# Patient Record
Sex: Female | Born: 1945 | Race: White | Hispanic: No | Marital: Married | Smoking: Never smoker
Health system: Southern US, Community
[De-identification: ages and names within clinical notes are randomized; demographics above are authoritative.]

## PROBLEM LIST (undated history)

## (undated) DIAGNOSIS — Z889 Allergy status to unspecified drugs, medicaments and biological substances status: Secondary | ICD-10-CM

## (undated) DIAGNOSIS — Z8489 Family history of other specified conditions: Secondary | ICD-10-CM

## (undated) DIAGNOSIS — K5792 Diverticulitis of intestine, part unspecified, without perforation or abscess without bleeding: Secondary | ICD-10-CM

## (undated) DIAGNOSIS — K7581 Nonalcoholic steatohepatitis (NASH): Secondary | ICD-10-CM

## (undated) DIAGNOSIS — K766 Portal hypertension: Secondary | ICD-10-CM

## (undated) DIAGNOSIS — I639 Cerebral infarction, unspecified: Secondary | ICD-10-CM

## (undated) DIAGNOSIS — M1990 Unspecified osteoarthritis, unspecified site: Secondary | ICD-10-CM

## (undated) DIAGNOSIS — J189 Pneumonia, unspecified organism: Secondary | ICD-10-CM

## (undated) DIAGNOSIS — Z9889 Other specified postprocedural states: Secondary | ICD-10-CM

## (undated) DIAGNOSIS — R112 Nausea with vomiting, unspecified: Secondary | ICD-10-CM

## (undated) DIAGNOSIS — I864 Gastric varices: Secondary | ICD-10-CM

## (undated) DIAGNOSIS — IMO0001 Reserved for inherently not codable concepts without codable children: Secondary | ICD-10-CM

## (undated) DIAGNOSIS — G459 Transient cerebral ischemic attack, unspecified: Secondary | ICD-10-CM

## (undated) DIAGNOSIS — K900 Celiac disease: Secondary | ICD-10-CM

## (undated) DIAGNOSIS — D759 Disease of blood and blood-forming organs, unspecified: Secondary | ICD-10-CM

## (undated) DIAGNOSIS — I209 Angina pectoris, unspecified: Secondary | ICD-10-CM

## (undated) DIAGNOSIS — I82409 Acute embolism and thrombosis of unspecified deep veins of unspecified lower extremity: Secondary | ICD-10-CM

## (undated) DIAGNOSIS — I8390 Asymptomatic varicose veins of unspecified lower extremity: Secondary | ICD-10-CM

## (undated) DIAGNOSIS — R011 Cardiac murmur, unspecified: Secondary | ICD-10-CM

## (undated) DIAGNOSIS — I8500 Esophageal varices without bleeding: Secondary | ICD-10-CM

## (undated) DIAGNOSIS — F419 Anxiety disorder, unspecified: Secondary | ICD-10-CM

## (undated) DIAGNOSIS — Z5189 Encounter for other specified aftercare: Secondary | ICD-10-CM

## (undated) DIAGNOSIS — I10 Essential (primary) hypertension: Secondary | ICD-10-CM

## (undated) DIAGNOSIS — K219 Gastro-esophageal reflux disease without esophagitis: Secondary | ICD-10-CM

## (undated) DIAGNOSIS — M797 Fibromyalgia: Secondary | ICD-10-CM

## (undated) DIAGNOSIS — D6861 Antiphospholipid syndrome: Secondary | ICD-10-CM

## (undated) DIAGNOSIS — F40240 Claustrophobia: Secondary | ICD-10-CM

## (undated) HISTORY — DX: Esophageal varices without bleeding: I85.00

## (undated) HISTORY — DX: Asymptomatic varicose veins of unspecified lower extremity: I83.90

## (undated) HISTORY — DX: Fibromyalgia: M79.7

## (undated) HISTORY — PX: COLON SURGERY: SHX602

## (undated) HISTORY — DX: Acute embolism and thrombosis of unspecified deep veins of unspecified lower extremity: I82.409

## (undated) HISTORY — DX: Portal hypertension: K76.6

## (undated) HISTORY — DX: Nonalcoholic steatohepatitis (NASH): K75.81

## (undated) HISTORY — PX: TONSILLECTOMY: SUR1361

## (undated) HISTORY — PX: ANKLE FRACTURE SURGERY: SHX122

## (undated) HISTORY — DX: Celiac disease: K90.0

## (undated) HISTORY — PX: CHOLECYSTECTOMY: SHX55

## (undated) HISTORY — DX: Antiphospholipid syndrome: D68.61

## (undated) HISTORY — PX: HERNIA REPAIR: SHX51

## (undated) HISTORY — PX: ABDOMINAL HYSTERECTOMY: SHX81

## (undated) HISTORY — PX: SPLENECTOMY, TOTAL: SHX788

## (undated) HISTORY — DX: Gastric varices: I86.4

---

## 1999-06-21 ENCOUNTER — Ambulatory Visit (HOSPITAL_COMMUNITY): Admission: RE | Admit: 1999-06-21 | Discharge: 1999-06-21 | Payer: Self-pay | Admitting: Gastroenterology

## 1999-09-20 ENCOUNTER — Encounter: Payer: Self-pay | Admitting: Hematology & Oncology

## 1999-09-20 ENCOUNTER — Encounter: Admission: RE | Admit: 1999-09-20 | Discharge: 1999-09-20 | Payer: Self-pay | Admitting: Hematology & Oncology

## 1999-09-23 ENCOUNTER — Ambulatory Visit (HOSPITAL_COMMUNITY): Admission: RE | Admit: 1999-09-23 | Discharge: 1999-09-23 | Payer: Self-pay | Admitting: Hematology & Oncology

## 1999-09-23 ENCOUNTER — Encounter (INDEPENDENT_AMBULATORY_CARE_PROVIDER_SITE_OTHER): Payer: Self-pay | Admitting: Specialist

## 1999-10-10 ENCOUNTER — Encounter: Payer: Self-pay | Admitting: Gastroenterology

## 1999-10-10 ENCOUNTER — Ambulatory Visit (HOSPITAL_COMMUNITY): Admission: RE | Admit: 1999-10-10 | Discharge: 1999-10-10 | Payer: Self-pay | Admitting: Gastroenterology

## 1999-10-25 ENCOUNTER — Ambulatory Visit (HOSPITAL_COMMUNITY): Admission: RE | Admit: 1999-10-25 | Discharge: 1999-10-25 | Payer: Self-pay | Admitting: Gastroenterology

## 1999-10-25 ENCOUNTER — Encounter: Payer: Self-pay | Admitting: Gastroenterology

## 1999-10-27 ENCOUNTER — Ambulatory Visit (HOSPITAL_COMMUNITY): Admission: RE | Admit: 1999-10-27 | Discharge: 1999-10-27 | Payer: Self-pay | Admitting: Gastroenterology

## 1999-10-27 ENCOUNTER — Encounter: Payer: Self-pay | Admitting: Gastroenterology

## 1999-10-27 ENCOUNTER — Encounter (INDEPENDENT_AMBULATORY_CARE_PROVIDER_SITE_OTHER): Payer: Self-pay | Admitting: *Deleted

## 1999-12-11 ENCOUNTER — Ambulatory Visit (HOSPITAL_COMMUNITY): Admission: RE | Admit: 1999-12-11 | Discharge: 1999-12-11 | Payer: Self-pay | Admitting: Hematology & Oncology

## 1999-12-13 ENCOUNTER — Encounter: Payer: Self-pay | Admitting: Hematology & Oncology

## 1999-12-13 ENCOUNTER — Encounter: Admission: RE | Admit: 1999-12-13 | Discharge: 1999-12-13 | Payer: Self-pay | Admitting: Hematology & Oncology

## 1999-12-18 ENCOUNTER — Ambulatory Visit (HOSPITAL_COMMUNITY): Admission: EM | Admit: 1999-12-18 | Discharge: 1999-12-18 | Payer: Self-pay

## 1999-12-19 ENCOUNTER — Ambulatory Visit (HOSPITAL_COMMUNITY): Admission: RE | Admit: 1999-12-19 | Discharge: 1999-12-19 | Payer: Self-pay | Admitting: Hematology & Oncology

## 1999-12-20 ENCOUNTER — Encounter: Admission: RE | Admit: 1999-12-20 | Discharge: 1999-12-20 | Payer: Self-pay | Admitting: Oncology

## 1999-12-20 ENCOUNTER — Encounter: Payer: Self-pay | Admitting: Hematology & Oncology

## 1999-12-22 ENCOUNTER — Ambulatory Visit: Admission: RE | Admit: 1999-12-22 | Discharge: 1999-12-22 | Payer: Self-pay | Admitting: Hematology & Oncology

## 1999-12-23 ENCOUNTER — Ambulatory Visit (HOSPITAL_COMMUNITY): Admission: RE | Admit: 1999-12-23 | Discharge: 1999-12-23 | Payer: Self-pay | Admitting: Hematology & Oncology

## 1999-12-23 ENCOUNTER — Encounter: Payer: Self-pay | Admitting: Hematology & Oncology

## 2000-01-27 ENCOUNTER — Encounter: Payer: Self-pay | Admitting: Gastroenterology

## 2000-01-27 ENCOUNTER — Ambulatory Visit (HOSPITAL_COMMUNITY): Admission: RE | Admit: 2000-01-27 | Discharge: 2000-01-27 | Payer: Self-pay | Admitting: Gastroenterology

## 2000-02-02 ENCOUNTER — Encounter: Payer: Self-pay | Admitting: Hematology & Oncology

## 2000-02-02 ENCOUNTER — Encounter: Admission: RE | Admit: 2000-02-02 | Discharge: 2000-02-02 | Payer: Self-pay | Admitting: Hematology & Oncology

## 2000-07-26 ENCOUNTER — Encounter: Payer: Self-pay | Admitting: Hematology & Oncology

## 2000-07-26 ENCOUNTER — Encounter: Admission: RE | Admit: 2000-07-26 | Discharge: 2000-07-26 | Payer: Self-pay | Admitting: Hematology & Oncology

## 2000-10-29 ENCOUNTER — Encounter: Payer: Self-pay | Admitting: Hematology & Oncology

## 2000-10-29 ENCOUNTER — Encounter: Admission: RE | Admit: 2000-10-29 | Discharge: 2000-10-29 | Payer: Self-pay | Admitting: Hematology & Oncology

## 2000-11-08 ENCOUNTER — Encounter: Admission: RE | Admit: 2000-11-08 | Discharge: 2000-11-08 | Payer: Self-pay | Admitting: Hematology & Oncology

## 2000-11-08 ENCOUNTER — Encounter: Payer: Self-pay | Admitting: Hematology & Oncology

## 2000-12-04 ENCOUNTER — Encounter: Payer: Self-pay | Admitting: Emergency Medicine

## 2000-12-04 ENCOUNTER — Emergency Department (HOSPITAL_COMMUNITY): Admission: EM | Admit: 2000-12-04 | Discharge: 2000-12-04 | Payer: Self-pay | Admitting: Emergency Medicine

## 2002-03-26 ENCOUNTER — Other Ambulatory Visit: Admission: RE | Admit: 2002-03-26 | Discharge: 2002-03-26 | Payer: Self-pay | Admitting: Obstetrics and Gynecology

## 2002-06-11 ENCOUNTER — Encounter: Payer: Self-pay | Admitting: Gastroenterology

## 2002-06-11 ENCOUNTER — Encounter: Admission: RE | Admit: 2002-06-11 | Discharge: 2002-06-11 | Payer: Self-pay | Admitting: Gastroenterology

## 2003-02-13 ENCOUNTER — Encounter: Admission: RE | Admit: 2003-02-13 | Discharge: 2003-02-13 | Payer: Self-pay | Admitting: Gastroenterology

## 2003-03-04 ENCOUNTER — Ambulatory Visit (HOSPITAL_COMMUNITY): Admission: RE | Admit: 2003-03-04 | Discharge: 2003-03-04 | Payer: Self-pay | Admitting: Hematology & Oncology

## 2003-11-20 ENCOUNTER — Ambulatory Visit (HOSPITAL_COMMUNITY): Admission: RE | Admit: 2003-11-20 | Discharge: 2003-11-20 | Payer: Self-pay | Admitting: Hematology & Oncology

## 2003-12-17 ENCOUNTER — Ambulatory Visit (HOSPITAL_COMMUNITY): Admission: RE | Admit: 2003-12-17 | Discharge: 2003-12-17 | Payer: Self-pay | Admitting: General Surgery

## 2004-01-22 ENCOUNTER — Inpatient Hospital Stay (HOSPITAL_COMMUNITY): Admission: RE | Admit: 2004-01-22 | Discharge: 2004-01-28 | Payer: Self-pay | Admitting: General Surgery

## 2004-01-22 ENCOUNTER — Encounter (INDEPENDENT_AMBULATORY_CARE_PROVIDER_SITE_OTHER): Payer: Self-pay | Admitting: Specialist

## 2004-02-18 ENCOUNTER — Ambulatory Visit: Payer: Self-pay | Admitting: Hematology & Oncology

## 2004-03-04 ENCOUNTER — Ambulatory Visit (HOSPITAL_BASED_OUTPATIENT_CLINIC_OR_DEPARTMENT_OTHER): Admission: RE | Admit: 2004-03-04 | Discharge: 2004-03-04 | Payer: Self-pay | Admitting: Orthopedic Surgery

## 2004-03-04 ENCOUNTER — Ambulatory Visit (HOSPITAL_COMMUNITY): Admission: RE | Admit: 2004-03-04 | Discharge: 2004-03-04 | Payer: Self-pay | Admitting: Orthopedic Surgery

## 2004-03-05 ENCOUNTER — Inpatient Hospital Stay (HOSPITAL_COMMUNITY): Admission: AD | Admit: 2004-03-05 | Discharge: 2004-03-06 | Payer: Self-pay | Admitting: Orthopedic Surgery

## 2004-04-07 ENCOUNTER — Ambulatory Visit: Payer: Self-pay | Admitting: Hematology & Oncology

## 2004-06-01 ENCOUNTER — Ambulatory Visit: Payer: Self-pay | Admitting: Hematology & Oncology

## 2004-06-08 ENCOUNTER — Ambulatory Visit (HOSPITAL_COMMUNITY): Admission: RE | Admit: 2004-06-08 | Discharge: 2004-06-08 | Payer: Self-pay | Admitting: Hematology & Oncology

## 2004-08-03 ENCOUNTER — Ambulatory Visit: Payer: Self-pay | Admitting: Hematology & Oncology

## 2004-08-17 ENCOUNTER — Ambulatory Visit (HOSPITAL_COMMUNITY): Admission: RE | Admit: 2004-08-17 | Discharge: 2004-08-17 | Payer: Self-pay | Admitting: Gastroenterology

## 2004-09-27 ENCOUNTER — Ambulatory Visit: Payer: Self-pay | Admitting: Hematology & Oncology

## 2004-11-28 ENCOUNTER — Ambulatory Visit (HOSPITAL_COMMUNITY): Admission: RE | Admit: 2004-11-28 | Discharge: 2004-11-28 | Payer: Self-pay | Admitting: Gastroenterology

## 2004-11-29 ENCOUNTER — Ambulatory Visit: Payer: Self-pay | Admitting: Hematology & Oncology

## 2005-01-17 ENCOUNTER — Ambulatory Visit: Payer: Self-pay | Admitting: Hematology & Oncology

## 2005-03-22 ENCOUNTER — Ambulatory Visit: Payer: Self-pay | Admitting: Hematology & Oncology

## 2005-04-20 ENCOUNTER — Encounter: Admission: RE | Admit: 2005-04-20 | Discharge: 2005-04-20 | Payer: Self-pay | Admitting: General Surgery

## 2005-05-16 ENCOUNTER — Ambulatory Visit: Payer: Self-pay | Admitting: Hematology & Oncology

## 2005-07-11 ENCOUNTER — Ambulatory Visit: Payer: Self-pay | Admitting: Hematology & Oncology

## 2005-08-09 LAB — PROTIME-INR
INR: 1.9 — ABNORMAL LOW (ref 2.00–3.50)
Protime: 17 Seconds — ABNORMAL HIGH (ref 10.6–13.4)

## 2005-08-28 ENCOUNTER — Emergency Department (HOSPITAL_COMMUNITY): Admission: EM | Admit: 2005-08-28 | Discharge: 2005-08-28 | Payer: Self-pay | Admitting: Emergency Medicine

## 2005-09-04 ENCOUNTER — Ambulatory Visit: Payer: Self-pay | Admitting: Hematology & Oncology

## 2005-09-06 LAB — CBC WITH DIFFERENTIAL/PLATELET
BASO%: 1 % (ref 0.0–2.0)
Basophils Absolute: 0.1 10*3/uL (ref 0.0–0.1)
EOS%: 2.2 % (ref 0.0–7.0)
Eosinophils Absolute: 0.1 10*3/uL (ref 0.0–0.5)
HCT: 43.9 % (ref 34.8–46.6)
HGB: 15 g/dL (ref 11.6–15.9)
LYMPH%: 37.5 % (ref 14.0–48.0)
MCH: 32.2 pg (ref 26.0–34.0)
MCHC: 34.1 g/dL (ref 32.0–36.0)
MCV: 94.5 fL (ref 81.0–101.0)
MONO#: 0.6 10*3/uL (ref 0.1–0.9)
MONO%: 9.7 % (ref 0.0–13.0)
NEUT#: 3.2 10*3/uL (ref 1.5–6.5)
NEUT%: 49.6 % (ref 39.6–76.8)
Platelets: 404 10*3/uL — ABNORMAL HIGH (ref 145–400)
RBC: 4.65 10*6/uL (ref 3.70–5.32)
RDW: 14.2 % (ref 11.3–14.5)
WBC: 6.5 10*3/uL (ref 3.9–10.0)
lymph#: 2.4 10*3/uL (ref 0.9–3.3)

## 2005-09-06 LAB — COMPREHENSIVE METABOLIC PANEL
ALT: 44 U/L — ABNORMAL HIGH (ref 0–40)
AST: 35 U/L (ref 0–37)
Albumin: 4.3 g/dL (ref 3.5–5.2)
Alkaline Phosphatase: 93 U/L (ref 39–117)
BUN: 12 mg/dL (ref 6–23)
CO2: 24 mEq/L (ref 19–32)
Calcium: 9.1 mg/dL (ref 8.4–10.5)
Chloride: 102 mEq/L (ref 96–112)
Creatinine, Ser: 0.7 mg/dL (ref 0.4–1.2)
Glucose, Bld: 104 mg/dL — ABNORMAL HIGH (ref 70–99)
Potassium: 3.8 mEq/L (ref 3.5–5.3)
Sodium: 141 mEq/L (ref 135–145)
Total Bilirubin: 1 mg/dL (ref 0.3–1.2)
Total Protein: 7.3 g/dL (ref 6.0–8.3)

## 2005-09-06 LAB — PROTIME-INR
INR: 2.6 (ref 2.00–3.50)
Protime: 19.7 Seconds — ABNORMAL HIGH (ref 10.6–13.4)

## 2005-10-04 LAB — PROTIME-INR
INR: 1.5 — ABNORMAL LOW (ref 2.00–3.50)
Protime: 15.3 Seconds — ABNORMAL HIGH (ref 10.6–13.4)

## 2005-10-26 ENCOUNTER — Ambulatory Visit: Payer: Self-pay | Admitting: Hematology & Oncology

## 2005-11-01 LAB — PROTIME-INR
INR: 3.6 — ABNORMAL HIGH (ref 2.00–3.50)
Protime: 22.8 Seconds — ABNORMAL HIGH (ref 10.6–13.4)

## 2005-11-29 LAB — CBC WITH DIFFERENTIAL/PLATELET
BASO%: 1.8 % (ref 0.0–2.0)
Basophils Absolute: 0.1 10*3/uL (ref 0.0–0.1)
EOS%: 3.1 % (ref 0.0–7.0)
Eosinophils Absolute: 0.2 10*3/uL (ref 0.0–0.5)
HCT: 43.4 % (ref 34.8–46.6)
HGB: 15.2 g/dL (ref 11.6–15.9)
LYMPH%: 36 % (ref 14.0–48.0)
MCH: 31.9 pg (ref 26.0–34.0)
MCHC: 35 g/dL (ref 32.0–36.0)
MCV: 91 fL (ref 81.0–101.0)
MONO#: 0.8 10*3/uL (ref 0.1–0.9)
MONO%: 12.9 % (ref 0.0–13.0)
NEUT#: 2.9 10*3/uL (ref 1.5–6.5)
NEUT%: 46.1 % (ref 39.6–76.8)
Platelets: 408 10*3/uL — ABNORMAL HIGH (ref 145–400)
RBC: 4.77 10*6/uL (ref 3.70–5.32)
RDW: 12 % (ref 11.3–14.5)
WBC: 6.3 10*3/uL (ref 3.9–10.0)
lymph#: 2.3 10*3/uL (ref 0.9–3.3)

## 2005-11-29 LAB — PROTIME-INR
INR: 3.1 (ref 2.00–3.50)
Protime: 37.2 Seconds — ABNORMAL HIGH (ref 10.6–13.4)

## 2005-11-29 LAB — CHCC SMEAR

## 2005-12-28 ENCOUNTER — Encounter: Admission: RE | Admit: 2005-12-28 | Discharge: 2005-12-28 | Payer: Self-pay | Admitting: Family Medicine

## 2006-01-02 ENCOUNTER — Ambulatory Visit: Payer: Self-pay | Admitting: Hematology & Oncology

## 2006-01-03 LAB — PROTIME-INR
INR: 2.1 (ref 2.00–3.50)
Protime: 25.2 Seconds — ABNORMAL HIGH (ref 10.6–13.4)

## 2006-01-30 ENCOUNTER — Emergency Department (HOSPITAL_COMMUNITY): Admission: EM | Admit: 2006-01-30 | Discharge: 2006-01-30 | Payer: Self-pay | Admitting: Emergency Medicine

## 2006-01-31 LAB — PROTIME-INR
INR: 1.6 — ABNORMAL LOW (ref 2.00–3.50)
Protime: 19.2 Seconds — ABNORMAL HIGH (ref 10.6–13.4)

## 2006-02-26 ENCOUNTER — Ambulatory Visit: Payer: Self-pay | Admitting: Hematology & Oncology

## 2006-02-28 LAB — CBC WITH DIFFERENTIAL/PLATELET
BASO%: 1.1 % (ref 0.0–2.0)
Basophils Absolute: 0.1 10*3/uL (ref 0.0–0.1)
EOS%: 3.9 % (ref 0.0–7.0)
Eosinophils Absolute: 0.3 10*3/uL (ref 0.0–0.5)
HCT: 43 % (ref 34.8–46.6)
HGB: 14.8 g/dL (ref 11.6–15.9)
LYMPH%: 34.4 % (ref 14.0–48.0)
MCH: 31.6 pg (ref 26.0–34.0)
MCHC: 34.4 g/dL (ref 32.0–36.0)
MCV: 91.9 fL (ref 81.0–101.0)
MONO#: 0.7 10*3/uL (ref 0.1–0.9)
MONO%: 9.6 % (ref 0.0–13.0)
NEUT#: 3.7 10*3/uL (ref 1.5–6.5)
NEUT%: 51.1 % (ref 39.6–76.8)
Platelets: 370 10*3/uL (ref 145–400)
RBC: 4.68 10*6/uL (ref 3.70–5.32)
RDW: 11.7 % (ref 11.3–14.5)
WBC: 7.3 10*3/uL (ref 3.9–10.0)
lymph#: 2.5 10*3/uL (ref 0.9–3.3)

## 2006-02-28 LAB — COMPREHENSIVE METABOLIC PANEL
ALT: 20 U/L (ref 0–35)
AST: 20 U/L (ref 0–37)
Albumin: 4.2 g/dL (ref 3.5–5.2)
Alkaline Phosphatase: 83 U/L (ref 39–117)
BUN: 21 mg/dL (ref 6–23)
CO2: 26 mEq/L (ref 19–32)
Calcium: 9.4 mg/dL (ref 8.4–10.5)
Chloride: 106 mEq/L (ref 96–112)
Creatinine, Ser: 0.86 mg/dL (ref 0.40–1.20)
Glucose, Bld: 119 mg/dL — ABNORMAL HIGH (ref 70–99)
Potassium: 3.9 mEq/L (ref 3.5–5.3)
Sodium: 141 mEq/L (ref 135–145)
Total Bilirubin: 0.8 mg/dL (ref 0.3–1.2)
Total Protein: 6.8 g/dL (ref 6.0–8.3)

## 2006-02-28 LAB — LACTATE DEHYDROGENASE: LDH: 180 U/L (ref 94–250)

## 2006-02-28 LAB — PROTIME-INR
INR: 1.7 — ABNORMAL LOW (ref 2.00–3.50)
Protime: 20.4 Seconds — ABNORMAL HIGH (ref 10.6–13.4)

## 2006-03-28 LAB — PROTIME-INR
INR: 2 (ref 2.00–3.50)
Protime: 24 Seconds — ABNORMAL HIGH (ref 10.6–13.4)

## 2006-04-23 ENCOUNTER — Ambulatory Visit: Payer: Self-pay | Admitting: Hematology & Oncology

## 2006-04-25 LAB — PROTIME-INR
INR: 1.8 — ABNORMAL LOW (ref 2.00–3.50)
Protime: 21.6 Seconds — ABNORMAL HIGH (ref 10.6–13.4)

## 2006-05-30 LAB — CBC WITH DIFFERENTIAL/PLATELET
BASO%: 1.5 % (ref 0.0–2.0)
Basophils Absolute: 0.1 10*3/uL (ref 0.0–0.1)
EOS%: 2.7 % (ref 0.0–7.0)
Eosinophils Absolute: 0.2 10*3/uL (ref 0.0–0.5)
HCT: 42.1 % (ref 34.8–46.6)
HGB: 15.3 g/dL (ref 11.6–15.9)
LYMPH%: 32.7 % (ref 14.0–48.0)
MCH: 32.9 pg (ref 26.0–34.0)
MCHC: 36.3 g/dL — ABNORMAL HIGH (ref 32.0–36.0)
MCV: 90.5 fL (ref 81.0–101.0)
MONO#: 0.7 10*3/uL (ref 0.1–0.9)
MONO%: 9.3 % (ref 0.0–13.0)
NEUT#: 4 10*3/uL (ref 1.5–6.5)
NEUT%: 53.9 % (ref 39.6–76.8)
Platelets: 426 10*3/uL — ABNORMAL HIGH (ref 145–400)
RBC: 4.65 10*6/uL (ref 3.70–5.32)
RDW: 11 % — ABNORMAL LOW (ref 11.3–14.5)
WBC: 7.4 10*3/uL (ref 3.9–10.0)
lymph#: 2.4 10*3/uL (ref 0.9–3.3)

## 2006-05-30 LAB — COMPREHENSIVE METABOLIC PANEL
ALT: 23 U/L (ref 0–35)
AST: 21 U/L (ref 0–37)
Albumin: 4.4 g/dL (ref 3.5–5.2)
Alkaline Phosphatase: 82 U/L (ref 39–117)
BUN: 15 mg/dL (ref 6–23)
CO2: 28 mEq/L (ref 19–32)
Calcium: 9.5 mg/dL (ref 8.4–10.5)
Chloride: 104 mEq/L (ref 96–112)
Creatinine, Ser: 0.71 mg/dL (ref 0.40–1.20)
Glucose, Bld: 108 mg/dL — ABNORMAL HIGH (ref 70–99)
Potassium: 3.9 mEq/L (ref 3.5–5.3)
Sodium: 143 mEq/L (ref 135–145)
Total Bilirubin: 0.8 mg/dL (ref 0.3–1.2)
Total Protein: 7.1 g/dL (ref 6.0–8.3)

## 2006-05-30 LAB — PROTIME-INR
INR: 2.2 (ref 2.00–3.50)
Protime: 26.4 Seconds — ABNORMAL HIGH (ref 10.6–13.4)

## 2006-06-25 ENCOUNTER — Ambulatory Visit: Payer: Self-pay | Admitting: Hematology & Oncology

## 2006-06-27 LAB — PROTIME-INR
INR: 1.9 — ABNORMAL LOW (ref 2.00–3.50)
Protime: 22.8 Seconds — ABNORMAL HIGH (ref 10.6–13.4)

## 2006-07-25 LAB — PROTIME-INR
INR: 2.1 (ref 2.00–3.50)
Protime: 25.2 Seconds — ABNORMAL HIGH (ref 10.6–13.4)

## 2006-08-27 ENCOUNTER — Ambulatory Visit: Payer: Self-pay | Admitting: Hematology & Oncology

## 2006-08-29 LAB — CBC WITH DIFFERENTIAL/PLATELET
BASO%: 1.2 % (ref 0.0–2.0)
Basophils Absolute: 0.1 10*3/uL (ref 0.0–0.1)
EOS%: 2.6 % (ref 0.0–7.0)
Eosinophils Absolute: 0.2 10*3/uL (ref 0.0–0.5)
HCT: 43.8 % (ref 34.8–46.6)
HGB: 15.3 g/dL (ref 11.6–15.9)
LYMPH%: 30.9 % (ref 14.0–48.0)
MCH: 32.5 pg (ref 26.0–34.0)
MCHC: 34.9 g/dL (ref 32.0–36.0)
MCV: 93 fL (ref 81.0–101.0)
MONO#: 0.7 10*3/uL (ref 0.1–0.9)
MONO%: 10.7 % (ref 0.0–13.0)
NEUT#: 3.8 10*3/uL (ref 1.5–6.5)
NEUT%: 54.6 % (ref 39.6–76.8)
Platelets: 353 10*3/uL (ref 145–400)
RBC: 4.71 10*6/uL (ref 3.70–5.32)
RDW: 13.3 % (ref 11.3–14.5)
WBC: 6.9 10*3/uL (ref 3.9–10.0)
lymph#: 2.1 10*3/uL (ref 0.9–3.3)

## 2006-08-29 LAB — COMPREHENSIVE METABOLIC PANEL
ALT: 24 U/L (ref 0–35)
AST: 28 U/L (ref 0–37)
Albumin: 4.4 g/dL (ref 3.5–5.2)
Alkaline Phosphatase: 82 U/L (ref 39–117)
BUN: 20 mg/dL (ref 6–23)
CO2: 27 mEq/L (ref 19–32)
Calcium: 9.5 mg/dL (ref 8.4–10.5)
Chloride: 104 mEq/L (ref 96–112)
Creatinine, Ser: 0.75 mg/dL (ref 0.40–1.20)
Glucose, Bld: 112 mg/dL — ABNORMAL HIGH (ref 70–99)
Potassium: 3.8 mEq/L (ref 3.5–5.3)
Sodium: 142 mEq/L (ref 135–145)
Total Bilirubin: 0.9 mg/dL (ref 0.3–1.2)
Total Protein: 7.2 g/dL (ref 6.0–8.3)

## 2006-08-29 LAB — PROTIME-INR
INR: 2.1 (ref 2.00–3.50)
Protime: 25.2 Seconds — ABNORMAL HIGH (ref 10.6–13.4)

## 2006-10-29 ENCOUNTER — Ambulatory Visit: Payer: Self-pay | Admitting: Hematology & Oncology

## 2006-10-31 LAB — CBC WITH DIFFERENTIAL/PLATELET
BASO%: 0.8 % (ref 0.0–2.0)
Basophils Absolute: 0.1 10*3/uL (ref 0.0–0.1)
EOS%: 2.9 % (ref 0.0–7.0)
Eosinophils Absolute: 0.2 10*3/uL (ref 0.0–0.5)
HCT: 42.9 % (ref 34.8–46.6)
HGB: 15.6 g/dL (ref 11.6–15.9)
LYMPH%: 37.9 % (ref 14.0–48.0)
MCH: 32.9 pg (ref 26.0–34.0)
MCHC: 36.3 g/dL — ABNORMAL HIGH (ref 32.0–36.0)
MCV: 90.6 fL (ref 81.0–101.0)
MONO#: 0.5 10*3/uL (ref 0.1–0.9)
MONO%: 8.1 % (ref 0.0–13.0)
NEUT#: 3.1 10*3/uL (ref 1.5–6.5)
NEUT%: 50.4 % (ref 39.6–76.8)
Platelets: 307 10*3/uL (ref 145–400)
RBC: 4.73 10*6/uL (ref 3.70–5.32)
RDW: 11.1 % — ABNORMAL LOW (ref 11.3–14.5)
WBC: 6.1 10*3/uL (ref 3.9–10.0)
lymph#: 2.3 10*3/uL (ref 0.9–3.3)

## 2006-10-31 LAB — PROTIME-INR
INR: 2.4 (ref 2.00–3.50)
Protime: 28.8 Seconds — ABNORMAL HIGH (ref 10.6–13.4)

## 2006-12-31 ENCOUNTER — Ambulatory Visit: Payer: Self-pay | Admitting: Hematology & Oncology

## 2007-01-02 LAB — CBC WITH DIFFERENTIAL/PLATELET
BASO%: 1 % (ref 0.0–2.0)
Basophils Absolute: 0.1 10*3/uL (ref 0.0–0.1)
EOS%: 3.2 % (ref 0.0–7.0)
Eosinophils Absolute: 0.3 10*3/uL (ref 0.0–0.5)
HCT: 45.1 % (ref 34.8–46.6)
HGB: 16.5 g/dL — ABNORMAL HIGH (ref 11.6–15.9)
LYMPH%: 35.3 % (ref 14.0–48.0)
MCH: 33 pg (ref 26.0–34.0)
MCHC: 36.6 g/dL — ABNORMAL HIGH (ref 32.0–36.0)
MCV: 90.1 fL (ref 81.0–101.0)
MONO#: 1 10*3/uL — ABNORMAL HIGH (ref 0.1–0.9)
MONO%: 11.8 % (ref 0.0–13.0)
NEUT#: 4 10*3/uL (ref 1.5–6.5)
NEUT%: 48.6 % (ref 39.6–76.8)
Platelets: 320 10*3/uL (ref 145–400)
RBC: 5 10*6/uL (ref 3.70–5.32)
RDW: 10.6 % — ABNORMAL LOW (ref 11.3–14.5)
WBC: 8.3 10*3/uL (ref 3.9–10.0)
lymph#: 2.9 10*3/uL (ref 0.9–3.3)

## 2007-01-02 LAB — PROTIME-INR
INR: 1.8 — ABNORMAL LOW (ref 2.00–3.50)
Protime: 21.6 Seconds — ABNORMAL HIGH (ref 10.6–13.4)

## 2007-01-04 ENCOUNTER — Ambulatory Visit (HOSPITAL_COMMUNITY): Admission: RE | Admit: 2007-01-04 | Discharge: 2007-01-04 | Payer: Self-pay | Admitting: Hematology & Oncology

## 2007-01-15 LAB — CMP AND LIVER
ALT: 25 U/L (ref 0–35)
AST: 23 U/L (ref 0–37)
Albumin: 4.3 g/dL (ref 3.5–5.2)
Alkaline Phosphatase: 81 U/L (ref 39–117)
BUN: 16 mg/dL (ref 6–23)
Bilirubin, Direct: 0.2 mg/dL (ref 0.0–0.3)
CO2: 27 mEq/L (ref 19–32)
Calcium: 9.6 mg/dL (ref 8.4–10.5)
Chloride: 103 mEq/L (ref 96–112)
Creatinine, Ser: 0.86 mg/dL (ref 0.40–1.20)
Glucose, Bld: 102 mg/dL — ABNORMAL HIGH (ref 70–99)
Indirect Bilirubin: 0.9 mg/dL (ref 0.0–0.9)
Potassium: 4.1 mEq/L (ref 3.5–5.3)
Sodium: 144 mEq/L (ref 135–145)
Total Bilirubin: 1.1 mg/dL (ref 0.3–1.2)
Total Protein: 7.1 g/dL (ref 6.0–8.3)

## 2007-01-15 LAB — CBC WITH DIFFERENTIAL/PLATELET
BASO%: 0.8 % (ref 0.0–2.0)
Basophils Absolute: 0.1 10*3/uL (ref 0.0–0.1)
EOS%: 2.6 % (ref 0.0–7.0)
Eosinophils Absolute: 0.2 10*3/uL (ref 0.0–0.5)
HCT: 44.4 % (ref 34.8–46.6)
HGB: 15.7 g/dL (ref 11.6–15.9)
LYMPH%: 28.9 % (ref 14.0–48.0)
MCH: 33 pg (ref 26.0–34.0)
MCHC: 35.3 g/dL (ref 32.0–36.0)
MCV: 93.5 fL (ref 81.0–101.0)
MONO#: 0.6 10*3/uL (ref 0.1–0.9)
MONO%: 8 % (ref 0.0–13.0)
NEUT#: 4.2 10*3/uL (ref 1.5–6.5)
NEUT%: 59.7 % (ref 39.6–76.8)
Platelets: 367 10*3/uL (ref 145–400)
RBC: 4.75 10*6/uL (ref 3.70–5.32)
RDW: 13.1 % (ref 11.3–14.5)
WBC: 7.1 10*3/uL (ref 3.9–10.0)
lymph#: 2.1 10*3/uL (ref 0.9–3.3)

## 2007-01-15 LAB — PROTHROMBIN TIME
INR: 1.6 — ABNORMAL HIGH (ref 0.0–1.5)
Prothrombin Time: 19.1 seconds — ABNORMAL HIGH (ref 11.6–15.2)

## 2007-01-16 ENCOUNTER — Encounter: Admission: RE | Admit: 2007-01-16 | Discharge: 2007-01-16 | Payer: Self-pay | Admitting: Hematology & Oncology

## 2007-02-04 ENCOUNTER — Encounter: Admission: RE | Admit: 2007-02-04 | Discharge: 2007-02-04 | Payer: Self-pay | Admitting: Otolaryngology

## 2007-03-01 ENCOUNTER — Ambulatory Visit: Payer: Self-pay | Admitting: Hematology & Oncology

## 2007-03-06 LAB — PROTIME-INR
INR: 2 (ref 2.00–3.50)
Protime: 24 Seconds — ABNORMAL HIGH (ref 10.6–13.4)

## 2007-03-06 LAB — CBC WITH DIFFERENTIAL/PLATELET
BASO%: 1.2 % (ref 0.0–2.0)
Basophils Absolute: 0.1 10*3/uL (ref 0.0–0.1)
EOS%: 1.3 % (ref 0.0–7.0)
Eosinophils Absolute: 0.1 10*3/uL (ref 0.0–0.5)
HCT: 43.7 % (ref 34.8–46.6)
HGB: 15.6 g/dL (ref 11.6–15.9)
LYMPH%: 31.9 % (ref 14.0–48.0)
MCH: 33.4 pg (ref 26.0–34.0)
MCHC: 35.7 g/dL (ref 32.0–36.0)
MCV: 93.5 fL (ref 81.0–101.0)
MONO#: 0.6 10*3/uL (ref 0.1–0.9)
MONO%: 7.8 % (ref 0.0–13.0)
NEUT#: 4.6 10*3/uL (ref 1.5–6.5)
NEUT%: 57.8 % (ref 39.6–76.8)
Platelets: 460 10*3/uL — ABNORMAL HIGH (ref 145–400)
RBC: 4.68 10*6/uL (ref 3.70–5.32)
RDW: 13.1 % (ref 11.3–14.5)
WBC: 8 10*3/uL (ref 3.9–10.0)
lymph#: 2.5 10*3/uL (ref 0.9–3.3)

## 2007-03-06 LAB — COMPREHENSIVE METABOLIC PANEL
ALT: 38 U/L — ABNORMAL HIGH (ref 0–35)
AST: 35 U/L (ref 0–37)
Albumin: 4.4 g/dL (ref 3.5–5.2)
Alkaline Phosphatase: 115 U/L (ref 39–117)
BUN: 22 mg/dL (ref 6–23)
CO2: 25 mEq/L (ref 19–32)
Calcium: 9.2 mg/dL (ref 8.4–10.5)
Chloride: 102 mEq/L (ref 96–112)
Creatinine, Ser: 0.77 mg/dL (ref 0.40–1.20)
Glucose, Bld: 110 mg/dL — ABNORMAL HIGH (ref 70–99)
Potassium: 4.1 mEq/L (ref 3.5–5.3)
Sodium: 140 mEq/L (ref 135–145)
Total Bilirubin: 1.1 mg/dL (ref 0.3–1.2)
Total Protein: 7.4 g/dL (ref 6.0–8.3)

## 2007-03-07 ENCOUNTER — Ambulatory Visit: Payer: Self-pay | Admitting: Surgery

## 2007-04-03 ENCOUNTER — Encounter: Admission: RE | Admit: 2007-04-03 | Discharge: 2007-04-03 | Payer: Self-pay | Admitting: Gastroenterology

## 2007-04-29 ENCOUNTER — Ambulatory Visit: Payer: Self-pay | Admitting: Hematology & Oncology

## 2007-05-01 LAB — CBC WITH DIFFERENTIAL/PLATELET
BASO%: 0.4 % (ref 0.0–2.0)
Basophils Absolute: 0 10*3/uL (ref 0.0–0.1)
EOS%: 2.3 % (ref 0.0–7.0)
Eosinophils Absolute: 0.2 10*3/uL (ref 0.0–0.5)
HCT: 44.4 % (ref 34.8–46.6)
HGB: 15.4 g/dL (ref 11.6–15.9)
LYMPH%: 29.8 % (ref 14.0–48.0)
MCH: 32.7 pg (ref 26.0–34.0)
MCHC: 34.6 g/dL (ref 32.0–36.0)
MCV: 94.5 fL (ref 81.0–101.0)
MONO#: 0.8 10*3/uL (ref 0.1–0.9)
MONO%: 10.6 % (ref 0.0–13.0)
NEUT#: 4.1 10*3/uL (ref 1.5–6.5)
NEUT%: 56.9 % (ref 39.6–76.8)
Platelets: 383 10*3/uL (ref 145–400)
RBC: 4.7 10*6/uL (ref 3.70–5.32)
RDW: 12.8 % (ref 11.3–14.5)
WBC: 7.2 10*3/uL (ref 3.9–10.0)
lymph#: 2.1 10*3/uL (ref 0.9–3.3)

## 2007-05-01 LAB — COMPREHENSIVE METABOLIC PANEL
ALT: 20 U/L (ref 0–35)
AST: 24 U/L (ref 0–37)
Albumin: 4.6 g/dL (ref 3.5–5.2)
Alkaline Phosphatase: 107 U/L (ref 39–117)
BUN: 15 mg/dL (ref 6–23)
CO2: 28 mEq/L (ref 19–32)
Calcium: 9.9 mg/dL (ref 8.4–10.5)
Chloride: 102 mEq/L (ref 96–112)
Creatinine, Ser: 0.79 mg/dL (ref 0.40–1.20)
Glucose, Bld: 103 mg/dL — ABNORMAL HIGH (ref 70–99)
Potassium: 4.1 mEq/L (ref 3.5–5.3)
Sodium: 142 mEq/L (ref 135–145)
Total Bilirubin: 0.9 mg/dL (ref 0.3–1.2)
Total Protein: 7.6 g/dL (ref 6.0–8.3)

## 2007-05-01 LAB — PROTIME-INR
INR: 1.7 — ABNORMAL LOW (ref 2.00–3.50)
Protime: 20.4 Seconds — ABNORMAL HIGH (ref 10.6–13.4)

## 2007-06-05 LAB — PROTIME-INR
INR: 1.8 — ABNORMAL LOW (ref 2.00–3.50)
Protime: 21.6 Seconds — ABNORMAL HIGH (ref 10.6–13.4)

## 2007-06-17 ENCOUNTER — Ambulatory Visit: Payer: Self-pay | Admitting: Hematology & Oncology

## 2007-06-19 LAB — PROTIME-INR
INR: 2.1 (ref 2.00–3.50)
Protime: 25.2 Seconds — ABNORMAL HIGH (ref 10.6–13.4)

## 2007-08-05 ENCOUNTER — Ambulatory Visit: Payer: Self-pay | Admitting: Hematology & Oncology

## 2007-08-07 LAB — CBC WITH DIFFERENTIAL/PLATELET
BASO%: 3.7 % — ABNORMAL HIGH (ref 0.0–2.0)
Basophils Absolute: 0.2 10*3/uL — ABNORMAL HIGH (ref 0.0–0.1)
EOS%: 3.3 % (ref 0.0–7.0)
Eosinophils Absolute: 0.2 10*3/uL (ref 0.0–0.5)
HCT: 45.3 % (ref 34.8–46.6)
HGB: 15.8 g/dL (ref 11.6–15.9)
LYMPH%: 30.8 % (ref 14.0–48.0)
MCH: 32.6 pg (ref 26.0–34.0)
MCHC: 34.9 g/dL (ref 32.0–36.0)
MCV: 93.4 fL (ref 81.0–101.0)
MONO#: 0.6 10*3/uL (ref 0.1–0.9)
MONO%: 8.7 % (ref 0.0–13.0)
NEUT#: 3.5 10*3/uL (ref 1.5–6.5)
NEUT%: 53.5 % (ref 39.6–76.8)
Platelets: 337 10*3/uL (ref 145–400)
RBC: 4.84 10*6/uL (ref 3.70–5.32)
RDW: 13.3 % (ref 11.3–14.5)
WBC: 6.6 10*3/uL (ref 3.9–10.0)
lymph#: 2 10*3/uL (ref 0.9–3.3)

## 2007-08-07 LAB — COMPREHENSIVE METABOLIC PANEL
ALT: 28 U/L (ref 0–35)
AST: 25 U/L (ref 0–37)
Albumin: 4.6 g/dL (ref 3.5–5.2)
Alkaline Phosphatase: 86 U/L (ref 39–117)
BUN: 23 mg/dL (ref 6–23)
CO2: 28 mEq/L (ref 19–32)
Calcium: 9.9 mg/dL (ref 8.4–10.5)
Chloride: 102 mEq/L (ref 96–112)
Creatinine, Ser: 0.82 mg/dL (ref 0.40–1.20)
Glucose, Bld: 114 mg/dL — ABNORMAL HIGH (ref 70–99)
Potassium: 4.2 mEq/L (ref 3.5–5.3)
Sodium: 141 mEq/L (ref 135–145)
Total Bilirubin: 1.2 mg/dL (ref 0.3–1.2)
Total Protein: 7.7 g/dL (ref 6.0–8.3)

## 2007-08-07 LAB — PROTIME-INR
INR: 1.8 — ABNORMAL LOW (ref 2.00–3.50)
Protime: 21.6 Seconds — ABNORMAL HIGH (ref 10.6–13.4)

## 2007-08-13 ENCOUNTER — Ambulatory Visit (HOSPITAL_COMMUNITY): Admission: RE | Admit: 2007-08-13 | Discharge: 2007-08-13 | Payer: Self-pay | Admitting: Hematology & Oncology

## 2007-08-13 ENCOUNTER — Ambulatory Visit: Payer: Self-pay | Admitting: Vascular Surgery

## 2007-08-13 ENCOUNTER — Encounter: Payer: Self-pay | Admitting: Hematology & Oncology

## 2007-09-02 ENCOUNTER — Encounter
Admission: RE | Admit: 2007-09-02 | Discharge: 2007-09-02 | Payer: Self-pay | Admitting: Physical Medicine and Rehabilitation

## 2007-09-30 ENCOUNTER — Ambulatory Visit: Payer: Self-pay | Admitting: Hematology & Oncology

## 2007-10-02 LAB — PROTIME-INR
INR: 3.7 — ABNORMAL HIGH (ref 2.00–3.50)
Protime: 44.4 Seconds — ABNORMAL HIGH (ref 10.6–13.4)

## 2007-10-25 ENCOUNTER — Encounter: Payer: Self-pay | Admitting: Hematology & Oncology

## 2007-10-25 ENCOUNTER — Ambulatory Visit: Payer: Self-pay | Admitting: Surgery

## 2007-10-25 ENCOUNTER — Ambulatory Visit: Admission: RE | Admit: 2007-10-25 | Discharge: 2007-10-25 | Payer: Self-pay | Admitting: Hematology & Oncology

## 2007-10-25 LAB — COMPREHENSIVE METABOLIC PANEL
ALT: 38 U/L — ABNORMAL HIGH (ref 0–35)
AST: 39 U/L — ABNORMAL HIGH (ref 0–37)
Albumin: 4.7 g/dL (ref 3.5–5.2)
Alkaline Phosphatase: 94 U/L (ref 39–117)
BUN: 15 mg/dL (ref 6–23)
CO2: 27 mEq/L (ref 19–32)
Calcium: 9.6 mg/dL (ref 8.4–10.5)
Chloride: 102 mEq/L (ref 96–112)
Creatinine, Ser: 0.79 mg/dL (ref 0.40–1.20)
Glucose, Bld: 98 mg/dL (ref 70–99)
Potassium: 3.8 mEq/L (ref 3.5–5.3)
Sodium: 141 mEq/L (ref 135–145)
Total Bilirubin: 1 mg/dL (ref 0.3–1.2)
Total Protein: 7.5 g/dL (ref 6.0–8.3)

## 2007-10-25 LAB — CBC WITH DIFFERENTIAL/PLATELET
BASO%: 1.2 % (ref 0.0–2.0)
Basophils Absolute: 0.1 10*3/uL (ref 0.0–0.1)
EOS%: 1.8 % (ref 0.0–7.0)
Eosinophils Absolute: 0.1 10*3/uL (ref 0.0–0.5)
HCT: 45 % (ref 34.8–46.6)
HGB: 15.6 g/dL (ref 11.6–15.9)
LYMPH%: 32 % (ref 14.0–48.0)
MCH: 32.7 pg (ref 26.0–34.0)
MCHC: 34.8 g/dL (ref 32.0–36.0)
MCV: 94.2 fL (ref 81.0–101.0)
MONO#: 0.7 10*3/uL (ref 0.1–0.9)
MONO%: 10 % (ref 0.0–13.0)
NEUT#: 4 10*3/uL (ref 1.5–6.5)
NEUT%: 55 % (ref 39.6–76.8)
Platelets: 331 10*3/uL (ref 145–400)
RBC: 4.78 10*6/uL (ref 3.70–5.32)
RDW: 13 % (ref 11.3–14.5)
WBC: 7.4 10*3/uL (ref 3.9–10.0)
lymph#: 2.4 10*3/uL (ref 0.9–3.3)

## 2007-10-25 LAB — PROTIME-INR
INR: 2.5 (ref 2.00–3.50)
Protime: 30 Seconds — ABNORMAL HIGH (ref 10.6–13.4)

## 2007-10-29 ENCOUNTER — Ambulatory Visit: Payer: Self-pay | Admitting: Hematology & Oncology

## 2007-12-24 ENCOUNTER — Ambulatory Visit: Payer: Self-pay | Admitting: Hematology & Oncology

## 2007-12-25 LAB — MANUAL DIFFERENTIAL (CHCC SATELLITE)
ALC: 1.6 10*3/uL (ref 0.6–2.2)
ANC (CHCC HP manual diff): 4.5 10*3/uL (ref 1.5–6.7)
BASO: 1 % (ref 0–2)
Band Neutrophils: 3 % (ref 0–10)
Eos: 2 % (ref 0–7)
LYMPH: 21 % (ref 14–48)
MONO: 17 % — ABNORMAL HIGH (ref 0–13)
PLT EST ~~LOC~~: ADEQUATE
SEG: 56 % (ref 40–75)

## 2007-12-25 LAB — CBC WITH DIFFERENTIAL (CANCER CENTER ONLY)
HCT: 44.4 % (ref 34.8–46.6)
HGB: 15.5 g/dL (ref 11.6–15.9)
MCH: 32.6 pg (ref 26.0–34.0)
MCHC: 34.8 g/dL (ref 32.0–36.0)
MCV: 94 fL (ref 81–101)
Platelets: 362 10*3/uL (ref 145–400)
RBC: 4.75 10*6/uL (ref 3.70–5.32)
RDW: 11.7 % (ref 10.5–14.6)
WBC: 7.7 10*3/uL (ref 3.9–10.0)

## 2007-12-25 LAB — PROTIME-INR (CHCC SATELLITE)
INR: 3.1 (ref 2.0–3.5)
Protime: 37.2 Seconds — ABNORMAL HIGH (ref 10.6–13.4)

## 2008-01-06 ENCOUNTER — Emergency Department (HOSPITAL_BASED_OUTPATIENT_CLINIC_OR_DEPARTMENT_OTHER): Admission: EM | Admit: 2008-01-06 | Discharge: 2008-01-06 | Payer: Self-pay | Admitting: Emergency Medicine

## 2008-01-22 LAB — PROTIME-INR (CHCC SATELLITE)
INR: 3.5 (ref 2.0–3.5)
Protime: 42 Seconds — ABNORMAL HIGH (ref 10.6–13.4)

## 2008-02-02 ENCOUNTER — Encounter
Admission: RE | Admit: 2008-02-02 | Discharge: 2008-02-02 | Payer: Self-pay | Admitting: Physical Medicine and Rehabilitation

## 2008-02-25 ENCOUNTER — Ambulatory Visit: Payer: Self-pay | Admitting: Hematology & Oncology

## 2008-03-04 ENCOUNTER — Ambulatory Visit (HOSPITAL_BASED_OUTPATIENT_CLINIC_OR_DEPARTMENT_OTHER): Admission: RE | Admit: 2008-03-04 | Discharge: 2008-03-04 | Payer: Self-pay | Admitting: Hematology & Oncology

## 2008-03-04 LAB — PROTIME-INR (CHCC SATELLITE)
INR: 1.9 — ABNORMAL LOW (ref 2.0–3.5)
Protime: 22.8 Seconds — ABNORMAL HIGH (ref 10.6–13.4)

## 2008-03-05 ENCOUNTER — Ambulatory Visit (HOSPITAL_COMMUNITY): Admission: RE | Admit: 2008-03-05 | Discharge: 2008-03-05 | Payer: Self-pay | Admitting: Hematology & Oncology

## 2008-03-26 LAB — PROTIME-INR (CHCC SATELLITE)
INR: 1.8 — ABNORMAL LOW (ref 2.0–3.5)
Protime: 21.6 Seconds — ABNORMAL HIGH (ref 10.6–13.4)

## 2008-04-13 ENCOUNTER — Ambulatory Visit: Payer: Self-pay | Admitting: Hematology & Oncology

## 2008-04-13 LAB — PROTIME-INR (CHCC SATELLITE)
INR: 2.1 (ref 2.0–3.5)
Protime: 25.2 Seconds — ABNORMAL HIGH (ref 10.6–13.4)

## 2008-04-30 ENCOUNTER — Encounter: Admission: RE | Admit: 2008-04-30 | Discharge: 2008-04-30 | Payer: Self-pay | Admitting: Gastroenterology

## 2008-05-25 ENCOUNTER — Encounter: Admission: RE | Admit: 2008-05-25 | Discharge: 2008-05-25 | Payer: Self-pay | Admitting: Family Medicine

## 2008-05-29 ENCOUNTER — Ambulatory Visit: Payer: Self-pay | Admitting: Hematology & Oncology

## 2008-06-01 LAB — COMPREHENSIVE METABOLIC PANEL
ALT: 19 U/L (ref 0–35)
AST: 15 U/L (ref 0–37)
Albumin: 4.3 g/dL (ref 3.5–5.2)
Alkaline Phosphatase: 77 U/L (ref 39–117)
BUN: 22 mg/dL (ref 6–23)
CO2: 26 mEq/L (ref 19–32)
Calcium: 9.6 mg/dL (ref 8.4–10.5)
Chloride: 100 mEq/L (ref 96–112)
Creatinine, Ser: 0.67 mg/dL (ref 0.40–1.20)
Glucose, Bld: 81 mg/dL (ref 70–99)
Potassium: 3.6 mEq/L (ref 3.5–5.3)
Sodium: 139 mEq/L (ref 135–145)
Total Bilirubin: 1.4 mg/dL — ABNORMAL HIGH (ref 0.3–1.2)
Total Protein: 7 g/dL (ref 6.0–8.3)

## 2008-06-01 LAB — CBC WITH DIFFERENTIAL (CANCER CENTER ONLY)
BASO#: 0.2 10*3/uL (ref 0.0–0.2)
BASO%: 1.9 % (ref 0.0–2.0)
EOS%: 1.1 % (ref 0.0–7.0)
Eosinophils Absolute: 0.1 10*3/uL (ref 0.0–0.5)
HCT: 47.6 % — ABNORMAL HIGH (ref 34.8–46.6)
HGB: 15.7 g/dL (ref 11.6–15.9)
LYMPH#: 3.1 10*3/uL (ref 0.9–3.3)
LYMPH%: 31.5 % (ref 14.0–48.0)
MCH: 32.5 pg (ref 26.0–34.0)
MCHC: 33 g/dL (ref 32.0–36.0)
MCV: 99 fL (ref 81–101)
MONO#: 1.1 10*3/uL — ABNORMAL HIGH (ref 0.1–0.9)
MONO%: 10.8 % (ref 0.0–13.0)
NEUT#: 5.3 10*3/uL (ref 1.5–6.5)
NEUT%: 54.7 % (ref 39.6–80.0)
Platelets: 324 10*3/uL (ref 145–400)
RBC: 4.83 10*6/uL (ref 3.70–5.32)
RDW: 9.9 % — ABNORMAL LOW (ref 10.5–14.6)
WBC: 9.8 10*3/uL (ref 3.9–10.0)

## 2008-06-01 LAB — PROTIME-INR (CHCC SATELLITE)
INR: 1.1 — ABNORMAL LOW (ref 2.0–3.5)
Protime: 13.2 Seconds (ref 10.6–13.4)

## 2008-06-01 LAB — TECHNOLOGIST REVIEW CHCC SATELLITE: Tech Review: 1

## 2008-06-08 LAB — PROTIME-INR (CHCC SATELLITE)
INR: 1.9 — ABNORMAL LOW (ref 2.0–3.5)
Protime: 22.8 Seconds — ABNORMAL HIGH (ref 10.6–13.4)

## 2008-06-15 LAB — PROTIME-INR (CHCC SATELLITE)
INR: 2.9 (ref 2.0–3.5)
Protime: 34.8 Seconds — ABNORMAL HIGH (ref 10.6–13.4)

## 2008-06-18 ENCOUNTER — Ambulatory Visit: Payer: Self-pay | Admitting: Diagnostic Radiology

## 2008-06-18 ENCOUNTER — Emergency Department (HOSPITAL_BASED_OUTPATIENT_CLINIC_OR_DEPARTMENT_OTHER): Admission: EM | Admit: 2008-06-18 | Discharge: 2008-06-18 | Payer: Self-pay | Admitting: Emergency Medicine

## 2008-06-22 LAB — PROTIME-INR (CHCC SATELLITE)
INR: 2.9 (ref 2.0–3.5)
Protime: 34.8 Seconds — ABNORMAL HIGH (ref 10.6–13.4)

## 2008-07-13 LAB — PROTIME-INR (CHCC SATELLITE)
INR: 2.3 (ref 2.0–3.5)
Protime: 27.6 Seconds — ABNORMAL HIGH (ref 10.6–13.4)

## 2008-07-31 ENCOUNTER — Ambulatory Visit: Payer: Self-pay | Admitting: Hematology & Oncology

## 2008-08-03 LAB — CBC WITH DIFFERENTIAL (CANCER CENTER ONLY)
BASO#: 0.1 10*3/uL (ref 0.0–0.2)
BASO%: 1 % (ref 0.0–2.0)
EOS%: 3.7 % (ref 0.0–7.0)
Eosinophils Absolute: 0.2 10*3/uL (ref 0.0–0.5)
HCT: 43.3 % (ref 34.8–46.6)
HGB: 14.7 g/dL (ref 11.6–15.9)
LYMPH#: 2 10*3/uL (ref 0.9–3.3)
LYMPH%: 32.1 % (ref 14.0–48.0)
MCH: 32.3 pg (ref 26.0–34.0)
MCHC: 33.9 g/dL (ref 32.0–36.0)
MCV: 95 fL (ref 81–101)
MONO#: 0.5 10*3/uL (ref 0.1–0.9)
MONO%: 8.5 % (ref 0.0–13.0)
NEUT#: 3.4 10*3/uL (ref 1.5–6.5)
NEUT%: 54.7 % (ref 39.6–80.0)
Platelets: 290 10*3/uL (ref 145–400)
RBC: 4.54 10*6/uL (ref 3.70–5.32)
RDW: 11 % (ref 10.5–14.6)
WBC: 6.2 10*3/uL (ref 3.9–10.0)

## 2008-08-03 LAB — COMPREHENSIVE METABOLIC PANEL
ALT: 20 U/L (ref 0–35)
AST: 24 U/L (ref 0–37)
Albumin: 4.1 g/dL (ref 3.5–5.2)
Alkaline Phosphatase: 87 U/L (ref 39–117)
BUN: 13 mg/dL (ref 6–23)
CO2: 28 mEq/L (ref 19–32)
Calcium: 9.4 mg/dL (ref 8.4–10.5)
Chloride: 105 mEq/L (ref 96–112)
Creatinine, Ser: 0.78 mg/dL (ref 0.40–1.20)
Glucose, Bld: 138 mg/dL — ABNORMAL HIGH (ref 70–99)
Potassium: 3.9 mEq/L (ref 3.5–5.3)
Sodium: 143 mEq/L (ref 135–145)
Total Bilirubin: 1 mg/dL (ref 0.3–1.2)
Total Protein: 6.6 g/dL (ref 6.0–8.3)

## 2008-08-03 LAB — PROTIME-INR (CHCC SATELLITE)
INR: 3.4 (ref 2.0–3.5)
Protime: 40.8 Seconds — ABNORMAL HIGH (ref 10.6–13.4)

## 2008-08-03 LAB — TECHNOLOGIST REVIEW CHCC SATELLITE

## 2008-09-02 LAB — PROTIME-INR (CHCC SATELLITE)
INR: 2.1 (ref 2.0–3.5)
Protime: 25.2 Seconds — ABNORMAL HIGH (ref 10.6–13.4)

## 2008-09-25 ENCOUNTER — Ambulatory Visit: Payer: Self-pay | Admitting: Hematology & Oncology

## 2008-09-28 LAB — CBC WITH DIFFERENTIAL (CANCER CENTER ONLY)
BASO#: 0.1 10*3/uL (ref 0.0–0.2)
BASO%: 0.9 % (ref 0.0–2.0)
EOS%: 5.7 % (ref 0.0–7.0)
Eosinophils Absolute: 0.3 10*3/uL (ref 0.0–0.5)
HCT: 46 % (ref 34.8–46.6)
HGB: 15.6 g/dL (ref 11.6–15.9)
LYMPH#: 2.2 10*3/uL (ref 0.9–3.3)
LYMPH%: 42.8 % (ref 14.0–48.0)
MCH: 33.1 pg (ref 26.0–34.0)
MCHC: 33.9 g/dL (ref 32.0–36.0)
MCV: 98 fL (ref 81–101)
MONO#: 0.5 10*3/uL (ref 0.1–0.9)
MONO%: 10.5 % (ref 0.0–13.0)
NEUT#: 2 10*3/uL (ref 1.5–6.5)
NEUT%: 40.1 % (ref 39.6–80.0)
Platelets: 328 10*3/uL (ref 145–400)
RBC: 4.71 10*6/uL (ref 3.70–5.32)
RDW: 11.2 % (ref 10.5–14.6)
WBC: 5.1 10*3/uL (ref 3.9–10.0)

## 2008-09-28 LAB — CHCC SATELLITE - SMEAR

## 2008-09-28 LAB — PROTIME-INR (CHCC SATELLITE)
INR: 2.3 (ref 2.0–3.5)
Protime: 27.6 Seconds — ABNORMAL HIGH (ref 10.6–13.4)

## 2008-09-28 LAB — TECHNOLOGIST REVIEW CHCC SATELLITE

## 2008-10-26 ENCOUNTER — Ambulatory Visit: Payer: Self-pay | Admitting: Hematology & Oncology

## 2008-11-23 ENCOUNTER — Ambulatory Visit: Payer: Self-pay | Admitting: Diagnostic Radiology

## 2008-11-23 ENCOUNTER — Ambulatory Visit (HOSPITAL_BASED_OUTPATIENT_CLINIC_OR_DEPARTMENT_OTHER): Admission: RE | Admit: 2008-11-23 | Discharge: 2008-11-23 | Payer: Self-pay | Admitting: Hematology & Oncology

## 2008-11-23 LAB — CBC WITH DIFFERENTIAL (CANCER CENTER ONLY)
BASO#: 0.1 10*3/uL (ref 0.0–0.2)
BASO%: 1.8 % (ref 0.0–2.0)
EOS%: 4.7 % (ref 0.0–7.0)
Eosinophils Absolute: 0.3 10*3/uL (ref 0.0–0.5)
HCT: 44.5 % (ref 34.8–46.6)
HGB: 15.1 g/dL (ref 11.6–15.9)
LYMPH#: 2.3 10*3/uL (ref 0.9–3.3)
LYMPH%: 35.1 % (ref 14.0–48.0)
MCH: 32.9 pg (ref 26.0–34.0)
MCHC: 33.8 g/dL (ref 32.0–36.0)
MCV: 97 fL (ref 81–101)
MONO#: 0.6 10*3/uL (ref 0.1–0.9)
MONO%: 9.2 % (ref 0.0–13.0)
NEUT#: 3.2 10*3/uL (ref 1.5–6.5)
NEUT%: 49.2 % (ref 39.6–80.0)
Platelets: 360 10*3/uL (ref 145–400)
RBC: 4.58 10*6/uL (ref 3.70–5.32)
RDW: 11.9 % (ref 10.5–14.6)
WBC: 6.5 10*3/uL (ref 3.9–10.0)

## 2008-11-23 LAB — COMPREHENSIVE METABOLIC PANEL
ALT: 58 U/L — ABNORMAL HIGH (ref 0–35)
AST: 43 U/L — ABNORMAL HIGH (ref 0–37)
Albumin: 4.1 g/dL (ref 3.5–5.2)
Alkaline Phosphatase: 98 U/L (ref 39–117)
BUN: 14 mg/dL (ref 6–23)
CO2: 27 mEq/L (ref 19–32)
Calcium: 9.5 mg/dL (ref 8.4–10.5)
Chloride: 105 mEq/L (ref 96–112)
Creatinine, Ser: 0.73 mg/dL (ref 0.40–1.20)
Glucose, Bld: 117 mg/dL — ABNORMAL HIGH (ref 70–99)
Potassium: 4 mEq/L (ref 3.5–5.3)
Sodium: 141 mEq/L (ref 135–145)
Total Bilirubin: 1.2 mg/dL (ref 0.3–1.2)
Total Protein: 6.7 g/dL (ref 6.0–8.3)

## 2008-11-23 LAB — URINALYSIS, MICROSCOPIC (CHCC SATELLITE)
Bilirubin (Urine): NEGATIVE
Blood: NEGATIVE
Glucose: NEGATIVE g/dL
Ketones: NEGATIVE mg/dL
Nitrite: NEGATIVE
Protein: NEGATIVE mg/dL
RBC: NEGATIVE (ref 0–2)
Specific Gravity, Urine: 1.02 (ref 1.003–1.035)
pH: 6 (ref 4.60–8.00)

## 2008-11-23 LAB — TECHNOLOGIST REVIEW CHCC SATELLITE

## 2008-11-23 LAB — PROTIME-INR (CHCC SATELLITE)
INR: 3.2 (ref 2.0–3.5)
Protime: 38.4 Seconds — ABNORMAL HIGH (ref 10.6–13.4)

## 2008-12-24 ENCOUNTER — Ambulatory Visit (HOSPITAL_COMMUNITY): Admission: RE | Admit: 2008-12-24 | Discharge: 2008-12-24 | Payer: Self-pay | Admitting: Gastroenterology

## 2009-01-01 ENCOUNTER — Ambulatory Visit: Payer: Self-pay | Admitting: Hematology & Oncology

## 2009-01-04 LAB — PROTIME-INR (CHCC SATELLITE)
INR: 1.9 — ABNORMAL LOW (ref 2.0–3.5)
Protime: 22.8 Seconds — ABNORMAL HIGH (ref 10.6–13.4)

## 2009-01-29 ENCOUNTER — Ambulatory Visit: Payer: Self-pay | Admitting: Hematology & Oncology

## 2009-02-10 ENCOUNTER — Encounter: Admission: RE | Admit: 2009-02-10 | Discharge: 2009-02-10 | Payer: Self-pay | Admitting: Gastroenterology

## 2009-02-22 LAB — CBC WITH DIFFERENTIAL (CANCER CENTER ONLY)
BASO#: 0.1 10*3/uL (ref 0.0–0.2)
BASO%: 1 % (ref 0.0–2.0)
EOS%: 2.9 % (ref 0.0–7.0)
Eosinophils Absolute: 0.2 10*3/uL (ref 0.0–0.5)
HCT: 43.3 % (ref 34.8–46.6)
HGB: 15.3 g/dL (ref 11.6–15.9)
LYMPH#: 2.6 10*3/uL (ref 0.9–3.3)
LYMPH%: 41.2 % (ref 14.0–48.0)
MCH: 33.5 pg (ref 26.0–34.0)
MCHC: 35.2 g/dL (ref 32.0–36.0)
MCV: 95 fL (ref 81–101)
MONO#: 0.6 10*3/uL (ref 0.1–0.9)
MONO%: 10 % (ref 0.0–13.0)
NEUT#: 2.8 10*3/uL (ref 1.5–6.5)
NEUT%: 44.9 % (ref 39.6–80.0)
Platelets: 434 10*3/uL — ABNORMAL HIGH (ref 145–400)
RBC: 4.55 10*6/uL (ref 3.70–5.32)
RDW: 11.5 % (ref 10.5–14.6)
WBC: 6.3 10*3/uL (ref 3.9–10.0)

## 2009-02-22 LAB — PROTIME-INR (CHCC SATELLITE)
INR: 1.4 — ABNORMAL LOW (ref 2.0–3.5)
Protime: 16.8 Seconds — ABNORMAL HIGH (ref 10.6–13.4)

## 2009-02-22 LAB — TECHNOLOGIST REVIEW CHCC SATELLITE

## 2009-02-23 LAB — COMPREHENSIVE METABOLIC PANEL
ALT: 43 U/L — ABNORMAL HIGH (ref 0–35)
AST: 38 U/L — ABNORMAL HIGH (ref 0–37)
Albumin: 4.3 g/dL (ref 3.5–5.2)
Alkaline Phosphatase: 120 U/L — ABNORMAL HIGH (ref 39–117)
BUN: 17 mg/dL (ref 6–23)
CO2: 23 mEq/L (ref 19–32)
Calcium: 9.7 mg/dL (ref 8.4–10.5)
Chloride: 106 mEq/L (ref 96–112)
Creatinine, Ser: 0.74 mg/dL (ref 0.40–1.20)
Glucose, Bld: 107 mg/dL — ABNORMAL HIGH (ref 70–99)
Potassium: 4.2 mEq/L (ref 3.5–5.3)
Sodium: 142 mEq/L (ref 135–145)
Total Bilirubin: 1.7 mg/dL — ABNORMAL HIGH (ref 0.3–1.2)
Total Protein: 6.8 g/dL (ref 6.0–8.3)

## 2009-02-23 LAB — VITAMIN D 25 HYDROXY (VIT D DEFICIENCY, FRACTURES): Vit D, 25-Hydroxy: 36 ng/mL (ref 30–89)

## 2009-03-12 ENCOUNTER — Ambulatory Visit: Payer: Self-pay | Admitting: Hematology & Oncology

## 2009-03-15 LAB — PROTIME-INR (CHCC SATELLITE)
INR: 2.1 (ref 2.0–3.5)
Protime: 25.2 Seconds — ABNORMAL HIGH (ref 10.6–13.4)

## 2009-04-22 ENCOUNTER — Encounter: Admission: RE | Admit: 2009-04-22 | Discharge: 2009-04-22 | Payer: Self-pay | Admitting: Family Medicine

## 2009-04-30 ENCOUNTER — Ambulatory Visit: Payer: Self-pay | Admitting: Hematology & Oncology

## 2009-05-03 LAB — VITAMIN D 25 HYDROXY (VIT D DEFICIENCY, FRACTURES): Vit D, 25-Hydroxy: 29 ng/mL — ABNORMAL LOW (ref 30–89)

## 2009-05-03 LAB — CBC WITH DIFFERENTIAL (CANCER CENTER ONLY)
BASO#: 0.1 10*3/uL (ref 0.0–0.2)
BASO%: 1 % (ref 0.0–2.0)
EOS%: 3 % (ref 0.0–7.0)
Eosinophils Absolute: 0.2 10*3/uL (ref 0.0–0.5)
HCT: 47.7 % — ABNORMAL HIGH (ref 34.8–46.6)
HGB: 16.4 g/dL — ABNORMAL HIGH (ref 11.6–15.9)
LYMPH#: 2.2 10*3/uL (ref 0.9–3.3)
LYMPH%: 33.3 % (ref 14.0–48.0)
MCH: 33.2 pg (ref 26.0–34.0)
MCHC: 34.3 g/dL (ref 32.0–36.0)
MCV: 97 fL (ref 81–101)
MONO#: 0.5 10*3/uL (ref 0.1–0.9)
MONO%: 8 % (ref 0.0–13.0)
NEUT#: 3.6 10*3/uL (ref 1.5–6.5)
NEUT%: 54.7 % (ref 39.6–80.0)
Platelets: 444 10*3/uL — ABNORMAL HIGH (ref 145–400)
RBC: 4.92 10*6/uL (ref 3.70–5.32)
RDW: 10.7 % (ref 10.5–14.6)
WBC: 4.2 10*3/uL (ref 3.9–10.0)

## 2009-05-03 LAB — COMPREHENSIVE METABOLIC PANEL
ALT: 40 U/L — ABNORMAL HIGH (ref 0–35)
AST: 44 U/L — ABNORMAL HIGH (ref 0–37)
Albumin: 4.3 g/dL (ref 3.5–5.2)
Alkaline Phosphatase: 115 U/L (ref 39–117)
BUN: 13 mg/dL (ref 6–23)
CO2: 27 mEq/L (ref 19–32)
Calcium: 9.4 mg/dL (ref 8.4–10.5)
Chloride: 104 mEq/L (ref 96–112)
Creatinine, Ser: 0.73 mg/dL (ref 0.40–1.20)
Glucose, Bld: 153 mg/dL — ABNORMAL HIGH (ref 70–99)
Potassium: 3.9 mEq/L (ref 3.5–5.3)
Sodium: 141 mEq/L (ref 135–145)
Total Bilirubin: 1.2 mg/dL (ref 0.3–1.2)
Total Protein: 6.6 g/dL (ref 6.0–8.3)

## 2009-05-03 LAB — TECHNOLOGIST REVIEW CHCC SATELLITE

## 2009-05-03 LAB — PROTIME-INR (CHCC SATELLITE)
INR: 2.7 (ref 2.0–3.5)
Protime: 32.4 Seconds — ABNORMAL HIGH (ref 10.6–13.4)

## 2009-05-24 LAB — PROTIME-INR (CHCC SATELLITE)
INR: 1.3 — ABNORMAL LOW (ref 2.0–3.5)
Protime: 15.6 Seconds — ABNORMAL HIGH (ref 10.6–13.4)

## 2009-06-15 ENCOUNTER — Ambulatory Visit: Payer: Self-pay | Admitting: Hematology & Oncology

## 2009-06-16 LAB — PROTIME-INR (CHCC SATELLITE)
INR: 2.2 (ref 2.0–3.5)
Protime: 26.4 Seconds — ABNORMAL HIGH (ref 10.6–13.4)

## 2009-06-16 LAB — COMPREHENSIVE METABOLIC PANEL
ALT: 31 U/L (ref 0–35)
AST: 31 U/L (ref 0–37)
Albumin: 3.9 g/dL (ref 3.5–5.2)
Alkaline Phosphatase: 82 U/L (ref 39–117)
BUN: 18 mg/dL (ref 6–23)
CO2: 28 mEq/L (ref 19–32)
Calcium: 9.1 mg/dL (ref 8.4–10.5)
Chloride: 104 mEq/L (ref 96–112)
Creatinine, Ser: 0.75 mg/dL (ref 0.40–1.20)
Glucose, Bld: 181 mg/dL — ABNORMAL HIGH (ref 70–99)
Potassium: 4.1 mEq/L (ref 3.5–5.3)
Sodium: 142 mEq/L (ref 135–145)
Total Bilirubin: 0.9 mg/dL (ref 0.3–1.2)
Total Protein: 6.6 g/dL (ref 6.0–8.3)

## 2009-06-16 LAB — CBC WITH DIFFERENTIAL (CANCER CENTER ONLY)
BASO#: 0.1 10*3/uL (ref 0.0–0.2)
BASO%: 0.8 % (ref 0.0–2.0)
EOS%: 3.5 % (ref 0.0–7.0)
Eosinophils Absolute: 0.3 10*3/uL (ref 0.0–0.5)
HCT: 45.1 % (ref 34.8–46.6)
HGB: 15.2 g/dL (ref 11.6–15.9)
LYMPH#: 1.9 10*3/uL (ref 0.9–3.3)
LYMPH%: 25.6 % (ref 14.0–48.0)
MCH: 32.6 pg (ref 26.0–34.0)
MCHC: 33.7 g/dL (ref 32.0–36.0)
MCV: 96 fL (ref 81–101)
MONO#: 0.6 10*3/uL (ref 0.1–0.9)
MONO%: 8.5 % (ref 0.0–13.0)
NEUT#: 4.4 10*3/uL (ref 1.5–6.5)
NEUT%: 61.6 % (ref 39.6–80.0)
Platelets: 315 10*3/uL (ref 145–400)
RBC: 4.67 10*6/uL (ref 3.70–5.32)
RDW: 11.1 % (ref 10.5–14.6)
WBC: 7.2 10*3/uL (ref 3.9–10.0)

## 2009-06-16 LAB — TECHNOLOGIST REVIEW CHCC SATELLITE

## 2009-07-02 LAB — PROTIME-INR (CHCC SATELLITE)
INR: 2 (ref 2.0–3.5)
Protime: 24 Seconds — ABNORMAL HIGH (ref 10.6–13.4)

## 2009-07-08 ENCOUNTER — Ambulatory Visit: Admission: RE | Admit: 2009-07-08 | Discharge: 2009-07-08 | Payer: Self-pay | Admitting: Hematology & Oncology

## 2009-07-08 ENCOUNTER — Encounter: Payer: Self-pay | Admitting: Hematology & Oncology

## 2009-07-08 ENCOUNTER — Ambulatory Visit: Payer: Self-pay | Admitting: Vascular Surgery

## 2009-07-14 LAB — PROTIME-INR (CHCC SATELLITE)
INR: 2.2 (ref 2.0–3.5)
Protime: 26.4 Seconds — ABNORMAL HIGH (ref 10.6–13.4)

## 2009-07-29 ENCOUNTER — Emergency Department (HOSPITAL_COMMUNITY): Admission: EM | Admit: 2009-07-29 | Discharge: 2009-07-29 | Payer: Self-pay | Admitting: Emergency Medicine

## 2009-08-09 ENCOUNTER — Ambulatory Visit: Payer: Self-pay | Admitting: Hematology & Oncology

## 2009-08-11 LAB — COMPREHENSIVE METABOLIC PANEL
ALT: 28 U/L (ref 0–35)
AST: 29 U/L (ref 0–37)
Albumin: 4 g/dL (ref 3.5–5.2)
Alkaline Phosphatase: 98 U/L (ref 39–117)
BUN: 15 mg/dL (ref 6–23)
CO2: 27 mEq/L (ref 19–32)
Calcium: 9.5 mg/dL (ref 8.4–10.5)
Chloride: 103 mEq/L (ref 96–112)
Creatinine, Ser: 0.81 mg/dL (ref 0.40–1.20)
Glucose, Bld: 158 mg/dL — ABNORMAL HIGH (ref 70–99)
Potassium: 4.1 mEq/L (ref 3.5–5.3)
Sodium: 141 mEq/L (ref 135–145)
Total Bilirubin: 1.1 mg/dL (ref 0.3–1.2)
Total Protein: 6.6 g/dL (ref 6.0–8.3)

## 2009-08-11 LAB — CBC WITH DIFFERENTIAL (CANCER CENTER ONLY)
BASO#: 0.1 10*3/uL (ref 0.0–0.2)
BASO%: 1.2 % (ref 0.0–2.0)
EOS%: 2.4 % (ref 0.0–7.0)
Eosinophils Absolute: 0.2 10*3/uL (ref 0.0–0.5)
HCT: 44.9 % (ref 34.8–46.6)
HGB: 15.5 g/dL (ref 11.6–15.9)
LYMPH#: 1.9 10*3/uL (ref 0.9–3.3)
LYMPH%: 29.1 % (ref 14.0–48.0)
MCH: 33.6 pg (ref 26.0–34.0)
MCHC: 34.5 g/dL (ref 32.0–36.0)
MCV: 97 fL (ref 81–101)
MONO#: 0.6 10*3/uL (ref 0.1–0.9)
MONO%: 9.3 % (ref 0.0–13.0)
NEUT#: 3.8 10*3/uL (ref 1.5–6.5)
NEUT%: 58 % (ref 39.6–80.0)
Platelets: 353 10*3/uL (ref 145–400)
RBC: 4.61 10*6/uL (ref 3.70–5.32)
RDW: 11 % (ref 10.5–14.6)
WBC: 6.6 10*3/uL (ref 3.9–10.0)

## 2009-08-11 LAB — PROTIME-INR (CHCC SATELLITE)
INR: 1.9 — ABNORMAL LOW (ref 2.0–3.5)
Protime: 22.8 Seconds — ABNORMAL HIGH (ref 10.6–13.4)

## 2009-09-01 LAB — PROTIME-INR (CHCC SATELLITE)
INR: 2.1 (ref 2.0–3.5)
Protime: 25.2 Seconds — ABNORMAL HIGH (ref 10.6–13.4)

## 2009-09-22 ENCOUNTER — Emergency Department (HOSPITAL_COMMUNITY): Admission: EM | Admit: 2009-09-22 | Discharge: 2009-09-23 | Payer: Self-pay | Admitting: Emergency Medicine

## 2009-09-27 ENCOUNTER — Ambulatory Visit: Payer: Self-pay | Admitting: Hematology & Oncology

## 2009-09-29 LAB — COMPREHENSIVE METABOLIC PANEL
ALT: 60 U/L — ABNORMAL HIGH (ref 0–35)
AST: 48 U/L — ABNORMAL HIGH (ref 0–37)
Albumin: 4 g/dL (ref 3.5–5.2)
Alkaline Phosphatase: 128 U/L — ABNORMAL HIGH (ref 39–117)
BUN: 17 mg/dL (ref 6–23)
CO2: 30 mEq/L (ref 19–32)
Calcium: 9.3 mg/dL (ref 8.4–10.5)
Chloride: 102 mEq/L (ref 96–112)
Creatinine, Ser: 0.82 mg/dL (ref 0.40–1.20)
Glucose, Bld: 164 mg/dL — ABNORMAL HIGH (ref 70–99)
Potassium: 4.2 mEq/L (ref 3.5–5.3)
Sodium: 141 mEq/L (ref 135–145)
Total Bilirubin: 1.8 mg/dL — ABNORMAL HIGH (ref 0.3–1.2)
Total Protein: 6.5 g/dL (ref 6.0–8.3)

## 2009-09-29 LAB — CBC WITH DIFFERENTIAL (CANCER CENTER ONLY)
BASO#: 0.1 10*3/uL (ref 0.0–0.2)
BASO%: 1.7 % (ref 0.0–2.0)
EOS%: 3.5 % (ref 0.0–7.0)
Eosinophils Absolute: 0.2 10*3/uL (ref 0.0–0.5)
HCT: 44.3 % (ref 34.8–46.6)
HGB: 15.2 g/dL (ref 11.6–15.9)
LYMPH#: 1.8 10*3/uL (ref 0.9–3.3)
LYMPH%: 30.9 % (ref 14.0–48.0)
MCH: 33.6 pg (ref 26.0–34.0)
MCHC: 34.3 g/dL (ref 32.0–36.0)
MCV: 98 fL (ref 81–101)
MONO#: 0.5 10*3/uL (ref 0.1–0.9)
MONO%: 9 % (ref 0.0–13.0)
NEUT#: 3.1 10*3/uL (ref 1.5–6.5)
NEUT%: 54.9 % (ref 39.6–80.0)
Platelets: 398 10*3/uL (ref 145–400)
RBC: 4.52 10*6/uL (ref 3.70–5.32)
RDW: 11.3 % (ref 10.5–14.6)
WBC: 5.7 10*3/uL (ref 3.9–10.0)

## 2009-09-29 LAB — AFP TUMOR MARKER: AFP-Tumor Marker: 2.9 ng/mL (ref 0.0–8.0)

## 2009-09-29 LAB — PROTIME-INR (CHCC SATELLITE)
INR: 1.8 — ABNORMAL LOW (ref 2.0–3.5)
Protime: 21.6 Seconds — ABNORMAL HIGH (ref 10.6–13.4)

## 2009-09-29 LAB — TECHNOLOGIST REVIEW CHCC SATELLITE

## 2009-10-10 ENCOUNTER — Other Ambulatory Visit: Payer: Self-pay | Admitting: Emergency Medicine

## 2009-10-11 ENCOUNTER — Inpatient Hospital Stay (HOSPITAL_COMMUNITY): Admission: EM | Admit: 2009-10-11 | Discharge: 2009-10-14 | Payer: Self-pay | Admitting: Internal Medicine

## 2009-11-04 ENCOUNTER — Ambulatory Visit: Payer: Self-pay | Admitting: Hematology & Oncology

## 2009-11-08 ENCOUNTER — Ambulatory Visit: Payer: Self-pay | Admitting: Gastroenterology

## 2009-11-17 LAB — CBC WITH DIFFERENTIAL (CANCER CENTER ONLY)
BASO#: 0.1 10*3/uL (ref 0.0–0.2)
BASO%: 1.1 % (ref 0.0–2.0)
EOS%: 5.3 % (ref 0.0–7.0)
Eosinophils Absolute: 0.3 10*3/uL (ref 0.0–0.5)
HCT: 42.1 % (ref 34.8–46.6)
HGB: 14.5 g/dL (ref 11.6–15.9)
LYMPH#: 2.1 10*3/uL (ref 0.9–3.3)
LYMPH%: 34.5 % (ref 14.0–48.0)
MCH: 33.8 pg (ref 26.0–34.0)
MCHC: 34.5 g/dL (ref 32.0–36.0)
MCV: 98 fL (ref 81–101)
MONO#: 0.5 10*3/uL (ref 0.1–0.9)
MONO%: 8.4 % (ref 0.0–13.0)
NEUT#: 3.1 10*3/uL (ref 1.5–6.5)
NEUT%: 50.7 % (ref 39.6–80.0)
Platelets: 309 10*3/uL (ref 145–400)
RBC: 4.3 10*6/uL (ref 3.70–5.32)
RDW: 11.1 % (ref 10.5–14.6)
WBC: 6.2 10*3/uL (ref 3.9–10.0)

## 2009-11-17 LAB — COMPREHENSIVE METABOLIC PANEL
ALT: 33 U/L (ref 0–35)
AST: 33 U/L (ref 0–37)
Albumin: 4.3 g/dL (ref 3.5–5.2)
Alkaline Phosphatase: 77 U/L (ref 39–117)
BUN: 19 mg/dL (ref 6–23)
CO2: 26 mEq/L (ref 19–32)
Calcium: 9.5 mg/dL (ref 8.4–10.5)
Chloride: 104 mEq/L (ref 96–112)
Creatinine, Ser: 1.07 mg/dL (ref 0.40–1.20)
Glucose, Bld: 114 mg/dL — ABNORMAL HIGH (ref 70–99)
Potassium: 4 mEq/L (ref 3.5–5.3)
Sodium: 141 mEq/L (ref 135–145)
Total Bilirubin: 1.8 mg/dL — ABNORMAL HIGH (ref 0.3–1.2)
Total Protein: 6.4 g/dL (ref 6.0–8.3)

## 2009-11-17 LAB — PROTIME-INR (CHCC SATELLITE)
INR: 1.5 — ABNORMAL LOW (ref 2.0–3.5)
Protime: 18 Seconds — ABNORMAL HIGH (ref 10.6–13.4)

## 2009-12-21 ENCOUNTER — Ambulatory Visit: Payer: Self-pay | Admitting: Hematology & Oncology

## 2009-12-22 LAB — PROTIME-INR (CHCC SATELLITE)
INR: 1.7 — ABNORMAL LOW (ref 2.0–3.5)
Protime: 20.4 Seconds — ABNORMAL HIGH (ref 10.6–13.4)

## 2010-01-19 LAB — PROTIME-INR (CHCC SATELLITE)
INR: 1.6 — ABNORMAL LOW (ref 2.0–3.5)
Protime: 19.2 Seconds — ABNORMAL HIGH (ref 10.6–13.4)

## 2010-02-11 ENCOUNTER — Ambulatory Visit: Payer: Self-pay | Admitting: Hematology & Oncology

## 2010-02-16 LAB — PROTIME-INR (CHCC SATELLITE)
INR: 2 (ref 2.0–3.5)
Protime: 24 Seconds — ABNORMAL HIGH (ref 10.6–13.4)

## 2010-03-15 ENCOUNTER — Ambulatory Visit: Payer: Self-pay | Admitting: Hematology & Oncology

## 2010-03-16 LAB — CBC WITH DIFFERENTIAL (CANCER CENTER ONLY)
BASO#: 0.1 10*3/uL (ref 0.0–0.2)
BASO%: 1.3 % (ref 0.0–2.0)
EOS%: 2.5 % (ref 0.0–7.0)
Eosinophils Absolute: 0.2 10*3/uL (ref 0.0–0.5)
HCT: 45.8 % (ref 34.8–46.6)
HGB: 15.3 g/dL (ref 11.6–15.9)
LYMPH#: 2.4 10*3/uL (ref 0.9–3.3)
LYMPH%: 34.1 % (ref 14.0–48.0)
MCH: 33 pg (ref 26.0–34.0)
MCHC: 33.5 g/dL (ref 32.0–36.0)
MCV: 98 fL (ref 81–101)
MONO#: 0.8 10*3/uL (ref 0.1–0.9)
MONO%: 11 % (ref 0.0–13.0)
NEUT#: 3.6 10*3/uL (ref 1.5–6.5)
NEUT%: 51.1 % (ref 39.6–80.0)
Platelets: 360 10*3/uL (ref 145–400)
RBC: 4.65 10*6/uL (ref 3.70–5.32)
RDW: 10.8 % (ref 10.5–14.6)
WBC: 7 10*3/uL (ref 3.9–10.0)

## 2010-03-16 LAB — TECHNOLOGIST REVIEW CHCC SATELLITE

## 2010-03-16 LAB — PROTIME-INR (CHCC SATELLITE)
INR: 1.6 — ABNORMAL LOW (ref 2.0–3.5)
Protime: 19.2 Seconds — ABNORMAL HIGH (ref 10.6–13.4)

## 2010-03-17 LAB — COMPREHENSIVE METABOLIC PANEL
ALT: 28 U/L (ref 0–35)
AST: 29 U/L (ref 0–37)
Albumin: 4.3 g/dL (ref 3.5–5.2)
Alkaline Phosphatase: 81 U/L (ref 39–117)
BUN: 17 mg/dL (ref 6–23)
CO2: 25 mEq/L (ref 19–32)
Calcium: 9.7 mg/dL (ref 8.4–10.5)
Chloride: 102 mEq/L (ref 96–112)
Creatinine, Ser: 0.75 mg/dL (ref 0.40–1.20)
Glucose, Bld: 84 mg/dL (ref 70–99)
Potassium: 4 mEq/L (ref 3.5–5.3)
Sodium: 142 mEq/L (ref 135–145)
Total Bilirubin: 1.4 mg/dL — ABNORMAL HIGH (ref 0.3–1.2)
Total Protein: 6.9 g/dL (ref 6.0–8.3)

## 2010-03-17 LAB — VITAMIN D 25 HYDROXY (VIT D DEFICIENCY, FRACTURES): Vit D, 25-Hydroxy: 34 ng/mL (ref 30–89)

## 2010-03-25 ENCOUNTER — Encounter (HOSPITAL_COMMUNITY)
Admission: RE | Admit: 2010-03-25 | Discharge: 2010-05-17 | Payer: Self-pay | Source: Home / Self Care | Attending: Specialist | Admitting: Specialist

## 2010-03-30 LAB — PROTIME-INR (CHCC SATELLITE)
INR: 3.7 — ABNORMAL HIGH (ref 2.0–3.5)
Protime: 44.4 Seconds — ABNORMAL HIGH (ref 10.6–13.4)

## 2010-04-13 LAB — PORPHYRINS, FRACTIONATED URINE (TIMED COLLECTION)

## 2010-04-13 LAB — CBC WITH DIFFERENTIAL (CANCER CENTER ONLY)
BASO#: 0.1 10*3/uL (ref 0.0–0.2)
BASO%: 1 % (ref 0.0–2.0)
EOS%: 3.1 % (ref 0.0–7.0)
Eosinophils Absolute: 0.2 10*3/uL (ref 0.0–0.5)
HCT: 42 % (ref 34.8–46.6)
HGB: 14.5 g/dL (ref 11.6–15.9)
LYMPH#: 2.4 10*3/uL (ref 0.9–3.3)
LYMPH%: 32.6 % (ref 14.0–48.0)
MCH: 33.6 pg (ref 26.0–34.0)
MCHC: 34.5 g/dL (ref 32.0–36.0)
MCV: 97 fL (ref 81–101)
MONO#: 0.8 10*3/uL (ref 0.1–0.9)
MONO%: 10.5 % (ref 0.0–13.0)
NEUT#: 3.8 10*3/uL (ref 1.5–6.5)
NEUT%: 52.8 % (ref 39.6–80.0)
Platelets: 380 10*3/uL (ref 145–400)
RBC: 4.32 10*6/uL (ref 3.70–5.32)
RDW: 10.5 % (ref 10.5–14.6)
WBC: 7.3 10*3/uL (ref 3.9–10.0)

## 2010-04-13 LAB — LACTATE DEHYDROGENASE: LDH: 197 U/L (ref 94–250)

## 2010-04-13 LAB — COMPREHENSIVE METABOLIC PANEL
ALT: 28 U/L (ref 0–35)
AST: 29 U/L (ref 0–37)
Albumin: 4.1 g/dL (ref 3.5–5.2)
Alkaline Phosphatase: 85 U/L (ref 39–117)
BUN: 16 mg/dL (ref 6–23)
CO2: 27 mEq/L (ref 19–32)
Calcium: 9.6 mg/dL (ref 8.4–10.5)
Chloride: 106 mEq/L (ref 96–112)
Creatinine, Ser: 0.79 mg/dL (ref 0.40–1.20)
Glucose, Bld: 101 mg/dL — ABNORMAL HIGH (ref 70–99)
Potassium: 4.1 mEq/L (ref 3.5–5.3)
Sodium: 143 mEq/L (ref 135–145)
Total Bilirubin: 0.9 mg/dL (ref 0.3–1.2)
Total Protein: 6.4 g/dL (ref 6.0–8.3)

## 2010-04-13 LAB — PROTIME-INR (CHCC SATELLITE)
INR: 1.5 — ABNORMAL LOW (ref 2.0–3.5)
Protime: 18 Seconds — ABNORMAL HIGH (ref 10.6–13.4)

## 2010-04-19 ENCOUNTER — Ambulatory Visit (HOSPITAL_BASED_OUTPATIENT_CLINIC_OR_DEPARTMENT_OTHER): Payer: PRIVATE HEALTH INSURANCE | Admitting: Hematology & Oncology

## 2010-04-20 LAB — PROTIME-INR (CHCC SATELLITE)
INR: 1.6 — ABNORMAL LOW (ref 2.0–3.5)
Protime: 19.2 Seconds — ABNORMAL HIGH (ref 10.6–13.4)

## 2010-05-04 LAB — PROTIME-INR (CHCC SATELLITE)
INR: 3.2 (ref 2.0–3.5)
Protime: 38.4 Seconds — ABNORMAL HIGH (ref 10.6–13.4)

## 2010-05-08 ENCOUNTER — Emergency Department (HOSPITAL_BASED_OUTPATIENT_CLINIC_OR_DEPARTMENT_OTHER)
Admission: EM | Admit: 2010-05-08 | Discharge: 2010-05-08 | Payer: Self-pay | Source: Home / Self Care | Admitting: Emergency Medicine

## 2010-05-09 ENCOUNTER — Ambulatory Visit: Admit: 2010-05-09 | Payer: Self-pay | Admitting: Gastroenterology

## 2010-05-09 DIAGNOSIS — E049 Nontoxic goiter, unspecified: Secondary | ICD-10-CM

## 2010-05-17 ENCOUNTER — Encounter
Admission: RE | Admit: 2010-05-17 | Discharge: 2010-05-17 | Payer: Self-pay | Source: Home / Self Care | Attending: Otolaryngology | Admitting: Otolaryngology

## 2010-05-17 ENCOUNTER — Other Ambulatory Visit: Payer: Self-pay | Admitting: Interventional Radiology

## 2010-05-17 ENCOUNTER — Other Ambulatory Visit (HOSPITAL_COMMUNITY)
Admission: RE | Admit: 2010-05-17 | Payer: PRIVATE HEALTH INSURANCE | Source: Ambulatory Visit | Admitting: Interventional Radiology

## 2010-05-17 ENCOUNTER — Other Ambulatory Visit (HOSPITAL_COMMUNITY)
Admission: RE | Admit: 2010-05-17 | Discharge: 2010-05-17 | Disposition: A | Discharge status: Home / Self Care | Payer: PRIVATE HEALTH INSURANCE | Source: Ambulatory Visit | Attending: Interventional Radiology | Admitting: Interventional Radiology

## 2010-05-17 ENCOUNTER — Other Ambulatory Visit (HOSPITAL_COMMUNITY): Payer: Self-pay | Admitting: Otolaryngology

## 2010-05-17 DIAGNOSIS — E041 Nontoxic single thyroid nodule: Secondary | ICD-10-CM

## 2010-05-17 DIAGNOSIS — E049 Nontoxic goiter, unspecified: Secondary | ICD-10-CM | POA: Insufficient documentation

## 2010-05-18 ENCOUNTER — Other Ambulatory Visit (HOSPITAL_COMMUNITY): Payer: Self-pay

## 2010-06-01 ENCOUNTER — Encounter (HOSPITAL_BASED_OUTPATIENT_CLINIC_OR_DEPARTMENT_OTHER): Payer: PRIVATE HEALTH INSURANCE | Admitting: Hematology & Oncology

## 2010-06-01 DIAGNOSIS — Z7901 Long term (current) use of anticoagulants: Secondary | ICD-10-CM

## 2010-06-01 DIAGNOSIS — Z86718 Personal history of other venous thrombosis and embolism: Secondary | ICD-10-CM

## 2010-06-01 DIAGNOSIS — K7689 Other specified diseases of liver: Secondary | ICD-10-CM

## 2010-06-01 DIAGNOSIS — D6859 Other primary thrombophilia: Secondary | ICD-10-CM

## 2010-06-01 LAB — PROTIME-INR (CHCC SATELLITE)
INR: 2.2 (ref 2.0–3.5)
Protime: 26.4 Seconds — ABNORMAL HIGH (ref 10.6–13.4)

## 2010-06-29 ENCOUNTER — Encounter (HOSPITAL_BASED_OUTPATIENT_CLINIC_OR_DEPARTMENT_OTHER): Payer: PRIVATE HEALTH INSURANCE | Admitting: Hematology & Oncology

## 2010-06-29 ENCOUNTER — Other Ambulatory Visit: Payer: Self-pay | Admitting: Hematology & Oncology

## 2010-06-29 DIAGNOSIS — D6859 Other primary thrombophilia: Secondary | ICD-10-CM

## 2010-06-29 DIAGNOSIS — Z7901 Long term (current) use of anticoagulants: Secondary | ICD-10-CM

## 2010-06-29 DIAGNOSIS — K7689 Other specified diseases of liver: Secondary | ICD-10-CM

## 2010-06-29 DIAGNOSIS — Z86718 Personal history of other venous thrombosis and embolism: Secondary | ICD-10-CM

## 2010-06-29 LAB — PROTIME-INR (CHCC SATELLITE)
INR: 2.5 (ref 2.0–3.5)
Protime: 30 Seconds — ABNORMAL HIGH (ref 10.6–13.4)

## 2010-06-29 LAB — CBC WITH DIFFERENTIAL (CANCER CENTER ONLY)
BASO#: 0.1 10*3/uL (ref 0.0–0.2)
BASO%: 1 % (ref 0.0–2.0)
EOS%: 3.2 % (ref 0.0–7.0)
Eosinophils Absolute: 0.2 10*3/uL (ref 0.0–0.5)
HCT: 42.4 % (ref 34.8–46.6)
HGB: 14.8 g/dL (ref 11.6–15.9)
LYMPH#: 2.1 10*3/uL (ref 0.9–3.3)
LYMPH%: 29.9 % (ref 14.0–48.0)
MCH: 32.3 pg (ref 26.0–34.0)
MCHC: 34.9 g/dL (ref 32.0–36.0)
MCV: 93 fL (ref 81–101)
MONO#: 0.9 10*3/uL (ref 0.1–0.9)
MONO%: 12.4 % (ref 0.0–13.0)
NEUT#: 3.8 10*3/uL (ref 1.5–6.5)
NEUT%: 53.5 % (ref 39.6–80.0)
Platelets: 349 10*3/uL (ref 145–400)
RBC: 4.58 10*6/uL (ref 3.70–5.32)
RDW: 13.1 % (ref 11.1–15.7)
WBC: 7.2 10*3/uL (ref 3.9–10.0)

## 2010-06-30 LAB — COMPREHENSIVE METABOLIC PANEL
ALT: 31 U/L (ref 0–35)
AST: 34 U/L (ref 0–37)
Albumin: 4.3 g/dL (ref 3.5–5.2)
Alkaline Phosphatase: 92 U/L (ref 39–117)
BUN: 17 mg/dL (ref 6–23)
CO2: 26 mEq/L (ref 19–32)
Calcium: 10 mg/dL (ref 8.4–10.5)
Chloride: 103 mEq/L (ref 96–112)
Creatinine, Ser: 0.79 mg/dL (ref 0.40–1.20)
Glucose, Bld: 141 mg/dL — ABNORMAL HIGH (ref 70–99)
Potassium: 4.2 mEq/L (ref 3.5–5.3)
Sodium: 141 mEq/L (ref 135–145)
Total Bilirubin: 1.3 mg/dL — ABNORMAL HIGH (ref 0.3–1.2)
Total Protein: 6.7 g/dL (ref 6.0–8.3)

## 2010-06-30 LAB — VITAMIN D 25 HYDROXY (VIT D DEFICIENCY, FRACTURES): Vit D, 25-Hydroxy: 23 ng/mL — ABNORMAL LOW (ref 30–89)

## 2010-07-03 LAB — DIFFERENTIAL
Basophils Absolute: 0 10*3/uL (ref 0.0–0.1)
Basophils Absolute: 0 10*3/uL (ref 0.0–0.1)
Basophils Relative: 0 % (ref 0–1)
Basophils Relative: 0 % (ref 0–1)
Eosinophils Absolute: 0 10*3/uL (ref 0.0–0.7)
Eosinophils Absolute: 0.1 10*3/uL (ref 0.0–0.7)
Eosinophils Relative: 0 % (ref 0–5)
Eosinophils Relative: 0 % (ref 0–5)
Lymphocytes Relative: 10 % — ABNORMAL LOW (ref 12–46)
Lymphocytes Relative: 6 % — ABNORMAL LOW (ref 12–46)
Lymphs Abs: 1.7 10*3/uL (ref 0.7–4.0)
Lymphs Abs: 0.9 10*3/uL (ref 0.7–4.0)
Monocytes Absolute: 0.2 10*3/uL (ref 0.1–1.0)
Monocytes Absolute: 0.9 10*3/uL (ref 0.1–1.0)
Monocytes Relative: 2 % — ABNORMAL LOW (ref 3–12)
Monocytes Relative: 6 % (ref 3–12)
Neutro Abs: 14.4 10*3/uL — ABNORMAL HIGH (ref 1.7–7.7)
Neutro Abs: 13.7 10*3/uL — ABNORMAL HIGH (ref 1.7–7.7)
Neutrophils Relative %: 88 % — ABNORMAL HIGH (ref 43–77)
Neutrophils Relative %: 88 % — ABNORMAL HIGH (ref 43–77)

## 2010-07-03 LAB — CULTURE, BLOOD (ROUTINE X 2)
Culture: NO GROWTH
Culture: NO GROWTH

## 2010-07-03 LAB — CBC
HCT: 36.9 % (ref 36.0–46.0)
HCT: 37.8 % (ref 36.0–46.0)
HCT: 39.4 % (ref 36.0–46.0)
HCT: 39.5 % (ref 36.0–46.0)
HCT: 43.6 % (ref 36.0–46.0)
Hemoglobin: 12.9 g/dL (ref 12.0–15.0)
Hemoglobin: 13.1 g/dL (ref 12.0–15.0)
Hemoglobin: 13.6 g/dL (ref 12.0–15.0)
Hemoglobin: 13.7 g/dL (ref 12.0–15.0)
Hemoglobin: 15.1 g/dL — ABNORMAL HIGH (ref 12.0–15.0)
MCH: 34.4 pg — ABNORMAL HIGH (ref 26.0–34.0)
MCH: 34.8 pg — ABNORMAL HIGH (ref 26.0–34.0)
MCH: 34.8 pg — ABNORMAL HIGH (ref 26.0–34.0)
MCH: 34.9 pg — ABNORMAL HIGH (ref 26.0–34.0)
MCH: 34.3 pg — ABNORMAL HIGH (ref 26.0–34.0)
MCHC: 34.5 g/dL (ref 30.0–36.0)
MCHC: 34.7 g/dL (ref 30.0–36.0)
MCHC: 34.7 g/dL (ref 30.0–36.0)
MCHC: 34.9 g/dL (ref 30.0–36.0)
MCHC: 34.8 g/dL (ref 30.0–36.0)
MCV: 100.1 fL — ABNORMAL HIGH (ref 78.0–100.0)
MCV: 100.6 fL — ABNORMAL HIGH (ref 78.0–100.0)
MCV: 100.9 fL — ABNORMAL HIGH (ref 78.0–100.0)
MCV: 98.6 fL (ref 78.0–100.0)
MCV: 98.6 fL (ref 78.0–100.0)
Platelets: 300 10*3/uL (ref 150–400)
Platelets: 327 10*3/uL (ref 150–400)
Platelets: 339 10*3/uL (ref 150–400)
Platelets: 371 10*3/uL (ref 150–400)
Platelets: 354 10*3/uL (ref 150–400)
RBC: 3.74 MIL/uL — ABNORMAL LOW (ref 3.87–5.11)
RBC: 3.77 MIL/uL — ABNORMAL LOW (ref 3.87–5.11)
RBC: 3.9 MIL/uL (ref 3.87–5.11)
RBC: 3.93 MIL/uL (ref 3.87–5.11)
RBC: 4.42 MIL/uL (ref 3.87–5.11)
RDW: 13.6 % (ref 11.5–15.5)
RDW: 13.7 % (ref 11.5–15.5)
RDW: 13.9 % (ref 11.5–15.5)
RDW: 14.1 % (ref 11.5–15.5)
RDW: 12.7 % (ref 11.5–15.5)
WBC: 11.2 10*3/uL — ABNORMAL HIGH (ref 4.0–10.5)
WBC: 16.4 10*3/uL — ABNORMAL HIGH (ref 4.0–10.5)
WBC: 7.8 10*3/uL (ref 4.0–10.5)
WBC: 8.1 10*3/uL (ref 4.0–10.5)
WBC: 15.6 10*3/uL — ABNORMAL HIGH (ref 4.0–10.5)

## 2010-07-03 LAB — COMPREHENSIVE METABOLIC PANEL
ALT: 125 U/L — ABNORMAL HIGH (ref 0–35)
ALT: 80 U/L — ABNORMAL HIGH (ref 0–35)
ALT: 99 U/L — ABNORMAL HIGH (ref 0–35)
ALT: 173 U/L — ABNORMAL HIGH (ref 0–35)
AST: 57 U/L — ABNORMAL HIGH (ref 0–37)
AST: 63 U/L — ABNORMAL HIGH (ref 0–37)
AST: 85 U/L — ABNORMAL HIGH (ref 0–37)
AST: 116 U/L — ABNORMAL HIGH (ref 0–37)
Albumin: 2.7 g/dL — ABNORMAL LOW (ref 3.5–5.2)
Albumin: 2.8 g/dL — ABNORMAL LOW (ref 3.5–5.2)
Albumin: 3 g/dL — ABNORMAL LOW (ref 3.5–5.2)
Albumin: 3.8 g/dL (ref 3.5–5.2)
Alkaline Phosphatase: 158 U/L — ABNORMAL HIGH (ref 39–117)
Alkaline Phosphatase: 166 U/L — ABNORMAL HIGH (ref 39–117)
Alkaline Phosphatase: 171 U/L — ABNORMAL HIGH (ref 39–117)
Alkaline Phosphatase: 237 U/L — ABNORMAL HIGH (ref 39–117)
BUN: 11 mg/dL (ref 6–23)
BUN: 4 mg/dL — ABNORMAL LOW (ref 6–23)
BUN: 7 mg/dL (ref 6–23)
BUN: 12 mg/dL (ref 6–23)
CO2: 27 mEq/L (ref 19–32)
CO2: 28 mEq/L (ref 19–32)
CO2: 28 mEq/L (ref 19–32)
CO2: 27 mEq/L (ref 19–32)
Calcium: 8.7 mg/dL (ref 8.4–10.5)
Calcium: 8.8 mg/dL (ref 8.4–10.5)
Calcium: 9 mg/dL (ref 8.4–10.5)
Calcium: 9.1 mg/dL (ref 8.4–10.5)
Chloride: 104 mEq/L (ref 96–112)
Chloride: 105 mEq/L (ref 96–112)
Chloride: 107 mEq/L (ref 96–112)
Chloride: 104 mEq/L (ref 96–112)
Creatinine, Ser: 0.88 mg/dL (ref 0.4–1.2)
Creatinine, Ser: 0.93 mg/dL (ref 0.4–1.2)
Creatinine, Ser: 1.01 mg/dL (ref 0.4–1.2)
Creatinine, Ser: 0.9 mg/dL (ref 0.4–1.2)
GFR calc Af Amer: >60 mL/min (ref >60)
GFR calc Af Amer: >60 mL/min (ref >60)
GFR calc Af Amer: >60 mL/min (ref >60)
GFR calc Af Amer: >60 mL/min (ref >60)
GFR calc non Af Amer: 56 mL/min — ABNORMAL LOW (ref >60)
GFR calc non Af Amer: >60 mL/min (ref >60)
GFR calc non Af Amer: >60 mL/min (ref >60)
GFR calc non Af Amer: >60 mL/min (ref >60)
Glucose, Bld: 117 mg/dL — ABNORMAL HIGH (ref 70–99)
Glucose, Bld: 97 mg/dL (ref 70–99)
Glucose, Bld: 97 mg/dL (ref 70–99)
Glucose, Bld: 166 mg/dL — ABNORMAL HIGH (ref 70–99)
Potassium: 3.3 mEq/L — ABNORMAL LOW (ref 3.5–5.1)
Potassium: 3.8 mEq/L (ref 3.5–5.1)
Potassium: 3.9 mEq/L (ref 3.5–5.1)
Potassium: 4.7 mEq/L (ref 3.5–5.1)
Sodium: 141 mEq/L (ref 135–145)
Sodium: 141 mEq/L (ref 135–145)
Sodium: 142 mEq/L (ref 135–145)
Sodium: 145 mEq/L (ref 135–145)
Total Bilirubin: 10.3 mg/dL — ABNORMAL HIGH (ref 0.3–1.2)
Total Bilirubin: 9.2 mg/dL — ABNORMAL HIGH (ref 0.3–1.2)
Total Bilirubin: 9.8 mg/dL — ABNORMAL HIGH (ref 0.3–1.2)
Total Bilirubin: 8.4 mg/dL — ABNORMAL HIGH (ref 0.3–1.2)
Total Protein: 6 g/dL (ref 6.0–8.3)
Total Protein: 6.2 g/dL (ref 6.0–8.3)
Total Protein: 6.4 g/dL (ref 6.0–8.3)
Total Protein: 7.2 g/dL (ref 6.0–8.3)

## 2010-07-03 LAB — GLUCOSE, CAPILLARY
Glucose-Capillary: 109 mg/dL — ABNORMAL HIGH (ref 70–99)
Glucose-Capillary: 109 mg/dL — ABNORMAL HIGH (ref 70–99)
Glucose-Capillary: 118 mg/dL — ABNORMAL HIGH (ref 70–99)
Glucose-Capillary: 119 mg/dL — ABNORMAL HIGH (ref 70–99)
Glucose-Capillary: 121 mg/dL — ABNORMAL HIGH (ref 70–99)
Glucose-Capillary: 128 mg/dL — ABNORMAL HIGH (ref 70–99)
Glucose-Capillary: 143 mg/dL — ABNORMAL HIGH (ref 70–99)
Glucose-Capillary: 156 mg/dL — ABNORMAL HIGH (ref 70–99)
Glucose-Capillary: 165 mg/dL — ABNORMAL HIGH (ref 70–99)
Glucose-Capillary: 168 mg/dL — ABNORMAL HIGH (ref 70–99)
Glucose-Capillary: 181 mg/dL — ABNORMAL HIGH (ref 70–99)
Glucose-Capillary: 190 mg/dL — ABNORMAL HIGH (ref 70–99)
Glucose-Capillary: 191 mg/dL — ABNORMAL HIGH (ref 70–99)
Glucose-Capillary: 230 mg/dL — ABNORMAL HIGH (ref 70–99)
Glucose-Capillary: 81 mg/dL (ref 70–99)
Glucose-Capillary: 84 mg/dL (ref 70–99)
Glucose-Capillary: 86 mg/dL (ref 70–99)
Glucose-Capillary: 90 mg/dL (ref 70–99)
Glucose-Capillary: 93 mg/dL (ref 70–99)
Glucose-Capillary: 93 mg/dL (ref 70–99)
Glucose-Capillary: 96 mg/dL (ref 70–99)

## 2010-07-03 LAB — URINALYSIS, ROUTINE W REFLEX MICROSCOPIC
Glucose, UA: NEGATIVE mg/dL
Hgb urine dipstick: NEGATIVE
Ketones, ur: NEGATIVE mg/dL
Nitrite: NEGATIVE
Protein, ur: NEGATIVE mg/dL
Specific Gravity, Urine: 1.014 (ref 1.005–1.030)
Urobilinogen, UA: 1 mg/dL (ref 0.0–1.0)
pH: 5 (ref 5.0–8.0)

## 2010-07-03 LAB — BASIC METABOLIC PANEL
BUN: 4 mg/dL — ABNORMAL LOW (ref 6–23)
CO2: 29 mEq/L (ref 19–32)
Calcium: 9.1 mg/dL (ref 8.4–10.5)
Chloride: 103 mEq/L (ref 96–112)
Creatinine, Ser: 0.88 mg/dL (ref 0.4–1.2)
GFR calc Af Amer: >60 mL/min (ref >60)
GFR calc non Af Amer: >60 mL/min (ref >60)
Glucose, Bld: 131 mg/dL — ABNORMAL HIGH (ref 70–99)
Potassium: 3.6 mEq/L (ref 3.5–5.1)
Sodium: 141 mEq/L (ref 135–145)

## 2010-07-03 LAB — URINE MICROSCOPIC-ADD ON

## 2010-07-03 LAB — PROTIME-INR
INR: 1.1 (ref 0.00–1.49)
Prothrombin Time: 14.1 seconds (ref 11.6–15.2)

## 2010-07-03 LAB — LIPASE, BLOOD: Lipase: 175 U/L (ref 23–300)

## 2010-07-03 LAB — APTT: aPTT: 31 seconds (ref 24–37)

## 2010-07-03 LAB — HEMOGLOBIN A1C
Hgb A1c MFr Bld: 6.4 % — ABNORMAL HIGH (ref <5.7)
Mean Plasma Glucose: 137 mg/dL — ABNORMAL HIGH (ref <117)

## 2010-07-04 LAB — COMPREHENSIVE METABOLIC PANEL
ALT: 191 U/L — ABNORMAL HIGH (ref 0–35)
AST: 90 U/L — ABNORMAL HIGH (ref 0–37)
Albumin: 3.6 g/dL (ref 3.5–5.2)
Alkaline Phosphatase: 142 U/L — ABNORMAL HIGH (ref 39–117)
BUN: 10 mg/dL (ref 6–23)
CO2: 28 mEq/L (ref 19–32)
Calcium: 9.2 mg/dL (ref 8.4–10.5)
Chloride: 105 mEq/L (ref 96–112)
Creatinine, Ser: 0.85 mg/dL (ref 0.4–1.2)
GFR calc Af Amer: >60 mL/min (ref >60)
GFR calc non Af Amer: >60 mL/min (ref >60)
Glucose, Bld: 81 mg/dL (ref 70–99)
Potassium: 3.3 mEq/L — ABNORMAL LOW (ref 3.5–5.1)
Sodium: 142 mEq/L (ref 135–145)
Total Bilirubin: 4.1 mg/dL — ABNORMAL HIGH (ref 0.3–1.2)
Total Protein: 6.9 g/dL (ref 6.0–8.3)

## 2010-07-04 LAB — URINE MICROSCOPIC-ADD ON

## 2010-07-04 LAB — DIFFERENTIAL
Basophils Absolute: 0.1 10*3/uL (ref 0.0–0.1)
Basophils Relative: 1 % (ref 0–1)
Eosinophils Absolute: 0.2 10*3/uL (ref 0.0–0.7)
Eosinophils Relative: 3 % (ref 0–5)
Lymphocytes Relative: 29 % (ref 12–46)
Lymphs Abs: 2.4 10*3/uL (ref 0.7–4.0)
Monocytes Absolute: 1 10*3/uL (ref 0.1–1.0)
Monocytes Relative: 12 % (ref 3–12)
Neutro Abs: 4.4 10*3/uL (ref 1.7–7.7)
Neutrophils Relative %: 55 % (ref 43–77)

## 2010-07-04 LAB — URINALYSIS, ROUTINE W REFLEX MICROSCOPIC
Glucose, UA: NEGATIVE mg/dL
Hgb urine dipstick: NEGATIVE
Ketones, ur: 15 mg/dL — AB
Nitrite: NEGATIVE
Protein, ur: NEGATIVE mg/dL
Specific Gravity, Urine: 1.012 (ref 1.005–1.030)
Urobilinogen, UA: 2 mg/dL — ABNORMAL HIGH (ref 0.0–1.0)
pH: 5 (ref 5.0–8.0)

## 2010-07-04 LAB — CBC
HCT: 42.9 % (ref 36.0–46.0)
Hemoglobin: 14.8 g/dL (ref 12.0–15.0)
MCHC: 34.5 g/dL (ref 30.0–36.0)
MCV: 99.3 fL (ref 78.0–100.0)
Platelets: 258 10*3/uL (ref 150–400)
RBC: 4.32 MIL/uL (ref 3.87–5.11)
RDW: 13 % (ref 11.5–15.5)
WBC: 8.1 10*3/uL (ref 4.0–10.5)

## 2010-07-04 LAB — PROTIME-INR
INR: 1.86 — ABNORMAL HIGH (ref 0.00–1.49)
Prothrombin Time: 21.3 seconds — ABNORMAL HIGH (ref 11.6–15.2)

## 2010-07-04 LAB — LIPASE, BLOOD: Lipase: 26 U/L (ref 11–59)

## 2010-07-06 LAB — POCT I-STAT, CHEM 8
BUN: 16 mg/dL (ref 6–23)
Calcium, Ion: 1.17 mmol/L (ref 1.12–1.32)
Chloride: 106 mEq/L (ref 96–112)
Creatinine, Ser: 0.7 mg/dL (ref 0.4–1.2)
Glucose, Bld: 142 mg/dL — ABNORMAL HIGH (ref 70–99)
HCT: 47 % — ABNORMAL HIGH (ref 36.0–46.0)
Hemoglobin: 16 g/dL — ABNORMAL HIGH (ref 12.0–15.0)
Potassium: 3.9 mEq/L (ref 3.5–5.1)
Sodium: 142 mEq/L (ref 135–145)
TCO2: 31 mmol/L (ref 0–100)

## 2010-07-06 LAB — PROTIME-INR
INR: 1.99 — ABNORMAL HIGH (ref 0.00–1.49)
Prothrombin Time: 22.4 seconds — ABNORMAL HIGH (ref 11.6–15.2)

## 2010-07-06 LAB — TYPE AND SCREEN
ABO/RH(D): AB NEG
Antibody Screen: NEGATIVE

## 2010-07-06 LAB — ABO/RH: ABO/RH(D): AB NEG

## 2010-07-28 LAB — POCT B-TYPE NATRIURETIC PEPTIDE (BNP): B Natriuretic Peptide, POC: 8.7 pg/mL (ref 0–100)

## 2010-07-28 LAB — BASIC METABOLIC PANEL
BUN: 15 mg/dL (ref 6–23)
CO2: 28 mEq/L (ref 19–32)
Calcium: 9.3 mg/dL (ref 8.4–10.5)
Chloride: 105 mEq/L (ref 96–112)
Creatinine, Ser: 0.8 mg/dL (ref 0.4–1.2)
GFR calc Af Amer: >60 mL/min (ref >60)
GFR calc non Af Amer: >60 mL/min (ref >60)
Glucose, Bld: 118 mg/dL — ABNORMAL HIGH (ref 70–99)
Potassium: 3.8 mEq/L (ref 3.5–5.1)
Sodium: 142 mEq/L (ref 135–145)

## 2010-07-28 LAB — POCT CARDIAC MARKERS
CKMB, poc: 1.5 ng/mL (ref 1.0–8.0)
Myoglobin, poc: 73.2 ng/mL (ref 12–200)
Troponin i, poc: <0.05 ng/mL (ref 0.00–0.09)

## 2010-07-28 LAB — CBC
HCT: 46.8 % — ABNORMAL HIGH (ref 36.0–46.0)
Hemoglobin: 15.6 g/dL — ABNORMAL HIGH (ref 12.0–15.0)
MCHC: 33.3 g/dL (ref 30.0–36.0)
MCV: 98.4 fL (ref 78.0–100.0)
Platelets: 368 10*3/uL (ref 150–400)
RBC: 4.76 MIL/uL (ref 3.87–5.11)
RDW: 11.7 % (ref 11.5–15.5)
WBC: 8.3 10*3/uL (ref 4.0–10.5)

## 2010-07-28 LAB — PROTIME-INR
INR: 2.2 — ABNORMAL HIGH (ref 0.00–1.49)
Prothrombin Time: 25.6 seconds — ABNORMAL HIGH (ref 11.6–15.2)

## 2010-07-28 LAB — DIFFERENTIAL
Basophils Absolute: 0.3 10*3/uL — ABNORMAL HIGH (ref 0.0–0.1)
Basophils Relative: 3 % — ABNORMAL HIGH (ref 0–1)
Eosinophils Absolute: 0.2 10*3/uL (ref 0.0–0.7)
Eosinophils Relative: 2 % (ref 0–5)
Lymphocytes Relative: 28 % (ref 12–46)
Lymphs Abs: 2.3 10*3/uL (ref 0.7–4.0)
Monocytes Absolute: 0.9 10*3/uL (ref 0.1–1.0)
Monocytes Relative: 11 % (ref 3–12)
Neutro Abs: 4.6 10*3/uL (ref 1.7–7.7)
Neutrophils Relative %: 56 % (ref 43–77)

## 2010-08-18 ENCOUNTER — Encounter (HOSPITAL_BASED_OUTPATIENT_CLINIC_OR_DEPARTMENT_OTHER): Payer: PRIVATE HEALTH INSURANCE | Admitting: Hematology & Oncology

## 2010-08-18 ENCOUNTER — Other Ambulatory Visit: Payer: Self-pay | Admitting: Hematology & Oncology

## 2010-08-18 DIAGNOSIS — R109 Unspecified abdominal pain: Secondary | ICD-10-CM

## 2010-08-18 DIAGNOSIS — Z86718 Personal history of other venous thrombosis and embolism: Secondary | ICD-10-CM

## 2010-08-18 DIAGNOSIS — M549 Dorsalgia, unspecified: Secondary | ICD-10-CM

## 2010-08-18 DIAGNOSIS — D6859 Other primary thrombophilia: Secondary | ICD-10-CM

## 2010-08-18 DIAGNOSIS — R110 Nausea: Secondary | ICD-10-CM

## 2010-08-18 DIAGNOSIS — Z7901 Long term (current) use of anticoagulants: Secondary | ICD-10-CM

## 2010-08-18 LAB — CBC WITH DIFFERENTIAL (CANCER CENTER ONLY)
BASO#: 0.1 10*3/uL (ref 0.0–0.2)
BASO%: 0.9 % (ref 0.0–2.0)
EOS%: 4.4 % (ref 0.0–7.0)
Eosinophils Absolute: 0.3 10*3/uL (ref 0.0–0.5)
HCT: 41.3 % (ref 34.8–46.6)
HGB: 14.7 g/dL (ref 11.6–15.9)
LYMPH#: 2.2 10*3/uL (ref 0.9–3.3)
LYMPH%: 33.9 % (ref 14.0–48.0)
MCH: 32.9 pg (ref 26.0–34.0)
MCHC: 35.6 g/dL (ref 32.0–36.0)
MCV: 92 fL (ref 81–101)
MONO#: 0.9 10*3/uL (ref 0.1–0.9)
MONO%: 14.2 % — ABNORMAL HIGH (ref 0.0–13.0)
NEUT#: 3 10*3/uL (ref 1.5–6.5)
NEUT%: 46.6 % (ref 39.6–80.0)
Platelets: 352 10*3/uL (ref 145–400)
RBC: 4.47 10*6/uL (ref 3.70–5.32)
RDW: 13.2 % (ref 11.1–15.7)
WBC: 6.4 10*3/uL (ref 3.9–10.0)

## 2010-08-18 LAB — PROTIME-INR (CHCC SATELLITE)
INR: 2.3 (ref 2.0–3.5)
Protime: 27.6 Seconds — ABNORMAL HIGH (ref 10.6–13.4)

## 2010-08-19 LAB — COMPREHENSIVE METABOLIC PANEL
ALT: 25 U/L (ref 0–35)
AST: 28 U/L (ref 0–37)
Albumin: 4.3 g/dL (ref 3.5–5.2)
Alkaline Phosphatase: 87 U/L (ref 39–117)
BUN: 18 mg/dL (ref 6–23)
CO2: 24 mEq/L (ref 19–32)
Calcium: 9.7 mg/dL (ref 8.4–10.5)
Chloride: 105 mEq/L (ref 96–112)
Creatinine, Ser: 0.69 mg/dL (ref 0.40–1.20)
Glucose, Bld: 127 mg/dL — ABNORMAL HIGH (ref 70–99)
Potassium: 4.1 mEq/L (ref 3.5–5.3)
Sodium: 141 mEq/L (ref 135–145)
Total Bilirubin: 0.7 mg/dL (ref 0.3–1.2)
Total Protein: 7 g/dL (ref 6.0–8.3)

## 2010-08-19 LAB — VITAMIN D 25 HYDROXY (VIT D DEFICIENCY, FRACTURES): Vit D, 25-Hydroxy: 39 ng/mL (ref 30–89)

## 2010-08-30 NOTE — Procedures (Signed)
DUPLEX DEEP VENOUS EXAM - LOWER EXTREMITY   INDICATION:  Right lower extremity pain in calf.   HISTORY:  Edema:  No.  Trauma/Surgery:  No.  Pain:  Yes right lower extremity.  PE:  No.  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  Anticoagulants:  Coumadin.  Other:  History of anticardiolipin antibodies.   DUPLEX EXAM:                CFV   SFV   PopV  PTV    GSV                R  L  R  L  R  L  R   L  R  L  Thrombosis    o  o  o     o     o      o  Spontaneous   +  +  +     +     +      +  Phasic        +  +  +     +     +      +  Augmentation  +  +  +     +     +      +  Compressible  +  +  +     +     +      +  Competent     +  +  +     +     +      +   Legend:  + - yes  o - no  p - partial  D - decreased   IMPRESSION:  The right lower extremity venous system was imaged,  Dopplered and shows no evidence of deep venous thrombosis.   The left common femoral vein shows no evidence of deep venous  thrombosis.    _____________________________  Nelda Severe Kellie Simmering, M.D.   AS/MEDQ  D:  03/07/2007  T:  03/08/2007  Job:  [id number]

## 2010-09-02 NOTE — Discharge Summary (Signed)
NAMEBRIGITT, MCCLISH               ACCOUNT NO.:  [id number]   MEDICAL RECORD NO.:  [id number]          PATIENT TYPE:  INP   LOCATION:  4920                         FACILITY:  [facility]   PHYSICIAN:  Orson Ape. Weatherly, M.D.DATE OF BIRTH:  [birth year]   DATE OF ADMISSION:  01/22/2004  DATE OF DISCHARGE:  01/28/2004                                 DISCHARGE SUMMARY   DISCHARGE DIAGNOSES:  1.  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  2.  History of portal hypertension.  3.  History of fibromyalgia.  4.  Incisional hernia, left lateral subcostal incision.   PROCEDURE:  1.  Open cholecystectomy with cholangiogram and repair of incisional repair.  2.  ERCP and sphincterotomy with stone removal.   HISTORY OF PRESENT ILLNESS AND HOSPITAL COURSE:  Laurie Green is a 65-year-  old female referred to me by Dr. Burney Gauze for symptomatic gallstones.  The patient has a complex past history in that she had a sigmoid colectomy  for chronic diverticulitis years ago by Dr. Leafy Kindle and then probably about  four years ago was diagnosed with portal vein thrombosis and was found to  have some type of __________ glutens that were thought to be the etiology of  this problem.  The patient was started on chronic Coumadin and then followed  by Dr. 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  She had an  open splenectomy at [facility] approximately two years ago.  The patient has had  episodes of right upper quadrant pain, sometimes pain radiating to the back,  and this has been thought to be secondary to a fibromyalgia, but recently an  ultrasound of the gallbladder was obtained which showed multiple small  stones within a distended gallbladder.  I was asked to see the patient.  On  evaluation, she obviously had a very large caudate lobe of the liver.  A  repeat CT showed that there has been a marked progression of the portal  venous hypervascularity.  It was all up around the  distal third of the  gallbladder.  I discussed with Dr. Marin Olp that he said that really the  anticoagulation could be stopped in preparation for surgery, and the patient  desired that we repair the incisional hernia, which is the lateral third  aspect of her splenectomy incision, simultaneously.  The patient's Coumadin  was discontinued about five days prior to her surgery, and she was admitted  on October 7th.  She was taken to surgery, and through an open  cholecystectomy and a right subcostal incision.  The surgery was very  difficult because of the hypervascularity with large veins all around the  proximal third of the gallbladder.  We were able to ultimately control and  do a cholangiogram and noted that she has a common duct stone that was not  appreciated preoperatively.  The patient had a Blake drain placed and then  afterwards during the same operation repaired the lateral aspect of the  incisional hernia.  I did not place mesh.  She has a lot of increased  vascularity secondary to increased portal  venous pressure in all areas, and  there were a lot of dense adhesions up where the hernia was.  Postoperatively the following day, she was on PCA morphine and this was  switched to Dilaudid.  The liver function studies bumped up significantly  abnormal.  They had been essentially normal preoperatively, with the total  bilirubin going to about 4.2 I think it was, with an SGOT, alkaline  phosphatase, and PT also being up.  I had asked Dr. Amedeo Plenty to see her, and he  got Dr. Clarene Essex.  The following day, however, her bilirubin was back  down, and we wondered if she had actually passed the common duct stone, but  the third day after surgery, her bilirubin was 1.4 and she was still kind of  having vague episodes of pain.  I thought since the enzymes were higher than  they had been preoperatively that it would be best to proceed on with ERCP.  Dr. Excell Seltzer who assisted on the surgery and I  thought that if we were  trying to do an open common duct exploration with these enormous large veins  with neovascularity around the common bile duct that we would really get  into uncontrollable bleeding.  We had actually transfused her 2 units of  blood during surgery.  Her preoperative hematocrit was 44 and was 36  postoperatively.  Dr. Watt Climes did do an ERCP and sphincterotomy and removed a  stone in the distal common bile duct on Tuesday and had no significant  problems with bleeding at that time.  Her liver tests are continuing to  improve.  The total bilirubin was 1.3.  The SGOT and PT are mildly elevated  at 43 and 121, but her alkaline phosphatase is 192, and they should return  to normal.  Her JP drain never put out any bilious drainage and has been  removed.  The staples have been removed, with the wound Steri-Striped.  She  is doing satisfactory.  She is having good bowel movements and has been  advanced to a low fat diet and is taking Ultram for pain.   FOLLOW UP:  She will see me in the office in approximately one week.  We  restarted her Coumadin yesterday, and she will have followup prothrombin  time drawn at Dr. Antonieta Pert office early next week.   DISCHARGE INSTRUCTIONS:  She is aware of the importance to keep herself very  well-hydrated and will see me for a wound check towards the end of the week.      WJW/MEDQ  D:  01/28/2004  T:  01/28/2004  Job:  [id number]   cc:   Jeryl Columbia, M.D.  [address] [place]  [place] [address]  Fax: [phone]   Rudell Cobb. Marin Olp, M.D.  [address]  [place], [place] [address]  Fax: [phone]

## 2010-09-02 NOTE — H&P (Signed)
NAMEASANI, DENISTON NO.:  [id number]   MEDICAL RECORD NO.:  [id number]          PATIENT TYPE:  INP   LOCATION:  0004                         FACILITY:  [facility]   PHYSICIAN:  Orson Ape. Weatherly, M.D.DATE OF BIRTH:  [birth year]   DATE OF ADMISSION:  01/22/2004  DATE OF DISCHARGE:                                HISTORY & PHYSICAL   CHIEF COMPLAINT:  Gallstones.   HISTORY:  Laurie Green is a 65 year old female referred to me by Dr. Burney Gauze for symptomatic gallstones. The patient has a complex past history  in that she had a sigmoid colectomy for chronic diverticulitis by Dr. Leafy Kindle  years ago and then probably about four years ago was diagnosed as portal  vein thrombosis and was found to have some type of cold agglutinin. The  explanation of the portal vein thrombosis was never definitely determined  but she was started on chronic Coumadin. Dr. Marin Olp is following her and  then she developed secondary hypersplenism and required an open splenectomy  at [facility] about two years ago.  Recently she has been having more episodes of  right upper quadrant pain. She has been previously diagnosed as fibromyalgia  and has had episodes of epigastric, back, difficult to localize and in  retrospect may have passed common duct stones but these have never been  documented by enzymes.  I obtained a CT to compare it with the one she had  had about 2 1/2 years ago and the neovascularity is certainly significant  more because that lobe of the liver is very large and in doing the  cholecystectomy thought there to be no way this could be done with the  laparoscope since there is numerous very large veins wrapping the proximal  third of the gallbladder. I therefore recommended we do it with open  cholecystectomy, the patient was in agreement. She also has a hernia in the  lateral aspect where she had the splenectomy about 2 1/2 years ago and would  like for [place] to try and repair that  simultaneously.   CHRONIC MEDICATIONS:  Normally she is on 7.5 alternating with 5 of Coumadin  of course that was stopped about six days ago and her prothrombin time is  normal.  She is on  Lotrel 5-10 mg in the morning, Prilosec over the counter  20 mg in the morning, __________ and Tylenol arthritis, 2 at hour of sleep.   ALLERGIES:  She states that the CAID anesthesia makes her throat swell.  CODEINE causes hives and nausea and vomiting.   REVIEW OF SYMPTOMS:  Documented on the help section level 2, mainly more  related to the portal vein thrombosis and workup, etc..   PAST SURGICAL HISTORY:  She has had a sigmoid colectomy about 10 years ago.  She had a splenectomy about 2.5 years ago at [facility].   PHYSICAL EXAMINATION:  GENERAL:  She was a pleasant female who appears her  stated age.  VITAL SIGNS:  Temperature was 97.2, heart rate 68, respirations 16, blood  pressure is 142/80.  She is 5 feet 3 1/2 inches  and weighs 160 pounds.  HEENT:  Normocephalic, pupils equal and reactive, well hydrated. No evidence  of cervical lymphadenopathy. Good breath sounds.  BREASTS:  Negative.  CARDIAC:  Normal sinus rhythm.  ABDOMEN:  Large midline incision. There is a large subcostal incision on the  left with bulging about a lemon size mass right at the lateral aspect of the  incision. On the right, she is not acutely tender and the liver does not  appear to be enlarged. The lower abdomen is unremarkable.  EXTREMITIES:  There is good peripheral pulses and no pedal edema.  CNS:  Physiologic.   IMPRESSION:  1.  Chronic cholecystitis in a patient with portal vein thrombosis with      secondary neovascularity compensation for the portal vein thrombosis.  2.  Incisional hernia.   PLAN:  Will plan an open cholecystectomy and repair of the incisional  hernia. We will do a cholangiogram.  Her preoperative liver function studies  are normal and her CBC showed a white count of 6.2 and a hematocrit of  44.2.      WJW/MEDQ  D:  01/22/2004  T:  01/22/2004  Job:  [id number]

## 2010-09-02 NOTE — Consult Note (Signed)
Laurie Green, Laurie Green NO.:  [id number]   MEDICAL RECORD NO.:  [id number]          PATIENT TYPE:  INP   LOCATION:  4801                         FACILITY:  [facility]   PHYSICIAN:  Jeryl Columbia, M.D.    DATE OF BIRTH:  [birth year]   DATE OF CONSULTATION:  01/22/2004  DATE OF DISCHARGE:                                   CONSULTATION   HISTORY:  Patient is seen at the request of Dr. Rise Patience for a positive  intraoperative cholangiogram.  This was reviewed with Luberta Robertson of  radiology.  Pertinent for a 3-4 mm stone and about a 1 cm duct.  She today  underwent an open cholecystectomy for biliary colic.   Her past medical history is pertinent for esophageal stricture and  dilatation years ago.  She also has a clotting disorder and a portal vein  thrombus and secondary hypersplenism.  She has also had a sigmoid colectomy  by Dr. Leafy Kindle for chronic diverticulitis.  She has been diagnosed with  fibromyalgia and has also had a splenectomy.   Current medicines include Coumadin, Lotrel, Prilosec, __________ and Tylenol  Arthritis.   ALLERGIES:  1.  MORPHINE, causing hallucinations.  2.  DEMEROL, nausea and vomiting.  3.  CAINE medicines make her throat swell.  4.  CODEINE causes hives and nausea.   SOCIAL HISTORY:  Pertinent for does not drink and does not smoke.   FAMILY HISTORY:  Please see my office chart for details.   PHYSICAL EXAMINATION:  GENERAL:  Patient is sleeping postop.  Not disturbed.   LABS:  Pertinent for preop normal liver tests.  Platelet count 206, white  count 15.1, postop.  Hemoglobin dropped from 15 to 12.  Normal coags with  her Coumadin being stopped.  Normal BUN and creatinine.   ASSESSMENT:  1.  Clotting disorder with portal vein thrombosis, status post splenectomy.  2.  History of esophageal stricture in the past.  3.  Recent cholecystectomy with positive intraoperative cholangiogram.   PLAN:  Risks, benefits and methods of the ERCP were  discussed with the  husband and the family.  We also talked about leaving the stone in an the  risks of that.  We answered all of their questions.  I would proceed p.r.n.  signs of  acute pancreatitis or cholangitis or if the stone was thought to be causing  symptoms or as an outpatient in 1-2 weeks when she is recovering from the  surgery and will be able to position her properly.  Will follow with you.  Thank you very much for the consult.      MEM/MEDQ  D:  01/22/2004  T:  01/22/2004  Job:  [id number]   cc:   Orson Ape. Rise Patience, M.D.  [address], [place]  [place] [address]  Fax: [place]. Marin Olp, M.D.  [address], [place] [address]  Fax: [place]. Rolla Flatten., M.D.  [address] [place]  [place]  [place] [address]  Fax: [phone]

## 2010-09-02 NOTE — Op Note (Signed)
[place]  Patient:    Laurie Green, Laurie Green                      MRN: [id number] Proc. Date: 09/23/99 Adm. Date:  [id number] Disc. Date: [id number] Attending:  Rocky Crafts                           Operative Report  PROCEDURE:  Left posterior iliac crest bone marrow biopsy/aspirate.  DESCRIPTION OF PROCEDURE:  This was performed in the recovery room/short stay unit. She had an IV placed.  She was placed on a monitor with supplemental oxygen. All of her vital signs were stable.  I went ahead and gave her a total of 6 mg of Versed IV for sedation.  I then prepped and draped the left posterior iliac crest region.  She was on her right  side.  10 cc of lidocaine was infiltrated down to the periosteum.  A good biopsy and aspirate were obtained without difficulty.  There was no excessive bleeding. DD:  09/23/99 TD:  09/27/99 Job: [id number]

## 2010-09-02 NOTE — Procedures (Signed)
[place]  Patient:    Laurie Green, Laurie Green                      MRN: [id number] Proc. Date: 06/21/99 Adm. Date:  [id number] Attending:  Sherrin Daisy CC:         Deborra Medina. Lenna Gilford, M.D. [facility]                           Procedure Report  PROCEDURE:  Colonoscopy.  ENDOSCOPIST:  Joyice Faster. Oletta Lamas, M.D.  MEDICATION:  Patient received no oral spray due to question of allergy to the oral spray -- anything with a "caine" in it, she apparently had allergic reactions and had to have medications.  She received Fentanyl 60 mg and Versed 8 mg.  INDICATION:  Persistent epigastric pain and heme-positive stool.  DESCRIPTION OF PROCEDURE:  Procedure had been explained to patient and consent obtained.  With the patient in the left lateral decubitus position and the Olympus videoendoscope, the scope was inserted blindly into the esophagus and advanced under direct visualization.  Patient did gag somewhat due to her lack of topical anesthesia.  We were able to advance down into the stomach where the pylorus was identified and passed.  The duodenum including the bulb and second portion was een well with no ulceration or inflammation.  Scope was withdrawn back into the stomach and the antrum was carefully examined, revealing very streaky-red gastritis that was very prominent.  Biopsy was taken for rapid urease test for Helicobacter. he antrum and body of the stomach were carefully examined and were free of any lesions.  There were no ulcers in the stomach.  The fundus and cardia were seen  well on the retroflexed view and were normal.  There was a hiatal hernia and a small amount of esophageal reflux.  The distal and proximal esophagus was normal. The scope was withdrawn.  The patient tolerated the procedure well and was maintained on low-flow oxygen and pulse oximetry throughout the procedure with o obvious problem.  ASSESSMENT:  Streaky gastritis,  probably due to nonsteroidal drugs or possibly Helicobacter that could account for her positive stool and her pain.  PLAN:  We will continue her on Prilosec, as she is on now, and will check the results of the tests for Helicobacter and will see back in the office in two to  three weeks. DD:  06/21/99 TD:  06/22/99 Job: [id number]

## 2010-09-02 NOTE — Op Note (Signed)
Laurie Green, MCMURRY               ACCOUNT NO.:  [id number]   MEDICAL RECORD NO.:  [id number]          PATIENT TYPE:  AMB   LOCATION:  ENDO                         FACILITY:  [facility]   PHYSICIAN:  James L. Rolla Flatten., M.D.DATE OF BIRTH:  [birth year]   DATE OF PROCEDURE:  08/17/2004  DATE OF DISCHARGE:                                 OPERATIVE REPORT   PROCEDURE:  Esophagogastroduodenoscopy.   MEDICATIONS:  Cetacaine spray and fentanyl 15 mcg and Versed 50 mg IV.   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  A recent CT scan done by Dr. 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  The patient has not had any  previous variceal bleed and is on Coumadin.  Due to the fact that she is on  Coumadin and is at risk for variceal bleed, an endoscopy is performed.   DESCRIPTION OF PROCEDURE:  In the left lateral decubitus position, the scope  was inserted and advanced.  The distal esophagus was reached.  There was no  evidence of gross varices at all.  The esophagus flattened out when air was  insufflated.  We passed on down to the stomach.  The duodenum was seen well  and was normal as was the port channel and the antrum.  The fundus and  cardia were seen in the retroflexed view.  There was no evidence of gastric  varices.  The scope was withdrawn back into the esophagus and the distal  esophagus was seen well.  The esophagus again revealed no evidence of  esophageal varices.  The proximal esophagus was normal.  The scope was  withdrawn.  The patient tolerated the procedure well.   ASSESSMENT:  Abnormal CT suggesting varices, but none seen on endoscopy.  793.4   PLAN:  1.  Will stop the propranolol.  2.  Resume Coumadin today.  3.  Follow up back in the office in two  months.      JLE/MEDQ  D:  08/17/2004  T:  08/17/2004  Job:  [id number]   cc:   Rudell Cobb. Marin Olp, M.D.  [address]  [place], [place] [address]  Fax: [phone]

## 2010-09-02 NOTE — Op Note (Signed)
Laurie Green, Laurie Green               ACCOUNT NO.:  [id number]   MEDICAL RECORD NO.:  [id number]          PATIENT TYPE:  INP   LOCATION:  2951                         FAC[facility]   PHYSICIAN:  Jeryl Columbia, M.D.    DATE OF BIRTH:  1945-06-03   DATE OF PROCEDURE:  DATE OF DISCHARGE:                                 OPERATIVE REPORT   PROCEDURE:  Endoscopic retrograde cholangiopancreatography, sphincterotomy  and stone extraction.   INDICATIONS FOR PROCEDURE:  Probable common bile duct stone.   Consent was signed after risks, benefits, methods, and options were  thoroughly discussed with both myself over the last few days and by Dr.  Amedeo Plenty prior to any premeds given.   MEDICINES USED:  Fentanyl 75 mcg, Versed 7 mg.   DESCRIPTION OF PROCEDURE:  The side viewing therapeutic video duodenoscope  was inserted by indirect vision into the stomach, advanced through a normal  antrum and pylorus into the duodenum to a normal appearing ampulla was  brought into view. Using the triple lumen sphincterotome, the CBD was  cannulated on the initial injection and seemed to be filling both the PD and  the CBD.  We did go ahead and advance the JAG wire which did preferentially  go into the CBD and we were able to get deep selective cannulation. The PD  was only minimally injected and not over filled. On initial injection, the  CBD stone was confirmed, the intrahepatic's appeared normal although she may  have had an extra auxiliary duct.  No obvious other abnormalities were seen.  The intrahepatic's were not overly overfilled. We went ahead and proceed  with a moderate size sphincterotomy in the customary fashion over the wire  until we were able to get excellent biliary drainage and a fully bowed  sphincterotome in and out of the duct. We went ahead and exchanged the  sphincterotome for an 8.5 mm balloon and on the initial withdrawal, the  stone was delivered. Two subsequent balloon pullthroughs  did not reveal any  further stones.  On the last one, we did an occlusion cholangiogram which  was normal. The balloon was pulled through the periampullary area without  any resistance, there was no bleeding and good drainage at the end of the  procedure. We elected to stop the procedure at this juncture, the scope was  removed. No blood was seen in the stomach nor any obvious varices.  Air was  suctioned, scope removed. The patient tolerated the procedure well. There  was no obvious or immediate complication.   ENDOSCOPIC DIAGNOSIS:  1.  Normal ampulla.  2.  One minimal pancreatic duct injection not overfilled.  3.  Common bile duct stone and otherwise normal intrahepatic's although      there may have been an extra-auxiliary duct therapy status post moderate      size sphincterotomy and three 8.5 balloon pullthroughs without      resistance, deliver the stone on the first one.  4.  Negative occlusion cholangiogram with good biliary drainage at the end      of the procedure.  PLAN:  Observe for delayed for complications if none can slowly advance diet  in the a.m.  Care with Coumadin. Would prefer not to restart it for 10-14  days because of the sphincterotomy but okay to cover her with Lovenox.  Dr.  Amedeo Plenty to see in the a.m. and will repeat labs.      MEM/MEDQ  D:  01/26/2004  T:  01/26/2004  Job:  [id number]   cc:   Orson Ape. Rise Patience, M.D.  [address], [place]  [place] [address]  Fax: [place]. Marin Olp, M.D.  [address], [place] [address]  Fax: [place]. Rolla Flatten., M.D.  [address], [place]  [place]  [place] [address]  Fax: [phone]

## 2010-09-02 NOTE — Op Note (Signed)
[name], [name]               ACCOUNT NO.:  [id number]   MEDICAL RECORD NO.:  [id number]          PATIENT TYPE:  INP   LOCATION:  8338                         FACILITY:  [facility]   PHYSICIAN:  Orson Ape. Weatherly, M.D.DATE OF BIRTH:  1945/07/25   DATE OF PROCEDURE:  01/22/2004  DATE OF DISCHARGE:                                 OPERATIVE REPORT   PREOPERATIVE DIAGNOSES:  1.  Chronic cholecystitis.  2.  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  3.  Incisional hernia.   OPERATION:  Open cholecystectomy with cholangiogram and also repair of an  incisional hernia.   ANESTHESIA:  General.   SURGEON:  Orson Ape. Rise Patience, M.D.   ASSISTANT:  Marland Kitchen T. Hoxworth, M.D.   HISTORY:  Laurie Green is a 65 year old female who I first saw  approximately six weeks ago when she was referred by Dr. Marin Olp for  gallstones.  The patient has a long, complicated history in that probably 10  years or so ago, she had a sigmoid colectomy with take-down of the splenic  flexure by Dr. Leafy Kindle.  She has had a chronic history of fibromyalgia and  then approximately 5-6 years ago, she was having vague abdominal pain and  noted to basically have a lot of vascularity in the common bile duct area.  No history of cirrhosis or excessive alcohol use, etc.  Then had a  percutaneous liver biopsy that was done at [facility] and showed normal liver.  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  The patient  recently has been having episodes of epigastric pain.  She has had episodes  of pain in the chest, back, etc., intermittently for several years, thought  to be probably secondary to this fibromyalgia, and then recently an  ultrasound was shown to have small gallstones.  The ultrasound  ultimately  showed a very large caudate lump in the liver, and I was asked to see her  and obtain a basically repeat CT.  She had one about the time that her  spleen was removed, and it showed significant portal vein thrombosis with  neovascularity at that time.  I recommended, as she was on chronic Coumadin  to prevent further problems with the portal vein thrombosis, and we stopped  this, in preparation for an open cholecystectomy and repair of the  incisional hernia.  Dr. Excell Seltzer will be assisting.   Preoperatively, I did place a Foley catheter, prepped her with Betadine  surgical scrub and solution and draped her in a sterile manner.  She had a  midline incision and also a left long subcostal incision, and I elected to  make a little right subcostal incision, where the big subcostal from the  splenectomy was done and extended it laterally.  Sharp dissection down  through the skin and subcuticular.  The right rectus muscle was divided with  cautery,  and then we very carefully opened into the peritoneal cavity.  The  gallbladder was chronically distended.  Numerous adhesions were around the  gallbladder.  We carefully dissected the gallbladder from the surrounding  omentum, working down proximally in the gallbladder.  There was a lot of  neovascularity down in the proximal third of the gallbladder, and what we  first thought was a cystic duct was probably a large vein.  We backed up  because of the, basically significant venous pressure down in this area and  took the gallbladder down in a retrograde manner and decompressed it so we  could work on it.  In the proximal portion, however, even in this manner,  there was still just basically a lot of neovascularity and where exactly was  the cystic duct, where was the common, it was difficult to be sure, and  these veins were very large, but they were all secondarily portal  hypertension and not the main veins in the portal triad area.   Finally,  after probably three of the veins had been either sutured with either 2-0  silk or ligated with 2-0 Vicryl, we were down then at the proximal neck of  the gallbladder and could identify the cystic duct.  A Reddick catheter was  placed in it, and a cholangiogram obtained.  She had probably about a 3 cm  cystic duct length still remaining, but there also still appears to be a  small stone in the distal common bile duct.  Her liver tests were normal  preoperatively, and the stone itself is not impacted at the ampulla area.  The right and left branches of the common bile duct are visualized.  We went  probably about 1 cm more proximal and ligated a large vein that was running  with the cystic duct stump and then sutured the distal end of the cystic  duct.  I did place a Blake drain.  The hemostasis on the liver itself was  not a problem, since she does not have basically a high venous pressure  within her liver itself.  I brought the [facility] drain out laterally and then  closed the peritoneum and anterior rectus fascia with a running 1-0 PDS and  then closed the anterior fascia with #1 Prolene.  The skin was closed with  staples.  Next, I directed my attention over to the left lateral, changed  gloves, instruments, etc., and opened up probably about a 3 inch segment of  the lateral part of the splenectomy incision.  The hernia sac was right in  the subcutaneous area.  I opened it and then kind of peeled the omentum and  colon that was caught up in this area.  I closed the posterior peritoneum  and fascia with an 0 Vicryl and then closed the anterior fascia with #1  Novofil to hopefully prevent further problems.  She has increased venous  pressure over this area, and I am sure that some of the lateral scarring is  used to decompress the portal hypertension, as everything required ligated  instead of just lightly cauterizing the area, which we normally can do.  The subcutaneous was closed  with 3-0 Vicryl and then the skin itself was closed  with staples.  The patient tolerated the procedure nicely and was extubated  and taken to the recovery room in a stable postoperative condition.  We will  go slow on her liquids.  We will get Dr. Amedeo Plenty or associates to see her  regarding possibly an ERCP.  I am going to hold off on the Coumadin until a  definite decision has been made.  The stone itself is not enlarged, and I  would not be surprised that she has not passed previous stones so that we  may elect to watch her and not proceed on with the ERCP because of the risks  of bleeding with sphincterotomy in this setting.      WJW/MEDQ  D:  01/22/2004  T:  01/22/2004  Job:  [id number]   cc:   Elyse Jarvis. Amedeo Plenty, M.D.  [address], [place]  [place] [address]  Fax: [phone]

## 2010-09-02 NOTE — Op Note (Signed)
NAMEGEORGIE, Laurie Green               ACCOUNT NO.:  [id number]   MEDICAL RECORD NO.:  [id number]          PATIENT TYPE:  AMB   LOCATION:  ENDO                         FACILITY:  [facility]   PHYSICIAN:  James L. Rolla Flatten., M.D.DATE OF BIRTH:  [birth year]   DATE OF PROCEDURE:  11/28/2004  DATE OF DISCHARGE:                                 OPERATIVE REPORT   PROCEDURE:  Colonoscopy.   MEDICATIONS:  Fentanyl 87.5 mcg, Versed 6 mg IV.   SCOPE:  Olympus pediatric adjustable colonoscope.   INDICATIONS:  Patient with continuing constipation.  She has had previous  diverticulitis and colon resection.  She has not had any type of colon  evaluation in about 12 years.   DESCRIPTION OF PROCEDURE:  The procedure has been explained to the patient  and consent obtained.  With the patient in the left lateral decubitus  position, the Olympus pediatric adjustable scope was inserted and advanced.  The prep was excellent.  We were able to reach the cecum without difficulty.  The ileocecal valve and appendiceal orifice seen.  The scope withdrawn.  Mucosa carefully examined.  No polyps seen throughout.  No diverticulosis  seen whatsoever.  A widely patent colon throughout.  No stricturing,  narrowing, etc.  Mucosal pattern all normal.  The rectum free of any  significant lesions.  A few small hemorrhoids.   ASSESSMENT:  1.  Essentially normal colonoscopy.  V76.51.  2.  Constipation, probably functional.  564.00.   PLAN:  We will keep the patient on MiraLax.  We will increase it to twice  daily if needed.  See back in the office in six weeks.  If she is not  improved, may consider Zelnorm but will resume her Coumadin today.           ______________________________  Joyice Faster. Rolla Flatten., M.D.     Jaynie Bream  D:  11/28/2004  T:  11/28/2004  Job:  [id number]   cc:   Frann Rider, M.D.  [place]  [place] [address]  Fax: [place] Marin Olp, M.D.  [address]  [place], [place] [address]  Fax: [phone]

## 2010-10-27 ENCOUNTER — Encounter (HOSPITAL_BASED_OUTPATIENT_CLINIC_OR_DEPARTMENT_OTHER): Payer: PRIVATE HEALTH INSURANCE | Admitting: Hematology & Oncology

## 2010-10-27 ENCOUNTER — Other Ambulatory Visit: Payer: Self-pay | Admitting: Hematology & Oncology

## 2010-10-27 DIAGNOSIS — Z86718 Personal history of other venous thrombosis and embolism: Secondary | ICD-10-CM

## 2010-10-27 DIAGNOSIS — K900 Celiac disease: Secondary | ICD-10-CM

## 2010-10-27 DIAGNOSIS — Z7901 Long term (current) use of anticoagulants: Secondary | ICD-10-CM

## 2010-10-27 DIAGNOSIS — D6859 Other primary thrombophilia: Secondary | ICD-10-CM

## 2010-10-27 LAB — CBC WITH DIFFERENTIAL (CANCER CENTER ONLY)
BASO#: 0 10*3/uL (ref 0.0–0.2)
BASO%: 0.4 % (ref 0.0–2.0)
EOS%: 1.6 % (ref 0.0–7.0)
Eosinophils Absolute: 0.1 10*3/uL (ref 0.0–0.5)
HCT: 43.3 % (ref 34.8–46.6)
HGB: 15.9 g/dL (ref 11.6–15.9)
LYMPH#: 1.6 10*3/uL (ref 0.9–3.3)
LYMPH%: 21.7 % (ref 14.0–48.0)
MCH: 34.6 pg — ABNORMAL HIGH (ref 26.0–34.0)
MCHC: 36.7 g/dL — ABNORMAL HIGH (ref 32.0–36.0)
MCV: 94 fL (ref 81–101)
MONO#: 1 10*3/uL — ABNORMAL HIGH (ref 0.1–0.9)
MONO%: 13.6 % — ABNORMAL HIGH (ref 0.0–13.0)
NEUT#: 4.7 10*3/uL (ref 1.5–6.5)
NEUT%: 62.7 % (ref 39.6–80.0)
Platelets: 324 10*3/uL (ref 145–400)
RBC: 4.6 10*6/uL (ref 3.70–5.32)
RDW: 13.1 % (ref 11.1–15.7)
WBC: 7.5 10*3/uL (ref 3.9–10.0)

## 2010-10-27 LAB — COMPREHENSIVE METABOLIC PANEL
ALT: 27 U/L (ref 0–35)
AST: 26 U/L (ref 0–37)
Albumin: 4.1 g/dL (ref 3.5–5.2)
Alkaline Phosphatase: 97 U/L (ref 39–117)
BUN: 17 mg/dL (ref 6–23)
CO2: 28 mEq/L (ref 19–32)
Calcium: 9.8 mg/dL (ref 8.4–10.5)
Chloride: 102 mEq/L (ref 96–112)
Creatinine, Ser: 0.77 mg/dL (ref 0.50–1.10)
Glucose, Bld: 214 mg/dL — ABNORMAL HIGH (ref 70–99)
Potassium: 4.1 mEq/L (ref 3.5–5.3)
Sodium: 141 mEq/L (ref 135–145)
Total Bilirubin: 1.5 mg/dL — ABNORMAL HIGH (ref 0.3–1.2)
Total Protein: 6.7 g/dL (ref 6.0–8.3)

## 2010-10-27 LAB — LACTATE DEHYDROGENASE: LDH: 229 U/L (ref 94–250)

## 2010-10-27 LAB — PROTIME-INR (CHCC SATELLITE)
INR: 2 (ref 2.0–3.5)
Protime: 24 Seconds — ABNORMAL HIGH (ref 10.6–13.4)

## 2010-10-27 LAB — VITAMIN D 25 HYDROXY (VIT D DEFICIENCY, FRACTURES): Vit D, 25-Hydroxy: 49 ng/mL (ref 30–89)

## 2010-12-21 ENCOUNTER — Other Ambulatory Visit: Payer: Self-pay | Admitting: Hematology & Oncology

## 2010-12-21 ENCOUNTER — Encounter (HOSPITAL_BASED_OUTPATIENT_CLINIC_OR_DEPARTMENT_OTHER): Payer: PRIVATE HEALTH INSURANCE | Admitting: Hematology & Oncology

## 2010-12-21 DIAGNOSIS — K900 Celiac disease: Secondary | ICD-10-CM

## 2010-12-21 DIAGNOSIS — R894 Abnormal immunological findings in specimens from other organs, systems and tissues: Secondary | ICD-10-CM

## 2010-12-21 DIAGNOSIS — Z7901 Long term (current) use of anticoagulants: Secondary | ICD-10-CM

## 2010-12-21 DIAGNOSIS — Z86718 Personal history of other venous thrombosis and embolism: Secondary | ICD-10-CM

## 2010-12-21 DIAGNOSIS — D6859 Other primary thrombophilia: Secondary | ICD-10-CM

## 2010-12-21 DIAGNOSIS — M549 Dorsalgia, unspecified: Secondary | ICD-10-CM

## 2010-12-21 LAB — PROTIME-INR (CHCC SATELLITE)
INR: 1.7 — ABNORMAL LOW (ref 2.0–3.5)
Protime: 20.4 Seconds — ABNORMAL HIGH (ref 10.6–13.4)

## 2010-12-21 LAB — CBC WITH DIFFERENTIAL (CANCER CENTER ONLY)
BASO#: 0 10*3/uL (ref 0.0–0.2)
BASO%: 0.6 % (ref 0.0–2.0)
EOS%: 2.8 % (ref 0.0–7.0)
Eosinophils Absolute: 0.2 10*3/uL (ref 0.0–0.5)
HCT: 42.4 % (ref 34.8–46.6)
HGB: 15.3 g/dL (ref 11.6–15.9)
LYMPH#: 1.8 10*3/uL (ref 0.9–3.3)
LYMPH%: 25.7 % (ref 14.0–48.0)
MCH: 34.2 pg — ABNORMAL HIGH (ref 26.0–34.0)
MCHC: 36.1 g/dL — ABNORMAL HIGH (ref 32.0–36.0)
MCV: 95 fL (ref 81–101)
MONO#: 0.8 10*3/uL (ref 0.1–0.9)
MONO%: 11.6 % (ref 0.0–13.0)
NEUT#: 4.1 10*3/uL (ref 1.5–6.5)
NEUT%: 59.3 % (ref 39.6–80.0)
Platelets: 335 10*3/uL (ref 145–400)
RBC: 4.47 10*6/uL (ref 3.70–5.32)
RDW: 12.9 % (ref 11.1–15.7)
WBC: 6.8 10*3/uL (ref 3.9–10.0)

## 2010-12-21 LAB — CMP (CANCER CENTER ONLY)
ALT(SGPT): 33 U/L (ref 10–47)
AST: 39 U/L — ABNORMAL HIGH (ref 11–38)
Albumin: 3.5 g/dL (ref 3.3–5.5)
Alkaline Phosphatase: 74 U/L (ref 26–84)
BUN, Bld: 12 mg/dL (ref 7–22)
CO2: 31 mEq/L (ref 18–33)
Calcium: 9.3 mg/dL (ref 8.0–10.3)
Chloride: 100 mEq/L (ref 98–108)
Creat: 0.7 mg/dl (ref 0.6–1.2)
Glucose, Bld: 131 mg/dL — ABNORMAL HIGH (ref 73–118)
Potassium: 4.4 mEq/L (ref 3.3–4.7)
Sodium: 136 mEq/L (ref 128–145)
Total Bilirubin: 1.5 mg/dl (ref 0.20–1.60)
Total Protein: 7.1 g/dL (ref 6.4–8.1)

## 2010-12-21 LAB — FERRITIN: Ferritin: 54 ng/mL (ref 10–291)

## 2010-12-23 ENCOUNTER — Emergency Department (HOSPITAL_BASED_OUTPATIENT_CLINIC_OR_DEPARTMENT_OTHER)
Admission: EM | Admit: 2010-12-23 | Discharge: 2010-12-23 | Disposition: A | Discharge status: Home / Self Care | Payer: PRIVATE HEALTH INSURANCE | Attending: Emergency Medicine | Admitting: Emergency Medicine

## 2010-12-23 ENCOUNTER — Encounter: Payer: Self-pay | Admitting: Emergency Medicine

## 2010-12-23 DIAGNOSIS — D6861 Antiphospholipid syndrome: Secondary | ICD-10-CM

## 2010-12-23 DIAGNOSIS — I10 Essential (primary) hypertension: Secondary | ICD-10-CM | POA: Insufficient documentation

## 2010-12-23 DIAGNOSIS — M79609 Pain in unspecified limb: Secondary | ICD-10-CM | POA: Insufficient documentation

## 2010-12-23 DIAGNOSIS — Z8679 Personal history of other diseases of the circulatory system: Secondary | ICD-10-CM | POA: Insufficient documentation

## 2010-12-23 DIAGNOSIS — R791 Abnormal coagulation profile: Secondary | ICD-10-CM

## 2010-12-23 DIAGNOSIS — M79606 Pain in leg, unspecified: Secondary | ICD-10-CM

## 2010-12-23 DIAGNOSIS — R229 Localized swelling, mass and lump, unspecified: Secondary | ICD-10-CM | POA: Insufficient documentation

## 2010-12-23 DIAGNOSIS — D6859 Other primary thrombophilia: Secondary | ICD-10-CM | POA: Insufficient documentation

## 2010-12-23 DIAGNOSIS — R2240 Localized swelling, mass and lump, unspecified lower limb: Secondary | ICD-10-CM

## 2010-12-23 HISTORY — DX: Essential (primary) hypertension: I10

## 2010-12-23 HISTORY — DX: Antiphospholipid syndrome: D68.61

## 2010-12-23 HISTORY — DX: Diverticulitis of intestine, part unspecified, without perforation or abscess without bleeding: K57.92

## 2010-12-23 HISTORY — DX: Transient cerebral ischemic attack, unspecified: G45.9

## 2010-12-23 LAB — CBC
HCT: 41.3 % (ref 36.0–46.0)
Hemoglobin: 14.8 g/dL (ref 12.0–15.0)
MCH: 33.9 pg (ref 26.0–34.0)
MCHC: 35.8 g/dL (ref 30.0–36.0)
MCV: 94.7 fL (ref 78.0–100.0)
Platelets: 336 10*3/uL (ref 150–400)
RBC: 4.36 MIL/uL (ref 3.87–5.11)
RDW: 12.8 % (ref 11.5–15.5)
WBC: 7.6 10*3/uL (ref 4.0–10.5)

## 2010-12-23 LAB — BASIC METABOLIC PANEL
BUN: 18 mg/dL (ref 6–23)
CO2: 29 mEq/L (ref 19–32)
Calcium: 9.8 mg/dL (ref 8.4–10.5)
Chloride: 103 mEq/L (ref 96–112)
Creatinine, Ser: 0.8 mg/dL (ref 0.50–1.10)
GFR calc Af Amer: >60 mL/min (ref >60)
GFR calc non Af Amer: >60 mL/min (ref >60)
Glucose, Bld: 214 mg/dL — ABNORMAL HIGH (ref 70–99)
Potassium: 3.6 mEq/L (ref 3.5–5.1)
Sodium: 141 mEq/L (ref 135–145)

## 2010-12-23 LAB — DIFFERENTIAL
Basophils Absolute: 0 10*3/uL (ref 0.0–0.1)
Basophils Relative: 1 % (ref 0–1)
Eosinophils Absolute: 0.2 10*3/uL (ref 0.0–0.7)
Eosinophils Relative: 2 % (ref 0–5)
Lymphocytes Relative: 36 % (ref 12–46)
Lymphs Abs: 2.7 10*3/uL (ref 0.7–4.0)
Monocytes Absolute: 0.9 10*3/uL (ref 0.1–1.0)
Monocytes Relative: 11 % (ref 3–12)
Neutro Abs: 3.8 10*3/uL (ref 1.7–7.7)
Neutrophils Relative %: 50 % (ref 43–77)

## 2010-12-23 LAB — PROTIME-INR
INR: 1.55 — ABNORMAL HIGH (ref 0.00–1.49)
Prothrombin Time: 18.9 seconds — ABNORMAL HIGH (ref 11.6–15.2)

## 2010-12-23 MED ORDER — ENOXAPARIN SODIUM 150 MG/ML ~~LOC~~ SOLN
1.0000 mg/kg | Freq: Once | SUBCUTANEOUS | Status: AC
  Administered 2010-12-23: 67 mg via SUBCUTANEOUS

## 2010-12-23 MED ORDER — ENOXAPARIN SODIUM 150 MG/ML ~~LOC~~ SOLN: 80.0000 mg | Freq: Two times a day (BID) | SUBCUTANEOUS | Status: AC

## 2010-12-23 MED ORDER — ENOXAPARIN SODIUM 100 MG/ML ~~LOC~~ SOLN
SUBCUTANEOUS | Status: AC
  Filled 2010-12-23: qty 1

## 2010-12-23 NOTE — ED Notes (Signed)
Md made aware and at bedside to eval pt

## 2010-12-23 NOTE — ED Notes (Addendum)
Pt c/o left lower leg pain with swelling. Pt concerned about DVT. Pt INR 1.7 wed. Pt also c/o right hip pain.

## 2010-12-23 NOTE — ED Provider Notes (Addendum)
History     CSN: [id number] Arrival date & time: 12/23/2010  8:16 PM  Chief Complaint  Patient presents with  . Leg Pain   Patient is a 65 y.o. female presenting with leg pain. The history is provided by the patient.  Leg Pain  The incident occurred 3 to 5 hours ago. There was no injury mechanism. The pain is present in the left leg (Pain is in the anterior aspect of the left lower leg). The quality of the pain is described as aching. The pain is at a severity of 6/10. The pain is moderate. The pain has been constant since onset. Pertinent negatives include no numbness and no muscle weakness. The symptoms are aggravated by bearing weight. She has tried nothing for the symptoms.  She has a history of anticardiolipin antibody and is on Coumadin. Last INR was two days ago and was subtherapeutic (1.7 - usually is kept over 2.5).  Past Medical History  Diagnosis Date  . TIA (transient ischemic attack)   . Hypertension   . Anti-cardiolipin antibody syndrome   . Diverticulitis     Past Surgical History  Procedure Date  . Colon surgery   . Abdominal hysterectomy   . Splenectomy, total   . Cholecystectomy   . Hernia repair     History reviewed. No pertinent family history.  History  Substance Use Topics  . Smoking status: Never Smoker   . Smokeless tobacco: Not on file  . Alcohol Use: No    OB History    Grav Para Term Preterm Abortions TAB SAB Ect Mult Living                  Review of Systems  Neurological: Negative for numbness.  All other systems reviewed and are negative.    Physical Exam  BP 157/71  Pulse 63  Temp(Src) 98.3 F (36.8 C) (Oral)  Resp 18  SpO2 97%  Physical Exam  Nursing note and vitals reviewed. Constitutional: She is oriented to person, place, and time. She appears well-developed and well-nourished. No distress.  HENT:  Head: Normocephalic and atraumatic.  Right Ear: External ear normal.  Left Ear: External ear normal.  Mouth/Throat:  Oropharynx is clear and moist.  Eyes: Conjunctivae and EOM are normal. Pupils are equal, round, and reactive to light. No scleral icterus.  Neck: Normal range of motion. Neck supple. No JVD present.  Cardiovascular: Normal rate, regular rhythm and normal heart sounds.   No murmur heard. Pulmonary/Chest: Effort normal and breath sounds normal. She has no wheezes. She has no rales. She exhibits no tenderness.  Abdominal: Soft. Bowel sounds are normal. She exhibits no mass. There is no tenderness.  Musculoskeletal: Normal range of motion. She exhibits no edema.       Localized swelling and tenderness left lower leg just lateral to the tibia. Distal pulses are strong and capillary refill is prompt. Negative Homan's Sign. Left calf circumference is 1 cm greater than right calf circumference.  Lymphadenopathy:    She has no cervical adenopathy.  Neurological: She is alert and oriented to person, place, and time. No cranial nerve deficit. Coordination normal.  Skin: Skin is warm and dry. No rash noted.  Psychiatric: She has a normal mood and affect. Her behavior is normal. Judgment and thought content normal.    ED Course  Procedures Given a dose of Lovenox. MDM Clinically low likelihood of DVT, but she is subtherapeutic on Coumadin. Does not qualify for the R/O DVT protocol because  of Anticardiolipin Syndrome. Discussed this with the patient. She has taken Lovenox in the past, and feels comfortable using it at home (her husband injects it and has done it in the past). Discussed with Dr. Truddie Coco who agrees that it is safe for her to go home. Venous doppler arranged for September 9.  Lab results reviewed.  Results for orders placed during the hospital encounter of 12/23/10  CBC      Component Value Range   WBC 7.6  4.0 - 10.5 (K/uL)   RBC 4.36  3.87 - 5.11 (MIL/uL)   Hemoglobin 14.8  12.0 - 15.0 (g/dL)   HCT 41.3  36.0 - 46.0 (%)   MCV 94.7  78.0 - 100.0 (fL)   MCH 33.9  26.0 - 34.0 (pg)   MCHC  35.8  30.0 - 36.0 (g/dL)   RDW 12.8  11.5 - 15.5 (%)   Platelets 336  150 - 400 (K/uL)  DIFFERENTIAL      Component Value Range   Neutrophils Relative 50  43 - 77 (%)   Neutro Abs 3.8  1.7 - 7.7 (K/uL)   Lymphocytes Relative 36  12 - 46 (%)   Lymphs Abs 2.7  0.7 - 4.0 (K/uL)   Monocytes Relative 11  3 - 12 (%)   Monocytes Absolute 0.9  0.1 - 1.0 (K/uL)   Eosinophils Relative 2  0 - 5 (%)   Eosinophils Absolute 0.2  0.0 - 0.7 (K/uL)   Basophils Relative 1  0 - 1 (%)   Basophils Absolute 0.0  0.0 - 0.1 (K/uL)  BASIC METABOLIC PANEL      Component Value Range   Sodium 141  135 - 145 (mEq/L)   Potassium 3.6  3.5 - 5.1 (mEq/L)   Chloride 103  96 - 112 (mEq/L)   CO2 29  19 - 32 (mEq/L)   Glucose, Bld 214 (*) 70 - 99 (mg/dL)   BUN 18  6 - 23 (mg/dL)   Creatinine, Ser 0.80  0.50 - 1.10 (mg/dL)   Calcium 9.8  8.4 - 10.5 (mg/dL)   GFR calc non Af Amer >60  >60 (mL/min)   GFR calc Af Amer >60  >60 (mL/min)  PROTIME-INR      Component Value Range   Prothrombin Time 18.9 (*) 11.6 - 15.2 (seconds)   INR 1.55 (*) 0.00 - 1.49    No results found.      Delora Fuel, MD 33/38/32 9191  Shlok Raz, MD 66/06/00 4599

## 2010-12-25 ENCOUNTER — Ambulatory Visit (HOSPITAL_BASED_OUTPATIENT_CLINIC_OR_DEPARTMENT_OTHER)
Admission: RE | Admit: 2010-12-25 | Discharge: 2010-12-25 | Disposition: A | Discharge status: Home / Self Care | Payer: PRIVATE HEALTH INSURANCE | Source: Ambulatory Visit | Attending: Emergency Medicine | Admitting: Emergency Medicine

## 2010-12-25 DIAGNOSIS — M7989 Other specified soft tissue disorders: Secondary | ICD-10-CM | POA: Insufficient documentation

## 2010-12-25 DIAGNOSIS — M79609 Pain in unspecified limb: Secondary | ICD-10-CM | POA: Insufficient documentation

## 2010-12-28 ENCOUNTER — Other Ambulatory Visit: Payer: Self-pay | Admitting: Hematology & Oncology

## 2010-12-28 ENCOUNTER — Encounter (HOSPITAL_BASED_OUTPATIENT_CLINIC_OR_DEPARTMENT_OTHER): Payer: PRIVATE HEALTH INSURANCE | Admitting: Hematology & Oncology

## 2010-12-28 DIAGNOSIS — D6859 Other primary thrombophilia: Secondary | ICD-10-CM

## 2010-12-28 DIAGNOSIS — Z79899 Other long term (current) drug therapy: Secondary | ICD-10-CM

## 2010-12-28 DIAGNOSIS — Z7901 Long term (current) use of anticoagulants: Secondary | ICD-10-CM

## 2010-12-28 DIAGNOSIS — Z86718 Personal history of other venous thrombosis and embolism: Secondary | ICD-10-CM

## 2010-12-28 LAB — PROTIME-INR (CHCC SATELLITE)
INR: 1.8 — ABNORMAL LOW (ref 2.0–3.5)
Protime: 21.6 Seconds — ABNORMAL HIGH (ref 10.6–13.4)

## 2011-01-16 LAB — DIFFERENTIAL
Basophils Absolute: 0.2 — ABNORMAL HIGH
Basophils Relative: 2 — ABNORMAL HIGH
Eosinophils Absolute: 0.2
Eosinophils Relative: 2
Lymphocytes Relative: 27
Lymphs Abs: 2.2
Monocytes Absolute: 0.8
Monocytes Relative: 10
Neutro Abs: 4.9
Neutrophils Relative %: 59

## 2011-01-16 LAB — BASIC METABOLIC PANEL
BUN: 20
CO2: 28
Calcium: 9.4
Chloride: 104
Creatinine, Ser: 0.8
GFR calc Af Amer: >60
GFR calc non Af Amer: >60
Glucose, Bld: 108 — ABNORMAL HIGH
Potassium: 4
Sodium: 143

## 2011-01-16 LAB — URINALYSIS, ROUTINE W REFLEX MICROSCOPIC
Bilirubin Urine: NEGATIVE
Glucose, UA: NEGATIVE
Hgb urine dipstick: NEGATIVE
Ketones, ur: NEGATIVE
Nitrite: NEGATIVE
Protein, ur: NEGATIVE
Specific Gravity, Urine: 1.007
Urobilinogen, UA: 0.2
pH: 6.5

## 2011-01-16 LAB — PROTIME-INR
INR: 2.1 — ABNORMAL HIGH
Prothrombin Time: 25 — ABNORMAL HIGH

## 2011-01-16 LAB — CBC
HCT: 47.5 — ABNORMAL HIGH
Hemoglobin: 16.7 — ABNORMAL HIGH
MCHC: 35.2
MCV: 95.7
Platelets: 357
RBC: 4.97
RDW: 12.3
WBC: 8.3

## 2011-02-02 ENCOUNTER — Other Ambulatory Visit: Payer: Self-pay | Admitting: Hematology & Oncology

## 2011-02-02 ENCOUNTER — Encounter (HOSPITAL_BASED_OUTPATIENT_CLINIC_OR_DEPARTMENT_OTHER): Payer: PRIVATE HEALTH INSURANCE | Admitting: Hematology & Oncology

## 2011-02-02 DIAGNOSIS — Z7901 Long term (current) use of anticoagulants: Secondary | ICD-10-CM

## 2011-02-02 DIAGNOSIS — D6859 Other primary thrombophilia: Secondary | ICD-10-CM

## 2011-02-02 DIAGNOSIS — Z86718 Personal history of other venous thrombosis and embolism: Secondary | ICD-10-CM

## 2011-02-02 DIAGNOSIS — M549 Dorsalgia, unspecified: Secondary | ICD-10-CM

## 2011-02-02 DIAGNOSIS — K900 Celiac disease: Secondary | ICD-10-CM

## 2011-02-02 LAB — CBC WITH DIFFERENTIAL (CANCER CENTER ONLY)
BASO#: 0 10*3/uL (ref 0.0–0.2)
BASO%: 0.5 % (ref 0.0–2.0)
EOS%: 2.4 % (ref 0.0–7.0)
Eosinophils Absolute: 0.2 10*3/uL (ref 0.0–0.5)
HCT: 40.9 % (ref 34.8–46.6)
HGB: 15 g/dL (ref 11.6–15.9)
LYMPH#: 2.2 10*3/uL (ref 0.9–3.3)
LYMPH%: 28.5 % (ref 14.0–48.0)
MCH: 34.2 pg — ABNORMAL HIGH (ref 26.0–34.0)
MCHC: 36.7 g/dL — ABNORMAL HIGH (ref 32.0–36.0)
MCV: 93 fL (ref 81–101)
MONO#: 0.9 10*3/uL (ref 0.1–0.9)
MONO%: 12.3 % (ref 0.0–13.0)
NEUT#: 4.3 10*3/uL (ref 1.5–6.5)
NEUT%: 56.3 % (ref 39.6–80.0)
Platelets: 321 10*3/uL (ref 145–400)
RBC: 4.39 10*6/uL (ref 3.70–5.32)
RDW: 12.9 % (ref 11.1–15.7)
WBC: 7.6 10*3/uL (ref 3.9–10.0)

## 2011-02-02 LAB — COMPREHENSIVE METABOLIC PANEL
ALT: 33 U/L (ref 0–35)
AST: 32 U/L (ref 0–37)
Albumin: 4.2 g/dL (ref 3.5–5.2)
Alkaline Phosphatase: 82 U/L (ref 39–117)
BUN: 17 mg/dL (ref 6–23)
CO2: 26 mEq/L (ref 19–32)
Calcium: 9.6 mg/dL (ref 8.4–10.5)
Chloride: 103 mEq/L (ref 96–112)
Creatinine, Ser: 0.79 mg/dL (ref 0.50–1.10)
Glucose, Bld: 99 mg/dL (ref 70–99)
Potassium: 4 mEq/L (ref 3.5–5.3)
Sodium: 142 mEq/L (ref 135–145)
Total Bilirubin: 1.4 mg/dL — ABNORMAL HIGH (ref 0.3–1.2)
Total Protein: 6.8 g/dL (ref 6.0–8.3)

## 2011-02-02 LAB — PROTIME-INR (CHCC SATELLITE)
INR: 1.6 — ABNORMAL LOW (ref 2.0–3.5)
Protime: 19.2 Seconds — ABNORMAL HIGH (ref 10.6–13.4)

## 2011-02-10 ENCOUNTER — Ambulatory Visit (INDEPENDENT_AMBULATORY_CARE_PROVIDER_SITE_OTHER): Payer: Self-pay | Admitting: General Surgery

## 2011-02-13 ENCOUNTER — Encounter (INDEPENDENT_AMBULATORY_CARE_PROVIDER_SITE_OTHER): Payer: Self-pay

## 2011-02-13 ENCOUNTER — Other Ambulatory Visit: Payer: Self-pay | Admitting: Hematology & Oncology

## 2011-02-13 ENCOUNTER — Encounter (HOSPITAL_BASED_OUTPATIENT_CLINIC_OR_DEPARTMENT_OTHER): Payer: PRIVATE HEALTH INSURANCE | Admitting: Hematology & Oncology

## 2011-02-13 DIAGNOSIS — Z86718 Personal history of other venous thrombosis and embolism: Secondary | ICD-10-CM

## 2011-02-13 DIAGNOSIS — Z7901 Long term (current) use of anticoagulants: Secondary | ICD-10-CM

## 2011-02-13 DIAGNOSIS — M549 Dorsalgia, unspecified: Secondary | ICD-10-CM

## 2011-02-13 DIAGNOSIS — D6859 Other primary thrombophilia: Secondary | ICD-10-CM

## 2011-02-13 LAB — PROTIME-INR (CHCC SATELLITE)
INR: 3 (ref 2.0–3.5)
Protime: 36 Seconds — ABNORMAL HIGH (ref 10.6–13.4)

## 2011-02-14 ENCOUNTER — Encounter (INDEPENDENT_AMBULATORY_CARE_PROVIDER_SITE_OTHER): Payer: Self-pay | Admitting: General Surgery

## 2011-02-14 ENCOUNTER — Ambulatory Visit (INDEPENDENT_AMBULATORY_CARE_PROVIDER_SITE_OTHER): Payer: PRIVATE HEALTH INSURANCE | Admitting: General Surgery

## 2011-02-14 DIAGNOSIS — Z9889 Other specified postprocedural states: Secondary | ICD-10-CM

## 2011-02-14 DIAGNOSIS — Z8719 Personal history of other diseases of the digestive system: Secondary | ICD-10-CM

## 2011-02-14 DIAGNOSIS — K432 Incisional hernia without obstruction or gangrene: Secondary | ICD-10-CM | POA: Insufficient documentation

## 2011-02-14 DIAGNOSIS — R1012 Left upper quadrant pain: Secondary | ICD-10-CM

## 2011-02-14 NOTE — Patient Instructions (Signed)
Avoid lifting over 10 pounds.

## 2011-02-14 NOTE — Progress Notes (Signed)
Chief Complaint  Patient presents with  . Other    anticardiolipid syndrome    HPI Laurie Green is a 65 y.o. female.   HPI  She is referred by Dr. Marin Olp for evaluation of a painful incisional hernia.  She had an episode of severe pain and swelling in her left subcostal scar area (from a splenectomy) recently which has improved.  She had a previous incisional hernia at that site that was repaired by Dr. Rise Patience many years ago.  She has a CT scan from 07/2009 that demonstrates a left flank incisional hernia containing fatty tissue only.  No sxs of obstruction.  Past Medical History  Diagnosis Date  . TIA (transient ischemic attack)   . Hypertension   . Anti-cardiolipin antibody syndrome     antiphospholipid antibody syndrome  . Diverticulitis   . Portal hypertension   . Varices, gastric   . Varices, esophageal   . Fibromyalgia   . Celiac disease   . Diabetes mellitus     Past Surgical History  Procedure Date  . Colon surgery   . Abdominal hysterectomy   . Splenectomy, total   . Cholecystectomy   . Hernia repair     luq incisional    History reviewed. No pertinent family history.  Social History History  Substance Use Topics  . Smoking status: Never Smoker   . Smokeless tobacco: Not on file  . Alcohol Use: No    Allergies  Allergen Reactions  . Caine-1 (Lidocaine Hcl) Anaphylaxis  . Codeine Nausea And Vomiting  . Dilaudid (Hydromorphone Hcl) Nausea And Vomiting  . Morphine And Related Nausea And Vomiting  . Percocet (Oxycodone-Acetaminophen) Nausea And Vomiting  . Tramadol Nausea And Vomiting  . Vicodin (Hydrocodone-Acetaminophen) Nausea And Vomiting    Current Outpatient Prescriptions  Medication Sig Dispense Refill  . b complex vitamins tablet Take 1 tablet by mouth daily.        . benazepril (LOTENSIN) 20 MG tablet Take 20 mg by mouth daily.        Marland Kitchen DIGESTIVE ENZYMES PO Take 1 capsule by mouth daily.        . Ergocalciferol (VITAMIN D2) 2000 UNITS  TABS Take 1 tablet by mouth daily.        Marland Kitchen omeprazole (PRILOSEC OTC) 20 MG tablet Take 20 mg by mouth daily.        . propranolol (INDERAL) 80 MG tablet Take 80 mg by mouth daily.        Marland Kitchen warfarin (COUMADIN) 7.5 MG tablet Take 7.5 mg by mouth daily.          Review of Systems Review of Systems  Constitutional: Negative.   Respiratory: Negative for cough, shortness of breath and wheezing.   Cardiovascular: Negative for chest pain.       Murmur.  Gastrointestinal: Negative for abdominal distention.  Genitourinary: Positive for flank pain (in the lateral aspect of the scar). Negative for difficulty urinating.  Musculoskeletal: Positive for arthralgias.  Hematological:       On chronic Coumadin.    Blood pressure 142/96, pulse 45, temperature 96.9 F (36.1 C), temperature source Temporal, resp. rate 16, height 5' 3"  (1.6 m), weight 152 lb (68.947 kg).  Physical Exam Physical Exam  Constitutional: No distress.       Overweight.  Cardiovascular: Normal rate and regular rhythm.   Pulmonary/Chest: Effort normal and breath sounds normal.  Abdominal: Soft. She exhibits no distension. There is no tenderness.       Bilateral  subcostal scars. Midline scar.  Reducible bulge in lateral aspect of luq scar.  Skin: Skin is warm and dry.  Psychiatric: She has a normal mood and affect. Her behavior is normal.    Data Reviewed 07/2009 CT scan.  Dr. Antonieta Pert notes. Assessment    Symptomatic, recurrent left subcostal incisional hernia- easily reducible; has multiple significant comorbidities increasing her operative risk.    Plan    Repeat CT scan to check contents of hernia.  Return visit after CT to discuss results and treatment options.       Jeromiah Ohalloran J 02/14/2011, 1:04 PM

## 2011-02-16 ENCOUNTER — Ambulatory Visit
Admission: RE | Admit: 2011-02-16 | Discharge: 2011-02-16 | Disposition: A | Discharge status: Home / Self Care | Payer: PRIVATE HEALTH INSURANCE | Source: Ambulatory Visit | Attending: General Surgery | Admitting: General Surgery

## 2011-02-16 DIAGNOSIS — R1012 Left upper quadrant pain: Secondary | ICD-10-CM

## 2011-02-16 DIAGNOSIS — Z8719 Personal history of other diseases of the digestive system: Secondary | ICD-10-CM

## 2011-02-16 MED ORDER — IOHEXOL 300 MG/ML  SOLN
100.0000 mL | Freq: Once | INTRAMUSCULAR | Status: AC | PRN
  Administered 2011-02-16: 100 mL via INTRAVENOUS

## 2011-02-17 ENCOUNTER — Telehealth (INDEPENDENT_AMBULATORY_CARE_PROVIDER_SITE_OTHER): Payer: Self-pay | Admitting: General Surgery

## 2011-02-17 NOTE — Telephone Encounter (Signed)
CT scan demonstrates a stable left incision hernia containing fatty tissue which is unchanged since 07/2009.  It also demonstrates extensive, large esophageal varices and findings consistent with cirrhosis.  The portal vein has cavernous changes with collaterals around it.  I feel the risk of incisional hernia repair outweighs the benefits in her.  I have discussed this with her and Dr. Marin Olp.  They understand.  I told her she may want to contact Dr. Alycia Rossetti regarding her large esophageal varices.

## 2011-02-27 ENCOUNTER — Other Ambulatory Visit: Payer: Self-pay | Admitting: *Deleted

## 2011-02-27 ENCOUNTER — Ambulatory Visit (HOSPITAL_BASED_OUTPATIENT_CLINIC_OR_DEPARTMENT_OTHER): Payer: PRIVATE HEALTH INSURANCE | Admitting: Lab

## 2011-02-27 DIAGNOSIS — D6859 Other primary thrombophilia: Secondary | ICD-10-CM

## 2011-02-27 DIAGNOSIS — D6861 Antiphospholipid syndrome: Secondary | ICD-10-CM

## 2011-02-27 DIAGNOSIS — M549 Dorsalgia, unspecified: Secondary | ICD-10-CM

## 2011-02-27 DIAGNOSIS — Z7901 Long term (current) use of anticoagulants: Secondary | ICD-10-CM

## 2011-02-27 DIAGNOSIS — Z86718 Personal history of other venous thrombosis and embolism: Secondary | ICD-10-CM

## 2011-02-27 LAB — PROTIME-INR (CHCC SATELLITE)
INR: 3.8 — ABNORMAL HIGH (ref 2.0–3.5)
Protime: 45.6 Seconds — ABNORMAL HIGH (ref 10.6–13.4)

## 2011-02-28 ENCOUNTER — Encounter: Payer: Self-pay | Admitting: Hematology & Oncology

## 2011-02-28 ENCOUNTER — Telehealth: Payer: Self-pay | Admitting: Hematology & Oncology

## 2011-02-28 NOTE — Telephone Encounter (Signed)
This encounter was created in error - please disregard.

## 2011-02-28 NOTE — Telephone Encounter (Signed)
Faxed lab results from last three visits to [facility] at Dr. [name]' office - fax [phone] - for continuity of care.

## 2011-03-06 ENCOUNTER — Other Ambulatory Visit: Payer: PRIVATE HEALTH INSURANCE | Admitting: Lab

## 2011-03-07 ENCOUNTER — Encounter (INDEPENDENT_AMBULATORY_CARE_PROVIDER_SITE_OTHER): Payer: PRIVATE HEALTH INSURANCE | Admitting: General Surgery

## 2011-03-08 ENCOUNTER — Other Ambulatory Visit: Payer: Self-pay | Admitting: Family

## 2011-03-08 ENCOUNTER — Other Ambulatory Visit (HOSPITAL_BASED_OUTPATIENT_CLINIC_OR_DEPARTMENT_OTHER): Payer: PRIVATE HEALTH INSURANCE | Admitting: Lab

## 2011-03-08 DIAGNOSIS — Z86718 Personal history of other venous thrombosis and embolism: Secondary | ICD-10-CM

## 2011-03-08 DIAGNOSIS — D6859 Other primary thrombophilia: Secondary | ICD-10-CM

## 2011-03-08 DIAGNOSIS — Z7901 Long term (current) use of anticoagulants: Secondary | ICD-10-CM

## 2011-03-08 DIAGNOSIS — M549 Dorsalgia, unspecified: Secondary | ICD-10-CM

## 2011-03-08 LAB — PROTIME-INR (CHCC SATELLITE)
INR: 1.5 — ABNORMAL LOW (ref 2.0–3.5)
Protime: 18 Seconds — ABNORMAL HIGH (ref 10.6–13.4)

## 2011-03-15 ENCOUNTER — Ambulatory Visit (HOSPITAL_BASED_OUTPATIENT_CLINIC_OR_DEPARTMENT_OTHER): Payer: PRIVATE HEALTH INSURANCE | Admitting: Hematology & Oncology

## 2011-03-15 ENCOUNTER — Other Ambulatory Visit: Payer: Self-pay | Admitting: Hematology & Oncology

## 2011-03-15 ENCOUNTER — Other Ambulatory Visit (HOSPITAL_BASED_OUTPATIENT_CLINIC_OR_DEPARTMENT_OTHER): Payer: PRIVATE HEALTH INSURANCE | Admitting: Lab

## 2011-03-15 VITALS — BP 112/68 | HR 49 | Temp 98.1°F | Ht 63.0 in | Wt 151.0 lb

## 2011-03-15 DIAGNOSIS — O2230 Deep phlebothrombosis in pregnancy, unspecified trimester: Secondary | ICD-10-CM

## 2011-03-15 DIAGNOSIS — Z7901 Long term (current) use of anticoagulants: Secondary | ICD-10-CM

## 2011-03-15 DIAGNOSIS — M549 Dorsalgia, unspecified: Secondary | ICD-10-CM

## 2011-03-15 DIAGNOSIS — Z86718 Personal history of other venous thrombosis and embolism: Secondary | ICD-10-CM

## 2011-03-15 DIAGNOSIS — D6859 Other primary thrombophilia: Secondary | ICD-10-CM

## 2011-03-15 DIAGNOSIS — R894 Abnormal immunological findings in specimens from other organs, systems and tissues: Secondary | ICD-10-CM

## 2011-03-15 LAB — CBC WITH DIFFERENTIAL (CANCER CENTER ONLY)
BASO#: 0 10*3/uL (ref 0.0–0.2)
BASO%: 0.6 % (ref 0.0–2.0)
EOS%: 2 % (ref 0.0–7.0)
Eosinophils Absolute: 0.1 10*3/uL (ref 0.0–0.5)
HCT: 41.2 % (ref 34.8–46.6)
HGB: 14.8 g/dL (ref 11.6–15.9)
LYMPH#: 2.3 10*3/uL (ref 0.9–3.3)
LYMPH%: 32.4 % (ref 14.0–48.0)
MCH: 33.9 pg (ref 26.0–34.0)
MCHC: 35.9 g/dL (ref 32.0–36.0)
MCV: 95 fL (ref 81–101)
MONO#: 0.8 10*3/uL (ref 0.1–0.9)
MONO%: 11.6 % (ref 0.0–13.0)
NEUT#: 3.7 10*3/uL (ref 1.5–6.5)
NEUT%: 53.4 % (ref 39.6–80.0)
Platelets: 402 10*3/uL — ABNORMAL HIGH (ref 145–400)
RBC: 4.36 10*6/uL (ref 3.70–5.32)
RDW: 13.1 % (ref 11.1–15.7)
WBC: 7 10*3/uL (ref 3.9–10.0)

## 2011-03-15 LAB — COMPREHENSIVE METABOLIC PANEL
ALT: 39 U/L — ABNORMAL HIGH (ref 0–35)
AST: 32 U/L (ref 0–37)
Albumin: 4.1 g/dL (ref 3.5–5.2)
Alkaline Phosphatase: 117 U/L (ref 39–117)
BUN: 16 mg/dL (ref 6–23)
CO2: 27 mEq/L (ref 19–32)
Calcium: 9.8 mg/dL (ref 8.4–10.5)
Chloride: 104 mEq/L (ref 96–112)
Creatinine, Ser: 0.8 mg/dL (ref 0.50–1.10)
Glucose, Bld: 105 mg/dL — ABNORMAL HIGH (ref 70–99)
Potassium: 4.2 mEq/L (ref 3.5–5.3)
Sodium: 143 mEq/L (ref 135–145)
Total Bilirubin: 1.3 mg/dL — ABNORMAL HIGH (ref 0.3–1.2)
Total Protein: 6.6 g/dL (ref 6.0–8.3)

## 2011-03-15 LAB — PROTIME-INR (CHCC SATELLITE)
INR: 1.9 — ABNORMAL LOW (ref 2.0–3.5)
Protime: 22.8 Seconds — ABNORMAL HIGH (ref 10.6–13.4)

## 2011-03-15 NOTE — Progress Notes (Signed)
Addended by: Burney Gauze R on: 03/15/2011 05:28 PM   Modules accepted: Orders

## 2011-03-15 NOTE — Progress Notes (Signed)
CC:   Frann Rider, M.D. James L. Rolla Flatten., M.D. Odis Hollingshead, M.D.  DIAGNOSES: 1. Antiphospholipid antibody syndrome with a history of deep venous     thromboses in the legs. 2. Fibromyalgia. 3. Celiac disease. 4. Nonalcoholic steatohepatitis with significant varices.  CURRENT THERAPY:  Coumadin 7.5 mg p.o. q. day.  INTERIM HISTORY:  Ms. Jane comes in for followup.  She has been seen by Dr. Zella Richer over at [facility].  There was some question about her having a hernia.  Dr. Zella Richer did a CT scan.  The CT scan showed she had significant varices.  We have known that she has had varices in the past.  She is going to be seen by Dr. Oletta Lamas on the 14th of December.  He is going to do an endoscopy on her.  He will see if the varices have changed.  To date, she has never had any problems of bleeding with the varices. She has been on Coumadin because of the antiphospholipid antibody syndrome.  Of note, she does have a filter in.  That being the case, we might have the luxury of getting her off Coumadin to minimize her risk of variceal bleeding.  She has had numerous Dopplers done on her legs.  We have never documented any DVT for over 10 years.  She still has chronic abdominal pain.  She is very anxious and nervous over the varices.  She also has fibromyalgia.  She is managing this fairly well.  She does have diabetes.  She says that when her blood gets "thick," her blood sugars go up.  I told her I was not aware of any relationship between the two.  PHYSICAL EXAM:  General:  This is a fairly well-developed, well- nourished white female in no obvious distress.  Vital Signs: Temperature of 98.1, pulse 49, respiratory rate 12, blood pressure 112/68.  Weight is 151.  Head/Neck:  Exam shows a normocephalic, atraumatic skull.  There are no ocular or oral lesions.  There are no palpable cervical or supraclavicular lymph nodes.  Lungs:  Clear to percussion  and auscultation bilaterally.  Cardiac:  Regular rate and rhythm with a normal S1, S2.  There are no murmurs, rubs or bruits. Abdomen:  Soft with good bowel sounds.  She has laparotomy scars that are well healed.  She does not have a spleen.  She has no palpable liver edge.  Extremities:  No clubbing, cyanosis or edema.  No palpable venous cord is noted in her legs.  Skin:  No rashes, ecchymoses or petechiae. Neurologic:  No focal neurological deficits.  LABORATORY STUDIES:  White cell count 7, hemoglobin 14, hematocrit 40, platelet count 402.  INR is 1.9.  IMPRESSION/PLAN:  Ms. Tucci is a 65 year old female with antiphospholipid antibody syndrome.  Again, she has been on lifelong anticoagulation.  However, I think these varices might become more of an issue.  I really would not want to see her bleed from one of these varices as that could be fatal.  She does have the filter in.  This should be protective in over 90% of situations with respect to preventing a pulmonary embolus.  I think we can probably get her off Coumadin.  She is very nervous about being off Coumadin.  This is going to be a "balancing act."  I told her that sometimes we have people on low-dose Coumadin (2 mg p.o. daily) which does keep her blood a little on the thin side but not thin  enough that it should increase bleeding risk.  She will stop her Coumadin about a week before her upper endoscopy.  I told her that after she has the endoscopy, she can go back on 2 mg a day.  I am going to check her cardiolipin levels.  I have not checked these in probably 8 years or more.  It would be interesting to see if she still carries these elevated antibody titers.  I think the biggest thing we have to do is control Ms. Heidrick's anxiety over her situation.  She herself is trying to remain calm.  TIME SPENT:  I spent over 35-40 minutes with her today.  FOLLOWUP:  I want see Ms. Dannenberg back in about a  month.    ______________________________ [name], M.D. PRE/MEDQ  D:  03/15/2011  T:  03/15/2011  Job:  [id number]

## 2011-03-15 NOTE — Progress Notes (Signed)
This office note has been dictated.

## 2011-03-23 ENCOUNTER — Encounter (INDEPENDENT_AMBULATORY_CARE_PROVIDER_SITE_OTHER): Payer: Self-pay

## 2011-03-31 ENCOUNTER — Encounter (HOSPITAL_COMMUNITY): Admission: RE | Payer: Self-pay | Source: Ambulatory Visit

## 2011-03-31 ENCOUNTER — Ambulatory Visit (HOSPITAL_COMMUNITY)
Admission: RE | Admit: 2011-03-31 | Payer: PRIVATE HEALTH INSURANCE | Source: Ambulatory Visit | Admitting: Gastroenterology

## 2011-03-31 SURGERY — EGD (ESOPHAGOGASTRODUODENOSCOPY)
Anesthesia: Moderate Sedation

## 2011-04-06 ENCOUNTER — Other Ambulatory Visit: Payer: PRIVATE HEALTH INSURANCE | Admitting: Lab

## 2011-04-06 ENCOUNTER — Ambulatory Visit: Payer: PRIVATE HEALTH INSURANCE | Admitting: Hematology & Oncology

## 2011-04-28 ENCOUNTER — Encounter (HOSPITAL_COMMUNITY)
Admission: RE | Disposition: A | Discharge status: Home / Self Care | Payer: Self-pay | Source: Ambulatory Visit | Attending: Gastroenterology

## 2011-04-28 ENCOUNTER — Encounter (HOSPITAL_COMMUNITY): Payer: Self-pay | Admitting: *Deleted

## 2011-04-28 ENCOUNTER — Ambulatory Visit (HOSPITAL_COMMUNITY)
Admission: RE | Admit: 2011-04-28 | Discharge: 2011-04-28 | Disposition: A | Discharge status: Home / Self Care | Payer: PRIVATE HEALTH INSURANCE | Source: Ambulatory Visit | Attending: Gastroenterology | Admitting: Gastroenterology

## 2011-04-28 DIAGNOSIS — I8500 Esophageal varices without bleeding: Secondary | ICD-10-CM | POA: Insufficient documentation

## 2011-04-28 HISTORY — DX: Gastro-esophageal reflux disease without esophagitis: K21.9

## 2011-04-28 HISTORY — DX: Cardiac murmur, unspecified: R01.1

## 2011-04-28 HISTORY — DX: Other specified postprocedural states: R11.2

## 2011-04-28 HISTORY — DX: Other specified postprocedural states: Z98.890

## 2011-04-28 HISTORY — DX: Cerebral infarction, unspecified: I63.9

## 2011-04-28 HISTORY — DX: Disease of blood and blood-forming organs, unspecified: D75.9

## 2011-04-28 HISTORY — DX: Reserved for inherently not codable concepts without codable children: IMO0001

## 2011-04-28 HISTORY — DX: Encounter for other specified aftercare: Z51.89

## 2011-04-28 HISTORY — PX: ESOPHAGOGASTRODUODENOSCOPY: SHX5428

## 2011-04-28 HISTORY — DX: Unspecified osteoarthritis, unspecified site: M19.90

## 2011-04-28 LAB — GLUCOSE, CAPILLARY: Glucose-Capillary: 138 mg/dL — ABNORMAL HIGH (ref 70–99)

## 2011-04-28 SURGERY — EGD (ESOPHAGOGASTRODUODENOSCOPY)
Anesthesia: Moderate Sedation

## 2011-04-28 MED ORDER — MIDAZOLAM HCL 10 MG/2ML IJ SOLN
INTRAMUSCULAR | Status: DC | PRN
  Administered 2011-04-28 (×2): 2 mg via INTRAVENOUS
  Administered 2011-04-28: 1 mg via INTRAVENOUS
  Administered 2011-04-28: 2 mg via INTRAVENOUS

## 2011-04-28 MED ORDER — DIPHENHYDRAMINE HCL 50 MG/ML IJ SOLN
INTRAMUSCULAR | Status: AC
  Filled 2011-04-28: qty 1

## 2011-04-28 MED ORDER — DIPHENHYDRAMINE HCL 50 MG/ML IJ SOLN
INTRAMUSCULAR | Status: DC | PRN
  Administered 2011-04-28: 25 mg via INTRAVENOUS

## 2011-04-28 MED ORDER — FENTANYL CITRATE 0.05 MG/ML IJ SOLN
INTRAMUSCULAR | Status: AC
  Filled 2011-04-28: qty 4

## 2011-04-28 MED ORDER — FENTANYL NICU IV SYRINGE 50 MCG/ML
INJECTION | INTRAMUSCULAR | Status: DC | PRN
  Administered 2011-04-28: 12.5 ug via INTRAVENOUS
  Administered 2011-04-28 (×2): 25 ug via INTRAVENOUS

## 2011-04-28 MED ORDER — MIDAZOLAM HCL 10 MG/2ML IJ SOLN
INTRAMUSCULAR | Status: AC
  Filled 2011-04-28: qty 4

## 2011-04-28 MED ORDER — SODIUM CHLORIDE 0.9 % IV SOLN
Freq: Once | INTRAVENOUS | Status: AC
  Administered 2011-04-28: 500 mL via INTRAVENOUS

## 2011-04-28 NOTE — Op Note (Signed)
[facility] [place], [place]  [id number]  ENDOSCOPY PROCEDURE REPORT  PATIENT:  Laurie Green, Laurie Green  MR#:  [id number] BIRTHDATE:  09/20/1945, 65 yrs. old  GENDER:  female  ENDOSCOPIST:  [name] Referred by:  Burney Gauze, M.D. [name], M.D.  PROCEDURE DATE:  04/28/2011 PROCEDURE:  EGD, diagnostic 43235 ASA CLASS:  Class III INDICATIONS:  patient has a history of esophageal varices. Due to coagulopathy and portal vein thrombosis. She has cavernous transformation of the portal vein and imaging of the liver his suggested possible cirrhosis and shown clear gastric and esophageal varices. She has had common duct stone and ERCP with removal of common duct stone. The concern is the varices in the issue of whether or not she has liver disease. She is seen Dr. [name] over [facility]. We are doing an EGD to evaluate for any progression of her varices.  MEDICATIONS:   Benadryl 25 mg IV, Fentanyl 62.5 mcg IV, Versed 7 mg IV TOPICAL ANESTHETIC:  Cetacaine Spray  DESCRIPTION OF PROCEDURE:   After the risks and benefits of the procedure were explained, informed consent was obtained.  The EG-[id number] ([id number]) endoscope was introduced through the mouth and advanced to the second portion of the duodenum.  The instrument was slowly withdrawn as the mucosa was fully examined. <<PROCEDUREIMAGES>>  Grade I varices were found in the distal esophagus. these varices were quite minimal and if anything did not appear any larger than on EGD 2-3 years ago.  Mild gastritis was found antrum  the duodenal bulb and second duodenum were without abnormalities. Retroflexed views revealed no abnormalities.    The scope was then withdrawn from the patient and the procedure completed.  COMPLICATIONS:  A complication of none occurred on 04/28/2011 at.  ENDOSCOPIC IMPRESSION: 1) Grade I varices in the distal esophagus 2) Mild gastritis in the antrum 3) Nl duodenum I  don't feel that there is any sign that there is any progression of her esophageal varices RECOMMENDATIONS: we will continue all of her current medications and have her followup in the office with me in 3 months. She will resume her Coumadin today.  ______________________________ [name]  CC:  Burney Gauze, MD, [name], MD, [name],Paul MD  n. Lorrin MaisLaurence Spates at 04/28/2011 10:09 AM  Eugenie Filler, [phone]

## 2011-04-28 NOTE — H&P (Signed)
  H+P from office reviewed (scanned) Pt examined Chart reviewed Risks and benefits have been explained to pt.

## 2011-05-01 ENCOUNTER — Telehealth: Payer: Self-pay | Admitting: Hematology & Oncology

## 2011-05-01 ENCOUNTER — Other Ambulatory Visit: Payer: Self-pay | Admitting: Hematology & Oncology

## 2011-05-01 ENCOUNTER — Encounter (HOSPITAL_COMMUNITY): Payer: Self-pay | Admitting: Gastroenterology

## 2011-05-01 DIAGNOSIS — I82409 Acute embolism and thrombosis of unspecified deep veins of unspecified lower extremity: Secondary | ICD-10-CM

## 2011-05-01 DIAGNOSIS — K7581 Nonalcoholic steatohepatitis (NASH): Secondary | ICD-10-CM

## 2011-05-01 DIAGNOSIS — I8390 Asymptomatic varicose veins of unspecified lower extremity: Secondary | ICD-10-CM

## 2011-05-01 HISTORY — DX: Nonalcoholic steatohepatitis (NASH): K75.81

## 2011-05-01 HISTORY — DX: Asymptomatic varicose veins of unspecified lower extremity: I83.90

## 2011-05-01 HISTORY — DX: Acute embolism and thrombosis of unspecified deep veins of unspecified lower extremity: I82.409

## 2011-05-01 NOTE — Telephone Encounter (Signed)
Left pt message with 05-18-11 appointment

## 2011-05-18 ENCOUNTER — Other Ambulatory Visit (HOSPITAL_BASED_OUTPATIENT_CLINIC_OR_DEPARTMENT_OTHER): Payer: PRIVATE HEALTH INSURANCE | Admitting: Lab

## 2011-05-18 ENCOUNTER — Ambulatory Visit (HOSPITAL_BASED_OUTPATIENT_CLINIC_OR_DEPARTMENT_OTHER): Payer: PRIVATE HEALTH INSURANCE | Admitting: Hematology & Oncology

## 2011-05-18 VITALS — BP 126/80 | HR 57 | Temp 97.1°F | Ht 63.0 in | Wt 152.0 lb

## 2011-05-18 DIAGNOSIS — R1011 Right upper quadrant pain: Secondary | ICD-10-CM

## 2011-05-18 DIAGNOSIS — I82409 Acute embolism and thrombosis of unspecified deep veins of unspecified lower extremity: Secondary | ICD-10-CM

## 2011-05-18 DIAGNOSIS — D6859 Other primary thrombophilia: Secondary | ICD-10-CM

## 2011-05-18 DIAGNOSIS — K7581 Nonalcoholic steatohepatitis (NASH): Secondary | ICD-10-CM

## 2011-05-18 LAB — CBC WITH DIFFERENTIAL (CANCER CENTER ONLY)
BASO#: 0.1 10*3/uL (ref 0.0–0.2)
BASO%: 0.8 % (ref 0.0–2.0)
EOS%: 3.2 % (ref 0.0–7.0)
Eosinophils Absolute: 0.2 10*3/uL (ref 0.0–0.5)
HCT: 43.1 % (ref 34.8–46.6)
HGB: 15.5 g/dL (ref 11.6–15.9)
LYMPH#: 2.5 10*3/uL (ref 0.9–3.3)
LYMPH%: 39.4 % (ref 14.0–48.0)
MCH: 33.8 pg (ref 26.0–34.0)
MCHC: 36 g/dL (ref 32.0–36.0)
MCV: 94 fL (ref 81–101)
MONO#: 0.7 10*3/uL (ref 0.1–0.9)
MONO%: 11 % (ref 0.0–13.0)
NEUT#: 2.9 10*3/uL (ref 1.5–6.5)
NEUT%: 45.6 % (ref 39.6–80.0)
Platelets: 326 10*3/uL (ref 145–400)
RBC: 4.59 10*6/uL (ref 3.70–5.32)
RDW: 12.7 % (ref 11.1–15.7)
WBC: 6.3 10*3/uL (ref 3.9–10.0)

## 2011-05-18 LAB — COMPREHENSIVE METABOLIC PANEL
ALT: 24 U/L (ref 0–35)
AST: 30 U/L (ref 0–37)
Albumin: 4.3 g/dL (ref 3.5–5.2)
Alkaline Phosphatase: 77 U/L (ref 39–117)
BUN: 18 mg/dL (ref 6–23)
CO2: 27 mEq/L (ref 19–32)
Calcium: 9.4 mg/dL (ref 8.4–10.5)
Chloride: 102 mEq/L (ref 96–112)
Creatinine, Ser: 0.79 mg/dL (ref 0.50–1.10)
Glucose, Bld: 120 mg/dL — ABNORMAL HIGH (ref 70–99)
Potassium: 4 mEq/L (ref 3.5–5.3)
Sodium: 140 mEq/L (ref 135–145)
Total Bilirubin: 1.5 mg/dL — ABNORMAL HIGH (ref 0.3–1.2)
Total Protein: 6.9 g/dL (ref 6.0–8.3)

## 2011-05-18 LAB — PROTIME-INR (CHCC SATELLITE)
INR: 1.8 — ABNORMAL LOW (ref 2.0–3.5)
Protime: 21.6 Seconds — ABNORMAL HIGH (ref 10.6–13.4)

## 2011-05-18 MED ORDER — MAGIC MOUTHWASH: 15.0000 mL | Freq: Three times a day (TID) | ORAL | Status: DC

## 2011-05-18 NOTE — Progress Notes (Signed)
This office note has been dictated.

## 2011-05-18 NOTE — Progress Notes (Signed)
Addended by: Jodelle Green on: 05/18/2011 04:09 PM   Modules accepted: Medications

## 2011-05-19 NOTE — Progress Notes (Signed)
CC:   Frann Rider, M.D. James L. Rolla Flatten., M.D. Odis Hollingshead, M.D.  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 2. Celiac disease. 3. NASH with varices. 4. Fibromyalgia.  CURRENT THERAPY:  Coumadin 7 5 mg p.o. daily.  INTERIM HISTORY:  Laurie Green comes in for follow-up.  She is actually doing quite well.  I think she was seen by Dr. Leonie Douglas recently of GI.  He went ahead and did another endoscopy on her.  He said that the varices did not grow any.  This encouraged Laurie Green quite a bit.  She has not had a DVT in her legs now for over 10 years.  I talked to her about going off of Coumadin or at least going on a low dose of Coumadin.  She just would get too anxious not being on Coumadin.  As such, she is still on Coumadin 7.5 mg a day.  She still has occasional pain in the right upper quadrant.  I think this is a chronic issue.  She has had no bleeding.  She has had no cough.  She has had no fevers, sweats or chills.  She has not noticed any leg swelling.  PHYSICAL EXAMINATION:  General Appearance:  This is a well-developed, well-nourished white female in no obvious distress.  Vital Signs:  97.1, pulse 57, respiratory rate 18, blood pressure 126/80.  Weight is 152. Head and Neck Exam:  Shows a normocephalic, atraumatic skull.  There are no ocular or oral lesions.  There are no palpable cervical or supraclavicular lymph nodes.  Lungs:  Clear bilaterally.  Cardiac Exam: Regular rate and rhythm with a normal S1 and S2.  There are no murmurs, rubs or bruits.  Abdominal Exam:  Soft with good bowel sounds.  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  She has no fluid wave.  There is no palpable hepatomegaly.  There may be some slight tenderness in the right upper quadrant to palpation.  Extremities:  Show no clubbing, cyanosis or edema.  She has good range of motion of her joints.  Neurological  Exam:  Shows no focal neurological deficits.  LABORATORY STUDIES:  White cell count is 6.3, hemoglobin 15.5, hematocrit 43.1, platelet count 386.  INR is 1.8.  IMPRESSION:  Laurie Green is a 66 year old white female with APA syndrome.  She has done well with this.  Again, she has not had any deep venous thromboses for over 10 years.  I talked to her again about getting off the Coumadin.  She just would get too nervous and worried about blood clots all of the time.  She just could not mentally handle being off anticoagulation.  For now, I am not going to adjust her Coumadin dose.  I think if her INR is between 1.5 to 2 this would be okay for me.  I do not see any need for additional testing right now.  We will plan to get her back in another couple of months to see me.  I will have her come back in 1 month to have a repeat of her prothrombin time/INR.    ______________________________ [name], M.D. PRE/MEDQ  D:  05/18/2011  T:  05/19/2011  Job:  [id number]

## 2011-05-26 ENCOUNTER — Other Ambulatory Visit: Payer: Self-pay | Admitting: Hematology & Oncology

## 2011-06-15 ENCOUNTER — Other Ambulatory Visit: Payer: PRIVATE HEALTH INSURANCE | Admitting: Lab

## 2011-06-15 ENCOUNTER — Telehealth: Payer: Self-pay | Admitting: Hematology & Oncology

## 2011-06-15 NOTE — Telephone Encounter (Signed)
Pt moved 2-28 to 3-1

## 2011-06-16 ENCOUNTER — Ambulatory Visit (HOSPITAL_BASED_OUTPATIENT_CLINIC_OR_DEPARTMENT_OTHER): Payer: PRIVATE HEALTH INSURANCE | Admitting: Lab

## 2011-06-16 DIAGNOSIS — Z7901 Long term (current) use of anticoagulants: Secondary | ICD-10-CM

## 2011-06-16 DIAGNOSIS — K7581 Nonalcoholic steatohepatitis (NASH): Secondary | ICD-10-CM

## 2011-06-16 DIAGNOSIS — Z86718 Personal history of other venous thrombosis and embolism: Secondary | ICD-10-CM

## 2011-06-16 LAB — POCT INR: INR: 2.5

## 2011-06-16 LAB — PROTIME-INR (CHCC SATELLITE)
INR: 2.5 (ref 2.0–3.5)
Protime: 30 Seconds — ABNORMAL HIGH (ref 10.6–13.4)

## 2011-06-16 NOTE — Patient Instructions (Signed)
Pt notified of INR results, no changes per Dr. Martha Clan.  Pt states is taking Coumadin 7.63m daily. Laurie Green [employer]

## 2011-06-19 ENCOUNTER — Telehealth: Payer: Self-pay | Admitting: *Deleted

## 2011-06-19 NOTE — Telephone Encounter (Signed)
Called patient to let her know there is no change in coumadin per dr. Marin Olp

## 2011-06-19 NOTE — Telephone Encounter (Signed)
Message copied by Rico Ala on Mon Jun 19, 2011 10:05 AM ------      Message from: [name]      Created: Sun Jun 18, 2011  8:22 PM       Call- no change in coumadin

## 2011-07-13 ENCOUNTER — Ambulatory Visit (HOSPITAL_BASED_OUTPATIENT_CLINIC_OR_DEPARTMENT_OTHER): Payer: PRIVATE HEALTH INSURANCE | Admitting: Hematology & Oncology

## 2011-07-13 ENCOUNTER — Other Ambulatory Visit (HOSPITAL_BASED_OUTPATIENT_CLINIC_OR_DEPARTMENT_OTHER): Payer: PRIVATE HEALTH INSURANCE | Admitting: Lab

## 2011-07-13 VITALS — BP 135/80 | HR 59 | Temp 97.0°F | Ht 63.0 in | Wt 155.0 lb

## 2011-07-13 DIAGNOSIS — Z7901 Long term (current) use of anticoagulants: Secondary | ICD-10-CM

## 2011-07-13 DIAGNOSIS — D6859 Other primary thrombophilia: Secondary | ICD-10-CM

## 2011-07-13 DIAGNOSIS — K7581 Nonalcoholic steatohepatitis (NASH): Secondary | ICD-10-CM

## 2011-07-13 DIAGNOSIS — I8390 Asymptomatic varicose veins of unspecified lower extremity: Secondary | ICD-10-CM

## 2011-07-13 DIAGNOSIS — Z86718 Personal history of other venous thrombosis and embolism: Secondary | ICD-10-CM

## 2011-07-13 DIAGNOSIS — I82409 Acute embolism and thrombosis of unspecified deep veins of unspecified lower extremity: Secondary | ICD-10-CM

## 2011-07-13 DIAGNOSIS — J329 Chronic sinusitis, unspecified: Secondary | ICD-10-CM

## 2011-07-13 LAB — CBC WITH DIFFERENTIAL (CANCER CENTER ONLY)
BASO#: 0 10*3/uL (ref 0.0–0.2)
BASO%: 0.6 % (ref 0.0–2.0)
EOS%: 3.2 % (ref 0.0–7.0)
Eosinophils Absolute: 0.2 10*3/uL (ref 0.0–0.5)
HCT: 42.1 % (ref 34.8–46.6)
HGB: 14.8 g/dL (ref 11.6–15.9)
LYMPH#: 2.4 10*3/uL (ref 0.9–3.3)
LYMPH%: 33.8 % (ref 14.0–48.0)
MCH: 33.2 pg (ref 26.0–34.0)
MCHC: 35.2 g/dL (ref 32.0–36.0)
MCV: 94 fL (ref 81–101)
MONO#: 0.9 10*3/uL (ref 0.1–0.9)
MONO%: 12.1 % (ref 0.0–13.0)
NEUT#: 3.6 10*3/uL (ref 1.5–6.5)
NEUT%: 50.3 % (ref 39.6–80.0)
Platelets: 326 10*3/uL (ref 145–400)
RBC: 4.46 10*6/uL (ref 3.70–5.32)
RDW: 12.6 % (ref 11.1–15.7)
WBC: 7.2 10*3/uL (ref 3.9–10.0)

## 2011-07-13 LAB — COMPREHENSIVE METABOLIC PANEL
ALT: 28 U/L (ref 0–35)
AST: 28 U/L (ref 0–37)
Albumin: 4.1 g/dL (ref 3.5–5.2)
Alkaline Phosphatase: 96 U/L (ref 39–117)
BUN: 17 mg/dL (ref 6–23)
CO2: 31 mEq/L (ref 19–32)
Calcium: 9.4 mg/dL (ref 8.4–10.5)
Chloride: 101 mEq/L (ref 96–112)
Creatinine, Ser: 0.8 mg/dL (ref 0.50–1.10)
Glucose, Bld: 189 mg/dL — ABNORMAL HIGH (ref 70–99)
Potassium: 4.1 mEq/L (ref 3.5–5.3)
Sodium: 140 mEq/L (ref 135–145)
Total Bilirubin: 1.2 mg/dL (ref 0.3–1.2)
Total Protein: 6.6 g/dL (ref 6.0–8.3)

## 2011-07-13 LAB — PROTIME-INR (CHCC SATELLITE)
INR: 2.3 (ref 2.0–3.5)
Protime: 27.6 Seconds — ABNORMAL HIGH (ref 10.6–13.4)

## 2011-07-13 MED ORDER — AZELASTINE HCL 0.1 % NA SOLN: 1.0000 | Freq: Two times a day (BID) | NASAL | Status: DC

## 2011-07-13 NOTE — Progress Notes (Signed)
This office note has been dictated.

## 2011-07-13 NOTE — Progress Notes (Signed)
CC:   Laurie Green, M.D. James L. Rolla Flatten., M.D.  DIAGNOSES: 1. 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 2. 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 3. Fibromyalgia. 4. Celiac disease.  CURRENT THERAPY:  Long-term anticoagulation with Coumadin (7.5 mg p.o. daily) to keep INR around 2.  INTERVAL HISTORY:  Laurie Green comes in for followup.  She is doing okay.  She is still having some bouts with abdominal pain.  She has had no bleeding.  She is thinking about some kind of surgical procedure for her varices.  I told her I really did not think that was a good way to go for her as she has never had problems with her varices, and she has not had any progression of her varices with her last endoscopy.  She is on a beta-blocker.  She says that her taste buds are decreasing.  I think this may be from the beta-blocker.  She has had no fever.  She has had no cough.  She still has sinus problems.  She saw Dr. Wilburn Cornelia because of a left ear issue.  I think he gave her some decongestant for this.  She says that it has not helped all that much.  I did go ahead and give her a prescription for Astelin nasal spray.  She has had no leg swelling.  She has had no joint issues.  Her fibromyalgia has not been too much of an issue for her right now.  PHYSICAL EXAM:  General: This is a well-developed, well-nourished, white female in no obvious distress.  Vital signs: Temperature 97, pulse 59, respiratory 18, blood pressure 135/80, and weight is 155.  Head and neck exam shows a normocephalic, atraumatic skull.  There are no ocular or oral lesions.  There are no palpable cervical or supraclavicular lymph nodes.  Lungs are clear bilaterally.  Cardiac exam: Regular rate and rhythm with a normal S1 and S2.  There are no murmurs, rubs, or bruits. Abdominal exam: Soft with good bowel sounds.  There is no  palpable abdominal mass.  There is no palpable hepatosplenomegaly.  Back exam: No tenderness over the spine, ribs, or hips.  Extremities: Shows no clubbing, cyanosis, or edema.  Neurological exam: Shows no focal neurological deficits.  LABORATORY STUDIES:  White cell count is 7.2, hemoglobin 14.8, hematocrit 42.1, and platelet count is 326.  IMPRESSION:  Laurie Green is a nice 66 year old white female with the antiphospholipid antibody syndrome.  She has had a history of recurrent deep venous thromboses.  She has not had any now for over 10 years.  She is doing well with Coumadin.  She has had no bleeding with Coumadin.  I really think that she should not get any surgical intervention for her varices.  Again, they are not growing.  She has had no problems with them.  We will go ahead and plan to keep checking her pro-time monthly.  I want to see her back myself in 2 months.    ______________________________ [name], M.D. PRE/MEDQ  D:  07/13/2011  T:  07/13/2011  Job:  [id number]

## 2011-08-02 ENCOUNTER — Other Ambulatory Visit (HOSPITAL_BASED_OUTPATIENT_CLINIC_OR_DEPARTMENT_OTHER): Payer: PRIVATE HEALTH INSURANCE | Admitting: Lab

## 2011-08-02 DIAGNOSIS — I82409 Acute embolism and thrombosis of unspecified deep veins of unspecified lower extremity: Secondary | ICD-10-CM

## 2011-08-02 DIAGNOSIS — K7581 Nonalcoholic steatohepatitis (NASH): Secondary | ICD-10-CM

## 2011-08-02 DIAGNOSIS — I8390 Asymptomatic varicose veins of unspecified lower extremity: Secondary | ICD-10-CM

## 2011-08-02 LAB — PROTIME-INR (CHCC SATELLITE)
INR: 2.2 (ref 2.0–3.5)
Protime: 26.4 Seconds — ABNORMAL HIGH (ref 10.6–13.4)

## 2011-08-30 ENCOUNTER — Ambulatory Visit (HOSPITAL_BASED_OUTPATIENT_CLINIC_OR_DEPARTMENT_OTHER): Payer: PRIVATE HEALTH INSURANCE | Admitting: Hematology & Oncology

## 2011-08-30 ENCOUNTER — Other Ambulatory Visit (HOSPITAL_BASED_OUTPATIENT_CLINIC_OR_DEPARTMENT_OTHER): Payer: PRIVATE HEALTH INSURANCE | Admitting: Lab

## 2011-08-30 VITALS — BP 121/66 | HR 63 | Temp 98.1°F | Ht 63.0 in | Wt 156.0 lb

## 2011-08-30 DIAGNOSIS — R300 Dysuria: Secondary | ICD-10-CM

## 2011-08-30 DIAGNOSIS — K7581 Nonalcoholic steatohepatitis (NASH): Secondary | ICD-10-CM

## 2011-08-30 DIAGNOSIS — Z7901 Long term (current) use of anticoagulants: Secondary | ICD-10-CM

## 2011-08-30 DIAGNOSIS — D6859 Other primary thrombophilia: Secondary | ICD-10-CM

## 2011-08-30 DIAGNOSIS — I8390 Asymptomatic varicose veins of unspecified lower extremity: Secondary | ICD-10-CM

## 2011-08-30 DIAGNOSIS — N390 Urinary tract infection, site not specified: Secondary | ICD-10-CM

## 2011-08-30 DIAGNOSIS — I82409 Acute embolism and thrombosis of unspecified deep veins of unspecified lower extremity: Secondary | ICD-10-CM

## 2011-08-30 LAB — LACTATE DEHYDROGENASE: LDH: 192 U/L (ref 94–250)

## 2011-08-30 LAB — CBC WITH DIFFERENTIAL (CANCER CENTER ONLY)
BASO#: 0 10*3/uL (ref 0.0–0.2)
BASO%: 0.6 % (ref 0.0–2.0)
EOS%: 2.9 % (ref 0.0–7.0)
Eosinophils Absolute: 0.2 10*3/uL (ref 0.0–0.5)
HCT: 41 % (ref 34.8–46.6)
HGB: 14.6 g/dL (ref 11.6–15.9)
LYMPH#: 2 10*3/uL (ref 0.9–3.3)
LYMPH%: 31.3 % (ref 14.0–48.0)
MCH: 34 pg (ref 26.0–34.0)
MCHC: 35.6 g/dL (ref 32.0–36.0)
MCV: 96 fL (ref 81–101)
MONO#: 0.8 10*3/uL (ref 0.1–0.9)
MONO%: 11.9 % (ref 0.0–13.0)
NEUT#: 3.4 10*3/uL (ref 1.5–6.5)
NEUT%: 53.3 % (ref 39.6–80.0)
Platelets: 312 10*3/uL (ref 145–400)
RBC: 4.29 10*6/uL (ref 3.70–5.32)
RDW: 12.6 % (ref 11.1–15.7)
WBC: 6.3 10*3/uL (ref 3.9–10.0)

## 2011-08-30 LAB — COMPREHENSIVE METABOLIC PANEL
ALT: 24 U/L (ref 0–35)
AST: 26 U/L (ref 0–37)
Albumin: 3.9 g/dL (ref 3.5–5.2)
Alkaline Phosphatase: 72 U/L (ref 39–117)
BUN: 16 mg/dL (ref 6–23)
CO2: 28 mEq/L (ref 19–32)
Calcium: 9.6 mg/dL (ref 8.4–10.5)
Chloride: 104 mEq/L (ref 96–112)
Creatinine, Ser: 0.95 mg/dL (ref 0.50–1.10)
Glucose, Bld: 189 mg/dL — ABNORMAL HIGH (ref 70–99)
Potassium: 4.2 mEq/L (ref 3.5–5.3)
Sodium: 141 mEq/L (ref 135–145)
Total Bilirubin: 1.3 mg/dL — ABNORMAL HIGH (ref 0.3–1.2)
Total Protein: 6.2 g/dL (ref 6.0–8.3)

## 2011-08-30 LAB — PROTIME-INR (CHCC SATELLITE)
INR: 1.9 — ABNORMAL LOW (ref 2.0–3.5)
Protime: 22.8 Seconds — ABNORMAL HIGH (ref 10.6–13.4)

## 2011-08-30 MED ORDER — CIPROFLOXACIN HCL 500 MG PO TABS
500.0000 mg | ORAL_TABLET | Freq: Two times a day (BID) | ORAL | Status: AC
Start: 2011-08-30 — End: 2011-09-09

## 2011-08-30 NOTE — Progress Notes (Signed)
This office note has been dictated.

## 2011-08-31 NOTE — Progress Notes (Signed)
CC:   Laurie Green, M.D. James L. Rolla Flatten., M.D.  DIAGNOSES: 1. Antiphospholipid antibody syndrome. 2. Nonalcoholic steatohepatitis. 3. Fibromyalgia. 4. Celiac disease.  CURRENT THERAPY:  Coumadin (7.5 mg p.o.) daily.  INTERIM HISTORY:  Laurie Green comes in for her followup.  She actually is doing fairly well.  She really has had no specific complaints since we last saw her.  She still has some abdominal bloating when she eats. She has not had any episodes with this diarrhea and nausea and vomiting that she has had before.  She watches her diet.  She was diagnosed with celiac disease, I think out at [facility].  She has had no bleeding. She has had no cough.  There has been no leg swelling.  She is does have varices.  She is on beta-blockers.  Her taste is still a little bit decreased.  However, she has not had any melena or bright red blood per rectum or hemoptysis.  PHYSICAL EXAMINATION:  This is a well-developed well-nourished white female in no obvious distress.  Vital signs:  Temperature of 98.1, pulse 63, respiratory rate 18, blood pressure 121/66.  Weight is 156.  Head and neck:  Normocephalic, atraumatic skull.  There are no ocular or oral lesions.  There are no palpable cervical or supraclavicular lymph nodes. Lungs:  Clear bilaterally.  Cardiac:  Regular rate and rhythm with a normal S1 and S2.  There are no murmurs, rubs or bruits.  Abdomen:  Soft abdomen.  She has a well-healed splenectomy scar.  She has no abdominal swelling.  There is no fluid wave.  There is some slight tenderness in the right lower quadrant.  There is no palpable hepatomegaly.  Back:  No tenderness over the spine, ribs, or hips.  Extremities:  No clubbing, cyanosis or edema.  Neurologic:  No  focal neurological deficits.  LABORATORY STUDIES:  White cell count of 6.3, hemoglobin 14.6, hematocrit 41, platelet count 312.  INR is 1.9.  IMPRESSION:  Laurie Green is a 66 year old white female  with antiphospholipid antibody syndrome.  She has not had any problems with DVT for probably about a 10 years.  She is on long-term Coumadin.  She has this checked monthly.  She is interested in doing some genetic testing.  I guess her mother had breast cancer in her 66s.  Laurie Green has not had any issues with cancer.  I think she would be very low probability for the BRCA mutation.  I will see about looking into this for her, trying to save her a lot of money with doing an unnecessary test.  We will follow her monthly for INR.  With her varices, we really have to stay on top of the INR.  I am leery about switching over to one of the newer oral anticoagulants because of the difficulty in reversal issue if she does have any variceal bleeding.  I will see Laurie Green back in 2 months.  ADDENDUM:  Laurie Green is getting over a UTI.  She said that she still has some urinary symptoms. Will go ahead and give her another 5 days of ciprofloxacin.    ______________________________ [name], M.D. PRE/MEDQ  D:  08/30/2011  T:  08/31/2011  Job:  [id number]

## 2011-09-26 ENCOUNTER — Other Ambulatory Visit: Payer: Self-pay | Admitting: Hematology & Oncology

## 2011-09-27 ENCOUNTER — Other Ambulatory Visit: Payer: PRIVATE HEALTH INSURANCE | Admitting: Lab

## 2011-09-27 ENCOUNTER — Other Ambulatory Visit (HOSPITAL_BASED_OUTPATIENT_CLINIC_OR_DEPARTMENT_OTHER): Payer: PRIVATE HEALTH INSURANCE | Admitting: Lab

## 2011-09-27 DIAGNOSIS — I82409 Acute embolism and thrombosis of unspecified deep veins of unspecified lower extremity: Secondary | ICD-10-CM

## 2011-09-27 DIAGNOSIS — I8390 Asymptomatic varicose veins of unspecified lower extremity: Secondary | ICD-10-CM

## 2011-09-27 DIAGNOSIS — K7581 Nonalcoholic steatohepatitis (NASH): Secondary | ICD-10-CM

## 2011-09-27 LAB — PROTIME-INR (CHCC SATELLITE)
INR: 2.4 (ref 2.0–3.5)
Protime: 28.8 Seconds — ABNORMAL HIGH (ref 10.6–13.4)

## 2011-10-25 ENCOUNTER — Ambulatory Visit (HOSPITAL_BASED_OUTPATIENT_CLINIC_OR_DEPARTMENT_OTHER): Payer: PRIVATE HEALTH INSURANCE | Admitting: Hematology & Oncology

## 2011-10-25 ENCOUNTER — Other Ambulatory Visit (HOSPITAL_BASED_OUTPATIENT_CLINIC_OR_DEPARTMENT_OTHER): Payer: PRIVATE HEALTH INSURANCE | Admitting: Lab

## 2011-10-25 VITALS — BP 126/81 | HR 59 | Temp 97.6°F | Ht 63.0 in | Wt 150.0 lb

## 2011-10-25 DIAGNOSIS — I82409 Acute embolism and thrombosis of unspecified deep veins of unspecified lower extremity: Secondary | ICD-10-CM

## 2011-10-25 DIAGNOSIS — K7581 Nonalcoholic steatohepatitis (NASH): Secondary | ICD-10-CM

## 2011-10-25 DIAGNOSIS — I8390 Asymptomatic varicose veins of unspecified lower extremity: Secondary | ICD-10-CM

## 2011-10-25 DIAGNOSIS — R109 Unspecified abdominal pain: Secondary | ICD-10-CM

## 2011-10-25 DIAGNOSIS — IMO0001 Reserved for inherently not codable concepts without codable children: Secondary | ICD-10-CM

## 2011-10-25 DIAGNOSIS — D6859 Other primary thrombophilia: Secondary | ICD-10-CM

## 2011-10-25 DIAGNOSIS — Z7901 Long term (current) use of anticoagulants: Secondary | ICD-10-CM

## 2011-10-25 LAB — CBC WITH DIFFERENTIAL (CANCER CENTER ONLY)
BASO#: 0 10*3/uL (ref 0.0–0.2)
BASO%: 0.4 % (ref 0.0–2.0)
EOS%: 3.1 % (ref 0.0–7.0)
Eosinophils Absolute: 0.2 10*3/uL (ref 0.0–0.5)
HCT: 41.9 % (ref 34.8–46.6)
HGB: 15.1 g/dL (ref 11.6–15.9)
LYMPH#: 2.2 10*3/uL (ref 0.9–3.3)
LYMPH%: 30 % (ref 14.0–48.0)
MCH: 33.7 pg (ref 26.0–34.0)
MCHC: 36 g/dL (ref 32.0–36.0)
MCV: 94 fL (ref 81–101)
MONO#: 0.8 10*3/uL (ref 0.1–0.9)
MONO%: 10.3 % (ref 0.0–13.0)
NEUT#: 4.1 10*3/uL (ref 1.5–6.5)
NEUT%: 56.2 % (ref 39.6–80.0)
Platelets: 314 10*3/uL (ref 145–400)
RBC: 4.48 10*6/uL (ref 3.70–5.32)
RDW: 13.1 % (ref 11.1–15.7)
WBC: 7.4 10*3/uL (ref 3.9–10.0)

## 2011-10-25 LAB — COMPREHENSIVE METABOLIC PANEL
ALT: 27 U/L (ref 0–35)
AST: 26 U/L (ref 0–37)
Albumin: 3.9 g/dL (ref 3.5–5.2)
Alkaline Phosphatase: 90 U/L (ref 39–117)
BUN: 16 mg/dL (ref 6–23)
CO2: 28 mEq/L (ref 19–32)
Calcium: 9.4 mg/dL (ref 8.4–10.5)
Chloride: 104 mEq/L (ref 96–112)
Creatinine, Ser: 0.77 mg/dL (ref 0.50–1.10)
Glucose, Bld: 197 mg/dL — ABNORMAL HIGH (ref 70–99)
Potassium: 4 mEq/L (ref 3.5–5.3)
Sodium: 140 mEq/L (ref 135–145)
Total Bilirubin: 1.5 mg/dL — ABNORMAL HIGH (ref 0.3–1.2)
Total Protein: 6.4 g/dL (ref 6.0–8.3)

## 2011-10-25 LAB — PROTIME-INR (CHCC SATELLITE)
INR: 2.2 (ref 2.0–3.5)
Protime: 26.4 Seconds — ABNORMAL HIGH (ref 10.6–13.4)

## 2011-10-25 NOTE — Progress Notes (Signed)
This office note has been dictated.

## 2011-10-26 NOTE — Progress Notes (Signed)
CC:   Laurie Green, M.D. James L. Rolla Flatten., M.D.  DIAGNOSIS: 1. Antiphospholipid antibody syndrome. 2. Severe fibromyalgia. 3. Nonalcoholic steatohepatitis.  CURRENT THERAPY:  Coumadin 7.5 mg p.o. daily.  INTERIM HISTORY:  Laurie Green comes in for followup.  She is really worn out.  She has not slept in quite a while.  Her fibromyalgia is causing her quite a bit of problems.  As such, we will see if she will not do well with low-dose naltrexone.  This has had some good success in some clinical trials.  The dose is only 4.5 mg a day.  I do not see that this would be a problem for her and could hopefully provide her some relief.  She continues on her Coumadin.  She has had no problems bleeding.  She is also on some beta blockers to help with varices.  She does have intermittent abdominal pain.  I think that this abdominal pain more is intermittent small bowel obstructions.  The pain lasts for several hours and then improves.  She does have occasional vomiting with it.  She has had no leg swelling.  She has had occasional headaches.  She had a little bit of a cough for a couple of weeks, but this is improving.  PHYSICAL EXAMINATION:  General:  This is a well-developed, well- nourished white female in no obvious distress.  Vital Signs:  97.5, pulse 59, respiratory rate 20, blood pressure 126/81.  Weight is 150. Head and Neck Exam:  Shows a normocephalic, atraumatic skull.  There are no ocular or oral lesions.  There are no palpable cervical or supraclavicular lymph nodes.  Lungs:  Clear bilaterally.  Cardiac Exam: Regular rate and rhythm with normal S1 and S2.  There are no murmurs, rubs or bruits.  Abdominal Exam:  Soft.  She has multiple laparotomy scars.  She has no fluid wave.  There is no guarding or rebound tenderness.  There is no palpable hepatomegaly.  She has a well-healed splenectomy scar.  Extremities:  Show some tenderness over the long bones.  No joint erythema  or warmth is noted bilaterally.  Neurological Exam:  Shows no focal neurological deficits.  LABORATORY STUDIES:  White cell count is 7.4, hemoglobin 15, hematocrit 43, platelet count 314.  INR is 2.2.  IMPRESSION:  Laurie Green is a 66 year old white female with a history of antiphospholipid antibody syndrome.  Thankfully, she has not had any thromboembolic events for about 10 years.  She is on lifelong Coumadin. She does have the nonalcoholic steatohepatitis but has not had any bleeding.  Her main problem now is the fibromyalgia.  She truly is worn out.  She is not sleeping.  She just aches all the time.  Hopefully, naltrexone will help her.  I want to see her back in another 6 weeks.  By then, she will have had a good trial of the naltrexone, and hopefully, we will find that she is feeling better.    ______________________________ [name], M.D. PRE/MEDQ  D:  10/25/2011  T:  10/25/2011  Job:  [id number]

## 2011-10-27 ENCOUNTER — Telehealth: Payer: Self-pay | Admitting: Hematology & Oncology

## 2011-10-27 NOTE — Telephone Encounter (Signed)
Pt called and cx 11/22/11 apt and resch for 12/13/11

## 2011-11-01 ENCOUNTER — Other Ambulatory Visit: Payer: PRIVATE HEALTH INSURANCE | Admitting: Lab

## 2011-11-01 ENCOUNTER — Ambulatory Visit: Payer: PRIVATE HEALTH INSURANCE | Admitting: Hematology & Oncology

## 2011-11-02 ENCOUNTER — Telehealth: Payer: Self-pay | Admitting: *Deleted

## 2011-11-02 NOTE — Telephone Encounter (Signed)
Patient called wanting to know lab results.  Labs are all good per dr. Marin Olp.

## 2011-11-22 ENCOUNTER — Other Ambulatory Visit: Payer: Self-pay | Admitting: Lab

## 2011-11-22 ENCOUNTER — Ambulatory Visit: Payer: PRIVATE HEALTH INSURANCE | Admitting: Hematology & Oncology

## 2011-11-22 ENCOUNTER — Other Ambulatory Visit: Payer: PRIVATE HEALTH INSURANCE | Admitting: Lab

## 2011-12-13 ENCOUNTER — Other Ambulatory Visit (HOSPITAL_BASED_OUTPATIENT_CLINIC_OR_DEPARTMENT_OTHER): Payer: PRIVATE HEALTH INSURANCE | Admitting: Lab

## 2011-12-13 ENCOUNTER — Ambulatory Visit (HOSPITAL_BASED_OUTPATIENT_CLINIC_OR_DEPARTMENT_OTHER): Payer: PRIVATE HEALTH INSURANCE | Admitting: Hematology & Oncology

## 2011-12-13 VITALS — BP 145/63 | HR 60 | Temp 98.0°F | Resp 18 | Ht 63.0 in | Wt 150.0 lb

## 2011-12-13 DIAGNOSIS — I82409 Acute embolism and thrombosis of unspecified deep veins of unspecified lower extremity: Secondary | ICD-10-CM

## 2011-12-13 DIAGNOSIS — Z7901 Long term (current) use of anticoagulants: Secondary | ICD-10-CM

## 2011-12-13 DIAGNOSIS — K7581 Nonalcoholic steatohepatitis (NASH): Secondary | ICD-10-CM

## 2011-12-13 DIAGNOSIS — D6859 Other primary thrombophilia: Secondary | ICD-10-CM

## 2011-12-13 DIAGNOSIS — I8390 Asymptomatic varicose veins of unspecified lower extremity: Secondary | ICD-10-CM

## 2011-12-13 LAB — CBC WITH DIFFERENTIAL (CANCER CENTER ONLY)
BASO#: 0.1 10*3/uL (ref 0.0–0.2)
BASO%: 0.7 % (ref 0.0–2.0)
EOS%: 3 % (ref 0.0–7.0)
Eosinophils Absolute: 0.3 10*3/uL (ref 0.0–0.5)
HCT: 42.1 % (ref 34.8–46.6)
HGB: 15.1 g/dL (ref 11.6–15.9)
LYMPH#: 2.4 10*3/uL (ref 0.9–3.3)
LYMPH%: 28.6 % (ref 14.0–48.0)
MCH: 33.9 pg (ref 26.0–34.0)
MCHC: 35.9 g/dL (ref 32.0–36.0)
MCV: 94 fL (ref 81–101)
MONO#: 0.9 10*3/uL (ref 0.1–0.9)
MONO%: 10 % (ref 0.0–13.0)
NEUT#: 4.9 10*3/uL (ref 1.5–6.5)
NEUT%: 57.7 % (ref 39.6–80.0)
Platelets: 298 10*3/uL (ref 145–400)
RBC: 4.46 10*6/uL (ref 3.70–5.32)
RDW: 12.8 % (ref 11.1–15.7)
WBC: 8.5 10*3/uL (ref 3.9–10.0)

## 2011-12-13 LAB — PROTIME-INR (CHCC SATELLITE)
INR: 2.2 (ref 2.0–3.5)
Protime: 26.4 Seconds — ABNORMAL HIGH (ref 10.6–13.4)

## 2011-12-13 NOTE — Patient Instructions (Signed)
Call with bleeding

## 2011-12-13 NOTE — Progress Notes (Signed)
This office note has been dictated.

## 2011-12-14 NOTE — Progress Notes (Signed)
CC:   Laurie Green, M.D. James L. Rolla Flatten., M.D.  DIAGNOSES: 1. Antiphospholipid antibody syndrome. 2. Nonalcoholic steatohepatitis. 3. Fibromyalgia.  CURRENT THERAPY:  Coumadin 7 5 mg p.o. daily.  INTERIM HISTORY:  Laurie Green comes in for her followup.  She is doing a little bit better.  We tried her on low-dose naltrexone.  Unfortunately, she did not do too well with this.  She got nauseated with this.  As such, she really did not wish to take any more of this.  She has had no problems with bleeding.  She does have cirrhosis to some degree.  She does have varices.  She is on propranolol for the varices.  So far, pain-wise she has been doing real well.  She has had no issues with intermittent small bowel obstruction.  She has had no problems with bowels or bladder.  There has been no leg swelling.  We follow her lab work routinely.  Her liver tests have all come back fine.  PHYSICAL EXAMINATION:  This is a well-developed, well-nourished white female in no obvious distress.  Vital signs:  98, pulse 60, respiratory rate 18, blood pressure 145/63.  Weight is 150.  Head and neck: Normocephalic, atraumatic skull.  There are no ocular or oral lesions. There is no scleral icterus.  She has no adenopathy in the neck. Thyroid is nonpalpable.  Lungs:  Clear bilaterally.  Cardiac:  Regular rate and rhythm with a normal S1 and S2.  There are no murmurs, rubs, or bruits.  Abdomen:  Soft with good bowel sounds.  She has well-healed laparotomy scars.  She has a splenectomy scar that is well-healed.  She has no fluid wave.  There is no palpable hepatomegaly.  Back:  No tenderness over the spine, ribs, or hips.  Extremities:  No clubbing, cyanosis or edema.  She has good range of motion of her joints. Neurologic:  No focal neurological deficits.  Skin:  No rashes, ecchymosis or petechia.  LABORATORY STUDIES:  White cell count is 8.5, hemoglobin 15.1, hematocrit 42.1, platelet count  298.  INR is 2.2.  IMPRESSION:  Laurie Green is a really nice 66 year old white female with antiphospholipid antibody syndrome.  She has had problems with thromboembolic events.  She is on lifelong Coumadin.  We do not have her on aspirin because of the issues with varices.  Thankfully, she has not had any kind a variceal bleeding.  We will go ahead and get her back in another couple of months now.  She is holding relatively stable.  We will make sure she comes back in 1 just so that we can follow up with her lab work.    ______________________________ [name], M.D. PRE/MEDQ  D:  12/13/2011  T:  12/14/2011  Job:  [id number]

## 2011-12-20 ENCOUNTER — Other Ambulatory Visit: Payer: PRIVATE HEALTH INSURANCE | Admitting: Lab

## 2011-12-20 ENCOUNTER — Ambulatory Visit: Payer: PRIVATE HEALTH INSURANCE | Admitting: Hematology & Oncology

## 2012-01-10 ENCOUNTER — Other Ambulatory Visit (HOSPITAL_BASED_OUTPATIENT_CLINIC_OR_DEPARTMENT_OTHER): Payer: PRIVATE HEALTH INSURANCE | Admitting: Lab

## 2012-01-10 DIAGNOSIS — I82409 Acute embolism and thrombosis of unspecified deep veins of unspecified lower extremity: Secondary | ICD-10-CM

## 2012-01-10 DIAGNOSIS — K7581 Nonalcoholic steatohepatitis (NASH): Secondary | ICD-10-CM

## 2012-01-10 DIAGNOSIS — D6859 Other primary thrombophilia: Secondary | ICD-10-CM

## 2012-01-10 LAB — COMPREHENSIVE METABOLIC PANEL
ALT: 26 U/L (ref 0–35)
AST: 29 U/L (ref 0–37)
Albumin: 4 g/dL (ref 3.5–5.2)
Alkaline Phosphatase: 81 U/L (ref 39–117)
BUN: 16 mg/dL (ref 6–23)
CO2: 26 mEq/L (ref 19–32)
Calcium: 9.6 mg/dL (ref 8.4–10.5)
Chloride: 105 mEq/L (ref 96–112)
Creatinine, Ser: 0.72 mg/dL (ref 0.50–1.10)
Glucose, Bld: 115 mg/dL — ABNORMAL HIGH (ref 70–99)
Potassium: 3.9 mEq/L (ref 3.5–5.3)
Sodium: 142 mEq/L (ref 135–145)
Total Bilirubin: 1.3 mg/dL — ABNORMAL HIGH (ref 0.3–1.2)
Total Protein: 6.6 g/dL (ref 6.0–8.3)

## 2012-01-10 LAB — CBC WITH DIFFERENTIAL (CANCER CENTER ONLY)
BASO#: 0.1 10*3/uL (ref 0.0–0.2)
BASO%: 0.6 % (ref 0.0–2.0)
EOS%: 2.7 % (ref 0.0–7.0)
Eosinophils Absolute: 0.2 10*3/uL (ref 0.0–0.5)
HCT: 41.7 % (ref 34.8–46.6)
HGB: 15.2 g/dL (ref 11.6–15.9)
LYMPH#: 2.3 10*3/uL (ref 0.9–3.3)
LYMPH%: 28 % (ref 14.0–48.0)
MCH: 34 pg (ref 26.0–34.0)
MCHC: 36.5 g/dL — ABNORMAL HIGH (ref 32.0–36.0)
MCV: 93 fL (ref 81–101)
MONO#: 1 10*3/uL — ABNORMAL HIGH (ref 0.1–0.9)
MONO%: 11.5 % (ref 0.0–13.0)
NEUT#: 4.8 10*3/uL (ref 1.5–6.5)
NEUT%: 57.2 % (ref 39.6–80.0)
Platelets: 316 10*3/uL (ref 145–400)
RBC: 4.47 10*6/uL (ref 3.70–5.32)
RDW: 12.8 % (ref 11.1–15.7)
WBC: 8.4 10*3/uL (ref 3.9–10.0)

## 2012-01-10 LAB — PROTIME-INR (CHCC SATELLITE)
INR: 2 (ref 2.0–3.5)
Protime: 24 Seconds — ABNORMAL HIGH (ref 10.6–13.4)

## 2012-01-11 ENCOUNTER — Telehealth: Payer: Self-pay | Admitting: *Deleted

## 2012-01-11 NOTE — Telephone Encounter (Signed)
Called patient to let her know that her liver tests are normal per dr. Marin Olp and her coumadin level is good. Left message on patients personal cell phone

## 2012-01-11 NOTE — Telephone Encounter (Signed)
Message copied by Rico Ala on Thu Jan 11, 2012 11:33 AM ------      Message from: Burney Gauze R      Created: Thu Jan 11, 2012  7:25 AM       Call - her liver tests are normal.  Coumadin level is good.  [name]

## 2012-02-03 ENCOUNTER — Other Ambulatory Visit: Payer: Self-pay | Admitting: Hematology & Oncology

## 2012-02-08 ENCOUNTER — Other Ambulatory Visit: Payer: Self-pay | Admitting: Hematology & Oncology

## 2012-02-14 ENCOUNTER — Other Ambulatory Visit (HOSPITAL_BASED_OUTPATIENT_CLINIC_OR_DEPARTMENT_OTHER): Payer: PRIVATE HEALTH INSURANCE | Admitting: Lab

## 2012-02-14 ENCOUNTER — Ambulatory Visit (HOSPITAL_BASED_OUTPATIENT_CLINIC_OR_DEPARTMENT_OTHER): Payer: PRIVATE HEALTH INSURANCE | Admitting: Hematology & Oncology

## 2012-02-14 VITALS — BP 140/67 | HR 66 | Temp 97.5°F | Resp 18 | Ht 63.0 in | Wt 154.0 lb

## 2012-02-14 DIAGNOSIS — K7460 Unspecified cirrhosis of liver: Secondary | ICD-10-CM

## 2012-02-14 DIAGNOSIS — R1012 Left upper quadrant pain: Secondary | ICD-10-CM

## 2012-02-14 DIAGNOSIS — I8390 Asymptomatic varicose veins of unspecified lower extremity: Secondary | ICD-10-CM

## 2012-02-14 DIAGNOSIS — K7581 Nonalcoholic steatohepatitis (NASH): Secondary | ICD-10-CM

## 2012-02-14 DIAGNOSIS — Z86718 Personal history of other venous thrombosis and embolism: Secondary | ICD-10-CM

## 2012-02-14 DIAGNOSIS — Z7901 Long term (current) use of anticoagulants: Secondary | ICD-10-CM

## 2012-02-14 DIAGNOSIS — I82409 Acute embolism and thrombosis of unspecified deep veins of unspecified lower extremity: Secondary | ICD-10-CM

## 2012-02-14 LAB — PROTIME-INR (CHCC SATELLITE)
INR: 2.4 (ref 2.0–3.5)
Protime: 28.8 Seconds — ABNORMAL HIGH (ref 10.6–13.4)

## 2012-02-14 LAB — CBC WITH DIFFERENTIAL (CANCER CENTER ONLY)
BASO#: 0 10*3/uL (ref 0.0–0.2)
BASO%: 0.6 % (ref 0.0–2.0)
EOS%: 2.4 % (ref 0.0–7.0)
Eosinophils Absolute: 0.2 10*3/uL (ref 0.0–0.5)
HCT: 43.2 % (ref 34.8–46.6)
HGB: 15.4 g/dL (ref 11.6–15.9)
LYMPH#: 2.2 10*3/uL (ref 0.9–3.3)
LYMPH%: 31.3 % (ref 14.0–48.0)
MCH: 33.7 pg (ref 26.0–34.0)
MCHC: 35.6 g/dL (ref 32.0–36.0)
MCV: 95 fL (ref 81–101)
MONO#: 0.9 10*3/uL (ref 0.1–0.9)
MONO%: 12.6 % (ref 0.0–13.0)
NEUT#: 3.7 10*3/uL (ref 1.5–6.5)
NEUT%: 53.1 % (ref 39.6–80.0)
Platelets: 316 10*3/uL (ref 145–400)
RBC: 4.57 10*6/uL (ref 3.70–5.32)
RDW: 13 % (ref 11.1–15.7)
WBC: 7 10*3/uL (ref 3.9–10.0)

## 2012-02-14 NOTE — Progress Notes (Signed)
This office note has been dictated.

## 2012-02-15 LAB — COMPREHENSIVE METABOLIC PANEL
ALT: 26 U/L (ref 0–35)
AST: 29 U/L (ref 0–37)
Albumin: 4.3 g/dL (ref 3.5–5.2)
Alkaline Phosphatase: 94 U/L (ref 39–117)
BUN: 16 mg/dL (ref 6–23)
CO2: 22 mEq/L (ref 19–32)
Calcium: 10 mg/dL (ref 8.4–10.5)
Chloride: 103 mEq/L (ref 96–112)
Creatinine, Ser: 0.72 mg/dL (ref 0.50–1.10)
Glucose, Bld: 115 mg/dL — ABNORMAL HIGH (ref 70–99)
Potassium: 4 mEq/L (ref 3.5–5.3)
Sodium: 140 mEq/L (ref 135–145)
Total Bilirubin: 1.3 mg/dL — ABNORMAL HIGH (ref 0.3–1.2)
Total Protein: 6.7 g/dL (ref 6.0–8.3)

## 2012-02-15 LAB — CARDIOLIPIN ANTIBODIES, IGG, IGM, IGA
Anticardiolipin IgA: 8 APL U/mL (ref <22)
Anticardiolipin IgG: 10 GPL U/mL (ref <23)
Anticardiolipin IgM: 1 MPL U/mL (ref <11)

## 2012-02-15 LAB — VITAMIN D 25 HYDROXY (VIT D DEFICIENCY, FRACTURES): Vit D, 25-Hydroxy: 45 ng/mL (ref 30–89)

## 2012-02-15 LAB — FERRITIN: Ferritin: 98 ng/mL (ref 10–291)

## 2012-02-15 NOTE — Progress Notes (Signed)
CC:   Laurie Green, M.D. James L. Rolla Flatten., M.D.  DIAGNOSES: 1. Antiphospholipid antibody syndrome with a history of multiple deep     vein thromboses. 2. Nonalcoholic steatohepatitis (NASH). 3. Fibromyalgia. 4. 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  CURRENT THERAPY:  Coumadin 7.5 mg p.o. daily.  INTERIM HISTORY:  Ms. Cutter comes in for followup.  Unfortunately, she is still having issues with abdominal pain.  She is having a lot of pain in the left upper quadrant.  She has had her spleen out already.  I suspect that she may have adhesions that could be affecting her intestines.  She said that she gets some bloating over in the left side of her abdomen.  She sees Dr. Oletta Lamas of GI.  She is going to make an appointment to see him for an evaluation.  I suspect that she may need to have a scan done.  She has had some nausea but no vomiting.  She has had no problems with bowels or bladder.  She has had no leg swelling.  She has had no cough.  She was prescribed Neurontin.  This is for diabetic neuropathy.  She has not yet taken it.  She just wanted to make sure that it was okay to take with her liver.  From my point of view, I told her that I thought it was going to be okay.  Her labs were being checked routinely.  PHYSICAL EXAMINATION:  This is a well-developed, well-nourished white female in no obvious distress.  Vital signs:  97.5, pulse 67, respiratory rate 18, blood pressure 140/67.  Weight is 154.  Head and neck:  Normocephalic, atraumatic skull.  There are no ocular or oral lesions.  There are no palpable cervical or supraclavicular lymph nodes. Lungs:  Clear bilaterally.  Cardiac:  Regular rate and rhythm with a normal S1 and S2.  There are no murmurs, rubs or bruits.  Abdomen:  Soft with good bowel sounds.  She has well-healed laparotomy scars.  There is no fluid wave.  There is some slight distention over on the left abdomen.  She may have a little bit  of a hernia at the site of her splenectomy scar.  There is no palpable hepatomegaly.  There is no obvious fluid wave.  Extremities:  No clubbing, cyanosis or edema. Skin:  No rashes.  There is no ecchymosis or petechia.  LABORATORY STUDIES:  White cell count is 7, hemoglobin 15.4, hematocrit 43.2, platelet count 316.  Her INR is 2.4.  IMPRESSION:  Mr. Owensby is a 66 year old white female with history of deep vein thromboses.  She has not had a deep vein thrombosis now for probably 10 years since I have been seeing her.  She is on lifelong anticoagulation with Coumadin.  She does have the varices, but thankfully she has not had any bleeding from the varices.  We are watching her INR every month.  Again, I do not see any problems with her being on the Neurontin.  Again, she will see Dr. Oletta Lamas for the abdominal issues.  I will plan to see her back myself in 2 months.  I want to get her back in 1 month for lab work.    ______________________________ [name], M.D. PRE/MEDQ  D:  02/14/2012  T:  02/15/2012  Job:  [id number]

## 2012-03-13 ENCOUNTER — Other Ambulatory Visit: Payer: PRIVATE HEALTH INSURANCE | Admitting: Lab

## 2012-03-13 ENCOUNTER — Other Ambulatory Visit (HOSPITAL_BASED_OUTPATIENT_CLINIC_OR_DEPARTMENT_OTHER): Payer: PRIVATE HEALTH INSURANCE

## 2012-03-13 DIAGNOSIS — K7460 Unspecified cirrhosis of liver: Secondary | ICD-10-CM

## 2012-03-13 DIAGNOSIS — K7581 Nonalcoholic steatohepatitis (NASH): Secondary | ICD-10-CM

## 2012-03-13 DIAGNOSIS — I82409 Acute embolism and thrombosis of unspecified deep veins of unspecified lower extremity: Secondary | ICD-10-CM

## 2012-03-13 DIAGNOSIS — I8390 Asymptomatic varicose veins of unspecified lower extremity: Secondary | ICD-10-CM

## 2012-03-13 DIAGNOSIS — D6859 Other primary thrombophilia: Secondary | ICD-10-CM

## 2012-03-13 LAB — PROTIME-INR (CHCC SATELLITE)
INR: 1.9 — ABNORMAL LOW (ref 2.0–3.5)
Protime: 22.8 Seconds — ABNORMAL HIGH (ref 10.6–13.4)

## 2012-03-21 ENCOUNTER — Telehealth: Payer: Self-pay | Admitting: *Deleted

## 2012-03-21 NOTE — Telephone Encounter (Signed)
Called patient to tell her not to change the coumadin dose per dr. Marin Olp

## 2012-03-21 NOTE — Telephone Encounter (Signed)
Message copied by Rico Ala on Thu Mar 21, 2012 10:03 AM ------      Message from: [name]      Created: Tue Mar 19, 2012  7:34 AM       Call and tell her not to change her Coumadin dose. Thanks. [name]

## 2012-04-15 ENCOUNTER — Other Ambulatory Visit (HOSPITAL_BASED_OUTPATIENT_CLINIC_OR_DEPARTMENT_OTHER): Payer: PRIVATE HEALTH INSURANCE | Admitting: Lab

## 2012-04-15 ENCOUNTER — Ambulatory Visit (HOSPITAL_BASED_OUTPATIENT_CLINIC_OR_DEPARTMENT_OTHER): Payer: PRIVATE HEALTH INSURANCE | Admitting: Hematology & Oncology

## 2012-04-15 ENCOUNTER — Ambulatory Visit (HOSPITAL_BASED_OUTPATIENT_CLINIC_OR_DEPARTMENT_OTHER): Payer: PRIVATE HEALTH INSURANCE

## 2012-04-15 VITALS — BP 136/59 | HR 57 | Temp 97.7°F | Resp 16 | Ht 63.0 in | Wt 155.0 lb

## 2012-04-15 DIAGNOSIS — D6859 Other primary thrombophilia: Secondary | ICD-10-CM

## 2012-04-15 DIAGNOSIS — I82409 Acute embolism and thrombosis of unspecified deep veins of unspecified lower extremity: Secondary | ICD-10-CM

## 2012-04-15 DIAGNOSIS — K7460 Unspecified cirrhosis of liver: Secondary | ICD-10-CM

## 2012-04-15 DIAGNOSIS — Z86718 Personal history of other venous thrombosis and embolism: Secondary | ICD-10-CM

## 2012-04-15 LAB — CBC WITH DIFFERENTIAL (CANCER CENTER ONLY)
BASO#: 0 10*3/uL (ref 0.0–0.2)
BASO%: 0.6 % (ref 0.0–2.0)
EOS%: 3.5 % (ref 0.0–7.0)
Eosinophils Absolute: 0.2 10*3/uL (ref 0.0–0.5)
HCT: 43.3 % (ref 34.8–46.6)
HGB: 15.1 g/dL (ref 11.6–15.9)
LYMPH#: 1.7 10*3/uL (ref 0.9–3.3)
LYMPH%: 31 % (ref 14.0–48.0)
MCH: 33.3 pg (ref 26.0–34.0)
MCHC: 34.9 g/dL (ref 32.0–36.0)
MCV: 96 fL (ref 81–101)
MONO#: 0.7 10*3/uL (ref 0.1–0.9)
MONO%: 13.4 % — ABNORMAL HIGH (ref 0.0–13.0)
NEUT#: 2.8 10*3/uL (ref 1.5–6.5)
NEUT%: 51.5 % (ref 39.6–80.0)
Platelets: 313 10*3/uL (ref 145–400)
RBC: 4.53 10*6/uL (ref 3.70–5.32)
RDW: 12.6 % (ref 11.1–15.7)
WBC: 5.4 10*3/uL (ref 3.9–10.0)

## 2012-04-15 LAB — PROTIME-INR (CHCC SATELLITE)
INR: 1.3 — ABNORMAL LOW (ref 2.0–3.5)
Protime: 15.6 Seconds — ABNORMAL HIGH (ref 10.6–13.4)

## 2012-04-15 LAB — COMPREHENSIVE METABOLIC PANEL
ALT: 26 U/L (ref 0–35)
AST: 28 U/L (ref 0–37)
Albumin: 4.2 g/dL (ref 3.5–5.2)
Alkaline Phosphatase: 79 U/L (ref 39–117)
BUN: 14 mg/dL (ref 6–23)
CO2: 31 mEq/L (ref 19–32)
Calcium: 9.5 mg/dL (ref 8.4–10.5)
Chloride: 103 mEq/L (ref 96–112)
Creatinine, Ser: 0.72 mg/dL (ref 0.50–1.10)
Glucose, Bld: 199 mg/dL — ABNORMAL HIGH (ref 70–99)
Potassium: 4 mEq/L (ref 3.5–5.3)
Sodium: 142 mEq/L (ref 135–145)
Total Bilirubin: 1.5 mg/dL — ABNORMAL HIGH (ref 0.3–1.2)
Total Protein: 6.6 g/dL (ref 6.0–8.3)

## 2012-04-15 MED ORDER — FONDAPARINUX SODIUM 7.5 MG/0.6ML ~~LOC~~ SOLN
7.5000 mg | Freq: Once | SUBCUTANEOUS | Status: AC
  Administered 2012-04-15: 7.5 mg via SUBCUTANEOUS
  Filled 2012-04-15: qty 0.6

## 2012-04-15 NOTE — Patient Instructions (Addendum)
Fondaparinux Injections Fondaparinux (Arixtra) injection is a blood thinner (anticoagulant) medication that "thins" the blood and helps to prevent blood clots from developing in your veins. If blood clots develop and are left untreated, they can travel to your heart, lungs, or brain. These clots can cause serious illness and can be fatal. Blood clots can form due to:  Prolonged immobility such as:  People who cannot get out of bed (bedridden).  Sitting for long periods of time, such as long airplane flights.  Surgery, especially operations that involve:  Orthopedic (bones and joints).  The abdomen.  Obesity.  Certain heart conditions.  Certain cancers.  Hormone Replacement Therapy (HRT) or birth control pills (uncommon but possible). WHAT IF A BLOOD CLOT HAS ALREADY DEVELOPED? If a blood clot has developed, caregivers can use fondaparinux to treat clots in the legs or lungs. In addition to fondaparinux, another blood thinner called warfarin (Coumadin) will be started 2 to 3 days after fondaparinux has been started. warfarin is a pill and must be taken simultaneously with fondaparinux until the warfarin has begun to work.  Your caregiver may use a blood test called an INR or International Normalization Ratio to know when there is enough warfarin in the blood. Fondaparinux may be stopped when the INR level is between 2.0 to 3.0. This means that your blood is at the necessary and best level to treat the clots that have formed.  OTHER FONDAPARINUX USES In [place], fondaparinux is sometimes used to help when there is not enough blood flow to the heart. HOME CARE INSTRUCTIONS   Fondaparinux should not be used if you have allergies to the medication, heparin or pork products.  Before giving your medication, make sure the solution is a clear and colorless. If your medication becomes discolored or has particles in the bottle, do not use it. Notify your caregiver right away.  Keep your  medication safely stored at room temperatures.  You will be instructed by your caregiver how to give fondaparinux injections. HOW TO INJECT FONDAPARINUX 1. Fondaparinux is a shot that is given in the stomach (abdomen). Change (rotate) the injection site each time you give yourself a shot. 2. Twist the plunger cap and remove it. 3. Hold the syringe with either hand and use your other hand to twist the rigid needle guard (covers the needle) counter-clockwise. Pull the rigid needle guard straight off the needle. Discard the needle guard. 4. When using the pre-filled syringes, do not expel the air bubble from the syringe before the injection. The air bubble helps to inject all of the medication when it is given. 5. The injection will be given just underneath the skin into the fat of the belly (subcutaneously). The shots should be injected around the outside of the belly (abdominal wall). Change the place where you give the shot each time. The whole length of the needle should be introduced into a skin fold held between the thumb and forefinger; the skin fold should be held throughout the injection. 6. Inject fondaparinux by pushing the plunger to the bottom of the syringe. Push the plunger rod firmly with your thumb as far as it will go. This will ensure you have injected all of the medication. Do not rub the injection site after completion of the injection. This increases bruising. 7. Remove the syringe from the injection site, keeping your finger on the plunger rod. Be careful not to stick yourself or others. 8. Fondaparinux injection pre-filled syringes and graduated pre-filled syringes are available with  a system that shields the needle after injection. After injection and the syringe is empty, set off the safety system by firmly pushing the plunger rod. The protective sleeve will automatically cover the needle and you can hear a click. The click means your needle is safely covered. 9. Get rid of the  syringe in the nearest needle box (sharps container). FONDAPARINUX WARNINGS Problems with fondaparinux are rare. However, side effects and complications can occur, such as:  Serious side effects can include:  If you are taking fondaparinux or other low-molecular-weight heparins and have an epidural or spinal anesthesia or a spinal tap, you are at risk of developing a blood clot in or around the spine. This condition could result in long-term or permanent paralysis.  A condition called heparin-induced thrombocytopenia (HIT) can occur with fondaparinux use. This is where your platelet count drops. HIT is rare. However, your caregiver will perform lab work to check your blood levels. If you have had this condition before, you should tell your caregiver.  Because fondaparinux thins your blood, complications may include bleeding into the bowel or kidneys.  Mild side effects may include:  Bruising around the injection site.  Mild irritation at the site of injection, such as pain, itching, or redness of skin. SEEK IMMEDIATE MEDICAL CARE IF:  You develop bleeding problems such as:  Vomiting blood or coughing up blood.  Blood in your urine.  Blood in your stool or your stool has a dark, tarry, or coffee ground appearance.  You develop any rashes on your skin, such as:  Multiple red "dots."  Long red streaks.  You have large areas of bruising on your skin.  A sudden nosebleed that does not stop after 15 to 20 minutes.  You have any worsening of the condition that led you to fondaparinux treatment.  You develop chest pain or shortness of breath. If the chest pain or shortness of breath is severe, call your local emergency service immediately! MAKE SURE YOU:   Understand these instructions.  Will watch your condition.  Will get help right away if you are not doing well or get worse. Document Released: 06/30/2008 Document Revised: 06/26/2011 Document Reviewed: 06/30/2008 [facility]  Patient Information 2013 [place].

## 2012-04-15 NOTE — Progress Notes (Signed)
This office note has been dictated.

## 2012-04-19 ENCOUNTER — Other Ambulatory Visit (HOSPITAL_BASED_OUTPATIENT_CLINIC_OR_DEPARTMENT_OTHER): Payer: BC Managed Care – PPO | Admitting: Lab

## 2012-04-19 DIAGNOSIS — I82409 Acute embolism and thrombosis of unspecified deep veins of unspecified lower extremity: Secondary | ICD-10-CM

## 2012-04-19 LAB — PROTIME-INR (CHCC SATELLITE)
INR: 1.9 — ABNORMAL LOW (ref 2.0–3.5)
Protime: 22.8 Seconds — ABNORMAL HIGH (ref 10.6–13.4)

## 2012-04-24 ENCOUNTER — Other Ambulatory Visit: Payer: Self-pay | Admitting: Neurology

## 2012-04-24 DIAGNOSIS — R269 Unspecified abnormalities of gait and mobility: Secondary | ICD-10-CM

## 2012-04-24 DIAGNOSIS — R209 Unspecified disturbances of skin sensation: Secondary | ICD-10-CM

## 2012-04-25 ENCOUNTER — Other Ambulatory Visit: Payer: Self-pay | Admitting: *Deleted

## 2012-04-25 DIAGNOSIS — Z7901 Long term (current) use of anticoagulants: Secondary | ICD-10-CM

## 2012-04-25 DIAGNOSIS — I82409 Acute embolism and thrombosis of unspecified deep veins of unspecified lower extremity: Secondary | ICD-10-CM

## 2012-04-26 ENCOUNTER — Other Ambulatory Visit (HOSPITAL_BASED_OUTPATIENT_CLINIC_OR_DEPARTMENT_OTHER): Payer: BC Managed Care – PPO | Admitting: Lab

## 2012-04-26 ENCOUNTER — Other Ambulatory Visit: Payer: Self-pay | Admitting: Hematology & Oncology

## 2012-04-26 DIAGNOSIS — I82409 Acute embolism and thrombosis of unspecified deep veins of unspecified lower extremity: Secondary | ICD-10-CM

## 2012-04-26 DIAGNOSIS — I2699 Other pulmonary embolism without acute cor pulmonale: Secondary | ICD-10-CM

## 2012-04-26 DIAGNOSIS — Z7901 Long term (current) use of anticoagulants: Secondary | ICD-10-CM

## 2012-04-26 LAB — PROTIME-INR (CHCC SATELLITE)
INR: 4.7 — ABNORMAL HIGH (ref 2.0–3.5)
Protime: 56.4 Seconds — ABNORMAL HIGH (ref 10.6–13.4)

## 2012-04-28 ENCOUNTER — Ambulatory Visit
Admission: RE | Admit: 2012-04-28 | Discharge: 2012-04-28 | Disposition: A | Discharge status: Home / Self Care | Payer: BC Managed Care – PPO | Source: Ambulatory Visit | Attending: Neurology | Admitting: Neurology

## 2012-04-28 DIAGNOSIS — R209 Unspecified disturbances of skin sensation: Secondary | ICD-10-CM

## 2012-04-28 DIAGNOSIS — R269 Unspecified abnormalities of gait and mobility: Secondary | ICD-10-CM

## 2012-04-28 MED ORDER — GADOBENATE DIMEGLUMINE 529 MG/ML IV SOLN
14.0000 mL | Freq: Once | INTRAVENOUS | Status: AC | PRN
  Administered 2012-04-28: 14 mL via INTRAVENOUS

## 2012-05-08 ENCOUNTER — Other Ambulatory Visit (HOSPITAL_BASED_OUTPATIENT_CLINIC_OR_DEPARTMENT_OTHER): Payer: BC Managed Care – PPO | Admitting: Lab

## 2012-05-08 DIAGNOSIS — K7581 Nonalcoholic steatohepatitis (NASH): Secondary | ICD-10-CM

## 2012-05-08 DIAGNOSIS — I82409 Acute embolism and thrombosis of unspecified deep veins of unspecified lower extremity: Secondary | ICD-10-CM

## 2012-05-08 DIAGNOSIS — K7460 Unspecified cirrhosis of liver: Secondary | ICD-10-CM

## 2012-05-08 DIAGNOSIS — I8390 Asymptomatic varicose veins of unspecified lower extremity: Secondary | ICD-10-CM

## 2012-05-08 LAB — PROTIME-INR (CHCC SATELLITE)
INR: 2.1 (ref 2.0–3.5)
Protime: 25.2 Seconds — ABNORMAL HIGH (ref 10.6–13.4)

## 2012-05-11 NOTE — Progress Notes (Signed)
CC:   Frann Rider, M.D.  DIAGNOSIS: 1. Antiphospholipid antibody syndrome with multiple deep venous     thromboses. 2. Fibromyalgia. 3. Nonalcoholic steatohepatitis. 4. 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  CURRENT THERAPY:  Coumadin 7.5 mg p.o. daily.  INTERIM HISTORY:  Ms. Diffee comes in for followup.  She is doing okay. She still has issues with intermittent abdominal pain.  I still do not think there is anything that really can be done about this about this.  She has had no bleeding.  She has had no leg swelling.  She has had no problems with recurrent DVT now, probably for 10 years.  We are trying to monitor her INR closely because of the varices and the risk of bleeding.  She does not want to go onto Xarelto or one of the newer oral anticoagulants.  There has been no change in her medications.  She does have elevated blood sugars but is not on any blood pressure and not on any diabetic medications.  She did have a nice Christmas.  PHYSICAL EXAMINATION:  This is a well-developed, well-nourished white female in no obvious distress.  Vital Signs:  Temperature 97.7, pulse 57, respiratory rate 16, blood pressure 136/59.  Weight is 155. Head/neck:  Normocephalic, atraumatic skull.  There are no ocular or oral lesions.  There are no palpable cervical or supraclavicular lymph nodes.  Lungs:  Clear to percussion and auscultation bilaterally. Cardiac:  Regular rate and rhythm with a normal S1 and S2.  There are no murmurs, rubs or bruits.  Abdomen:  Soft with good bowel sounds.  She has multiple laparotomy scars.  She has had some abdominal tenderness which is chronic.  There is no palpable hepatomegaly.  Extremities:  No clubbing, cyanosis or edema.  She has no venous cord in her legs.  Back: No tenderness over the spine, ribs, or hips.  LABORATORY STUDIES:  A white cell count of 5.4, hemoglobin 15.1, hematocrit 43.3, platelet count 313.  INR is 1.3.  Glucose  was 199.  IMPRESSION:  Ms. Chico is a 67 year old white female with antiphospholipid antibody syndrome.  She has actually quite well from this.  Her main problems were not hematologic by any means.  She has fibromyalgia.  She has the nonalcoholic steatohepatitis (NASH).  She has hyperglycemia.  Her INR is quite low.  I am not sure as to why it is low.  There has been no change in her dosage.  She has had no change in medications.  I told her that this is one of the problems with Coumadin and why one of the newer oral anticoagulants would be a good choice for her.  Again, she wants to stick with the Coumadin.  I will increase her Coumadin dose.  I will then have her come back weekly until we can get her Coumadin dose therapeutic.  I will plan to see her back myself probably in about 3 or 4 weeks.    ______________________________ [name], M.D. PRE/MEDQ  D:  05/10/2012  T:  05/11/2012  Job:  [id number]

## 2012-05-15 ENCOUNTER — Other Ambulatory Visit: Payer: PRIVATE HEALTH INSURANCE | Admitting: Lab

## 2012-05-15 ENCOUNTER — Ambulatory Visit: Payer: PRIVATE HEALTH INSURANCE | Admitting: Hematology & Oncology

## 2012-05-20 ENCOUNTER — Telehealth: Payer: Self-pay | Admitting: *Deleted

## 2012-05-20 NOTE — Telephone Encounter (Signed)
Pt called wanting to

## 2012-05-21 ENCOUNTER — Other Ambulatory Visit (HOSPITAL_BASED_OUTPATIENT_CLINIC_OR_DEPARTMENT_OTHER): Payer: BC Managed Care – PPO | Admitting: Lab

## 2012-05-21 ENCOUNTER — Ambulatory Visit (HOSPITAL_BASED_OUTPATIENT_CLINIC_OR_DEPARTMENT_OTHER): Payer: BC Managed Care – PPO | Admitting: Hematology & Oncology

## 2012-05-21 VITALS — BP 130/59 | HR 60 | Temp 97.8°F | Resp 16 | Ht 63.0 in | Wt 154.0 lb

## 2012-05-21 DIAGNOSIS — I82409 Acute embolism and thrombosis of unspecified deep veins of unspecified lower extremity: Secondary | ICD-10-CM

## 2012-05-21 DIAGNOSIS — K7460 Unspecified cirrhosis of liver: Secondary | ICD-10-CM

## 2012-05-21 DIAGNOSIS — D6859 Other primary thrombophilia: Secondary | ICD-10-CM

## 2012-05-21 LAB — COMPREHENSIVE METABOLIC PANEL
ALT: 26 U/L (ref 0–35)
AST: 29 U/L (ref 0–37)
Albumin: 4.3 g/dL (ref 3.5–5.2)
Alkaline Phosphatase: 105 U/L (ref 39–117)
BUN: 19 mg/dL (ref 6–23)
CO2: 30 mEq/L (ref 19–32)
Calcium: 9.7 mg/dL (ref 8.4–10.5)
Chloride: 103 mEq/L (ref 96–112)
Creatinine, Ser: 0.84 mg/dL (ref 0.50–1.10)
Glucose, Bld: 183 mg/dL — ABNORMAL HIGH (ref 70–99)
Potassium: 4 mEq/L (ref 3.5–5.3)
Sodium: 141 mEq/L (ref 135–145)
Total Bilirubin: 1.5 mg/dL — ABNORMAL HIGH (ref 0.3–1.2)
Total Protein: 6.9 g/dL (ref 6.0–8.3)

## 2012-05-21 LAB — CBC WITH DIFFERENTIAL (CANCER CENTER ONLY)
BASO#: 0.1 10*3/uL (ref 0.0–0.2)
BASO%: 1.2 % (ref 0.0–2.0)
EOS%: 4 % (ref 0.0–7.0)
Eosinophils Absolute: 0.2 10*3/uL (ref 0.0–0.5)
HCT: 45.3 % (ref 34.8–46.6)
HGB: 15.8 g/dL (ref 11.6–15.9)
LYMPH#: 2.1 10*3/uL (ref 0.9–3.3)
LYMPH%: 34 % (ref 14.0–48.0)
MCH: 33.1 pg (ref 26.0–34.0)
MCHC: 34.9 g/dL (ref 32.0–36.0)
MCV: 95 fL (ref 81–101)
MONO#: 0.9 10*3/uL (ref 0.1–0.9)
MONO%: 14 % — ABNORMAL HIGH (ref 0.0–13.0)
NEUT#: 2.8 10*3/uL (ref 1.5–6.5)
NEUT%: 46.8 % (ref 39.6–80.0)
Platelets: 321 10*3/uL (ref 145–400)
RBC: 4.78 10*6/uL (ref 3.70–5.32)
RDW: 12.7 % (ref 11.1–15.7)
WBC: 6.1 10*3/uL (ref 3.9–10.0)

## 2012-05-21 LAB — PROTIME-INR (CHCC SATELLITE)
INR: 2.1 (ref 2.0–3.5)
Protime: 25.2 Seconds — ABNORMAL HIGH (ref 10.6–13.4)

## 2012-05-21 NOTE — Progress Notes (Signed)
This office note has been dictated.

## 2012-05-22 NOTE — Progress Notes (Signed)
CC:   Laurie Green, M.D.  DIAGNOSES: 1. Antiphospholipid antibody syndrome with a history of DVT. 2. Non alcoholic steatohepatitis. 3. Fibromyalgia.  CURRENT THERAPY:  Coumadin 7.5 mg p.o. daily.  INTERIM HISTORY:  Laurie Green comes in for followup.  She is doing better.  Her back seems to be doing a little bit better.  She is going to have the toenail from the 1st toe of her left foot taken off today. Her INR is 2.1.  I think this would be okay.  She has had occasional nausea.  She has had no cough.  She has had no fever.  There has been no bleeding.  She does have varices, thankfully has not been an issue to date.  PHYSICAL EXAMINATION:  This is a well-developed, well-nourished white female in no obvious distress.  Vital signs show a temperature of 97.8, pulse 60, respiratory rate 16, blood pressure 130/59.  Weight is 154. Head and neck shows a normocephalic, atraumatic skull.  There are no ocular or oral lesions.  There are no palpable cervical or supraclavicular lymph nodes.  Lungs are clear bilaterally.  Cardiac exam:  Regular rate and rhythm with a normal S1 and S2.  There are no murmurs, rubs or bruits.  Abdomen:  Soft with good bowel sounds.  She has multiple laparotomy scars.  She has her spleen out.  There is no fluid wave.  There is no guarding or rebound tenderness.  There is some slight tenderness in the right upper quadrant to palpation.  Back:  No tenderness over the spine, ribs, or hips.  Extremities:  Shows no clubbing, cyanosis or edema.  The toenail on the 1st toe of the left foot does appear to be dying.  Neurological:  Shows no focal neurological deficits.  LABORATORY STUDIES:  White cell count 6.1, hemoglobin 16, hematocrit 45, platelet count 320.  INR is 2.1.  IMPRESSION:  Laurie Green is a 67 year old white female with antiphospholipid antibody syndrome.  Thankfully, she has not had a deep vein thrombosis now for probably for close to 11 years.   However, she is on long-term Coumadin.  Again, I do not see a bleeding problem with her having the toenail taken off.  We will monitor her INR every month.  Told that if she were to go on antibiotics after her toenail removal, that we probably need to have her INR checked in a week or so.  I will plan see her back myself in 2 months' time.    ______________________________ [name], M.D. PRE/MEDQ  D:  05/21/2012  T:  05/22/2012  Job:  [id number]

## 2012-06-05 ENCOUNTER — Other Ambulatory Visit: Payer: Self-pay | Admitting: Hematology & Oncology

## 2012-06-12 ENCOUNTER — Telehealth: Payer: Self-pay | Admitting: Hematology & Oncology

## 2012-06-12 NOTE — Telephone Encounter (Signed)
Patient called and cx 06/19/12 apt and resch for 06/14/12

## 2012-06-14 ENCOUNTER — Other Ambulatory Visit (HOSPITAL_BASED_OUTPATIENT_CLINIC_OR_DEPARTMENT_OTHER): Payer: BC Managed Care – PPO | Admitting: Lab

## 2012-06-14 DIAGNOSIS — I82409 Acute embolism and thrombosis of unspecified deep veins of unspecified lower extremity: Secondary | ICD-10-CM

## 2012-06-14 DIAGNOSIS — D6859 Other primary thrombophilia: Secondary | ICD-10-CM

## 2012-06-14 LAB — PROTIME-INR (CHCC SATELLITE)
INR: 1.8 — ABNORMAL LOW (ref 2.0–3.5)
Protime: 21.6 Seconds — ABNORMAL HIGH (ref 10.6–13.4)

## 2012-06-14 LAB — CBC WITH DIFFERENTIAL (CANCER CENTER ONLY)
BASO#: 0.1 10*3/uL (ref 0.0–0.2)
BASO%: 0.7 % (ref 0.0–2.0)
EOS%: 2.7 % (ref 0.0–7.0)
Eosinophils Absolute: 0.2 10*3/uL (ref 0.0–0.5)
HCT: 42.9 % (ref 34.8–46.6)
HGB: 15 g/dL (ref 11.6–15.9)
LYMPH#: 2.3 10*3/uL (ref 0.9–3.3)
LYMPH%: 30.9 % (ref 14.0–48.0)
MCH: 33 pg (ref 26.0–34.0)
MCHC: 35 g/dL (ref 32.0–36.0)
MCV: 95 fL (ref 81–101)
MONO#: 0.8 10*3/uL (ref 0.1–0.9)
MONO%: 11.3 % (ref 0.0–13.0)
NEUT#: 4.1 10*3/uL (ref 1.5–6.5)
NEUT%: 54.4 % (ref 39.6–80.0)
Platelets: 339 10*3/uL (ref 145–400)
RBC: 4.54 10*6/uL (ref 3.70–5.32)
RDW: 12.7 % (ref 11.1–15.7)
WBC: 7.5 10*3/uL (ref 3.9–10.0)

## 2012-06-19 ENCOUNTER — Other Ambulatory Visit: Payer: BC Managed Care – PPO | Admitting: Lab

## 2012-07-19 ENCOUNTER — Other Ambulatory Visit (HOSPITAL_BASED_OUTPATIENT_CLINIC_OR_DEPARTMENT_OTHER): Payer: BC Managed Care – PPO | Admitting: Lab

## 2012-07-19 ENCOUNTER — Ambulatory Visit (HOSPITAL_BASED_OUTPATIENT_CLINIC_OR_DEPARTMENT_OTHER): Payer: BC Managed Care – PPO | Admitting: Hematology & Oncology

## 2012-07-19 VITALS — BP 145/70 | HR 57 | Temp 97.5°F | Resp 16 | Ht 63.0 in | Wt 153.0 lb

## 2012-07-19 DIAGNOSIS — I82403 Acute embolism and thrombosis of unspecified deep veins of lower extremity, bilateral: Secondary | ICD-10-CM

## 2012-07-19 DIAGNOSIS — Z86718 Personal history of other venous thrombosis and embolism: Secondary | ICD-10-CM

## 2012-07-19 DIAGNOSIS — D6859 Other primary thrombophilia: Secondary | ICD-10-CM

## 2012-07-19 DIAGNOSIS — K7689 Other specified diseases of liver: Secondary | ICD-10-CM

## 2012-07-19 DIAGNOSIS — I82409 Acute embolism and thrombosis of unspecified deep veins of unspecified lower extremity: Secondary | ICD-10-CM

## 2012-07-19 DIAGNOSIS — Z7901 Long term (current) use of anticoagulants: Secondary | ICD-10-CM

## 2012-07-19 DIAGNOSIS — K7460 Unspecified cirrhosis of liver: Secondary | ICD-10-CM

## 2012-07-19 LAB — COMPREHENSIVE METABOLIC PANEL
ALT: 24 U/L (ref 0–35)
AST: 24 U/L (ref 0–37)
Albumin: 4.2 g/dL (ref 3.5–5.2)
Alkaline Phosphatase: 86 U/L (ref 39–117)
BUN: 15 mg/dL (ref 6–23)
CO2: 29 mEq/L (ref 19–32)
Calcium: 9.5 mg/dL (ref 8.4–10.5)
Chloride: 103 mEq/L (ref 96–112)
Creatinine, Ser: 0.76 mg/dL (ref 0.50–1.10)
Glucose, Bld: 179 mg/dL — ABNORMAL HIGH (ref 70–99)
Potassium: 4.2 mEq/L (ref 3.5–5.3)
Sodium: 140 mEq/L (ref 135–145)
Total Bilirubin: 1.4 mg/dL — ABNORMAL HIGH (ref 0.3–1.2)
Total Protein: 6.8 g/dL (ref 6.0–8.3)

## 2012-07-19 LAB — CBC WITH DIFFERENTIAL (CANCER CENTER ONLY)
BASO#: 0.1 10*3/uL (ref 0.0–0.2)
BASO%: 1 % (ref 0.0–2.0)
EOS%: 4.9 % (ref 0.0–7.0)
Eosinophils Absolute: 0.2 10*3/uL (ref 0.0–0.5)
HCT: 44.7 % (ref 34.8–46.6)
HGB: 15.7 g/dL (ref 11.6–15.9)
LYMPH#: 1.9 10*3/uL (ref 0.9–3.3)
LYMPH%: 38 % (ref 14.0–48.0)
MCH: 33.3 pg (ref 26.0–34.0)
MCHC: 35.1 g/dL (ref 32.0–36.0)
MCV: 95 fL (ref 81–101)
MONO#: 0.7 10*3/uL (ref 0.1–0.9)
MONO%: 14.9 % — ABNORMAL HIGH (ref 0.0–13.0)
NEUT#: 2 10*3/uL (ref 1.5–6.5)
NEUT%: 41.2 % (ref 39.6–80.0)
Platelets: 297 10*3/uL (ref 145–400)
RBC: 4.72 10*6/uL (ref 3.70–5.32)
RDW: 13.1 % (ref 11.1–15.7)
WBC: 4.9 10*3/uL (ref 3.9–10.0)

## 2012-07-19 LAB — PROTIME-INR (CHCC SATELLITE)
INR: 1.9 — ABNORMAL LOW (ref 2.0–3.5)
Protime: 22.8 Seconds — ABNORMAL HIGH (ref 10.6–13.4)

## 2012-07-19 NOTE — Progress Notes (Signed)
This office note has been dictated.

## 2012-07-19 NOTE — Progress Notes (Signed)
CC:   Laurie Green, M.D.  DIAGNOSES: 1. Antiphospholipid antibody syndrome. 2. 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 3. Nonalcoholic steatohepatitis (NASH). 4. Fibromyalgia.  CURRENT THERAPY:  Coumadin 7.5 mg p.o. daily.  INTERIM HISTORY:  Ms. Bardwell comes in for followup.  We saw her 2 months ago.  She had a tough time having the toenail taken off the first toe of the left foot.  She said she would never have this done again. Thankfully, there were no problems of bleeding with the procedure.  She is bothered by fibromyalgia right now.  This tends to "come and go".  She had some salad this week.  I told her that was okay for her to have salad.  Her INR can tend to fluctuate with what she eats but typically we do not have to make any adjustments with her Coumadin dose.  She has had no nausea or vomiting.  There has been no change in bowel or bladder habits.  She has not noticed any leg swelling.  She has had no cough or shortness of breath.  She has some chronic right upper quadrant discomfort.  PHYSICAL EXAMINATION:  General:  This is a well-developed, well- nourished white female in no obvious distress.  Vital signs:  Show a temperature of 97.5, pulse 57, respiratory rate 16, blood pressure 146/70.  Weight is 153.  Head and neck:  Shows a normocephalic, atraumatic skull.  There are no ocular or oral lesions.  There are no palpable cervical or supraclavicular lymph nodes.  Lungs:  Clear bilaterally.  Cardiac:  Regular rate and rhythm with a normal S1 and S2. There are no murmurs, rubs or bruits.  Abdomen:  Soft.  She has multiple laparotomy scars.  She has a well-healed splenectomy scar.  There is no fluid wave.  There is some slight tenderness in the right upper quadrant.  No hepatomegaly is noted.  Back:  No tenderness over the spine, ribs or hips.  Extremities:  Show no clubbing, cyanosis or edema. She has good range of motion of her joints.   Neurological:  Shows no focal neurological deficits.  LABORATORY STUDIES:  White cell count 4.9, hemoglobin 15.7, hematocrit 44.7, platelet count 297.  INR is 1.9.  IMPRESSION:  Ms. Flannigan is a 67 year old white female with a antiphospholipid antibody syndrome.  She has not had clotting issues since I have been seeing her.  She is on lifelong Coumadin.  We will continue to monitor her INR monthly.  I will plan to see her back myself in another couple months.  As always, we have good fellowship.  She is going to be going to the beach over Easter.  She is looking forward to this.    ______________________________ [name], M.D. PRE/MEDQ  D:  07/19/2012  T:  07/19/2012  Job:  [id number]

## 2012-08-21 ENCOUNTER — Other Ambulatory Visit (HOSPITAL_BASED_OUTPATIENT_CLINIC_OR_DEPARTMENT_OTHER): Payer: BC Managed Care – PPO | Admitting: Lab

## 2012-08-21 DIAGNOSIS — I82403 Acute embolism and thrombosis of unspecified deep veins of lower extremity, bilateral: Secondary | ICD-10-CM

## 2012-08-21 DIAGNOSIS — Z86718 Personal history of other venous thrombosis and embolism: Secondary | ICD-10-CM

## 2012-08-21 DIAGNOSIS — D6859 Other primary thrombophilia: Secondary | ICD-10-CM

## 2012-08-21 DIAGNOSIS — I82409 Acute embolism and thrombosis of unspecified deep veins of unspecified lower extremity: Secondary | ICD-10-CM

## 2012-08-21 LAB — PROTIME-INR (CHCC SATELLITE)
INR: 1.7 — ABNORMAL LOW (ref 2.0–3.5)
Protime: 20.4 Seconds — ABNORMAL HIGH (ref 10.6–13.4)

## 2012-08-21 LAB — CBC WITH DIFFERENTIAL (CANCER CENTER ONLY)
BASO#: 0.1 10*3/uL (ref 0.0–0.2)
BASO%: 0.9 % (ref 0.0–2.0)
EOS%: 3.8 % (ref 0.0–7.0)
Eosinophils Absolute: 0.2 10*3/uL (ref 0.0–0.5)
HCT: 41.9 % (ref 34.8–46.6)
HGB: 14.7 g/dL (ref 11.6–15.9)
LYMPH#: 2.2 10*3/uL (ref 0.9–3.3)
LYMPH%: 38.7 % (ref 14.0–48.0)
MCH: 33.6 pg (ref 26.0–34.0)
MCHC: 35.1 g/dL (ref 32.0–36.0)
MCV: 96 fL (ref 81–101)
MONO#: 0.8 10*3/uL (ref 0.1–0.9)
MONO%: 13.8 % — ABNORMAL HIGH (ref 0.0–13.0)
NEUT#: 2.5 10*3/uL (ref 1.5–6.5)
NEUT%: 42.8 % (ref 39.6–80.0)
Platelets: 309 10*3/uL (ref 145–400)
RBC: 4.37 10*6/uL (ref 3.70–5.32)
RDW: 13.1 % (ref 11.1–15.7)
WBC: 5.7 10*3/uL (ref 3.9–10.0)

## 2012-08-22 LAB — D-DIMER, QUANTITATIVE (NOT AT ARMC): D-Dimer, Quant: 0.46 ug/mL-FEU (ref 0.00–0.48)

## 2012-08-22 LAB — COMPREHENSIVE METABOLIC PANEL
ALT: 23 U/L (ref 0–35)
AST: 25 U/L (ref 0–37)
Albumin: 3.9 g/dL (ref 3.5–5.2)
Alkaline Phosphatase: 79 U/L (ref 39–117)
BUN: 18 mg/dL (ref 6–23)
CO2: 24 mEq/L (ref 19–32)
Calcium: 9.4 mg/dL (ref 8.4–10.5)
Chloride: 104 mEq/L (ref 96–112)
Creatinine, Ser: 0.74 mg/dL (ref 0.50–1.10)
Glucose, Bld: 143 mg/dL — ABNORMAL HIGH (ref 70–99)
Potassium: 4 mEq/L (ref 3.5–5.3)
Sodium: 143 mEq/L (ref 135–145)
Total Bilirubin: 1.2 mg/dL (ref 0.3–1.2)
Total Protein: 6.4 g/dL (ref 6.0–8.3)

## 2012-08-29 ENCOUNTER — Telehealth: Payer: Self-pay | Admitting: Hematology & Oncology

## 2012-08-29 NOTE — Telephone Encounter (Signed)
Fax Medical Records through Epic today to: [facility]       [address] [place] [place], [place]  [address]       Ph: [phone]       Fx: [phone]       Email: [email or web address]         Medical  Records request for all Vitamen D levels for the past year. We only had 2012.   CONSENT COPY SCANNED

## 2012-09-03 ENCOUNTER — Encounter (HOSPITAL_BASED_OUTPATIENT_CLINIC_OR_DEPARTMENT_OTHER): Payer: Self-pay

## 2012-09-03 ENCOUNTER — Emergency Department (HOSPITAL_BASED_OUTPATIENT_CLINIC_OR_DEPARTMENT_OTHER): Payer: BC Managed Care – PPO

## 2012-09-03 ENCOUNTER — Inpatient Hospital Stay (HOSPITAL_BASED_OUTPATIENT_CLINIC_OR_DEPARTMENT_OTHER)
Admission: EM | Admit: 2012-09-03 | Discharge: 2012-09-07 | DRG: 557 | Disposition: A | Discharge status: Home / Self Care | Payer: BC Managed Care – PPO | Attending: Internal Medicine | Admitting: Internal Medicine

## 2012-09-03 DIAGNOSIS — Z9089 Acquired absence of other organs: Secondary | ICD-10-CM

## 2012-09-03 DIAGNOSIS — R7402 Elevation of levels of lactic acid dehydrogenase (LDH): Secondary | ICD-10-CM | POA: Diagnosis present

## 2012-09-03 DIAGNOSIS — K7581 Nonalcoholic steatohepatitis (NASH): Secondary | ICD-10-CM | POA: Diagnosis present

## 2012-09-03 DIAGNOSIS — R791 Abnormal coagulation profile: Secondary | ICD-10-CM | POA: Diagnosis present

## 2012-09-03 DIAGNOSIS — J45909 Unspecified asthma, uncomplicated: Secondary | ICD-10-CM | POA: Diagnosis present

## 2012-09-03 DIAGNOSIS — I8510 Secondary esophageal varices without bleeding: Secondary | ICD-10-CM | POA: Diagnosis present

## 2012-09-03 DIAGNOSIS — Z794 Long term (current) use of insulin: Secondary | ICD-10-CM

## 2012-09-03 DIAGNOSIS — M129 Arthropathy, unspecified: Secondary | ICD-10-CM | POA: Diagnosis present

## 2012-09-03 DIAGNOSIS — I820 Budd-Chiari syndrome: Secondary | ICD-10-CM | POA: Diagnosis present

## 2012-09-03 DIAGNOSIS — K7689 Other specified diseases of liver: Principal | ICD-10-CM | POA: Diagnosis present

## 2012-09-03 DIAGNOSIS — K900 Celiac disease: Secondary | ICD-10-CM | POA: Diagnosis present

## 2012-09-03 DIAGNOSIS — K432 Incisional hernia without obstruction or gangrene: Secondary | ICD-10-CM | POA: Diagnosis present

## 2012-09-03 DIAGNOSIS — IMO0001 Reserved for inherently not codable concepts without codable children: Secondary | ICD-10-CM | POA: Diagnosis present

## 2012-09-03 DIAGNOSIS — D6859 Other primary thrombophilia: Secondary | ICD-10-CM | POA: Diagnosis present

## 2012-09-03 DIAGNOSIS — E119 Type 2 diabetes mellitus without complications: Secondary | ICD-10-CM | POA: Diagnosis present

## 2012-09-03 DIAGNOSIS — R52 Pain, unspecified: Secondary | ICD-10-CM | POA: Diagnosis present

## 2012-09-03 DIAGNOSIS — R109 Unspecified abdominal pain: Secondary | ICD-10-CM | POA: Diagnosis present

## 2012-09-03 DIAGNOSIS — R509 Fever, unspecified: Secondary | ICD-10-CM | POA: Diagnosis present

## 2012-09-03 DIAGNOSIS — K219 Gastro-esophageal reflux disease without esophagitis: Secondary | ICD-10-CM | POA: Diagnosis present

## 2012-09-03 DIAGNOSIS — R7401 Elevation of levels of liver transaminase levels: Secondary | ICD-10-CM | POA: Diagnosis present

## 2012-09-03 DIAGNOSIS — D72829 Elevated white blood cell count, unspecified: Secondary | ICD-10-CM | POA: Diagnosis present

## 2012-09-03 DIAGNOSIS — Z8673 Personal history of transient ischemic attack (TIA), and cerebral infarction without residual deficits: Secondary | ICD-10-CM

## 2012-09-03 DIAGNOSIS — I8500 Esophageal varices without bleeding: Secondary | ICD-10-CM | POA: Diagnosis present

## 2012-09-03 DIAGNOSIS — Z7901 Long term (current) use of anticoagulants: Secondary | ICD-10-CM

## 2012-09-03 LAB — COMPREHENSIVE METABOLIC PANEL
ALT: 447 U/L — ABNORMAL HIGH (ref 0–35)
AST: 631 U/L — ABNORMAL HIGH (ref 0–37)
Albumin: 3.7 g/dL (ref 3.5–5.2)
Alkaline Phosphatase: 145 U/L — ABNORMAL HIGH (ref 39–117)
BUN: 21 mg/dL (ref 6–23)
CO2: 27 mEq/L (ref 19–32)
Calcium: 9.3 mg/dL (ref 8.4–10.5)
Chloride: 98 mEq/L (ref 96–112)
Creatinine, Ser: 0.7 mg/dL (ref 0.50–1.10)
GFR calc Af Amer: >90 mL/min (ref >90)
GFR calc non Af Amer: 90 mL/min — ABNORMAL LOW (ref >90)
Glucose, Bld: 161 mg/dL — ABNORMAL HIGH (ref 70–99)
Potassium: 3.7 mEq/L (ref 3.5–5.1)
Sodium: 137 mEq/L (ref 135–145)
Total Bilirubin: 3.3 mg/dL — ABNORMAL HIGH (ref 0.3–1.2)
Total Protein: 7.1 g/dL (ref 6.0–8.3)

## 2012-09-03 LAB — URINALYSIS, ROUTINE W REFLEX MICROSCOPIC
Glucose, UA: 250 mg/dL — AB
Hgb urine dipstick: NEGATIVE
Ketones, ur: 15 mg/dL — AB
Nitrite: NEGATIVE
Protein, ur: NEGATIVE mg/dL
Specific Gravity, Urine: 1.028 (ref 1.005–1.030)
Urobilinogen, UA: 1 mg/dL (ref 0.0–1.0)
pH: 6 (ref 5.0–8.0)

## 2012-09-03 LAB — CBC WITH DIFFERENTIAL/PLATELET
Basophils Absolute: 0 10*3/uL (ref 0.0–0.1)
Basophils Relative: 0 % (ref 0–1)
Eosinophils Absolute: 0 10*3/uL (ref 0.0–0.7)
Eosinophils Relative: 0 % (ref 0–5)
HCT: 42.8 % (ref 36.0–46.0)
Hemoglobin: 15.4 g/dL — ABNORMAL HIGH (ref 12.0–15.0)
Lymphocytes Relative: 8 % — ABNORMAL LOW (ref 12–46)
Lymphs Abs: 1 10*3/uL (ref 0.7–4.0)
MCH: 33.7 pg (ref 26.0–34.0)
MCHC: 36 g/dL (ref 30.0–36.0)
MCV: 93.7 fL (ref 78.0–100.0)
Monocytes Absolute: 0.9 10*3/uL (ref 0.1–1.0)
Monocytes Relative: 8 % (ref 3–12)
Neutro Abs: 10.6 10*3/uL — ABNORMAL HIGH (ref 1.7–7.7)
Neutrophils Relative %: 84 % — ABNORMAL HIGH (ref 43–77)
Platelets: 315 10*3/uL (ref 150–400)
RBC: 4.57 MIL/uL (ref 3.87–5.11)
RDW: 13 % (ref 11.5–15.5)
WBC: 12.6 10*3/uL — ABNORMAL HIGH (ref 4.0–10.5)

## 2012-09-03 LAB — URINE MICROSCOPIC-ADD ON

## 2012-09-03 LAB — LIPASE, BLOOD: Lipase: 22 U/L (ref 11–59)

## 2012-09-03 LAB — LACTIC ACID, PLASMA: Lactic Acid, Venous: 1.2 mmol/L (ref 0.5–2.2)

## 2012-09-03 LAB — GLUCOSE, CAPILLARY: Glucose-Capillary: 166 mg/dL — ABNORMAL HIGH (ref 70–99)

## 2012-09-03 LAB — PROTIME-INR
INR: 1.59 — ABNORMAL HIGH (ref 0.00–1.49)
Prothrombin Time: 18.5 seconds — ABNORMAL HIGH (ref 11.6–15.2)

## 2012-09-03 MED ORDER — SODIUM CHLORIDE 0.9 % IV BOLUS (SEPSIS)
1000.0000 mL | Freq: Once | INTRAVENOUS | Status: AC
  Administered 2012-09-03: 1000 mL via INTRAVENOUS

## 2012-09-03 MED ORDER — SODIUM CHLORIDE 0.9 % IV SOLN
INTRAVENOUS | Status: DC
  Administered 2012-09-03: 125 mL/h via INTRAVENOUS
  Administered 2012-09-06: 02:00:00 via INTRAVENOUS

## 2012-09-03 MED ORDER — HYDROMORPHONE HCL PF 1 MG/ML IJ SOLN
1.0000 mg | Freq: Once | INTRAMUSCULAR | Status: AC
  Administered 2012-09-03: 1 mg via INTRAVENOUS
  Filled 2012-09-03: qty 1

## 2012-09-03 MED ORDER — SODIUM CHLORIDE 0.9 % IV SOLN
3.0000 g | Freq: Once | INTRAVENOUS | Status: AC
  Administered 2012-09-04: 3 g via INTRAVENOUS
  Filled 2012-09-03: qty 3

## 2012-09-03 MED ORDER — ONDANSETRON HCL 4 MG/2ML IJ SOLN
4.0000 mg | Freq: Once | INTRAMUSCULAR | Status: AC
  Administered 2012-09-03: 4 mg via INTRAVENOUS
  Filled 2012-09-03: qty 2

## 2012-09-03 MED ORDER — IOHEXOL 300 MG/ML  SOLN
100.0000 mL | Freq: Once | INTRAMUSCULAR | Status: AC | PRN
  Administered 2012-09-03: 100 mL via INTRAVENOUS

## 2012-09-03 MED ORDER — IOHEXOL 300 MG/ML  SOLN
50.0000 mL | Freq: Once | INTRAMUSCULAR | Status: AC | PRN
  Administered 2012-09-03: 50 mL via ORAL

## 2012-09-03 NOTE — ED Notes (Signed)
Pt reports that she has ruq pain for several days, pt reports severe nausea, but no vomiting, tenderness ruq that radiates to right groin, multiple complaints, states that she has been bitten by 5 ticks, last being 2 weeks ago

## 2012-09-03 NOTE — ED Notes (Signed)
C/o RUQ "for months"-states she came to ED today for fever, increase in pain, nausea

## 2012-09-03 NOTE — ED Provider Notes (Signed)
History     CSN: [id number]  Arrival date & time 09/03/12  1942   First MD Initiated Contact with Patient 09/03/12 2132      Chief Complaint  Patient presents with  . Abdominal Pain    (Consider location/radiation/quality/duration/timing/severity/associated sxs/prior treatment) HPI Complains of abdominal pain right-sided for approximately one month, worse over the past 2 days accompanied by nausea. The patient also reports several episodes of diarrhea yesterday to have a soft bowel movement today. She developed a fever of 101.8 at approximately noon. She treated self with Tylenol at 2 PM today. Minimal relief. Abdominal pain is moderate to severe. Not made better or worse by anything. No other associated symptoms. No cough no shortness of breath no chest pain Past Medical History  Diagnosis Date  . TIA (transient ischemic attack)   . Hypertension   . Anti-cardiolipin antibody syndrome     antiphospholipid antibody syndrome  . Diverticulitis   . Portal hypertension   . Varices, gastric   . Varices, esophageal   . Fibromyalgia   . Celiac disease   . Diabetes mellitus   . PONV (postoperative nausea and vomiting)   . Heart murmur   . Asthma   . Blood dyscrasia     anticardiolipid syndrome  . Stroke     tia  . Blood transfusion   . GERD (gastroesophageal reflux disease)   . Arthritis   . DVT (deep venous thrombosis) 05/01/2011  . Varices 05/01/2011  . NASH (nonalcoholic steatohepatitis) 05/01/2011   diverticulitis  Past Surgical History  Procedure Laterality Date  . Colon surgery    . Abdominal hysterectomy    . Splenectomy, total    . Cholecystectomy    . Hernia repair      luq incisional  . Ankle fracture surgery    . Esophagogastroduodenoscopy  04/28/2011    Procedure: ESOPHAGOGASTRODUODENOSCOPY (EGD);  Surgeon: [name]., MD;  Location[facility] ENDOSCOPY;  Service: Endoscopy;  Laterality: N/A;   partial colectomy  No family history on file.  History   Substance Use Topics  . Smoking status: Never Smoker   . Smokeless tobacco: Not on file  . Alcohol Use: No    OB History   Grav Para Term Preterm Abortions TAB SAB Ect Mult Living                  Review of Systems  Constitutional: Positive for fever.  HENT: Negative.   Respiratory: Negative.   Cardiovascular: Negative.   Gastrointestinal: Positive for nausea, abdominal pain and diarrhea.  Musculoskeletal: Negative.   Skin: Negative.   Allergic/Immunologic: Positive for immunocompromised state.  Neurological: Negative.   Psychiatric/Behavioral: Negative.   All other systems reviewed and are negative.    Allergies  Caine-1; Codeine; Dilaudid; Morphine and related; Percocet; Tramadol; and Vicodin  Home Medications   Current Outpatient Rx  Name  Route  Sig  Dispense  Refill  . metFORMIN (GLUCOPHAGE) 500 MG tablet   Oral   Take 500 mg by mouth daily.         Marland Kitchen azelastine (ASTELIN) 137 MCG/SPRAY nasal spray   Nasal   Place 1 spray into the nose as needed.          . benazepril (LOTENSIN) 20 MG tablet   Oral   Take 20 mg by mouth daily.           Marland Kitchen COUMADIN 7.5 MG tablet      TAKE 1 TABLET BY MOUTH EVERY DAY  30 tablet   3   . DIGESTIVE ENZYMES PO   Oral   Take 1 capsule by mouth daily. Astaxanthin         . Ergocalciferol (VITAMIN D2) 2000 UNITS TABS   Oral   Take 1 tablet by mouth daily.           Marland Kitchen omeprazole (PRILOSEC OTC) 20 MG tablet   Oral   Take 20 mg by mouth as needed.          . propranolol (INDERAL) 80 MG tablet   Oral   Take 80 mg by mouth daily.             BP 122/54  Pulse 72  Temp(Src) 100.2 F (37.9 C) (Oral)  Resp 20  Ht 5' 4"  (1.626 m)  Wt 149 lb (67.586 kg)  BMI 25.56 kg/m2  SpO2 94%  Physical Exam  Nursing note and vitals reviewed. Constitutional: She appears well-developed and well-nourished.  HENT:  Head: Normocephalic and atraumatic.  Eyes: Conjunctivae are normal. Pupils are equal, round, and  reactive to light.  Neck: Neck supple. No tracheal deviation present. No thyromegaly present.  Cardiovascular: Normal rate and regular rhythm.   No murmur heard. Pulmonary/Chest: Effort normal and breath sounds normal.  Abdominal: Soft. Bowel sounds are normal. She exhibits no distension. There is tenderness.  Multiple surgical scars. Tender at right upper quadrant and right lower quadrant  Musculoskeletal: Normal range of motion. She exhibits no edema and no tenderness.  Neurological: She is alert. Coordination normal.  Skin: Skin is warm and dry. No rash noted.  Psychiatric: She has a normal mood and affect.    ED Course  Procedures (including critical care time)  Labs Reviewed  URINALYSIS, ROUTINE W REFLEX MICROSCOPIC - Abnormal; Notable for the following:    Color, Urine AMBER (*)    Glucose, UA 250 (*)    Bilirubin Urine SMALL (*)    Ketones, ur 15 (*)    Leukocytes, UA SMALL (*)    All other components within normal limits  URINE MICROSCOPIC-ADD ON   No results found.  Results for orders placed during the hospital encounter of 09/03/12  URINALYSIS, ROUTINE W REFLEX MICROSCOPIC      Result Value Range   Color, Urine AMBER (*) YELLOW   APPearance CLEAR  CLEAR   Specific Gravity, Urine 1.028  1.005 - 1.030   pH 6.0  5.0 - 8.0   Glucose, UA 250 (*) NEGATIVE mg/dL   Hgb urine dipstick NEGATIVE  NEGATIVE   Bilirubin Urine SMALL (*) NEGATIVE   Ketones, ur 15 (*) NEGATIVE mg/dL   Protein, ur NEGATIVE  NEGATIVE mg/dL   Urobilinogen, UA 1.0  0.0 - 1.0 mg/dL   Nitrite NEGATIVE  NEGATIVE   Leukocytes, UA SMALL (*) NEGATIVE  URINE MICROSCOPIC-ADD ON      Result Value Range   Squamous Epithelial / LPF RARE  RARE   WBC, UA 3-6  <3 WBC/hpf   Bacteria, UA RARE  RARE   Urine-Other MUCOUS PRESENT    GLUCOSE, CAPILLARY      Result Value Range   Glucose-Capillary 166 (*) 70 - 99 mg/dL  COMPREHENSIVE METABOLIC PANEL      Result Value Range   Sodium 137  135 - 145 mEq/L    Potassium 3.7  3.5 - 5.1 mEq/L   Chloride 98  96 - 112 mEq/L   CO2 27  19 - 32 mEq/L   Glucose, Bld 161 (*) 70 - 99  mg/dL   BUN 21  6 - 23 mg/dL   Creatinine, Ser 0.70  0.50 - 1.10 mg/dL   Calcium 9.3  8.4 - 10.5 mg/dL   Total Protein 7.1  6.0 - 8.3 g/dL   Albumin 3.7  3.5 - 5.2 g/dL   AST 631 (*) 0 - 37 U/L   ALT 447 (*) 0 - 35 U/L   Alkaline Phosphatase 145 (*) 39 - 117 U/L   Total Bilirubin 3.3 (*) 0.3 - 1.2 mg/dL   GFR calc non Af Amer 90 (*) >90 mL/min   GFR calc Af Amer >90  >90 mL/min  CBC WITH DIFFERENTIAL      Result Value Range   WBC 12.6 (*) 4.0 - 10.5 K/uL   RBC 4.57  3.87 - 5.11 MIL/uL   Hemoglobin 15.4 (*) 12.0 - 15.0 g/dL   HCT 42.8  36.0 - 46.0 %   MCV 93.7  78.0 - 100.0 fL   MCH 33.7  26.0 - 34.0 pg   MCHC 36.0  30.0 - 36.0 g/dL   RDW 13.0  11.5 - 15.5 %   Platelets 315  150 - 400 K/uL   Neutrophils Relative % 84 (*) 43 - 77 %   Neutro Abs 10.6 (*) 1.7 - 7.7 K/uL   Lymphocytes Relative 8 (*) 12 - 46 %   Lymphs Abs 1.0  0.7 - 4.0 K/uL   Monocytes Relative 8  3 - 12 %   Monocytes Absolute 0.9  0.1 - 1.0 K/uL   Eosinophils Relative 0  0 - 5 %   Eosinophils Absolute 0.0  0.0 - 0.7 K/uL   Basophils Relative 0  0 - 1 %   Basophils Absolute 0.0  0.0 - 0.1 K/uL  LIPASE, BLOOD      Result Value Range   Lipase 22  11 - 59 U/L  PROTIME-INR      Result Value Range   Prothrombin Time 18.5 (*) 11.6 - 15.2 seconds   INR 1.59 (*) 0.00 - 1.49  LACTIC ACID, PLASMA      Result Value Range   Lactic Acid, Venous 1.2  0.5 - 2.2 mmol/L   Ct Abdomen Pelvis W Contrast  09/03/2012   *RADIOLOGY REPORT*  Clinical Data: Right-sided abdominal pain, nausea and diarrhea.  CT ABDOMEN AND PELVIS WITH CONTRAST  Technique:  Multidetector CT imaging of the abdomen and pelvis was performed following the standard protocol during bolus administration of intravenous contrast.  Contrast: 73m OMNIPAQUE IOHEXOL 300 MG/ML  SOLN, 1026mOMNIPAQUE IOHEXOL 300 MG/ML  SOLN  Comparison: 02/16/2011.   Findings: The lung bases are clear except for dependent bibasilar atelectasis.  There are massive esophageal varices noted.  Stable cirrhotic changes involving the liver with cavernous transformation of the portal vein and massive portal venous collaterals.  The spleen is surgically absent.  Stable biliary air likely due to prior sphincterotomy.  The gallbladder is surgically absent.  The pancreas is unremarkable.  The adrenal glands and kidneys are normal and stable.  No focal hepatic lesion.  Mild stable biliary dilatation.  Stable scattered mesenteric and retroperitoneal lymph nodes but no mass or adenopathy.  The stomach, duodenum, small bowel and colon are grossly normal. The appendix is normal.  No inflammatory changes or mass lesions involving the bowel.  The aorta is normal in caliber.  The major branch vessels are patent.  Stable far left lateral anterior abdominal wall hernia containing fat.  The uterus is surgically absent.  The bladder  is normal.  No pelvic mass or adenopathy.  No significant free pelvic fluid collections. No inguinal mass or hernia.  The bony structures are intact.  IMPRESSION:  1.  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 2.  Status post splenectomy and cholecystectomy. 3.  Stable mild biliary dilatation and biliary air. 4.  No acute abdominal/pelvic findings, mass lesions or lymphadenopathy.   Original Report Authenticated By: Marijo Sanes, M.D.    No diagnosis found.  11:55 PM patient resting comfortably after treatment with intravenous hydromorphone,, Zofran and intravenous fluids  MDM  Note patient is not allergic to hydromorphone, her reaction is nausea. Case discussed with Dr.Ramirez who defers to medical service Case discussed with Dr. Cathren Laine who will accept patient in transfer to [facility] [place] Plan intravenous fluids, antibiotics blood cultures urine culture pending,  pain control Diagnosis #1 abdominal pain  Differential diagnosis includes common duct stone versus acute hepatitis #2 fever #3 hyperbilirubinemia #4 hyperglycemia #5 subtherapeutic INR         Orlie Dakin, MD 09/04/12 0002

## 2012-09-04 ENCOUNTER — Encounter (HOSPITAL_COMMUNITY): Payer: Self-pay | Admitting: *Deleted

## 2012-09-04 DIAGNOSIS — E119 Type 2 diabetes mellitus without complications: Secondary | ICD-10-CM

## 2012-09-04 DIAGNOSIS — R509 Fever, unspecified: Secondary | ICD-10-CM

## 2012-09-04 DIAGNOSIS — R7401 Elevation of levels of liver transaminase levels: Secondary | ICD-10-CM

## 2012-09-04 DIAGNOSIS — R109 Unspecified abdominal pain: Secondary | ICD-10-CM | POA: Diagnosis present

## 2012-09-04 DIAGNOSIS — R740 Nonspecific elevation of levels of transaminase and lactic acid dehydrogenase [LDH]: Secondary | ICD-10-CM

## 2012-09-04 DIAGNOSIS — R791 Abnormal coagulation profile: Secondary | ICD-10-CM | POA: Diagnosis present

## 2012-09-04 DIAGNOSIS — I8500 Esophageal varices without bleeding: Secondary | ICD-10-CM | POA: Diagnosis present

## 2012-09-04 DIAGNOSIS — D72829 Elevated white blood cell count, unspecified: Secondary | ICD-10-CM

## 2012-09-04 LAB — RAPID URINE DRUG SCREEN, HOSP PERFORMED
Amphetamines: NOT DETECTED
Barbiturates: NOT DETECTED
Benzodiazepines: NOT DETECTED
Cocaine: NOT DETECTED
Opiates: NOT DETECTED
Tetrahydrocannabinol: NOT DETECTED

## 2012-09-04 LAB — GLUCOSE, CAPILLARY
Glucose-Capillary: 100 mg/dL — ABNORMAL HIGH (ref 70–99)
Glucose-Capillary: 122 mg/dL — ABNORMAL HIGH (ref 70–99)
Glucose-Capillary: 141 mg/dL — ABNORMAL HIGH (ref 70–99)
Glucose-Capillary: 154 mg/dL — ABNORMAL HIGH (ref 70–99)
Glucose-Capillary: 157 mg/dL — ABNORMAL HIGH (ref 70–99)
Glucose-Capillary: 183 mg/dL — ABNORMAL HIGH (ref 70–99)

## 2012-09-04 LAB — HEMOGLOBIN A1C
Hgb A1c MFr Bld: 6.9 % — ABNORMAL HIGH (ref <5.7)
Mean Plasma Glucose: 151 mg/dL — ABNORMAL HIGH (ref <117)

## 2012-09-04 MED ORDER — INSULIN ASPART 100 UNIT/ML ~~LOC~~ SOLN
0.0000 [IU] | SUBCUTANEOUS | Status: DC
  Administered 2012-09-04 (×3): 2 [IU] via SUBCUTANEOUS
  Administered 2012-09-04 – 2012-09-05 (×2): 1 [IU] via SUBCUTANEOUS
  Administered 2012-09-05: 2 [IU] via SUBCUTANEOUS
  Administered 2012-09-05 (×2): 1 [IU] via SUBCUTANEOUS
  Administered 2012-09-05: 2 [IU] via SUBCUTANEOUS
  Administered 2012-09-06: 1 [IU] via SUBCUTANEOUS
  Administered 2012-09-06: 2 [IU] via SUBCUTANEOUS
  Administered 2012-09-06 – 2012-09-07 (×3): 1 [IU] via SUBCUTANEOUS
  Administered 2012-09-07: 2 [IU] via SUBCUTANEOUS

## 2012-09-04 MED ORDER — PIPERACILLIN-TAZOBACTAM 3.375 G IVPB
3.3750 g | Freq: Three times a day (TID) | INTRAVENOUS | Status: DC
  Administered 2012-09-04 – 2012-09-07 (×11): 3.375 g via INTRAVENOUS
  Filled 2012-09-04 (×14): qty 50

## 2012-09-04 MED ORDER — SODIUM CHLORIDE 0.9 % IV SOLN: INTRAVENOUS | Status: AC

## 2012-09-04 MED ORDER — ONDANSETRON HCL 4 MG/2ML IJ SOLN
INTRAMUSCULAR | Status: AC
  Filled 2012-09-04: qty 2

## 2012-09-04 MED ORDER — WARFARIN SODIUM 10 MG PO TABS
10.0000 mg | ORAL_TABLET | Freq: Once | ORAL | Status: AC
  Administered 2012-09-04: 10 mg via ORAL
  Filled 2012-09-04: qty 1

## 2012-09-04 MED ORDER — FENTANYL CITRATE 0.05 MG/ML IJ SOLN: 25.0000 ug | INTRAMUSCULAR | Status: DC | PRN

## 2012-09-04 MED ORDER — ACETAMINOPHEN 650 MG RE SUPP: 650.0000 mg | Freq: Four times a day (QID) | RECTAL | Status: DC | PRN

## 2012-09-04 MED ORDER — ALUM & MAG HYDROXIDE-SIMETH 200-200-20 MG/5ML PO SUSP: 30.0000 mL | Freq: Four times a day (QID) | ORAL | Status: DC | PRN

## 2012-09-04 MED ORDER — ACETAMINOPHEN 325 MG PO TABS
650.0000 mg | ORAL_TABLET | Freq: Four times a day (QID) | ORAL | Status: DC | PRN
  Administered 2012-09-05 – 2012-09-06 (×2): 650 mg via ORAL
  Filled 2012-09-04 (×2): qty 2

## 2012-09-04 MED ORDER — ONDANSETRON HCL 4 MG/2ML IJ SOLN
4.0000 mg | Freq: Four times a day (QID) | INTRAMUSCULAR | Status: DC | PRN
  Administered 2012-09-04: 4 mg via INTRAVENOUS

## 2012-09-04 MED ORDER — SODIUM CHLORIDE 0.9 % IV SOLN
INTRAVENOUS | Status: DC
  Administered 2012-09-04 (×2): via INTRAVENOUS
  Administered 2012-09-05: 1000 mL via INTRAVENOUS

## 2012-09-04 MED ORDER — ONDANSETRON HCL 4 MG PO TABS: 4.0000 mg | ORAL_TABLET | Freq: Four times a day (QID) | ORAL | Status: DC | PRN

## 2012-09-04 MED ORDER — VANCOMYCIN HCL IN DEXTROSE 1-5 GM/200ML-% IV SOLN
1000.0000 mg | Freq: Two times a day (BID) | INTRAVENOUS | Status: DC
  Administered 2012-09-04 – 2012-09-07 (×7): 1000 mg via INTRAVENOUS
  Filled 2012-09-04 (×8): qty 200

## 2012-09-04 MED ORDER — WARFARIN - PHARMACIST DOSING INPATIENT
Freq: Every day | Status: DC
  Administered 2012-09-05: 18:00:00

## 2012-09-04 MED ORDER — ZOLPIDEM TARTRATE 5 MG PO TABS: 5.0000 mg | ORAL_TABLET | Freq: Every evening | ORAL | Status: DC | PRN

## 2012-09-04 MED ORDER — DIPHENHYDRAMINE HCL 50 MG/ML IJ SOLN
25.0000 mg | Freq: Four times a day (QID) | INTRAMUSCULAR | Status: DC | PRN
  Administered 2012-09-04 – 2012-09-07 (×6): 25 mg via INTRAVENOUS
  Filled 2012-09-04 (×6): qty 1

## 2012-09-04 NOTE — H&P (Addendum)
[facility] History and Physical  Laurie Green EFE:[id number] DOB: 08-Jun-1945 DOA: 09/03/2012  Referring physician:   EDP PCP: Marjorie Smolder, MD  Specialists:   Chief Complaint: ABD Pain  HPI: Laurie Green is a 67 y.o. female who presented to the [facility] ED with complaints of severe 8/10 right sided ABD Pain that has been occurring X 1 month but worsened over the past 2 days.   She reports having fever to 101.8 1 day ago, along with nausea and dry heaves.   She reports also having loose stools.    She denies having hematemesis ,melena and hematochezia.    She was evaluated in the [facility] ED and had a CT scan performed of the ABD and pelvis which revealed her chronic conditions but did not explain her acute symptoms.  She was referred for admission.         Review of Systems: The patient denies anorexia, weight loss, vision loss, decreased hearing, hoarseness, chest pain, syncope, dyspnea on exertion, peripheral edema, balance deficits, hemoptysis, melena, hematochezia, severe indigestion/heartburn, hematuria, incontinence, genital sores, muscle weakness, suspicious skin lesions, transient blindness, difficulty walking, depression, unusual weight change, abnormal bleeding, enlarged lymph nodes, angioedema, and breast masses.    Past Medical History  Diagnosis Date  . TIA (transient ischemic attack)   . Hypertension   . Anti-cardiolipin antibody syndrome     antiphospholipid antibody syndrome  . Diverticulitis   . Portal hypertension   . Varices, gastric   . Varices, esophageal   . Fibromyalgia   . Celiac disease   . Diabetes mellitus   . PONV (postoperative nausea and vomiting)   . Heart murmur   . Asthma   . Blood dyscrasia     anticardiolipid syndrome  . Stroke     tia  . Blood transfusion   . GERD (gastroesophageal reflux disease)   . Arthritis   . DVT (deep venous thrombosis) 05/01/2011  . Varices 05/01/2011  . NASH (nonalcoholic steatohepatitis) 05/01/2011   Past  Surgical History  Procedure Laterality Date  . Colon surgery    . Abdominal hysterectomy    . Splenectomy, total    . Cholecystectomy    . Hernia repair      luq incisional  . Ankle fracture surgery    . Esophagogastroduodenoscopy  04/28/2011    Procedure: ESOPHAGOGASTRODUODENOSCOPY (EGD);  Surgeon: [name]., MD;  Location: [facility] ENDOSCOPY;  Service: Endoscopy;  Laterality: N/A;      Medications:  HOME MEDS: Prior to Admission medications   Medication Sig Start Date End Date Taking? Authorizing Provider  azelastine (ASTELIN) 137 MCG/SPRAY nasal spray Place 1 spray into the nose as needed.  07/13/11 11/18/12 Yes [name], MD  benazepril (LOTENSIN) 20 MG tablet Take 20 mg by mouth daily.     Yes Historical Provider, MD  metFORMIN (GLUCOPHAGE) 500 MG tablet Take 500 mg by mouth daily.   Yes Historical Provider, MD  propranolol (INDERAL) 80 MG tablet Take 80 mg by mouth daily.     Yes Historical Provider, MD  COUMADIN 7.5 MG tablet TAKE 1 TABLET BY MOUTH EVERY DAY 06/05/12   [name], MD  DIGESTIVE ENZYMES PO Take 1 capsule by mouth daily. Astaxanthin    Historical Provider, MD  Ergocalciferol (VITAMIN D2) 2000 UNITS TABS Take 1 tablet by mouth daily.      Historical Provider, MD  omeprazole (PRILOSEC OTC) 20 MG tablet Take 20 mg by mouth as needed.  Historical Provider, MD    Allergies:  Allergies  Allergen Reactions  . Caine-1 (Lidocaine Hcl) Anaphylaxis  . Codeine Nausea And Vomiting  . Dilaudid (Hydromorphone Hcl) Nausea And Vomiting  . Morphine And Related Nausea And Vomiting  . Percocet (Oxycodone-Acetaminophen) Nausea And Vomiting  . Tramadol Nausea And Vomiting  . Vicodin (Hydrocodone-Acetaminophen) Nausea And Vomiting    Social History:   reports that she has never smoked. She does not have any smokeless tobacco history on file. She does not drink alcohol or use illicit drugs.  Family History:   Father - Lung Cancer   Mother- Breast Cancer, and  Alzheimers    Physical Exam:  GEN:  Pleasant  Obese 67 year old Caucasian Female examined  and in no acute distress; cooperative with exam Filed Vitals:   09/04/12 0050 09/04/12 0205 09/04/12 0504 09/04/12 0515  BP: 99/45 124/53 92/48 92/48   Pulse: 67 67 65   Temp: 99.1 F (37.3 C) 98.8 F (37.1 C) 98.5 F (36.9 C)   TempSrc: Oral Oral Oral   Resp: 15 18 18    Height:  5' 4"  (1.626 m)    Weight:  70.3 kg (154 lb 15.7 oz)    SpO2: 93% 99% 93%    Blood pressure 92/48, pulse 65, temperature 98.5 F (36.9 C), temperature source Oral, resp. rate 18, height 5' 4"  (1.626 m), weight 70.3 kg (154 lb 15.7 oz), SpO2 93.00%. PSYCH: She is alert and oriented x4; does not appear anxious does not appear depressed; affect is normal HEENT: Normocephalic and Atraumatic, Mucous membranes pink; PERRLA; EOM intact; Fundi:  Benign;  No scleral icterus, Nares: Patent, Oropharynx: Clear, Fair Dentition, Neck:  FROM, no cervical lymphadenopathy nor thyromegaly or carotid bruit; no JVD; Breasts:: Not examined CHEST WALL: No tenderness CHEST: Normal respiration, clear to auscultation bilaterally HEART: Regular rate and rhythm; no murmurs rubs or gallops BACK: No kyphosis or scoliosis; no CVA tenderness ABDOMEN: Positive Bowel Sounds Obese,  Soft mildly tender along the RUQ and lower tight rib cage; no masses, no organomegaly.    Rectal Exam: Not done EXTREMITIES: No cyanosis, clubbing or edema; no ulcerations. Genitalia: not examined PULSES: 2+ and symmetric SKIN: Normal hydration no rash or ulceration CNS: Cranial nerves 2-12 grossly intact no focal neurologic deficit   Labs & Imaging Results for orders placed during the hospital encounter of 09/03/12 (from the past 48 hour(s))  URINALYSIS, ROUTINE W REFLEX MICROSCOPIC     Status: Abnormal   Collection Time    09/03/12  7:56 PM      Result Value Range   Color, Urine AMBER (*) YELLOW   Comment: BIOCHEMICALS MAY BE AFFECTED BY COLOR   APPearance  CLEAR  CLEAR   Specific Gravity, Urine 1.028  1.005 - 1.030   pH 6.0  5.0 - 8.0   Glucose, UA 250 (*) NEGATIVE mg/dL   Hgb urine dipstick NEGATIVE  NEGATIVE   Bilirubin Urine SMALL (*) NEGATIVE   Ketones, ur 15 (*) NEGATIVE mg/dL   Protein, ur NEGATIVE  NEGATIVE mg/dL   Urobilinogen, UA 1.0  0.0 - 1.0 mg/dL   Nitrite NEGATIVE  NEGATIVE   Leukocytes, UA SMALL (*) NEGATIVE  URINE MICROSCOPIC-ADD ON     Status: None   Collection Time    09/03/12  7:56 PM      Result Value Range   Squamous Epithelial / LPF RARE  RARE   WBC, UA 3-6  <3 WBC/hpf   Bacteria, UA RARE  RARE   Urine-Other  MUCOUS PRESENT    GLUCOSE, CAPILLARY     Status: Abnormal   Collection Time    09/03/12  9:37 PM      Result Value Range   Glucose-Capillary 166 (*) 70 - 99 mg/dL  COMPREHENSIVE METABOLIC PANEL     Status: Abnormal   Collection Time    09/03/12  9:40 PM      Result Value Range   Sodium 137  135 - 145 mEq/L   Potassium 3.7  3.5 - 5.1 mEq/L   Chloride 98  96 - 112 mEq/L   CO2 27  19 - 32 mEq/L   Glucose, Bld 161 (*) 70 - 99 mg/dL   BUN 21  6 - 23 mg/dL   Creatinine, Ser 0.70  0.50 - 1.10 mg/dL   Calcium 9.3  8.4 - 10.5 mg/dL   Total Protein 7.1  6.0 - 8.3 g/dL   Albumin 3.7  3.5 - 5.2 g/dL   AST 631 (*) 0 - 37 U/L   ALT 447 (*) 0 - 35 U/L   Alkaline Phosphatase 145 (*) 39 - 117 U/L   Total Bilirubin 3.3 (*) 0.3 - 1.2 mg/dL   GFR calc non Af Amer 90 (*) >90 mL/min   GFR calc Af Amer >90  >90 mL/min   Comment:            The eGFR has been calculated     using the CKD EPI equation.     This calculation has not been     validated in all clinical     situations.     eGFR's persistently     <90 mL/min signify     possible Chronic Kidney Disease.  CBC WITH DIFFERENTIAL     Status: Abnormal   Collection Time    09/03/12  9:40 PM      Result Value Range   WBC 12.6 (*) 4.0 - 10.5 K/uL   RBC 4.57  3.87 - 5.11 MIL/uL   Hemoglobin 15.4 (*) 12.0 - 15.0 g/dL   HCT 42.8  36.0 - 46.0 %   MCV 93.7   78.0 - 100.0 fL   MCH 33.7  26.0 - 34.0 pg   MCHC 36.0  30.0 - 36.0 g/dL   RDW 13.0  11.5 - 15.5 %   Platelets 315  150 - 400 K/uL   Neutrophils Relative % 84 (*) 43 - 77 %   Neutro Abs 10.6 (*) 1.7 - 7.7 K/uL   Lymphocytes Relative 8 (*) 12 - 46 %   Lymphs Abs 1.0  0.7 - 4.0 K/uL   Monocytes Relative 8  3 - 12 %   Monocytes Absolute 0.9  0.1 - 1.0 K/uL   Eosinophils Relative 0  0 - 5 %   Eosinophils Absolute 0.0  0.0 - 0.7 K/uL   Basophils Relative 0  0 - 1 %   Basophils Absolute 0.0  0.0 - 0.1 K/uL  LIPASE, BLOOD     Status: None   Collection Time    09/03/12  9:40 PM      Result Value Range   Lipase 22  11 - 59 U/L  PROTIME-INR     Status: Abnormal   Collection Time    09/03/12  9:40 PM      Result Value Range   Prothrombin Time 18.5 (*) 11.6 - 15.2 seconds   INR 1.59 (*) 0.00 - 1.49  LACTIC ACID, PLASMA     Status: None   Collection  Time    09/03/12  9:45 PM      Result Value Range   Lactic Acid, Venous 1.2  0.5 - 2.2 mmol/L  GLUCOSE, CAPILLARY     Status: Abnormal   Collection Time    09/04/12  2:20 AM      Result Value Range   Glucose-Capillary 141 (*) 70 - 99 mg/dL    Radiological Exams on Admission: Ct Abdomen Pelvis W Contrast  09/03/2012   *RADIOLOGY REPORT*  Clinical Data: Right-sided abdominal pain, nausea and diarrhea.  CT ABDOMEN AND PELVIS WITH CONTRAST  Technique:  Multidetector CT imaging of the abdomen and pelvis was performed following the standard protocol during bolus administration of intravenous contrast.  Contrast: 26m OMNIPAQUE IOHEXOL 300 MG/ML  SOLN, 1083mOMNIPAQUE IOHEXOL 300 MG/ML  SOLN  Comparison: 02/16/2011.  Findings: The lung bases are clear except for dependent bibasilar atelectasis.  There are massive esophageal varices noted.  Stable cirrhotic changes involving the liver with cavernous transformation of the portal vein and massive portal venous collaterals.  The spleen is surgically absent.  Stable biliary air likely due to prior  sphincterotomy.  The gallbladder is surgically absent.  The pancreas is unremarkable.  The adrenal glands and kidneys are normal and stable.  No focal hepatic lesion.  Mild stable biliary dilatation.  Stable scattered mesenteric and retroperitoneal lymph nodes but no mass or adenopathy.  The stomach, duodenum, small bowel and colon are grossly normal. The appendix is normal.  No inflammatory changes or mass lesions involving the bowel.  The aorta is normal in caliber.  The major branch vessels are patent.  Stable far left lateral anterior abdominal wall hernia containing fat.  The uterus is surgically absent.  The bladder is normal.  No pelvic mass or adenopathy.  No significant free pelvic fluid collections. No inguinal mass or hernia.  The bony structures are intact.  IMPRESSION:  1.  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 2.  Status post splenectomy and cholecystectomy. 3.  Stable mild biliary dilatation and biliary air. 4.  No acute abdominal/pelvic findings, mass lesions or lymphadenopathy.   Original Report Authenticated By: P.Marijo SanesM.D.    EKG: Independently reviewed.   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  ABD Pain-  Bowel Rest, PRN Pain Meds, IVFs, anti-Emetics.    2.   Incisional Hernia- stable  3.   NASH- Monitor trend of LFTs.    4.   Sub-therapeutic INR-  Pharmacy adjustment of coumadin.     5.   DM2-  Discontinue Metformin, because it may be a source of her discomfort.   SSI coverage PRN.     6.   Leukocytosis- Monitor Trend, Blood cultures sent due to WBCs, and Fever, Empiric IV Zosyn.       Code Status:  FULL CODE Family  Communication:  No Family Present Disposition Plan:  Return to Home on Discharge  Time spent: [facility] Pager [phone]  If 7PM-7AM, please contact night-coverage [email or web address] Password [email or web address]/21/2014, 6:11 AM

## 2012-09-04 NOTE — Progress Notes (Signed)
ANTICOAGULATION CONSULT NOTE - Initial Consult  Pharmacy Consult for Coumadin Indication: Antiphospholipid antibody syndrome  Allergies  Allergen Reactions  . Caine-1 (Lidocaine Hcl) Anaphylaxis  . Codeine Nausea And Vomiting  . Dilaudid (Hydromorphone Hcl) Nausea And Vomiting  . Morphine And Related Nausea And Vomiting  . Percocet (Oxycodone-Acetaminophen) Nausea And Vomiting  . Tramadol Nausea And Vomiting  . Vicodin (Hydrocodone-Acetaminophen) Nausea And Vomiting    Patient Measurements: Height: 5' 4"  (162.6 cm) Weight: 154 lb 15.7 oz (70.3 kg) IBW/kg (Calculated) : 54.7  Vital Signs: Temp: 98.5 F (36.9 C) (05/21 0504) Temp src: Oral (05/21 0504) BP: 92/48 mmHg (05/21 0515) Pulse Rate: 65 (05/21 0504)  Labs:  Recent Labs  09/03/12 2140  HGB 15.4*  HCT 42.8  PLT 315  LABPROT 18.5*  INR 1.59*  CREATININE 0.70    Estimated Creatinine Clearance: 68.3 ml/min (by C-G formula based on Cr of 0.7).   Medical History: Past Medical History  Diagnosis Date  . TIA (transient ischemic attack)   . Hypertension   . Anti-cardiolipin antibody syndrome     antiphospholipid antibody syndrome  . Diverticulitis   . Portal hypertension   . Varices, gastric   . Varices, esophageal   . Fibromyalgia   . Celiac disease   . Diabetes mellitus   . PONV (postoperative nausea and vomiting)   . Heart murmur   . Asthma   . Blood dyscrasia     anticardiolipid syndrome  . Stroke     tia  . Blood transfusion   . GERD (gastroesophageal reflux disease)   . Arthritis   . DVT (deep venous thrombosis) 05/01/2011  . Varices 05/01/2011  . NASH (nonalcoholic steatohepatitis) 05/01/2011    Medications:  Prescriptions prior to admission  Medication Sig Dispense Refill  . azelastine (ASTELIN) 137 MCG/SPRAY nasal spray Place 1 spray into the nose as needed.       . benazepril (LOTENSIN) 20 MG tablet Take 20 mg by mouth daily.        . metFORMIN (GLUCOPHAGE) 500 MG tablet Take 500 mg by  mouth daily.      . propranolol (INDERAL) 80 MG tablet Take 80 mg by mouth daily.        Marland Kitchen COUMADIN 7.5 MG tablet TAKE 1 TABLET BY MOUTH EVERY DAY  30 tablet  3  . DIGESTIVE ENZYMES PO Take 1 capsule by mouth daily. Astaxanthin      . Ergocalciferol (VITAMIN D2) 2000 UNITS TABS Take 1 tablet by mouth daily.        Marland Kitchen omeprazole (PRILOSEC OTC) 20 MG tablet Take 20 mg by mouth as needed.         Assessment: 67 yo female admitted with abdominal pain, h/o DVT, for Coumadin   Goal of Therapy:  INR 2-3 Monitor platelets by anticoagulation protocol: Yes   Plan:  Coumadin 10 mg today Daily INR   Caryl Pina 09/04/2012,6:25 AM

## 2012-09-04 NOTE — Progress Notes (Signed)
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 The patient had one episode of emesis yesterday. She also had a fever of 101.63F on 09/02/2012. The patient denies any new medications except for metformin which she started one week ago. She denies taking any NSAIDs or any other supplements over-the-counter. He has not had any recent travels nor is she any raw or undercooked foods. The patient endorses compliance with all her medications included warfarin. The patient was noted to have elevated transaminases with AST 631, ALT 447, phosphatase 145, total bilirubin 3.3. He also had a fever of 101.90F in the emergency department. Blood cultures have been performed and the patient was apparently started on Zosyn. There is a listed diagnosis of cirrhosis of the liver with esophageal and gastric varices.   Assessment/Plan: Abdominal pain with fever/transaminasemia -Check hepatitis B surface antigen -Check HIV -Check hepatitis C antibody -Urine drug screen -?? Due to metformin -CT abdomen without any acute changes to explain abdominal pain -Lipase was normal -Question another thrombotic event--heptatic vein thrombosis? 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 -INR subtherapeutic -consult GI-pt follows [facility] -no ascites on CT, low suspicion of SBP Leukocytosis -Urinalysis does not suggest UTI - Continue empiric antibiotics -Blood cultures have been performed and will follow Antiphospholipid antibody syndrome -Continue Coumadin -PharmD assisting with dose Diabetes mellitus type  2 -Discontinue metformin -NovoLog sliding scale -Hemoglobin A1c 6.9, but metformin started only one week ago   Family Communication:   Pt at beside Disposition Plan:   Home when medically stable  Antibiotics:  Zosyn 09/03/12>>>  Vanc 09/04/12>>>    Procedures/Studies: Ct Abdomen Pelvis W Contrast  09/03/2012   *RADIOLOGY REPORT*  Clinical Data: Right-sided abdominal pain, nausea and diarrhea.  CT ABDOMEN AND PELVIS WITH CONTRAST  Technique:  Multidetector CT imaging of the abdomen and pelvis was performed following the standard protocol during bolus administration of intravenous contrast.  Contrast: 60m OMNIPAQUE IOHEXOL 300 MG/ML  SOLN, 1048mOMNIPAQUE IOHEXOL 300 MG/ML  SOLN  Comparison: 02/16/2011.  Findings: The lung bases are clear except for dependent bibasilar atelectasis.  There are massive esophageal varices noted.  Stable cirrhotic changes involving the liver with cavernous transformation of the portal vein and massive portal venous collaterals.  The spleen is surgically absent.  Stable biliary air likely due to prior sphincterotomy.  The gallbladder is surgically absent.  The pancreas is unremarkable.  The adrenal glands and kidneys are normal and stable.  No focal hepatic lesion.  Mild stable biliary dilatation.  Stable scattered mesenteric and retroperitoneal lymph nodes but no mass or adenopathy.  The stomach, duodenum, small bowel and colon are grossly normal. The appendix is normal.  No inflammatory changes or mass lesions involving the bowel.  The aorta is normal in caliber.  The major branch vessels are patent.  Stable far left lateral anterior abdominal wall hernia containing fat.  The uterus is surgically absent.  The bladder is normal.  No pelvic mass or adenopathy.  No significant free pelvic fluid collections. No inguinal mass or hernia.  The bony structures are intact.  IMPRESSION:  1.  Stable cirrhotic changes with cavernous transformation of the portal  vein and massive  portal venous collaterals and esophageal varices. 2.  Status post splenectomy and cholecystectomy. 3.  Stable mild biliary dilatation and biliary air. 4.  No acute abdominal/pelvic findings, mass lesions or lymphadenopathy.   Original Report Authenticated By: Marijo Sanes, M.D.         Subjective: Patient denies fevers, chills, chest pain, shortness of breath, vomiting. She states that her nausea persists. Abdominal pain is 50% better today. No diarrhea or hematochezia or hematemesis. No dysuria or hematuria.   Objective: Filed Vitals:   09/04/12 0205 09/04/12 0504 09/04/12 0515 09/04/12 0626  BP: 124/53 92/48 92/48  94/58  Pulse: 67 65    Temp: 98.8 F (37.1 C) 98.5 F (36.9 C)    TempSrc: Oral Oral    Resp: 18 18    Height: 5' 4"  (1.626 m)     Weight: 70.3 kg (154 lb 15.7 oz)     SpO2: 99% 93%      Intake/Output Summary (Last 24 hours) at 09/04/12 1302 Last data filed at 09/04/12 1106  Gross per 24 hour  Intake    770 ml  Output   1200 ml  Net   -430 ml   Weight change:  Exam:   General:  Pt is alert, follows commands appropriately, not in acute distress  HEENT: No icterus, No thrush,  Scott/AT  Cardiovascular: RRR, S1/S2, no rubs, no gallops  Respiratory: CTA bilaterally, no wheezing, no crackles, no rhonchi  Abdomen: Soft/+BS, non tender, non distended, no guarding  Extremities: trace edema, No lymphangitis, No petechiae, No rashes, no synovitis  Data Reviewed: Basic Metabolic Panel:  Recent Labs Lab 09/03/12 2140  NA 137  K 3.7  CL 98  CO2 27  GLUCOSE 161*  BUN 21  CREATININE 0.70  CALCIUM 9.3   Liver Function Tests:  Recent Labs Lab 09/03/12 2140  AST 631*  ALT 447*  ALKPHOS 145*  BILITOT 3.3*  PROT 7.1  ALBUMIN 3.7    Recent Labs Lab 09/03/12 2140  LIPASE 22   No results found for this basename: AMMONIA,  in the last 168 hours CBC:  Recent Labs Lab 09/03/12 2140  WBC 12.6*  NEUTROABS 10.6*  HGB 15.4*  HCT 42.8  MCV 93.7   PLT 315   Cardiac Enzymes: No results found for this basename: CKTOTAL, CKMB, CKMBINDEX, TROPONINI,  in the last 168 hours BNP: No components found with this basename: POCBNP,  CBG:  Recent Labs Lab 09/03/12 2137 09/04/12 0220 09/04/12 0736 09/04/12 1158  GLUCAP 166* 141* 183* 122*    No results found for this or any previous visit (from the past 240 hour(s)).   Scheduled Meds: . sodium chloride   Intravenous STAT  . insulin aspart  0-9 Units Subcutaneous Q4H  . ondansetron      . piperacillin-tazobactam (ZOSYN)  IV  3.375 g Intravenous Q8H  . warfarin  10 mg Oral ONCE-1800  . Warfarin - Pharmacist Dosing Inpatient   Does not apply q1800   Continuous Infusions: . sodium chloride 125 mL/hr (09/03/12 2318)  . sodium chloride 100 mL/hr at 09/04/12 0650     Birdell Frasier, DO  [facility] Pager [phone]  If 7PM-7AM, please contact night-coverage [email or web address] Password [email or web address] 09/04/2012, 1:02 PM   LOS: 1 day

## 2012-09-04 NOTE — Progress Notes (Signed)
ANTIBIOTIC CONSULT NOTE - INITIAL  Pharmacy Consult for vancomycin Indication: SIRS  Allergies  Allergen Reactions  . Caine-1 (Lidocaine Hcl) Anaphylaxis  . Codeine Nausea And Vomiting  . Dilaudid (Hydromorphone Hcl) Nausea And Vomiting    Can take with zofran  . Morphine And Related Nausea And Vomiting  . Percocet (Oxycodone-Acetaminophen) Nausea And Vomiting  . Tramadol Nausea And Vomiting  . Vicodin (Hydrocodone-Acetaminophen) Nausea And Vomiting  . Warfarin And Related Other (See Comments)    Generic warfarin is ineffective, patient uses NameBrand Coumadin only    Patient Measurements: Height: 5' 4"  (162.6 cm) Weight: 154 lb 15.7 oz (70.3 kg) IBW/kg (Calculated) : 54.7 Adjusted Body Weight:   Vital Signs: Temp: 98.5 F (36.9 C) (05/21 0504) Temp src: Oral (05/21 0504) BP: 94/58 mmHg (05/21 0626) Pulse Rate: 65 (05/21 0504) Intake/Output from previous day: 05/20 0701 - 05/21 0700 In: 770 [P.O.:120; I.V.:600; IV Piggyback:50] Out: 400 [Urine:400] Intake/Output from this shift: Total I/O In: -  Out: 800 [Urine:800]  Labs:  Recent Labs  09/03/12 2140  WBC 12.6*  HGB 15.4*  PLT 315  CREATININE 0.70   Estimated Creatinine Clearance: 68.3 ml/min (by C-G formula based on Cr of 0.7). No results found for this basename: VANCOTROUGH, VANCOPEAK, VANCORANDOM, GENTTROUGH, GENTPEAK, GENTRANDOM, TOBRATROUGH, TOBRAPEAK, TOBRARND, AMIKACINPEAK, AMIKACINTROU, AMIKACIN,  in the last 72 hours   Microbiology: No results found for this or any previous visit (from the past 720 hour(s)).  Medical History: Past Medical History  Diagnosis Date  . TIA (transient ischemic attack)   . Hypertension   . Anti-cardiolipin antibody syndrome     antiphospholipid antibody syndrome  . Diverticulitis   . Portal hypertension   . Varices, gastric   . Varices, esophageal   . Fibromyalgia   . Celiac disease   . Diabetes mellitus   . PONV (postoperative nausea and vomiting)   . Heart  murmur   . Asthma   . Blood dyscrasia     anticardiolipid syndrome  . Stroke     tia  . Blood transfusion   . GERD (gastroesophageal reflux disease)   . Arthritis   . DVT (deep venous thrombosis) 05/01/2011  . Varices 05/01/2011  . NASH (nonalcoholic steatohepatitis) 05/01/2011    Medications:  Anti-infectives   Start     Dose/Rate Route Frequency Ordered Stop   09/04/12 1400  vancomycin (VANCOCIN) IVPB 1000 mg/200 mL premix     1,000 mg 200 mL/hr over 60 Minutes Intravenous Every 12 hours 09/04/12 1319     09/04/12 0615  piperacillin-tazobactam (ZOSYN) IVPB 3.375 g     3.375 g 12.5 mL/hr over 240 Minutes Intravenous 3 times per day 09/04/12 0606     09/03/12 2345  Ampicillin-Sulbactam (UNASYN) 3 g in sodium chloride 0.9 % 100 mL IVPB     3 g 100 mL/hr over 60 Minutes Intravenous  Once 09/03/12 2336 09/04/12 0117     Assessment: 72 yof presented to the ED with abdominal pain. She was started on zosyn earlier this AM. Now broadening with vancomycin.   Goal of Therapy:  Vancomycin trough level 15-20 mcg/ml  Plan:  1. Vanc 1gm IV Q12H 2. F/u renal fxn, C&S, clinical status and trough at [place], Rande Lawman 09/04/2012,1:19 PM

## 2012-09-05 ENCOUNTER — Inpatient Hospital Stay (HOSPITAL_COMMUNITY): Payer: BC Managed Care – PPO

## 2012-09-05 DIAGNOSIS — I8500 Esophageal varices without bleeding: Secondary | ICD-10-CM

## 2012-09-05 DIAGNOSIS — R791 Abnormal coagulation profile: Secondary | ICD-10-CM

## 2012-09-05 LAB — HEPATITIS C ANTIBODY: HCV Ab: NEGATIVE

## 2012-09-05 LAB — URINE CULTURE: Colony Count: 6000

## 2012-09-05 LAB — GLUCOSE, CAPILLARY
Glucose-Capillary: 104 mg/dL — ABNORMAL HIGH (ref 70–99)
Glucose-Capillary: 124 mg/dL — ABNORMAL HIGH (ref 70–99)
Glucose-Capillary: 126 mg/dL — ABNORMAL HIGH (ref 70–99)
Glucose-Capillary: 150 mg/dL — ABNORMAL HIGH (ref 70–99)
Glucose-Capillary: 166 mg/dL — ABNORMAL HIGH (ref 70–99)
Glucose-Capillary: 191 mg/dL — ABNORMAL HIGH (ref 70–99)

## 2012-09-05 LAB — COMPREHENSIVE METABOLIC PANEL
ALT: 199 U/L — ABNORMAL HIGH (ref 0–35)
AST: 126 U/L — ABNORMAL HIGH (ref 0–37)
Albumin: 3 g/dL — ABNORMAL LOW (ref 3.5–5.2)
Alkaline Phosphatase: 123 U/L — ABNORMAL HIGH (ref 39–117)
BUN: 6 mg/dL (ref 6–23)
CO2: 23 mEq/L (ref 19–32)
Calcium: 8.9 mg/dL (ref 8.4–10.5)
Chloride: 103 mEq/L (ref 96–112)
Creatinine, Ser: 0.76 mg/dL (ref 0.50–1.10)
GFR calc Af Amer: >90 mL/min (ref >90)
GFR calc non Af Amer: 87 mL/min — ABNORMAL LOW (ref >90)
Glucose, Bld: 123 mg/dL — ABNORMAL HIGH (ref 70–99)
Potassium: 3.6 mEq/L (ref 3.5–5.1)
Sodium: 140 mEq/L (ref 135–145)
Total Bilirubin: 2.1 mg/dL — ABNORMAL HIGH (ref 0.3–1.2)
Total Protein: 6 g/dL (ref 6.0–8.3)

## 2012-09-05 LAB — CBC
HCT: 36.6 % (ref 36.0–46.0)
Hemoglobin: 13.1 g/dL (ref 12.0–15.0)
MCH: 33.9 pg (ref 26.0–34.0)
MCHC: 35.8 g/dL (ref 30.0–36.0)
MCV: 94.6 fL (ref 78.0–100.0)
Platelets: 270 10*3/uL (ref 150–400)
RBC: 3.87 MIL/uL (ref 3.87–5.11)
RDW: 13.5 % (ref 11.5–15.5)
WBC: 7.1 10*3/uL (ref 4.0–10.5)

## 2012-09-05 LAB — HEPATITIS B SURFACE ANTIGEN: Hepatitis B Surface Ag: NEGATIVE

## 2012-09-05 LAB — PROTIME-INR
INR: 1.43 (ref 0.00–1.49)
Prothrombin Time: 17.1 seconds — ABNORMAL HIGH (ref 11.6–15.2)

## 2012-09-05 LAB — HIV ANTIBODY (ROUTINE TESTING W REFLEX): HIV: NONREACTIVE

## 2012-09-05 MED ORDER — ENOXAPARIN SODIUM 80 MG/0.8ML ~~LOC~~ SOLN: 1.0000 mg/kg | Freq: Two times a day (BID) | SUBCUTANEOUS | Status: DC

## 2012-09-05 MED ORDER — GADOBENATE DIMEGLUMINE 529 MG/ML IV SOLN: 17.0000 mL | Freq: Once | INTRAVENOUS | Status: DC

## 2012-09-05 MED ORDER — ENOXAPARIN SODIUM 80 MG/0.8ML ~~LOC~~ SOLN
1.0000 mg/kg | Freq: Two times a day (BID) | SUBCUTANEOUS | Status: DC
  Administered 2012-09-06 – 2012-09-07 (×3): 70 mg via SUBCUTANEOUS
  Filled 2012-09-05 (×7): qty 0.8

## 2012-09-05 MED ORDER — WARFARIN SODIUM 10 MG PO TABS
10.0000 mg | ORAL_TABLET | Freq: Once | ORAL | Status: AC
  Administered 2012-09-05: 10 mg via ORAL
  Filled 2012-09-05: qty 1

## 2012-09-05 MED ORDER — ENOXAPARIN SODIUM 80 MG/0.8ML ~~LOC~~ SOLN
1.0000 mg/kg | SUBCUTANEOUS | Status: AC
Start: 2012-09-05 — End: 2012-09-05
  Administered 2012-09-05: 70 mg via SUBCUTANEOUS
  Filled 2012-09-05: qty 0.8

## 2012-09-05 NOTE — Progress Notes (Addendum)
[facility] PROGRESS NOTE  Laurie Green LYY:[id number] DOB: 1945/10/11 DOA: 09/03/2012 PCP: Marjorie Smolder, MD  Brief narrative 67 year old female with a history of diabetes mellitus, fibromyalgia, NASH, and antiphospholipid antibody syndrome with portal vein thrombosis and  TIAs  on chronic Coumadin, gall stones s/p cholecystectomy and 2 ERCPs for CBD stones requiring sphincterotomy and  splenectomy in 02 for acute abdominal pain,  presents with one-month history of abdominal pain which has worsened in the past 2 days associated with nausea and dry heaves.  She also had a fever of 101.67F on 09/02/2012. The patient denies any new medications except for metformin which she started one week ago. She denies taking any NSAIDs or any other supplements over-the-counter. He has not had any recent travels nor is she any raw or undercooked foods. The patient endorses compliance with all her medications included warfarin. The patient was noted to have marked transaminitis with AST 631, ALT 447, alk phosphatase 145 and  total bilirubin 3.3.  Blood cultures sent  and the patient was empirically  started on Zosyn.   Assessment/Plan:  Abdominal pain with fever/transaminitis - hepatitis B surface antigen, HIV ,  hepatitis C antibody negative,  -CT abdomen without any acute changes to explain abdominal pain . Noted for esophageal varices which per GI is a new finding. -Lipase  normal  -INR subtherapeutic  appreciate [facility] eval. Recommended MRCP to r/o CBD stone but as patient was claustrophobic and we could not sedate her as she would need to follow instructions during MRCP, the test was aborted. [facility] notified.  currently pain free.  continue IV fluids and clears. zofran prn for nausea  Fever with Leukocytosis  -Urinalysis does not suggest UTI  - Continue empiric antibiotics  -Blood cultures sent -use tylenol with caution given transaminitis  Antiphospholipid antibody syndrome  -Continue  Coumadin  -INR subtherapeutic. Bridging with therapeutic lovenox  Diabetes mellitus type 2  -Discontinue metformin ( was started only 1 week back) -NovoLog sliding scale  -Hemoglobin A1c 6.9   Antibiotics:  Zosyn 09/03/12>>>  Vanc 09/04/12>>>   Code Status: full Family Communication: none at bedside Disposition Plan:home once stable   Consultants:  [facility]  Procedures:  none  Antibiotics:  none  HPI/Subjective: Patient seen and examined this morning. denies any abdominal pain. Couldn't get MRCP as she was claustrophobic and since she needed to follow instructions while on the machine , could not sedate her.   Objective: Filed Vitals:   09/04/12 1343 09/04/12 2127 09/05/12 0554 09/05/12 1357  BP: 115/61 124/60 126/56 144/70  Pulse: 60 56 60 65  Temp: 98.5 F (36.9 C) 99 F (37.2 C) 98.6 F (37 C) 97.9 F (36.6 C)  TempSrc: Oral Oral Oral Oral  Resp: 17 18 18 18   Height:      Weight:      SpO2: 96% 95% 94% 97%    Intake/Output Summary (Last 24 hours) at 09/05/12 1505 Last data filed at 09/05/12 0700  Gross per 24 hour  Intake   1650 ml  Output   2800 ml  Net  -1150 ml   Filed Weights   09/03/12 1952 09/04/12 0205  Weight: 67.586 kg (149 lb) 70.3 kg (154 lb 15.7 oz)    Exam:   General:  Middle aged female in NAD  HEENT: no pallor , moist oral mucosa  Cardiovascular: N S1&S2, no murmurs  Respiratory: clear b/l , no added sounds  Abdomen: soft, NT, ND, BS+  Musculoskeletal: warm, no edema  CNS: AAOX3  Data Reviewed: Basic Metabolic Panel:  Recent Labs Lab 09/03/12 2140 09/05/12 0705  NA 137 140  K 3.7 3.6  CL 98 103  CO2 27 23  GLUCOSE 161* 123*  BUN 21 6  CREATININE 0.70 0.76  CALCIUM 9.3 8.9   Liver Function Tests:  Recent Labs Lab 09/03/12 2140 09/05/12 0705  AST 631* 126*  ALT 447* 199*  ALKPHOS 145* 123*  BILITOT 3.3* 2.1*  PROT 7.1 6.0  ALBUMIN 3.7 3.0*    Recent Labs Lab 09/03/12 2140  LIPASE 22   No  results found for this basename: AMMONIA,  in the last 168 hours CBC:  Recent Labs Lab 09/03/12 2140 09/05/12 0705  WBC 12.6* 7.1  NEUTROABS 10.6*  --   HGB 15.4* 13.1  HCT 42.8 36.6  MCV 93.7 94.6  PLT 315 270   Cardiac Enzymes: No results found for this basename: CKTOTAL, CKMB, CKMBINDEX, TROPONINI,  in the last 168 hours BNP (last 3 results) No results found for this basename: PROBNP,  in the last 8760 hours CBG:  Recent Labs Lab 09/04/12 1921 09/04/12 2347 09/05/12 0338 09/05/12 0757 09/05/12 1144  GLUCAP 154* 100* 150* 124* 126*    Recent Results (from the past 240 hour(s))  URINE CULTURE     Status: None   Collection Time    09/03/12  7:48 PM      Result Value Range Status   Specimen Description URINE, CLEAN CATCH   Final   Special Requests Immunocompromised   Final   Culture  Setup Time 09/04/2012 09:33   Final   Colony Count 6,000 COLONIES/ML   Final   Culture INSIGNIFICANT GROWTH   Final   Report Status 09/05/2012 FINAL   Final  CULTURE, BLOOD (ROUTINE X 2)     Status: None   Collection Time    09/04/12 12:01 AM      Result Value Range Status   Specimen Description BLOOD RIGHT ARM   Final   Special Requests BOTTLES DRAWN AEROBIC AND ANAEROBIC 10CC EACH   Final   Culture  Setup Time 09/04/2012 09:20   Final   Culture     Final   Value:        BLOOD CULTURE RECEIVED NO GROWTH TO DATE CULTURE WILL BE HELD FOR 5 DAYS BEFORE ISSUING A FINAL NEGATIVE REPORT   Report Status PENDING   Incomplete  CULTURE, BLOOD (ROUTINE X 2)     Status: None   Collection Time    09/04/12 12:03 AM      Result Value Range Status   Specimen Description BLOOD LEFT ARM   Final   Special Requests     Final   Value: BOTTLES DRAWN AEROBIC AND ANAEROBIC 10CC AER 5CC ANA   Culture  Setup Time 09/04/2012 09:20   Final   Culture     Final   Value:        BLOOD CULTURE RECEIVED NO GROWTH TO DATE CULTURE WILL BE HELD FOR 5 DAYS BEFORE ISSUING A FINAL NEGATIVE REPORT   Report Status  PENDING   Incomplete     Studies: Ct Abdomen Pelvis W Contrast  09/03/2012   *RADIOLOGY REPORT*  Clinical Data: Right-sided abdominal pain, nausea and diarrhea.  CT ABDOMEN AND PELVIS WITH CONTRAST  Technique:  Multidetector CT imaging of the abdomen and pelvis was performed following the standard protocol during bolus administration of intravenous contrast.  Contrast: 48m OMNIPAQUE IOHEXOL 300 MG/ML  SOLN, 1043mOMNIPAQUE IOHEXOL 300  MG/ML  SOLN  Comparison: 02/16/2011.  Findings: The lung bases are clear except for dependent bibasilar atelectasis.  There are massive esophageal varices noted.  Stable cirrhotic changes involving the liver with cavernous transformation of the portal vein and massive portal venous collaterals.  The spleen is surgically absent.  Stable biliary air likely due to prior sphincterotomy.  The gallbladder is surgically absent.  The pancreas is unremarkable.  The adrenal glands and kidneys are normal and stable.  No focal hepatic lesion.  Mild stable biliary dilatation.  Stable scattered mesenteric and retroperitoneal lymph nodes but no mass or adenopathy.  The stomach, duodenum, small bowel and colon are grossly normal. The appendix is normal.  No inflammatory changes or mass lesions involving the bowel.  The aorta is normal in caliber.  The major branch vessels are patent.  Stable far left lateral anterior abdominal wall hernia containing fat.  The uterus is surgically absent.  The bladder is normal.  No pelvic mass or adenopathy.  No significant free pelvic fluid collections. No inguinal mass or hernia.  The bony structures are intact.  IMPRESSION:  1.  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 2.  Status post splenectomy and cholecystectomy. 3.  Stable mild biliary dilatation and biliary air. 4.  No acute abdominal/pelvic findings, mass lesions or lymphadenopathy.   Original Report  Authenticated By: Marijo Sanes, M.D.    Scheduled Meds: . enoxaparin (LOVENOX) injection  1 mg/kg Subcutaneous NOW  . enoxaparin (LOVENOX) injection  1 mg/kg Subcutaneous Q12H  . gadobenate dimeglumine  17 mL Intravenous Once  . insulin aspart  0-9 Units Subcutaneous Q4H  . piperacillin-tazobactam (ZOSYN)  IV  3.375 g Intravenous Q8H  . vancomycin  1,000 mg Intravenous Q12H  . warfarin  10 mg Oral ONCE-1800  . Warfarin - Pharmacist Dosing Inpatient   Does not apply q1800   Continuous Infusions: . sodium chloride 125 mL/hr (09/03/12 2318)  . sodium chloride 1,000 mL (09/05/12 1011)       Time spent: 25 minutes   Caryn Gienger  [facility] Pager [phone]. If 7PM-7AM, please contact night-coverage at [email or web address], password [facility] 09/05/2012, 3:05 PM  LOS: 2 days

## 2012-09-05 NOTE — Progress Notes (Signed)
Nutrition Brief Note  Patient identified on the Malnutrition Screening Tool (MST) Report  Body mass index is 26.59 kg/(m^2). Patient meets criteria for overweight based on current BMI.   Current diet order is clear liquids, patient is consuming approximately sips meals at this time. Labs and medications reviewed.   Pt reports decreased appetite and "a few pounds" of wt loss PTA.  Wt loss is not clinically significant and pt reports hunger at time of visit.  No nutrition interventions warranted at this time. Will continue to monitor for diet progression and adequate intake. If nutrition issues arise, please consult RD.   Brynda Greathouse, MS RD LDN Clinical Inpatient Dietitian Pager: [phone] Weekend/After hours pager: [phone]

## 2012-09-05 NOTE — Progress Notes (Signed)
Notified Dr. [name] of pt being unable to complete MRCP d/t claustrophobia.  States, "will address in am"

## 2012-09-05 NOTE — Progress Notes (Signed)
ANTICOAGULATION CONSULT NOTE - Follow Up Consult  Pharmacy Consult for Coumadin Indication: Antiphospholipid antibody syndrome  Allergies  Allergen Reactions  . Caine-1 (Lidocaine Hcl) Anaphylaxis  . Codeine Nausea And Vomiting  . Dilaudid (Hydromorphone Hcl) Nausea And Vomiting    Can take with zofran  . Morphine And Related Nausea And Vomiting  . Percocet (Oxycodone-Acetaminophen) Nausea And Vomiting  . Tramadol Nausea And Vomiting  . Vicodin (Hydrocodone-Acetaminophen) Nausea And Vomiting  . Warfarin And Related Other (See Comments)    Generic warfarin is ineffective, patient uses NameBrand Coumadin only    Patient Measurements: Height: 5' 4"  (162.6 cm) Weight: 154 lb 15.7 oz (70.3 kg) IBW/kg (Calculated) : 54.7  Vital Signs: Temp: 98.6 F (37 C) (05/22 0554) Temp src: Oral (05/22 0554) BP: 126/56 mmHg (05/22 0554) Pulse Rate: 60 (05/22 0554)  Labs:  Recent Labs  09/03/12 2140 09/05/12 0705  HGB 15.4* 13.1  HCT 42.8 36.6  PLT 315 270  LABPROT 18.5* 17.1*  INR 1.59* 1.43  CREATININE 0.70 0.76    Estimated Creatinine Clearance: 68.3 ml/min (by C-G formula based on Cr of 0.7).   Medications:  Prescriptions prior to admission  Medication Sig Dispense Refill  . azelastine (ASTELIN) 137 MCG/SPRAY nasal spray Place 1 spray into the nose as needed.       . benazepril (LOTENSIN) 20 MG tablet Take 20 mg by mouth daily.        Marland Kitchen COUMADIN 7.5 MG tablet TAKE 1 TABLET BY MOUTH EVERY DAY  30 tablet  3  . DIGESTIVE ENZYMES PO Take 1 capsule by mouth daily. Astaxanthin      . Ergocalciferol (VITAMIN D2) 2000 UNITS TABS Take 1 tablet by mouth daily.        Marland Kitchen glucose 4 GM chewable tablet Chew 16 g by mouth as needed for low blood sugar.      . metFORMIN (GLUCOPHAGE) 500 MG tablet Take 500 mg by mouth daily.      Marland Kitchen omeprazole (PRILOSEC OTC) 20 MG tablet Take 20 mg by mouth daily as needed (acid reflux).       . propranolol (INDERAL) 80 MG tablet Take 80 mg by mouth daily.           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 Noted she is on Coumadin chronically, and continues while she is admitted. INR was sub-therapeutic on admit, and has decreased further, patient reportedly took her last dose on 5/19, it appears she missed a dose on 5/20. H/H and plts are stable, no overt bleeding reported.  Goal of Therapy:  INR 2-3 Monitor platelets by anticoagulation protocol: Yes   Plan:  - Repeat boosted dose of Coumadin 31m PO x 1 again today - Suggest Lovenox 47mSQ daily until INR > 2 - Will f/up daily PT/INR  Thanks, Torsten Weniger K. PaPosey ProntoPharmD, BCPS.  Clinical Pharmacist Pager [phone] 33695/22/2014 11:05 AM

## 2012-09-05 NOTE — Progress Notes (Signed)
Pt came down to MRI for MRCP.  She said she was severely claustrophobic but agreed to at least try.  When she got halfway towards the gantry, pt became short of breath and started crying saying she could not handle the exam.  Floor was notified, pt was sent back to room.

## 2012-09-05 NOTE — Consult Note (Signed)
[facility] CONSULT Reason for consult: abdominal pain Referring Physician: [facility]. PCP: Dr. [name]. Primary G.I.: Dr. [name]. Hepatology: Dr.[name]. Hematologist: Dr. [name] is an 67 y.o. female.  HPI: 67 year old woman and I have been following for about 20 years. She has a very complicated and long history. 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 This is resulted in a number of problems related to her hypercoagulable state. This has included TIAs, chronic portal vein thrombosis causing abdominal pain. 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 She has had opened cholecystectomy, liver biopsies showing no gross cirrhosis and 2 post cholecystectomy ERCP in sphincterotomy's because of  retained common bile duct stones. Last ERCP/sphincterotomy was associated with the EUS 2011 by Dr. [name] . This also confirmed portal vein thrombosis and extensive collaterals including varices. EGD's have shown 1 to 2+ varices. She has never had a variceal bleed. She has been evaluated at the hepatology clinic at [facility] and has been followed their by Dr. [name]. It was felt that she needed to be considered for transplantation since her liver function was good. It was not clear that she actually had cirrhosis of the liver based on imaging studies but rather had portal vein thrombosis of a chronic in nature. They suggested continued anticoagulation. The patient was admitted right-sided abdominal pain and fever and has been started on antibiotics. CT scan was nonrevealing. She notes that she has been feeling about the same for several months with acute fever and pain yesterday. She feels better today it is hungry.   Past Medical History  Diagnosis Date  . TIA (transient ischemic attack)   . Hypertension   .  Anti-cardiolipin antibody syndrome     antiphospholipid antibody syndrome  . Diverticulitis   . Portal hypertension   . Varices, gastric   . Varices, esophageal   . Fibromyalgia   . Celiac disease   . Diabetes mellitus   . PONV (postoperative nausea and vomiting)   . Heart murmur   . Asthma   . Blood dyscrasia     anticardiolipid syndrome  . Stroke     tia  . Blood transfusion   . GERD (gastroesophageal reflux disease)   . Arthritis   . DVT (deep venous thrombosis) 05/01/2011  . Varices 05/01/2011  . NASH (nonalcoholic steatohepatitis) 05/01/2011    Past Surgical History  Procedure Laterality Date  . Colon surgery    . Abdominal hysterectomy    . Splenectomy, total    . Cholecystectomy    . Hernia repair      luq incisional  . Ankle fracture surgery    . Esophagogastroduodenoscopy  04/28/2011    Procedure: ESOPHAGOGASTRODUODENOSCOPY (EGD);  Surgeon: [name]., MD;  Location: [facility] ENDOSCOPY;  Service: Endoscopy;  Laterality: N/A;    No family history on file.  Social History:  reports that she has never smoked. She does not have any smokeless tobacco history on file. She reports that she does not drink alcohol or use illicit drugs.  Allergies:  Allergies  Allergen Reactions  . Caine-1 (Lidocaine Hcl) Anaphylaxis  . Codeine Nausea And Vomiting  . Dilaudid (Hydromorphone Hcl) Nausea And Vomiting    Can take with zofran  . Morphine And Related Nausea And Vomiting  . Percocet (Oxycodone-Acetaminophen) Nausea And Vomiting  . Tramadol Nausea And Vomiting  . Vicodin (Hydrocodone-Acetaminophen) Nausea  And Vomiting  . Warfarin And Related Other (See Comments)    Generic warfarin is ineffective, patient uses NameBrand Coumadin only    Medications; . insulin aspart  0-9 Units Subcutaneous Q4H  . piperacillin-tazobactam (ZOSYN)  IV  3.375 g Intravenous Q8H  . vancomycin  1,000 mg Intravenous Q12H  . Warfarin - Pharmacist Dosing Inpatient   Does not apply q1800    PRN Meds acetaminophen, acetaminophen, alum & mag hydroxide-simeth, diphenhydrAMINE, fentaNYL, ondansetron (ZOFRAN) IV, ondansetron, zolpidem Results for orders placed during the hospital encounter of 09/03/12 (from the past 48 hour(s))  URINALYSIS, ROUTINE W REFLEX MICROSCOPIC     Status: Abnormal   Collection Time    09/03/12  7:56 PM      Result Value Range   Color, Urine AMBER (*) YELLOW   Comment: BIOCHEMICALS MAY BE AFFECTED BY COLOR   APPearance CLEAR  CLEAR   Specific Gravity, Urine 1.028  1.005 - 1.030   pH 6.0  5.0 - 8.0   Glucose, UA 250 (*) NEGATIVE mg/dL   Hgb urine dipstick NEGATIVE  NEGATIVE   Bilirubin Urine SMALL (*) NEGATIVE   Ketones, ur 15 (*) NEGATIVE mg/dL   Protein, ur NEGATIVE  NEGATIVE mg/dL   Urobilinogen, UA 1.0  0.0 - 1.0 mg/dL   Nitrite NEGATIVE  NEGATIVE   Leukocytes, UA SMALL (*) NEGATIVE  URINE MICROSCOPIC-ADD ON     Status: None   Collection Time    09/03/12  7:56 PM      Result Value Range   Squamous Epithelial / LPF RARE  RARE   WBC, UA 3-6  <3 WBC/hpf   Bacteria, UA RARE  RARE   Urine-Other MUCOUS PRESENT    GLUCOSE, CAPILLARY     Status: Abnormal   Collection Time    09/03/12  9:37 PM      Result Value Range   Glucose-Capillary 166 (*) 70 - 99 mg/dL  COMPREHENSIVE METABOLIC PANEL     Status: Abnormal   Collection Time    09/03/12  9:40 PM      Result Value Range   Sodium 137  135 - 145 mEq/L   Potassium 3.7  3.5 - 5.1 mEq/L   Chloride 98  96 - 112 mEq/L   CO2 27  19 - 32 mEq/L   Glucose, Bld 161 (*) 70 - 99 mg/dL   BUN 21  6 - 23 mg/dL   Creatinine, Ser 0.70  0.50 - 1.10 mg/dL   Calcium 9.3  8.4 - 10.5 mg/dL   Total Protein 7.1  6.0 - 8.3 g/dL   Albumin 3.7  3.5 - 5.2 g/dL   AST 631 (*) 0 - 37 U/L   ALT 447 (*) 0 - 35 U/L   Alkaline Phosphatase 145 (*) 39 - 117 U/L   Total Bilirubin 3.3 (*) 0.3 - 1.2 mg/dL   GFR calc non Af Amer 90 (*) >90 mL/min   GFR calc Af Amer >90  >90 mL/min   Comment:            The eGFR has been  calculated     using the CKD EPI equation.     This calculation has not been     validated in all clinical     situations.     eGFR's persistently     <90 mL/min signify     possible Chronic Kidney Disease.  CBC WITH DIFFERENTIAL     Status: Abnormal   Collection Time    09/03/12  9:40 PM      Result Value Range   WBC 12.6 (*) 4.0 - 10.5 K/uL   RBC 4.57  3.87 - 5.11 MIL/uL   Hemoglobin 15.4 (*) 12.0 - 15.0 g/dL   HCT 42.8  36.0 - 46.0 %   MCV 93.7  78.0 - 100.0 fL   MCH 33.7  26.0 - 34.0 pg   MCHC 36.0  30.0 - 36.0 g/dL   RDW 13.0  11.5 - 15.5 %   Platelets 315  150 - 400 K/uL   Neutrophils Relative % 84 (*) 43 - 77 %   Neutro Abs 10.6 (*) 1.7 - 7.7 K/uL   Lymphocytes Relative 8 (*) 12 - 46 %   Lymphs Abs 1.0  0.7 - 4.0 K/uL   Monocytes Relative 8  3 - 12 %   Monocytes Absolute 0.9  0.1 - 1.0 K/uL   Eosinophils Relative 0  0 - 5 %   Eosinophils Absolute 0.0  0.0 - 0.7 K/uL   Basophils Relative 0  0 - 1 %   Basophils Absolute 0.0  0.0 - 0.1 K/uL  LIPASE, BLOOD     Status: None   Collection Time    09/03/12  9:40 PM      Result Value Range   Lipase 22  11 - 59 U/L  PROTIME-INR     Status: Abnormal   Collection Time    09/03/12  9:40 PM      Result Value Range   Prothrombin Time 18.5 (*) 11.6 - 15.2 seconds   INR 1.59 (*) 0.00 - 1.49  LACTIC ACID, PLASMA     Status: None   Collection Time    09/03/12  9:45 PM      Result Value Range   Lactic Acid, Venous 1.2  0.5 - 2.2 mmol/L  CULTURE, BLOOD (ROUTINE X 2)     Status: None   Collection Time    09/04/12 12:01 AM      Result Value Range   Specimen Description BLOOD RIGHT ARM     Special Requests BOTTLES DRAWN AEROBIC AND ANAEROBIC 10CC EACH     Culture  Setup Time 09/04/2012 09:20     Culture       Value:        BLOOD CULTURE RECEIVED NO GROWTH TO DATE CULTURE WILL BE HELD FOR 5 DAYS BEFORE ISSUING A FINAL NEGATIVE REPORT   Report Status PENDING    CULTURE, BLOOD (ROUTINE X 2)     Status: None   Collection Time     09/04/12 12:03 AM      Result Value Range   Specimen Description BLOOD LEFT ARM     Special Requests       Value: BOTTLES DRAWN AEROBIC AND ANAEROBIC 10CC AER 5CC ANA   Culture  Setup Time 09/04/2012 09:20     Culture       Value:        BLOOD CULTURE RECEIVED NO GROWTH TO DATE CULTURE WILL BE HELD FOR 5 DAYS BEFORE ISSUING A FINAL NEGATIVE REPORT   Report Status PENDING    GLUCOSE, CAPILLARY     Status: Abnormal   Collection Time    09/04/12  2:20 AM      Result Value Range   Glucose-Capillary 141 (*) 70 - 99 mg/dL  GLUCOSE, CAPILLARY     Status: Abnormal   Collection Time    09/04/12  7:36 AM      Result Value Range  Glucose-Capillary 183 (*) 70 - 99 mg/dL   Comment 1 Notify RN    HEMOGLOBIN A1C     Status: Abnormal   Collection Time    09/04/12  7:42 AM      Result Value Range   Hemoglobin A1C 6.9 (*) <5.7 %   Comment: (NOTE)                                                                               According to the ADA Clinical Practice Recommendations for 2011, when     HbA1c is used as a screening test:      >=6.5%   Diagnostic of Diabetes Mellitus               (if abnormal result is confirmed)     5.7-6.4%   Increased risk of developing Diabetes Mellitus     References:Diagnosis and Classification of Diabetes Mellitus,Diabetes     DEYC,1448,18(HUDJS 1):S62-S69 and Standards of Medical Care in             Diabetes - 2011,Diabetes Care,2011,34 (Suppl 1):S11-S61.   Mean Plasma Glucose 151 (*) <117 mg/dL  GLUCOSE, CAPILLARY     Status: Abnormal   Collection Time    09/04/12 11:58 AM      Result Value Range   Glucose-Capillary 122 (*) 70 - 99 mg/dL   Comment 1 Notify RN    HEPATITIS B SURFACE ANTIGEN     Status: None   Collection Time    09/04/12  1:45 PM      Result Value Range   Hepatitis B Surface Ag NEGATIVE  NEGATIVE  HEPATITIS C ANTIBODY     Status: None   Collection Time    09/04/12  1:45 PM      Result Value Range   HCV Ab NEGATIVE  NEGATIVE  HIV  ANTIBODY (ROUTINE TESTING)     Status: None   Collection Time    09/04/12  1:45 PM      Result Value Range   HIV NON REACTIVE  NON REACTIVE  URINE RAPID DRUG SCREEN (HOSP PERFORMED)     Status: None   Collection Time    09/04/12  3:30 PM      Result Value Range   Opiates NONE DETECTED  NONE DETECTED   Cocaine NONE DETECTED  NONE DETECTED   Benzodiazepines NONE DETECTED  NONE DETECTED   Amphetamines NONE DETECTED  NONE DETECTED   Tetrahydrocannabinol NONE DETECTED  NONE DETECTED   Barbiturates NONE DETECTED  NONE DETECTED   Comment:            DRUG SCREEN FOR MEDICAL PURPOSES     ONLY.  IF CONFIRMATION IS NEEDED     FOR ANY PURPOSE, NOTIFY LAB     WITHIN 5 DAYS.                LOWEST DETECTABLE LIMITS     FOR URINE DRUG SCREEN     Drug Class       Cutoff (ng/mL)     Amphetamine      1000     Barbiturate      200     Benzodiazepine   200  Tricyclics       876     Opiates          300     Cocaine          300     THC              50  GLUCOSE, CAPILLARY     Status: Abnormal   Collection Time    09/04/12  3:58 PM      Result Value Range   Glucose-Capillary 157 (*) 70 - 99 mg/dL   Comment 1 Notify RN    GLUCOSE, CAPILLARY     Status: Abnormal   Collection Time    09/04/12  7:21 PM      Result Value Range   Glucose-Capillary 154 (*) 70 - 99 mg/dL  GLUCOSE, CAPILLARY     Status: Abnormal   Collection Time    09/04/12 11:47 PM      Result Value Range   Glucose-Capillary 100 (*) 70 - 99 mg/dL  GLUCOSE, CAPILLARY     Status: Abnormal   Collection Time    09/05/12  3:38 AM      Result Value Range   Glucose-Capillary 150 (*) 70 - 99 mg/dL  CBC     Status: None   Collection Time    09/05/12  7:05 AM      Result Value Range   WBC 7.1  4.0 - 10.5 K/uL   RBC 3.87  3.87 - 5.11 MIL/uL   Hemoglobin 13.1  12.0 - 15.0 g/dL   HCT 36.6  36.0 - 46.0 %   MCV 94.6  78.0 - 100.0 fL   MCH 33.9  26.0 - 34.0 pg   MCHC 35.8  30.0 - 36.0 g/dL   RDW 13.5  11.5 - 15.5 %   Platelets  270  150 - 400 K/uL  PROTIME-INR     Status: Abnormal   Collection Time    09/05/12  7:05 AM      Result Value Range   Prothrombin Time 17.1 (*) 11.6 - 15.2 seconds   INR 1.43  0.00 - 1.49  COMPREHENSIVE METABOLIC PANEL     Status: Abnormal   Collection Time    09/05/12  7:05 AM      Result Value Range   Sodium 140  135 - 145 mEq/L   Potassium 3.6  3.5 - 5.1 mEq/L   Chloride 103  96 - 112 mEq/L   CO2 23  19 - 32 mEq/L   Glucose, Bld 123 (*) 70 - 99 mg/dL   BUN 6  6 - 23 mg/dL   Creatinine, Ser 0.76  0.50 - 1.10 mg/dL   Calcium 8.9  8.4 - 10.5 mg/dL   Total Protein 6.0  6.0 - 8.3 g/dL   Albumin 3.0 (*) 3.5 - 5.2 g/dL   AST 126 (*) 0 - 37 U/L   ALT 199 (*) 0 - 35 U/L   Alkaline Phosphatase 123 (*) 39 - 117 U/L   Total Bilirubin 2.1 (*) 0.3 - 1.2 mg/dL   GFR calc non Af Amer 87 (*) >90 mL/min   GFR calc Af Amer >90  >90 mL/min   Comment:            The eGFR has been calculated     using the CKD EPI equation.     This calculation has not been     validated in all clinical     situations.  eGFR's persistently     <90 mL/min signify     possible Chronic Kidney Disease.  GLUCOSE, CAPILLARY     Status: Abnormal   Collection Time    09/05/12  7:57 AM      Result Value Range   Glucose-Capillary 124 (*) 70 - 99 mg/dL   Comment 1 Notify RN      Ct Abdomen Pelvis W Contrast  09/03/2012   *RADIOLOGY REPORT*  Clinical Data: Right-sided abdominal pain, nausea and diarrhea.  CT ABDOMEN AND PELVIS WITH CONTRAST  Technique:  Multidetector CT imaging of the abdomen and pelvis was performed following the standard protocol during bolus administration of intravenous contrast.  Contrast: 61m OMNIPAQUE IOHEXOL 300 MG/ML  SOLN, 1084mOMNIPAQUE IOHEXOL 300 MG/ML  SOLN  Comparison: 02/16/2011.  Findings: The lung bases are clear except for dependent bibasilar atelectasis.  There are massive esophageal varices noted.  Stable cirrhotic changes involving the liver with cavernous transformation of  the portal vein and massive portal venous collaterals.  The spleen is surgically absent.  Stable biliary air likely due to prior sphincterotomy.  The gallbladder is surgically absent.  The pancreas is unremarkable.  The adrenal glands and kidneys are normal and stable.  No focal hepatic lesion.  Mild stable biliary dilatation.  Stable scattered mesenteric and retroperitoneal lymph nodes but no mass or adenopathy.  The stomach, duodenum, small bowel and colon are grossly normal. The appendix is normal.  No inflammatory changes or mass lesions involving the bowel.  The aorta is normal in caliber.  The major branch vessels are patent.  Stable far left lateral anterior abdominal wall hernia containing fat.  The uterus is surgically absent.  The bladder is normal.  No pelvic mass or adenopathy.  No significant free pelvic fluid collections. No inguinal mass or hernia.  The bony structures are intact.  IMPRESSION:  1.  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 2.  Status post splenectomy and cholecystectomy. 3.  Stable mild biliary dilatation and biliary air. 4.  No acute abdominal/pelvic findings, mass lesions or lymphadenopathy.   Original Report Authenticated By: P.Marijo SanesM.D.               Blood pressure 126/56, pulse 60, temperature 98.6 F (37 C), temperature source Oral, resp. rate 18, height 5' 4"  (1.626 m), weight 70.3 kg (154 lb 15.7 oz), SpO2 94.00%.  Physical exam:   General-- alert and oriented and in no distress. She is nonicteric  Heart-- regular rate and rhythm without murmurs are gallops  Lungs-- clear  Abdomen--Soft nontender with good bowel sounds    Assessment:  1. Acute right-sided abdominal pain/fever. A cause of this is unclear. Based on her past history she could have retained common duct stones although the CT does show air in bile duct indicating that the opening appears to  be intact. 2. Hypercoagulable state. Due to antiphospholipid/anticardiolipin antibody syndrome. This has resulted in TIAs, small strokes, acute splenic vein thrombosis requiring splenectomy, chronic vein thrombosis resulting in varices without variceal bleed. It is the opinion of hematology and hepatology at the patient needs to be chronically coagulated. She is felt to be at high risk for a multitude a potential problems related to her hypercoagulable state. 3. Status post cholecystectomy, performed open. Has had 2 episodes of common bile duct stones requiring sphincterotomy.  Plan: patient appears to be improving. I think we should do a MRCP to look for  any sign of retaining material and her common bile. Strongly suggests that her anticoagulant should be maintained.    Chaley Castellanos JR,Koltin Wehmeyer L 09/05/2012, 9:41 AM

## 2012-09-06 LAB — COMPREHENSIVE METABOLIC PANEL
ALT: 150 U/L — ABNORMAL HIGH (ref 0–35)
AST: 62 U/L — ABNORMAL HIGH (ref 0–37)
Albumin: 3 g/dL — ABNORMAL LOW (ref 3.5–5.2)
Alkaline Phosphatase: 127 U/L — ABNORMAL HIGH (ref 39–117)
BUN: 5 mg/dL — ABNORMAL LOW (ref 6–23)
CO2: 26 mEq/L (ref 19–32)
Calcium: 9.1 mg/dL (ref 8.4–10.5)
Chloride: 105 mEq/L (ref 96–112)
Creatinine, Ser: 0.68 mg/dL (ref 0.50–1.10)
GFR calc Af Amer: >90 mL/min (ref >90)
GFR calc non Af Amer: >90 mL/min (ref >90)
Glucose, Bld: 119 mg/dL — ABNORMAL HIGH (ref 70–99)
Potassium: 3.2 mEq/L — ABNORMAL LOW (ref 3.5–5.1)
Sodium: 142 mEq/L (ref 135–145)
Total Bilirubin: 1.1 mg/dL (ref 0.3–1.2)
Total Protein: 6.2 g/dL (ref 6.0–8.3)

## 2012-09-06 LAB — GLUCOSE, CAPILLARY
Glucose-Capillary: 130 mg/dL — ABNORMAL HIGH (ref 70–99)
Glucose-Capillary: 123 mg/dL — ABNORMAL HIGH (ref 70–99)
Glucose-Capillary: 133 mg/dL — ABNORMAL HIGH (ref 70–99)
Glucose-Capillary: 178 mg/dL — ABNORMAL HIGH (ref 70–99)
Glucose-Capillary: 195 mg/dL — ABNORMAL HIGH (ref 70–99)

## 2012-09-06 LAB — PROTIME-INR
INR: 1.65 — ABNORMAL HIGH (ref 0.00–1.49)
Prothrombin Time: 19 seconds — ABNORMAL HIGH (ref 11.6–15.2)

## 2012-09-06 MED ORDER — POTASSIUM CHLORIDE CRYS ER 20 MEQ PO TBCR
40.0000 meq | EXTENDED_RELEASE_TABLET | Freq: Once | ORAL | Status: AC
  Administered 2012-09-06: 40 meq via ORAL
  Filled 2012-09-06: qty 1

## 2012-09-06 MED ORDER — WARFARIN SODIUM 10 MG PO TABS
10.0000 mg | ORAL_TABLET | Freq: Once | ORAL | Status: AC
  Administered 2012-09-06: 10 mg via ORAL
  Filled 2012-09-06 (×2): qty 1

## 2012-09-06 NOTE — Progress Notes (Signed)
[facility] PROGRESS NOTE  LIZVETTE LIGHTSEY VEL:[id number] DOB: 04-29-1945 DOA: 09/03/2012 PCP: Marjorie Smolder, MD  Brief narrative  67 year old female with a history of diabetes mellitus, fibromyalgia, NASH, and antiphospholipid antibody syndrome with portal vein thrombosis and TIAs on chronic Coumadin, gall stones s/p cholecystectomy and 2 ERCPs for CBD stones requiring sphincterotomy and splenectomy in 02 for acute abdominal pain, presents with one-month history of abdominal pain which has worsened in the past 2 days associated with nausea and dry heaves. She also had a fever of 101.108F on 09/02/2012. The patient denies any new medications except for metformin which she started one week ago. She denies taking any NSAIDs or any other supplements over-the-counter. He has not had any recent travels nor is she any raw or undercooked foods. The patient endorses compliance with all her medications included warfarin. The patient was noted to have marked transaminitis with AST 631, ALT 447, alk phosphatase 145 and total bilirubin 3.3. Blood cultures sent and the patient was empirically started on Zosyn.   Assessment/Plan:  Abdominal pain with fever/transaminitis  - hepatitis B surface antigen, HIV , hepatitis C antibody negative,  -CT abdomen without any acute changes to explain abdominal pain . Noted for esophageal varices which per GI is a new finding.  -Lipase normal  appreciate [facility] eval. Recommended MRCP to r/o CBD stone but as patient was claustrophobic and we could not sedate her as she would need to follow instructions during MRCP, the test was aborted.   Plan to monitor renal function and symptoms with advanced diet. If symptoms worsen plan for EUS with ERCP and sphincterotomy. Pain improved. LFTs improving . zofran prn for nausea.  Fever with Leukocytosis  -Urinalysis does not suggest UTI  - Continue empiric antibiotics . Afebrile now and leucocytosis  resolved -Blood cultures so  far negative -use tylenol with caution given transaminitis   Antiphospholipid antibody syndrome  -Continue Coumadin  -INR subtherapeutic. Bridging with therapeutic lovenox   Diabetes mellitus type 2  -Discontinue metformin ( was started only 1 week back)  -NovoLog sliding scale  -Hemoglobin A1c 6.9   Antibiotics:  Zosyn 09/03/12>>>  Vanc 09/04/12>>>   Code Status: full  Family Communication: none at bedside  Disposition Plan:home once stable   Consultants:  [facility]   Procedures:  None    HPI/Subjective:  Patient seen and examined this morning. denies any abdominal pain.    Objective: Filed Vitals:   09/05/12 1357 09/05/12 2137 09/06/12 0506 09/06/12 1433  BP: 144/70 134/70 130/62 135/75  Pulse: 65 61 58 74  Temp: 97.9 F (36.6 C) 98.4 F (36.9 C) 97.6 F (36.4 C) 98 F (36.7 C)  TempSrc: Oral Oral Oral Oral  Resp: 18 18 18 19   Height:      Weight:      SpO2: 97% 98% 95% 98%    Intake/Output Summary (Last 24 hours) at 09/06/12 1641 Last data filed at 09/06/12 0509  Gross per 24 hour  Intake 6204.17 ml  Output   1100 ml  Net 5104.17 ml   Filed Weights   09/03/12 1952 09/04/12 0205  Weight: 67.586 kg (149 lb) 70.3 kg (154 lb 15.7 oz)    Exam:  General: Middle aged female in NAD  HEENT: no pallor , moist oral mucosa  Cardiovascular: N S1&S2, no murmurs  Respiratory: clear b/l , no added sounds  Abdomen: soft, NT, ND, BS+  Musculoskeletal: warm, no edema  CNS: AAOX3   Data Reviewed: Basic Metabolic Panel:  Recent Labs Lab 09/03/12 2140 09/05/12 0705 09/06/12 0535  NA 137 140 142  K 3.7 3.6 3.2*  CL 98 103 105  CO2 27 23 26   GLUCOSE 161* 123* 119*  BUN 21 6 5*  CREATININE 0.70 0.76 0.68  CALCIUM 9.3 8.9 9.1   Liver Function Tests:  Recent Labs Lab 09/03/12 2140 09/05/12 0705 09/06/12 0535  AST 631* 126* 62*  ALT 447* 199* 150*  ALKPHOS 145* 123* 127*  BILITOT 3.3* 2.1* 1.1  PROT 7.1 6.0 6.2  ALBUMIN 3.7 3.0* 3.0*     Recent Labs Lab 09/03/12 2140  LIPASE 22   No results found for this basename: AMMONIA,  in the last 168 hours CBC:  Recent Labs Lab 09/03/12 2140 09/05/12 0705  WBC 12.6* 7.1  NEUTROABS 10.6*  --   HGB 15.4* 13.1  HCT 42.8 36.6  MCV 93.7 94.6  PLT 315 270   Cardiac Enzymes: No results found for this basename: CKTOTAL, CKMB, CKMBINDEX, TROPONINI,  in the last 168 hours BNP (last 3 results) No results found for this basename: PROBNP,  in the last 8760 hours CBG:  Recent Labs Lab 09/05/12 2339 09/06/12 0415 09/06/12 0803 09/06/12 1201 09/06/12 1558  GLUCAP 104* 130* 123* 133* 178*    Recent Results (from the past 240 hour(s))  URINE CULTURE     Status: None   Collection Time    09/03/12  7:48 PM      Result Value Range Status   Specimen Description URINE, CLEAN CATCH   Final   Special Requests Immunocompromised   Final   Culture  Setup Time 09/04/2012 09:33   Final   Colony Count 6,000 COLONIES/ML   Final   Culture INSIGNIFICANT GROWTH   Final   Report Status 09/05/2012 FINAL   Final  CULTURE, BLOOD (ROUTINE X 2)     Status: None   Collection Time    09/04/12 12:01 AM      Result Value Range Status   Specimen Description BLOOD RIGHT ARM   Final   Special Requests BOTTLES DRAWN AEROBIC AND ANAEROBIC 10CC EACH   Final   Culture  Setup Time 09/04/2012 09:20   Final   Culture     Final   Value:        BLOOD CULTURE RECEIVED NO GROWTH TO DATE CULTURE WILL BE HELD FOR 5 DAYS BEFORE ISSUING A FINAL NEGATIVE REPORT   Report Status PENDING   Incomplete  CULTURE, BLOOD (ROUTINE X 2)     Status: None   Collection Time    09/04/12 12:03 AM      Result Value Range Status   Specimen Description BLOOD LEFT ARM   Final   Special Requests     Final   Value: BOTTLES DRAWN AEROBIC AND ANAEROBIC 10CC AER 5CC ANA   Culture  Setup Time 09/04/2012 09:20   Final   Culture     Final   Value:        BLOOD CULTURE RECEIVED NO GROWTH TO DATE CULTURE WILL BE HELD FOR 5 DAYS  BEFORE ISSUING A FINAL NEGATIVE REPORT   Report Status PENDING   Incomplete     Studies: No results found.  Scheduled Meds: . enoxaparin (LOVENOX) injection  1 mg/kg Subcutaneous Q12H  . gadobenate dimeglumine  17 mL Intravenous Once  . insulin aspart  0-9 Units Subcutaneous Q4H  . piperacillin-tazobactam (ZOSYN)  IV  3.375 g Intravenous Q8H  . vancomycin  1,000 mg Intravenous Q12H  .  warfarin  10 mg Oral ONCE-1800  . Warfarin - Pharmacist Dosing Inpatient   Does not apply q1800   Continuous Infusions: . sodium chloride 75 mL/hr at 09/06/12 0221       Time spent: 25 minutes    Chyna Kneece, [facility] Pager [phone] If 7PM-7AM, please contact night-coverage at [email or web address], password [facility] 09/06/2012, 4:41 PM  LOS: 3 days

## 2012-09-06 NOTE — Progress Notes (Signed)
[facility] PROGRESS NOTE Subjective the patient did not have MRCP because she cannot tolerate being in the scanner without sedation and following directions. She reports that her pain is much better. It seems to have moved to her lower abdomen. Unrelated to eating or bowel movements. She reports that she is much better since coming into the hospital  Objective: Vital signs in last 24 hours: Temp:  [97.6 F (36.4 C)-98.4 F (36.9 C)] 97.6 F (36.4 C) (05/23 0506) Pulse Rate:  [58-65] 58 (05/23 0506) Resp:  [18] 18 (05/23 0506) BP: (130-144)/(62-70) 130/62 mmHg (05/23 0506) SpO2:  [95 %-98 %] 95 % (05/23 0506) Last BM Date: 09/05/12  Intake/Output from previous day: 05/22 0701 - 05/23 0700 In: 6454.2 [I.V.:5954.2; IV Piggyback:500] Out: 1100 [Urine:1100] Intake/Output this shift:    PE: General-- patient in good spirits and in no distress  Abdomen-- none distended soft and nontender.  Lab Results:  Recent Labs  09/03/12 2140 09/05/12 0705  WBC 12.6* 7.1  HGB 15.4* 13.1  HCT 42.8 36.6  PLT 315 270   BMET  Recent Labs  09/03/12 2140 09/05/12 0705 09/06/12 0535  NA 137 140 142  K 3.7 3.6 3.2*  CL 98 103 105  CO2 27 23 26   CREATININE 0.70 0.76 0.68   LFT  Recent Labs  09/03/12 2140 09/05/12 0705 09/06/12 0535  PROT 7.1 6.0 6.2  AST 631* 126* 62*  ALT 447* 199* 150*  ALKPHOS 145* 123* 127*  BILITOT 3.3* 2.1* 1.1   PT/INR  Recent Labs  09/03/12 2140 09/05/12 0705 09/06/12 0535  LABPROT 18.5* 17.1* 19.0*  INR 1.59* 1.43 1.65*   PANCREAS  Recent Labs  09/03/12 2140  LIPASE 22         Studies/Results: No results found.  Medications: I have reviewed the patient's current medications.  Assessment/Plan: 1. Right-sided abdominal pain. The cause of this is completely unclear. 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 This is  essentially unchanged. Her LFT's bumped up and have improved. Patient is unable to tolerate MRCP. She is clearly better. We will advance her diet and follow her LFTs in the morning. If there is still concern/about common duct stone she will likely need EUS with propofol anesthesia with ERCP and sphincterotomy if needed   Iran Kievit JR,Melysa Schroyer L 09/06/2012, 8:27 AM

## 2012-09-06 NOTE — Progress Notes (Signed)
ANTICOAGULATION CONSULT NOTE - Follow Up Consult  Pharmacy Consult for Coumadin Indication: Antiphospholipid antibody syndrome  Allergies  Allergen Reactions  . Caine-1 (Lidocaine Hcl) Anaphylaxis  . Codeine Nausea And Vomiting  . Dilaudid (Hydromorphone Hcl) Nausea And Vomiting    Can take with zofran  . Morphine And Related Nausea And Vomiting  . Percocet (Oxycodone-Acetaminophen) Nausea And Vomiting  . Tramadol Nausea And Vomiting  . Vicodin (Hydrocodone-Acetaminophen) Nausea And Vomiting  . Warfarin And Related Other (See Comments)    Generic warfarin is ineffective, patient uses NameBrand Coumadin only    Patient Measurements: Height: 5' 4"  (162.6 cm) Weight: 154 lb 15.7 oz (70.3 kg) IBW/kg (Calculated) : 54.7  Vital Signs: Temp: 97.6 F (36.4 C) (05/23 0506) Temp src: Oral (05/23 0506) BP: 130/62 mmHg (05/23 0506) Pulse Rate: 58 (05/23 0506)  Labs:  Recent Labs  09/03/12 2140 09/05/12 0705 09/06/12 0535  HGB 15.4* 13.1  --   HCT 42.8 36.6  --   PLT 315 270  --   LABPROT 18.5* 17.1* 19.0*  INR 1.59* 1.43 1.65*  CREATININE 0.70 0.76 0.68    Estimated Creatinine Clearance: 68.3 ml/min (by C-G formula based on Cr of 0.68).  Assessment: Ms. 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 Noted she is on Coumadin chronically, and continues while she is admitted. INR was sub-therapeutic on admit (patient reportedly took her last dose on 5/19, it appears she missed a dose on 5/20), but now appears to be trending up slowly with boosted Coumadin doses. Noted MD started full dose Lovenox 5/22. H/H and plts are stable as of 5/22, no overt bleeding reported.  Goal of Therapy:  INR 2-3 Monitor platelets by anticoagulation protocol: Yes   Plan:  - Repeat boosted dose of Coumadin 85m PO x 1 again today - Will continue Lovenox until INR > 2 - Will f/up daily PT/INR  Thanks, Damarco Keysor K. PPosey Pronto PharmD, BCPS.  Clinical Pharmacist Pager [phone] 09/06/2012 7:49  AM

## 2012-09-07 DIAGNOSIS — R7401 Elevation of levels of liver transaminase levels: Secondary | ICD-10-CM | POA: Diagnosis present

## 2012-09-07 DIAGNOSIS — R509 Fever, unspecified: Secondary | ICD-10-CM

## 2012-09-07 LAB — COMPREHENSIVE METABOLIC PANEL
ALT: 105 U/L — ABNORMAL HIGH (ref 0–35)
AST: 38 U/L — ABNORMAL HIGH (ref 0–37)
Albumin: 3 g/dL — ABNORMAL LOW (ref 3.5–5.2)
Alkaline Phosphatase: 135 U/L — ABNORMAL HIGH (ref 39–117)
BUN: 8 mg/dL (ref 6–23)
CO2: 24 mEq/L (ref 19–32)
Calcium: 9.1 mg/dL (ref 8.4–10.5)
Chloride: 105 mEq/L (ref 96–112)
Creatinine, Ser: 1.01 mg/dL (ref 0.50–1.10)
GFR calc Af Amer: 67 mL/min — ABNORMAL LOW (ref >90)
GFR calc non Af Amer: 58 mL/min — ABNORMAL LOW (ref >90)
Glucose, Bld: 127 mg/dL — ABNORMAL HIGH (ref 70–99)
Potassium: 3.5 mEq/L (ref 3.5–5.1)
Sodium: 143 mEq/L (ref 135–145)
Total Bilirubin: 1 mg/dL (ref 0.3–1.2)
Total Protein: 6.3 g/dL (ref 6.0–8.3)

## 2012-09-07 LAB — GLUCOSE, CAPILLARY
Glucose-Capillary: 111 mg/dL — ABNORMAL HIGH (ref 70–99)
Glucose-Capillary: 122 mg/dL — ABNORMAL HIGH (ref 70–99)
Glucose-Capillary: 144 mg/dL — ABNORMAL HIGH (ref 70–99)
Glucose-Capillary: 174 mg/dL — ABNORMAL HIGH (ref 70–99)

## 2012-09-07 LAB — LIPASE, BLOOD: Lipase: 21 U/L (ref 11–59)

## 2012-09-07 LAB — PROTIME-INR
INR: 1.94 — ABNORMAL HIGH (ref 0.00–1.49)
Prothrombin Time: 21.4 seconds — ABNORMAL HIGH (ref 11.6–15.2)

## 2012-09-07 MED ORDER — CIPROFLOXACIN HCL 500 MG PO TABS: 500.0000 mg | ORAL_TABLET | Freq: Two times a day (BID) | ORAL | Status: DC

## 2012-09-07 MED ORDER — WARFARIN SODIUM 5 MG PO TABS
5.0000 mg | ORAL_TABLET | Freq: Once | ORAL | Status: AC
  Administered 2012-09-07: 5 mg via ORAL
  Filled 2012-09-07: qty 1

## 2012-09-07 NOTE — Progress Notes (Addendum)
Discharge instructions gone over. Prescription given. Home medications gone over. Diet and activity discussed. Patient reminded to make follow up appointments. Reasons to call 911 and my chart discussed. Coumadin therapy discussed. Completion of antibiotics discussed. Patient verbalized understanding of instructions.

## 2012-09-07 NOTE — Progress Notes (Signed)
Patient ID: Laurie Green, female   DOB: Dec 09, 1945, 67 y.o.   MRN: [id number] [facility]: [place] Laurie Green  Laurie Green 67 y.o. 1945/06/27   Subjective: Feels ok. Bloated feeling. No N/V. Tolerating solid food. Denies abdominal pain. Patient wants to go home.  Objective: Vital signs in last 24 hours: Filed Vitals:   09/07/12 0605  BP: 147/69  Pulse: 60  Temp: 98.7 F (37.1 Green)  Resp: 18    Physical Exam: Gen: alert, no acute distress Abd: soft, NT, ND, +BS  Lab Results:  Recent Labs  09/06/12 0535 09/07/12 0640  NA 142 143  K 3.2* 3.5  CL 105 105  CO2 26 24  GLUCOSE 119* 127*  BUN 5* 8  CREATININE 0.68 1.01  CALCIUM 9.1 9.1    Recent Labs  09/06/12 0535 09/07/12 0640  AST 62* 38*  ALT 150* 105*  ALKPHOS 127* 135*  BILITOT 1.1 1.0  PROT 6.2 6.3  ALBUMIN 3.0* 3.0*    Recent Labs  09/05/12 0705  WBC 7.1  HGB 13.1  HCT 36.6  MCV 94.6  PLT 270    Recent Labs  09/06/12 0535 09/07/12 0640  LABPROT 19.0* 21.4*  INR 1.65* 1.94*      Assessment/Plan: 67 yo with stable cirrhosis and portal vein thrombosis on Coumadin. AST/ALT have improved. Slight increase in ALP. I think she is stable from a liver/biliary standpoint to go home on a low fat diet and f/u with Dr. Oletta Lamas in 1-2 weeks. I see no indication for her to stay in the hospital to have an EUS done at this time. Needs LFTs checked as outpt next week. Will sign off. Call if questions.   Laurie Green. 09/07/2012, 11:17 AM

## 2012-09-07 NOTE — Progress Notes (Signed)
ANTICOAGULATION CONSULT NOTE - Follow Up Consult  Pharmacy Consult for Coumadin Indication: Antiphospholipid antibody syndrome  Allergies  Allergen Reactions  . Caine-1 (Lidocaine Hcl) Anaphylaxis  . Codeine Nausea And Vomiting  . Dilaudid (Hydromorphone Hcl) Nausea And Vomiting    Can take with zofran  . Morphine And Related Nausea And Vomiting  . Percocet (Oxycodone-Acetaminophen) Nausea And Vomiting  . Tramadol Nausea And Vomiting  . Vicodin (Hydrocodone-Acetaminophen) Nausea And Vomiting  . Warfarin And Related Other (See Comments)    Generic warfarin is ineffective, patient uses NameBrand Coumadin only    Patient Measurements: Height: 5' 4"  (162.6 cm) Weight: 154 lb 15.7 oz (70.3 kg) IBW/kg (Calculated) : 54.7  Vital Signs: Temp: 98.7 F (37.1 C) (05/24 1346) BP: 129/72 mmHg (05/24 1346) Pulse Rate: 75 (05/24 1346)  Labs:  Recent Labs  09/05/12 0705 09/06/12 0535 09/07/12 0640  HGB 13.1  --   --   HCT 36.6  --   --   PLT 270  --   --   LABPROT 17.1* 19.0* 21.4*  INR 1.43 1.65* 1.94*  CREATININE 0.76 0.68 1.01    Estimated Creatinine Clearance: 54.1 ml/min (by C-G formula based on Cr of 1.01).  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 Noted she is on Coumadin chronically, and continues while she is admitted. INR was sub-therapeutic on admit (patient reportedly took her last dose on 5/19, it appears she missed a dose on 5/20), but trended up with boosted Coumadin doses. Today INR is 1.94- almost at goal. Noted MD started full dose Lovenox 5/22. H/H and plts are stable as of 5/22, no overt bleeding reported.  Goal of Therapy:  INR 2-3 Monitor platelets by anticoagulation protocol: Yes   Plan:  - Coumadin 48m PO x 1 - Will continue Lovenox until INR > 2- likely tomorrow - Will f/up daily PT/INR - will get cbc for tomorrow to make sure there is no drop in H/H/Plts from Lovenox  Edrei Norgaard D. Tanaysha Alkins, PharmD Clinical Pharmacist Pager:  [phone]/24/2014 2:01 PM

## 2012-09-07 NOTE — Discharge Summary (Signed)
Physician Discharge Summary  Laurie Green YTK:[id number] DOB: 1945/07/12 DOA: 09/03/2012  PCP: Marjorie Smolder, MD  Admit date: 09/03/2012 Discharge date: 09/07/2012  Time spent: 40 minutes  Recommendations for Outpatient Follow-up:  Home with outpt follow up with PCP and [facility]. Needs LFTs checked in 1 week Needs INR checked in 3 days  Discharge Diagnoses:   Principal Problem:   Abdominal pain  Active Problems:   Transaminitis   Fever, unspecified   Incisional hernia without mention of obstruction or gangrene   NASH (nonalcoholic steatohepatitis)   Subtherapeutic international normalized ratio (INR)   Diabetes mellitus   Leukocytosis, unspecified   Varices, esophageal   Discharge Condition: fair  Diet recommendation: diabetic  Filed Weights   09/03/12 1952 09/04/12 0205  Weight: 67.586 kg (149 lb) 70.3 kg (154 lb 15.7 oz)    History of present illness:  67 year old female with a history of diabetes mellitus, fibromyalgia, NASH, and antiphospholipid antibody syndrome with portal vein thrombosis and TIAs on chronic Coumadin, gall stones s/p cholecystectomy and 2 ERCPs for CBD stones requiring sphincterotomy and splenectomy in 02 for acute abdominal pain, presents with one-month history of abdominal pain which has worsened in the past 2 days associated with nausea and dry heaves. She also had a fever of 101.36F on 09/02/2012. The patient denies any new medications except for metformin which she started one week ago. She denies taking any NSAIDs or any other supplements over-the-counter. He has not had any recent travels nor is she any raw or undercooked foods. The patient endorses compliance with all her medications included warfarin. The patient was noted to have marked transaminitis with AST 631, ALT 447, alk phosphatase 145 and total bilirubin 3.3. Blood cultures sent and the patient was empirically started on Zosyn.    Hospital Course:  Abdominal pain with  fever/transaminitis  - hepatitis B surface antigen, HIV , hepatitis C antibody negative,  -CT abdomen without any acute changes to explain abdominal pain . Noted for esophageal varices which per GI is a new finding.  -Lipase normal  appreciate [facility] eval. Recommended MRCP to r/o CBD stone but as patient was claustrophobic and we could not sedate her as she would need to follow instructions during MRCP, the test was aborted. Pain improved. Liver function monitored and slowly improving. Gi recommend to discharge with outpatient follow up. Does not need ERCP as present.  Fever with Leukocytosis  -Urinalysis does not suggest UTI  - Continue empiric antibiotics . Afebrile now and leucocytosis resolved . Possible for acute cholangitis -Blood cultures so far negative  -will discharge her on oral ciprofloxacin to complete a 7 day course of antibiotics.   Antiphospholipid antibody syndrome  -Continue Coumadin  -INR subtherapeutic. Bridging with therapeutic lovenox . INR is 1.94. Patient does not want to to be discharged on  lovenox for bridging . Since she is started on ciprofloxacin, her INR will likely be elevated . Will discharge her on home dose of coumadin and have her follow up with PCP for INR check in 3 days.   Diabetes mellitus type 2  -resume metformin on discharge -Hemoglobin A1c 6.9   Antibiotics:  Zosyn 09/03/12>>>  Vanc 09/04/12>>>  Discharge on oral cipro for 4 days  Discharge diet: low fat diet  Code Status: full   Consultants:  [facility]   Procedures:  None   Discharge Exam: Filed Vitals:   09/06/12 0506 09/06/12 1433 09/06/12 2145 09/07/12 0605  BP: 130/62 135/75 143/71 147/69  Pulse: 58  74 67 60  Temp: 97.6 F (36.4 C) 98 F (36.7 C) 99 F (37.2 C) 98.7 F (37.1 C)  TempSrc: Oral Oral Oral   Resp: 18 19 18 18   Height:      Weight:      SpO2: 95% 98% 95% 93%      Discharge Instructions   Future Appointments Provider Department Dept Phone    09/17/2012 9:50 AM Tommy Medal, [place] [phone]   09/18/2012 10:30 AM Gwendolyn Benton [phone]   09/18/2012 11:00 AM [name], MD [place] [phone]       Medication List    TAKE these medications       azelastine 137 MCG/SPRAY nasal spray  Commonly known as:  ASTELIN  Place 1 spray into the nose as needed.     benazepril 20 MG tablet  Commonly known as:  LOTENSIN  Take 20 mg by mouth daily.     ciprofloxacin 500 MG tablet  Commonly known as:  CIPRO  Take 1 tablet (500 mg total) by mouth 2 (two) times daily.     COUMADIN 7.5 MG tablet  Generic drug:  warfarin  TAKE 1 TABLET BY MOUTH EVERY DAY     DIGESTIVE ENZYMES PO  Take 1 capsule by mouth daily. Astaxanthin     glucose 4 GM chewable tablet  Chew 16 g by mouth as needed for low blood sugar.     metFORMIN 500 MG tablet  Commonly known as:  GLUCOPHAGE  Take 500 mg by mouth daily.     omeprazole 20 MG tablet  Commonly known as:  PRILOSEC OTC  Take 20 mg by mouth daily as needed (acid reflux).     propranolol 80 MG tablet  Commonly known as:  INDERAL  Take 80 mg by mouth daily.     Vitamin D2 2000 UNITS Tabs  Take 1 tablet by mouth daily.       Allergies  Allergen Reactions  . Caine-1 (Lidocaine Hcl) Anaphylaxis  . Codeine Nausea And Vomiting  . Dilaudid (Hydromorphone Hcl) Nausea And Vomiting    Can take with zofran  . Morphine And Related Nausea And Vomiting  . Percocet (Oxycodone-Acetaminophen) Nausea And Vomiting  . Tramadol Nausea And Vomiting  . Vicodin (Hydrocodone-Acetaminophen) Nausea And Vomiting  . Warfarin And Related Other (See Comments)    Generic warfarin is ineffective, patient uses NameBrand Coumadin only       Follow-up Information   Follow up with Marjorie Smolder, MD In 1 week. (needs LFTs checked)    Contact information:   [place] [address] [phone]       Follow up with EDWARDS JR,JAMES L, MD. Schedule an appointment as soon as possible for a visit in 2 weeks.   Contact information:   [address] [place] [address] [phone]        The results of significant diagnostics from this hospitalization (including imaging, microbiology, ancillary and laboratory) are listed below for reference.    Significant Diagnostic Studies: Ct Abdomen Pelvis W Contrast  09/03/2012   *RADIOLOGY REPORT*  Clinical Data: Right-sided abdominal pain, nausea and diarrhea.  CT ABDOMEN AND PELVIS WITH CONTRAST  Technique:  Multidetector CT imaging  of the abdomen and pelvis was performed following the standard protocol during bolus administration of intravenous contrast.  Contrast: 20m OMNIPAQUE IOHEXOL 300 MG/ML  SOLN, 1069mOMNIPAQUE IOHEXOL 300 MG/ML  SOLN  Comparison: 02/16/2011.  Findings: The lung bases are clear except for dependent bibasilar atelectasis.  There are massive esophageal varices noted.  Stable cirrhotic changes involving the liver with cavernous transformation of the portal vein and massive portal venous collaterals.  The spleen is surgically absent.  Stable biliary air likely due to prior sphincterotomy.  The gallbladder is surgically absent.  The pancreas is unremarkable.  The adrenal glands and kidneys are normal and stable.  No focal hepatic lesion.  Mild stable biliary dilatation.  Stable scattered mesenteric and retroperitoneal lymph nodes but no mass or adenopathy.  The stomach, duodenum, small bowel and colon are grossly normal. The appendix is normal.  No inflammatory changes or mass lesions involving the bowel.  The aorta is normal in caliber.  The major branch vessels are patent.  Stable far left lateral anterior abdominal wall hernia containing fat.  The uterus is surgically absent.  The bladder is normal.  No pelvic mass or adenopathy.  No  significant free pelvic fluid collections. No inguinal mass or hernia.  The bony structures are intact.  IMPRESSION:  1.  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 2.  Status post splenectomy and cholecystectomy. 3.  Stable mild biliary dilatation and biliary air. 4.  No acute abdominal/pelvic findings, mass lesions or lymphadenopathy.   Original Report Authenticated By: P.Marijo SanesM.D.    Microbiology: Recent Results (from the past 240 hour(s))  URINE CULTURE     Status: None   Collection Time    09/03/12  7:48 PM      Result Value Range Status   Specimen Description URINE, CLEAN CATCH   Final   Special Requests Immunocompromised   Final   Culture  Setup Time 09/04/2012 09:33   Final   Colony Count 6,000 COLONIES/ML   Final   Culture INSIGNIFICANT GROWTH   Final   Report Status 09/05/2012 FINAL   Final  CULTURE, BLOOD (ROUTINE X 2)     Status: None   Collection Time    09/04/12 12:01 AM      Result Value Range Status   Specimen Description BLOOD RIGHT ARM   Final   Special Requests BOTTLES DRAWN AEROBIC AND ANAEROBIC 10CC EACH   Final   Culture  Setup Time 09/04/2012 09:20   Final   Culture     Final   Value:        BLOOD CULTURE RECEIVED NO GROWTH TO DATE CULTURE WILL BE HELD FOR 5 DAYS BEFORE ISSUING A FINAL NEGATIVE REPORT   Report Status PENDING   Incomplete  CULTURE, BLOOD (ROUTINE X 2)     Status: None   Collection Time    09/04/12 12:03 AM      Result Value Range Status   Specimen Description BLOOD LEFT ARM   Final   Special Requests     Final   Value: BOTTLES DRAWN AEROBIC AND ANAEROBIC 10CC AER 5CC ANA   Culture  Setup Time 09/04/2012 09:20   Final   Culture     Final   Value:        BLOOD CULTURE RECEIVED NO GROWTH TO DATE CULTURE WILL BE HELD FOR 5 DAYS BEFORE ISSUING A FINAL NEGATIVE REPORT   Report Status PENDING  Incomplete     Labs: Basic Metabolic Panel:  Recent  Labs Lab 09/03/12 2140 09/05/12 0705 09/06/12 0535 09/07/12 0640  NA 137 140 142 143  K 3.7 3.6 3.2* 3.5  CL 98 103 105 105  CO2 27 23 26 24   GLUCOSE 161* 123* 119* 127*  BUN 21 6 5* 8  CREATININE 0.70 0.76 0.68 1.01  CALCIUM 9.3 8.9 9.1 9.1   Liver Function Tests:  Recent Labs Lab 09/03/12 2140 09/05/12 0705 09/06/12 0535 09/07/12 0640  AST 631* 126* 62* 38*  ALT 447* 199* 150* 105*  ALKPHOS 145* 123* 127* 135*  BILITOT 3.3* 2.1* 1.1 1.0  PROT 7.1 6.0 6.2 6.3  ALBUMIN 3.7 3.0* 3.0* 3.0*    Recent Labs Lab 09/03/12 2140 09/07/12 0640  LIPASE 22 21   No results found for this basename: AMMONIA,  in the last 168 hours CBC:  Recent Labs Lab 09/03/12 2140 09/05/12 0705  WBC 12.6* 7.1  NEUTROABS 10.6*  --   HGB 15.4* 13.1  HCT 42.8 36.6  MCV 93.7 94.6  PLT 315 270   Cardiac Enzymes: No results found for this basename: CKTOTAL, CKMB, CKMBINDEX, TROPONINI,  in the last 168 hours BNP: BNP (last 3 results) No results found for this basename: PROBNP,  in the last 8760 hours CBG:  Recent Labs Lab 09/06/12 2012 09/07/12 0015 09/07/12 0428 09/07/12 0803 09/07/12 1204  GLUCAP 195* 111* 144* 122* 174*       Signed:  Sherrye Puga  [facility] 09/07/2012, 1:27 PM

## 2012-09-10 LAB — CULTURE, BLOOD (ROUTINE X 2)
Culture: NO GROWTH
Culture: NO GROWTH

## 2012-09-11 ENCOUNTER — Telehealth: Payer: Self-pay | Admitting: Hematology & Oncology

## 2012-09-11 NOTE — Telephone Encounter (Signed)
Pt left message needing lab, I called back left message for her to call me.

## 2012-09-17 ENCOUNTER — Ambulatory Visit: Payer: BC Managed Care – PPO | Admitting: Pharmacist Clinician (PhC)/ Clinical Pharmacy Specialist

## 2012-09-17 ENCOUNTER — Encounter (HOSPITAL_COMMUNITY): Payer: Self-pay | Admitting: *Deleted

## 2012-09-18 ENCOUNTER — Ambulatory Visit (HOSPITAL_BASED_OUTPATIENT_CLINIC_OR_DEPARTMENT_OTHER): Payer: BC Managed Care – PPO | Admitting: Hematology & Oncology

## 2012-09-18 ENCOUNTER — Other Ambulatory Visit (HOSPITAL_BASED_OUTPATIENT_CLINIC_OR_DEPARTMENT_OTHER): Payer: BC Managed Care – PPO | Admitting: Lab

## 2012-09-18 VITALS — Temp 98.2°F | Ht 64.0 in | Wt 149.0 lb

## 2012-09-18 DIAGNOSIS — E119 Type 2 diabetes mellitus without complications: Secondary | ICD-10-CM

## 2012-09-18 DIAGNOSIS — D6859 Other primary thrombophilia: Secondary | ICD-10-CM

## 2012-09-18 DIAGNOSIS — I82409 Acute embolism and thrombosis of unspecified deep veins of unspecified lower extremity: Secondary | ICD-10-CM

## 2012-09-18 DIAGNOSIS — K7460 Unspecified cirrhosis of liver: Secondary | ICD-10-CM

## 2012-09-18 DIAGNOSIS — Z86718 Personal history of other venous thrombosis and embolism: Secondary | ICD-10-CM

## 2012-09-18 DIAGNOSIS — I82403 Acute embolism and thrombosis of unspecified deep veins of lower extremity, bilateral: Secondary | ICD-10-CM

## 2012-09-18 DIAGNOSIS — K7581 Nonalcoholic steatohepatitis (NASH): Secondary | ICD-10-CM

## 2012-09-18 LAB — CBC WITH DIFFERENTIAL (CANCER CENTER ONLY)
BASO#: 0 10*3/uL (ref 0.0–0.2)
BASO%: 0.5 % (ref 0.0–2.0)
EOS%: 3.5 % (ref 0.0–7.0)
Eosinophils Absolute: 0.3 10*3/uL (ref 0.0–0.5)
HCT: 42.6 % (ref 34.8–46.6)
HGB: 14.9 g/dL (ref 11.6–15.9)
LYMPH#: 1.5 10*3/uL (ref 0.9–3.3)
LYMPH%: 19.8 % (ref 14.0–48.0)
MCH: 33.9 pg (ref 26.0–34.0)
MCHC: 35 g/dL (ref 32.0–36.0)
MCV: 97 fL (ref 81–101)
MONO#: 0.7 10*3/uL (ref 0.1–0.9)
MONO%: 9.5 % (ref 0.0–13.0)
NEUT#: 5.2 10*3/uL (ref 1.5–6.5)
NEUT%: 66.7 % (ref 39.6–80.0)
Platelets: 416 10*3/uL — ABNORMAL HIGH (ref 145–400)
RBC: 4.4 10*6/uL (ref 3.70–5.32)
RDW: 13 % (ref 11.1–15.7)
WBC: 7.7 10*3/uL (ref 3.9–10.0)

## 2012-09-18 LAB — CMP (CANCER CENTER ONLY)
ALT(SGPT): 34 U/L (ref 10–47)
AST: 25 U/L (ref 11–38)
Albumin: 3.7 g/dL (ref 3.3–5.5)
Alkaline Phosphatase: 86 U/L — ABNORMAL HIGH (ref 26–84)
BUN, Bld: 14 mg/dL (ref 7–22)
CO2: 30 mEq/L (ref 18–33)
Calcium: 9.1 mg/dL (ref 8.0–10.3)
Chloride: 101 mEq/L (ref 98–108)
Creat: 0.8 mg/dl (ref 0.6–1.2)
Glucose, Bld: 171 mg/dL — ABNORMAL HIGH (ref 73–118)
Potassium: 3.8 mEq/L (ref 3.3–4.7)
Sodium: 145 mEq/L (ref 128–145)
Total Bilirubin: 1.2 mg/dl (ref 0.20–1.60)
Total Protein: 7.3 g/dL (ref 6.4–8.1)

## 2012-09-18 LAB — PROTIME-INR (CHCC SATELLITE)
INR: 1.8 — ABNORMAL LOW (ref 2.0–3.5)
Protime: 21.6 Seconds — ABNORMAL HIGH (ref 10.6–13.4)

## 2012-09-18 NOTE — Progress Notes (Signed)
This office note has been dictated.

## 2012-09-19 NOTE — Progress Notes (Signed)
CC:   Frann Rider, M.D.  DIAGNOSES: 1. Antiphospholipid antibody syndrome. 2. 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 3. Non-alcoholic steatohepatitis (NASH). 4. Non-insulin-dependent diabetes. 5. Fibromyalgia.  CURRENT THERAPY:  Coumadin 7.5 mg p.o. daily.  INTERIM HISTORY:  Ms. Impastato comes in for followup.  She was hospitalized back in May.  She came in, I think, on 09/03/2012.  She had abdominal pain.  She had nausea and vomiting.  She had some diarrhea. The patient said that she has diarrhea ever since she started the metformin.  While in the hospital she had a CT scan of the abdomen and pelvis.  She had some stable cirrhotic changes.  She had cavernous transformation of the portal vein and portal venous collaterals and esophageal varices. No evidence of malignancy was noted.  She is going to have an upper endoscopy with possible ERCP next week.  I think that she should get off the Coumadin.  I think she should stop her Coumadin about 3 or 4 days before the procedure and restart the day of the procedure.  She does not feel all that well.  She just feels tired.  This may be from medications.  It may be from diabetes and high blood sugars.  PHYSICAL EXAMINATION:  General:  This is a well-developed, well- nourished white female, in no obvious distress.  Vital signs: Temperature of 98.2, pulse 59, respiratory rate 16, blood pressure 126/63.  Weight is 149.  Head and neck:  Normocephalic, atraumatic skull.  There are no ocular or oral lesions.  There are no palpable cervical or supraclavicular lymph nodes.  Lungs:  Clear to percussion and auscultation bilaterally.  Cardiac:  Regular rate and rhythm with a normal S1, S2.  There are no murmurs, rubs, or bruits.  Abdomen:  Soft. She has multiple laparotomy scars.  There is no spleen.  She has no palpable hepatomegaly.  There is no obvious fluid wave.  There is  no palpable hepatomegaly.  Extremities:  Show no clubbing, cyanosis or edema.  Neurological:  Shows no focal neurological deficits.  LABORATORY STUDIES:  White cell count is 7.7, hemoglobin 14.9, hematocrit 42.6, platelet count 416,000.  LFTs are normal.  Glucose is 171.  IMPRESSION:  Ms. Laurie Green is a 67 year old white female with antiphospholipid antibody syndrome.  She has not had a blood clot now for over 12 years.  I am still not sure why she has this abdominal issue.  She does have some cirrhosis.  However, it does not appear to be anything worse than what she has been dealing with.  We will go ahead and plan to get her back in another month.  We will send the medical clearance to Dr. [name] for her to have the upper endoscopy.  I suppose that she may have Helicobacter infection.  If so, this may explain her symptoms, and hopefully treating it will allow her symptoms to resolve.    ______________________________ [name], M.D. PRE/MEDQ  D:  09/18/2012  T:  09/19/2012  Job:  [id number]

## 2012-09-24 ENCOUNTER — Ambulatory Visit: Payer: BC Managed Care – PPO | Admitting: Pharmacist Clinician (PhC)/ Clinical Pharmacy Specialist

## 2012-09-24 ENCOUNTER — Other Ambulatory Visit: Payer: Self-pay | Admitting: Gastroenterology

## 2012-09-24 NOTE — Addendum Note (Signed)
Addended by: Albaraa Swingle on: 09/24/2012 01:32 PM   Modules accepted: Orders  

## 2012-09-25 ENCOUNTER — Ambulatory Visit (HOSPITAL_COMMUNITY)
Admission: RE | Admit: 2012-09-25 | Discharge: 2012-09-25 | Disposition: A | Discharge status: Home / Self Care | Payer: BC Managed Care – PPO | Source: Ambulatory Visit | Attending: Gastroenterology | Admitting: Gastroenterology

## 2012-09-25 ENCOUNTER — Ambulatory Visit (HOSPITAL_COMMUNITY): Payer: BC Managed Care – PPO

## 2012-09-25 ENCOUNTER — Ambulatory Visit (HOSPITAL_COMMUNITY): Payer: BC Managed Care – PPO | Admitting: Certified Registered Nurse Anesthetist

## 2012-09-25 ENCOUNTER — Encounter (HOSPITAL_COMMUNITY): Payer: Self-pay | Admitting: *Deleted

## 2012-09-25 ENCOUNTER — Encounter (HOSPITAL_COMMUNITY)
Admission: RE | Disposition: A | Discharge status: Home / Self Care | Payer: Self-pay | Source: Ambulatory Visit | Attending: Gastroenterology

## 2012-09-25 ENCOUNTER — Encounter (HOSPITAL_COMMUNITY): Payer: Self-pay | Admitting: Certified Registered Nurse Anesthetist

## 2012-09-25 DIAGNOSIS — I8500 Esophageal varices without bleeding: Secondary | ICD-10-CM | POA: Insufficient documentation

## 2012-09-25 DIAGNOSIS — K7689 Other specified diseases of liver: Secondary | ICD-10-CM | POA: Insufficient documentation

## 2012-09-25 DIAGNOSIS — Z86718 Personal history of other venous thrombosis and embolism: Secondary | ICD-10-CM | POA: Insufficient documentation

## 2012-09-25 DIAGNOSIS — K7460 Unspecified cirrhosis of liver: Secondary | ICD-10-CM | POA: Insufficient documentation

## 2012-09-25 DIAGNOSIS — IMO0001 Reserved for inherently not codable concepts without codable children: Secondary | ICD-10-CM | POA: Insufficient documentation

## 2012-09-25 DIAGNOSIS — R7989 Other specified abnormal findings of blood chemistry: Secondary | ICD-10-CM | POA: Insufficient documentation

## 2012-09-25 DIAGNOSIS — R197 Diarrhea, unspecified: Secondary | ICD-10-CM | POA: Insufficient documentation

## 2012-09-25 DIAGNOSIS — R112 Nausea with vomiting, unspecified: Secondary | ICD-10-CM | POA: Insufficient documentation

## 2012-09-25 DIAGNOSIS — E119 Type 2 diabetes mellitus without complications: Secondary | ICD-10-CM | POA: Insufficient documentation

## 2012-09-25 DIAGNOSIS — R109 Unspecified abdominal pain: Secondary | ICD-10-CM | POA: Insufficient documentation

## 2012-09-25 DIAGNOSIS — D6859 Other primary thrombophilia: Secondary | ICD-10-CM | POA: Insufficient documentation

## 2012-09-25 HISTORY — PX: EUS: SHX5427

## 2012-09-25 LAB — GLUCOSE, CAPILLARY: Glucose-Capillary: 185 mg/dL — ABNORMAL HIGH (ref 70–99)

## 2012-09-25 SURGERY — ESOPHAGEAL ENDOSCOPIC ULTRASOUND (EUS) RADIAL
Anesthesia: General

## 2012-09-25 MED ORDER — CIPROFLOXACIN IN D5W 400 MG/200ML IV SOLN
INTRAVENOUS | Status: AC
  Filled 2012-09-25: qty 200

## 2012-09-25 MED ORDER — SODIUM CHLORIDE 0.9 % IV SOLN: INTRAVENOUS | Status: DC

## 2012-09-25 MED ORDER — LACTATED RINGERS IV SOLN
INTRAVENOUS | Status: DC
  Administered 2012-09-25: 1000 mL via INTRAVENOUS

## 2012-09-25 MED ORDER — ONDANSETRON HCL 4 MG/2ML IJ SOLN
INTRAMUSCULAR | Status: DC | PRN
  Administered 2012-09-25: 4 mg via INTRAVENOUS

## 2012-09-25 MED ORDER — MIDAZOLAM HCL 5 MG/5ML IJ SOLN
INTRAMUSCULAR | Status: DC | PRN
  Administered 2012-09-25 (×2): 1 mg via INTRAVENOUS

## 2012-09-25 MED ORDER — FENTANYL CITRATE 0.05 MG/ML IJ SOLN: 25.0000 ug | INTRAMUSCULAR | Status: DC | PRN

## 2012-09-25 MED ORDER — BUTAMBEN-TETRACAINE-BENZOCAINE 2-2-14 % EX AERO
INHALATION_SPRAY | CUTANEOUS | Status: DC | PRN
  Administered 2012-09-25: 2 via TOPICAL

## 2012-09-25 MED ORDER — FENTANYL CITRATE 0.05 MG/ML IJ SOLN
INTRAMUSCULAR | Status: DC | PRN
  Administered 2012-09-25 (×2): 50 ug via INTRAVENOUS

## 2012-09-25 MED ORDER — KETAMINE HCL 10 MG/ML IJ SOLN
INTRAMUSCULAR | Status: DC | PRN
  Administered 2012-09-25: 20 mg via INTRAVENOUS

## 2012-09-25 MED ORDER — PROPOFOL INFUSION 10 MG/ML OPTIME
INTRAVENOUS | Status: DC | PRN
  Administered 2012-09-25: 120 ug/kg/min via INTRAVENOUS

## 2012-09-25 NOTE — Op Note (Signed)
[facility] [place], [address]   ENDOSCOPIC ULTRASOUND PROCEDURE REPORT  PATIENT: Laurie Green, Laurie Green  MR#: [id number] BIRTHDATE: [birth year]  GENDER: Female ENDOSCOPIST: Arta Silence, MD REFERRED BY:  [name], M.D. PROCEDURE DATE:  09/25/2012 PROCEDURE:   Upper EUS ASA CLASS:      Class III INDICATIONS:   1.  abdominal pain, elevated LFTs. MEDICATIONS: Cetacaine spray x 2 and MAC sedation, administered by CRNA  DESCRIPTION OF PROCEDURE:   After the risks benefits and alternatives of the procedure were  explained, informed consent was obtained. The patient was then placed in the left, lateral, decubitus postion and IV sedation was administered. Throughout the procedure, the patients blood pressure, pulse and oxygen saturations were monitored continuously.  Under direct visualization, the Pentax Radial EUS [id number]  endoscope was introduced through the mouth  and advanced to the second portion of the duodenum .  Water was used as necessary to provide an acoustic interface.  Upon completion of the imaging, water was removed and the patient was sent to the recovery room in satisfactory condition.     FINDINGS:      Retained food in stomach, limiting views of dependent-portions of stomach; otherwise grossly normal limited mucosal views of upper GI tract; no significant esophageal varices were seen.  Bile duct seen, was about 42m proximally and about 3102min the intrapancreatic portion.  There was evidence of symmetrical bile duct wall thickening as well as triangular-appearing periportal lymph nodes, both likely reactive.  Evidence of pneumobilia as well.  Extensive periportal vascular collaterals, directly adjacent to, and somewhat compressing in places, the bile duct.  No obvious bile duct stones seen.  Ampulla normal-appearing via EUS.  IMPRESSION:     As above.  No obvious evidence of bile duct stones. Most recent LFTs showed TB 1.4  otherwise normal.  RECOMMENDATIONS:     1.  Watch for potential complications of procedure. 2.  OK to resume Coumadin today. 3.  No indication for ERCP. 4.  Patient should follow-up with Dr. EdOletta Lamasor ongoing management of her abdominal pain.   _______________________________ eSLorrin Mais WiArta SilenceMD 09/25/2012 9:24 AM   CC:

## 2012-09-25 NOTE — Anesthesia Preprocedure Evaluation (Addendum)
Anesthesia Evaluation  Patient identified by MRN, date of birth, ID band Patient awake  General Assessment Comment:.  TIA (transient ischemic attack)     .  Hypertension     .  Anti-cardiolipin antibody syndrome         antiphospholipid antibody syndrome   .  Diverticulitis     .  Portal hypertension     .  Varices, gastric     .  Varices, esophageal     .  Fibromyalgia     .  Celiac disease     .  Diabetes mellitus     .  PONV (postoperative nausea and vomiting)     .  Heart murmur     .  Asthma     .  Blood dyscrasia         anticardiolipid syndrome   .  Stroke         tia   .  Blood transfusion     .  GERD (gastroesophageal reflux disease)     .  Arthritis     .  DVT (deep venous thrombosis)  05/01/2011   .  Varices  05/01/2011   .  NASH (nonalcoholic steatohepatitis)  05/01/2011     Reviewed: Allergy & Precautions, H&P , NPO status , Patient's Chart, lab work & pertinent test results  History of Anesthesia Complications (+) PONV  Airway Mallampati: II TM Distance: >3 FB Neck ROM: Full    Dental no notable dental hx.    Pulmonary asthma ,  breath sounds clear to auscultation  Pulmonary exam normal       Cardiovascular Exercise Tolerance: Good hypertension, Pt. on medications and Pt. on home beta blockers Rhythm:Regular Rate:Normal  ECG: normal.   Neuro/Psych TIA Neuromuscular disease CVA negative psych ROS   GI/Hepatic GERD-  Medicated,(+) Hepatitis -, Unspecified  Endo/Other  negative endocrine ROSdiabetes, Type 2, Oral Hypoglycemic Agents  Renal/GU negative Renal ROS  negative genitourinary   Musculoskeletal  (+) Fibromyalgia -  Abdominal   Peds negative pediatric ROS (+)  Hematology  (+) Blood dyscrasia, ,   Anesthesia Other Findings   Reproductive/Obstetrics negative OB ROS                          Anesthesia Physical Anesthesia Plan  ASA: III  Anesthesia Plan:  General   Post-op Pain Management:    Induction: Intravenous  Airway Management Planned: Oral ETT  Additional Equipment:   Intra-op Plan:   Post-operative Plan: Extubation in OR  Informed Consent: I have reviewed the patients History and Physical, chart, labs and discussed the procedure including the risks, benefits and alternatives for the proposed anesthesia with the patient or authorized representative who has indicated his/her understanding and acceptance.   Dental advisory given  Plan Discussed with: CRNA  Anesthesia Plan Comments:         Anesthesia Quick Evaluation

## 2012-09-25 NOTE — Transfer of Care (Signed)
Immediate Anesthesia Transfer of Care Note  Patient: Laurie Green  Procedure(s) Performed: Procedure(s) (LRB): ESOPHAGEAL ENDOSCOPIC ULTRASOUND (EUS) RADIAL (N/A)  Patient Location: PACU  Anesthesia Type: MAC  Level of Consciousness: sedated, patient cooperative and responds to stimulaton  Airway & Oxygen Therapy: Patient Spontanous Breathing and Patient connected to face mask oxgen  Post-op Assessment: Report given to PACU RN and Post -op Vital signs reviewed and stable  Post vital signs: Reviewed and stable  Complications: No apparent anesthesia complications

## 2012-09-25 NOTE — Interval H&P Note (Signed)
History and Physical Interval Note:  09/25/2012 8:46 AM  Laurie Green  has presented today for surgery, with the diagnosis of sruq pain  The various methods of treatment have been discussed with the patient and family. After consideration of risks, benefits and other options for treatment, the patient has consented to  Procedure(s): ESOPHAGEAL ENDOSCOPIC ULTRASOUND (EUS) RADIAL (N/A) ENDOSCOPIC RETROGRADE CHOLANGIOPANCREATOGRAPHY (ERCP) (N/A) as a surgical intervention .  The patient's history has been reviewed, patient examined, no change in status, stable for surgery.  I have reviewed the patient's chart and labs.  Questions were answered to the patient's satisfaction.     Laurie Green  Assessment:  1.  Upper abdominal pain with elevated LFTs. 2.  Prior history of choledocholithiasis.  Plan:  1.  Endoscopic ultrasound. 2.  If choledocholthiasis is seen, will do endoscopic retrograde cholangiopancreatography. 3.  Risks (bleeding, infection, bowel perforation that could require surgery, sedation-related changes in cardiopulmonary systems), benefits (identification and possible treatment of source of symptoms, exclusion of certain causes of symptoms), and alternatives (watchful waiting, radiographic imaging studies, empiric medical treatment) of upper endoscopy with ultrasound (EGD + EUS) were explained to patient/family in detail and patient wishes to proceed. 4.  Risks (up to and including bleeding, infection, perforation, pancreatitis that can be complicated by infected necrosis and death), benefits (removal of stones, alleviating blockage, decreasing risk of cholangitis or choledocholithiasis-related pancreatitis), and alternatives (watchful waiting, percutaneous transhepatic cholangiography) of ERCP were explained to patient/family in detail and patient elects to proceed.

## 2012-09-25 NOTE — H&P (View-Only) (Signed)
CC:   Laurie Green, M.D.  DIAGNOSES: 1. Antiphospholipid antibody syndrome. 2. 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 3. Non-alcoholic steatohepatitis (NASH). 4. Non-insulin-dependent diabetes. 5. Fibromyalgia.  CURRENT THERAPY:  Coumadin 7.5 mg p.o. daily.  INTERIM HISTORY:  Laurie Green comes in for followup.  She was hospitalized back in May.  She came in, I think, on 09/03/2012.  She had abdominal pain.  She had nausea and vomiting.  She had some diarrhea. The patient said that she has diarrhea ever since she started the metformin.  While in the hospital she had a CT scan of the abdomen and pelvis.  She had some stable cirrhotic changes.  She had cavernous transformation of the portal vein and portal venous collaterals and esophageal varices. No evidence of malignancy was noted.  She is going to have an upper endoscopy with possible ERCP next week.  I think that she should get off the Coumadin.  I think she should stop her Coumadin about 3 or 4 days before the procedure and restart the day of the procedure.  She does not feel all that well.  She just feels tired.  This may be from medications.  It may be from diabetes and high blood sugars.  PHYSICAL EXAMINATION:  General:  This is a well-developed, well- nourished white female, in no obvious distress.  Vital signs: Temperature of 98.2, pulse 59, respiratory rate 16, blood pressure 126/63.  Weight is 149.  Head and neck:  Normocephalic, atraumatic skull.  There are no ocular or oral lesions.  There are no palpable cervical or supraclavicular lymph nodes.  Lungs:  Clear to percussion and auscultation bilaterally.  Cardiac:  Regular rate and rhythm with a normal S1, S2.  There are no murmurs, rubs, or bruits.  Abdomen:  Soft. She has multiple laparotomy scars.  There is no spleen.  She has no palpable hepatomegaly.  There is no obvious fluid wave.  There is  no palpable hepatomegaly.  Extremities:  Show no clubbing, cyanosis or edema.  Neurological:  Shows no focal neurological deficits.  LABORATORY STUDIES:  White cell count is 7.7, hemoglobin 14.9, hematocrit 42.6, platelet count 416,000.  LFTs are normal.  Glucose is 171.  IMPRESSION:  Laurie Green is a 67 year old white female with antiphospholipid antibody syndrome.  She has not had a blood clot now for over 12 years.  I am still not sure why she has this abdominal issue.  She does have some cirrhosis.  However, it does not appear to be anything worse than what she has been dealing with.  We will go ahead and plan to get her back in another month.  We will send the medical clearance to Dr. [name] for her to have the upper endoscopy.  I suppose that she may have Helicobacter infection.  If so, this may explain her symptoms, and hopefully treating it will allow her symptoms to resolve.    ______________________________ [name], M.D. PRE/MEDQ  D:  09/18/2012  T:  09/19/2012  Job:  [id number]

## 2012-09-25 NOTE — Preoperative (Signed)
Beta Blockers   Reason not to administer Beta Blockers:Not Applicable Pt took beta blocker this morning 09-25-12

## 2012-09-25 NOTE — Anesthesia Postprocedure Evaluation (Signed)
  Anesthesia Post-op Note  Patient: Laurie Green  Procedure(s) Performed: Procedure(s) (LRB): ESOPHAGEAL ENDOSCOPIC ULTRASOUND (EUS) RADIAL (N/A)  Patient Location: PACU  Anesthesia Type: MAC  Level of Consciousness: awake and alert   Airway and Oxygen Therapy: Patient Spontanous Breathing  Post-op Pain: mild  Post-op Assessment: Post-op Vital signs reviewed, Patient's Cardiovascular Status Stable, Respiratory Function Stable, Patent Airway and No signs of Nausea or vomiting  Last Vitals:  Filed Vitals:   09/25/12 0950  BP: 118/73  Pulse:   Temp:   Resp: 15    Post-op Vital Signs: stable   Complications: No apparent anesthesia complications

## 2012-09-27 ENCOUNTER — Other Ambulatory Visit (HOSPITAL_BASED_OUTPATIENT_CLINIC_OR_DEPARTMENT_OTHER): Payer: BC Managed Care – PPO | Admitting: Lab

## 2012-09-27 DIAGNOSIS — I82409 Acute embolism and thrombosis of unspecified deep veins of unspecified lower extremity: Secondary | ICD-10-CM

## 2012-09-27 LAB — CBC WITH DIFFERENTIAL (CANCER CENTER ONLY)
BASO#: 0.1 10*3/uL (ref 0.0–0.2)
BASO%: 0.7 % (ref 0.0–2.0)
EOS%: 5.9 % (ref 0.0–7.0)
Eosinophils Absolute: 0.4 10*3/uL (ref 0.0–0.5)
HCT: 42 % (ref 34.8–46.6)
HGB: 14.6 g/dL (ref 11.6–15.9)
LYMPH#: 1.9 10*3/uL (ref 0.9–3.3)
LYMPH%: 27.4 % (ref 14.0–48.0)
MCH: 33.5 pg (ref 26.0–34.0)
MCHC: 34.8 g/dL (ref 32.0–36.0)
MCV: 96 fL (ref 81–101)
MONO#: 0.9 10*3/uL (ref 0.1–0.9)
MONO%: 13.3 % — ABNORMAL HIGH (ref 0.0–13.0)
NEUT#: 3.7 10*3/uL (ref 1.5–6.5)
NEUT%: 52.7 % (ref 39.6–80.0)
Platelets: 310 10*3/uL (ref 145–400)
RBC: 4.36 10*6/uL (ref 3.70–5.32)
RDW: 12.8 % (ref 11.1–15.7)
WBC: 7.1 10*3/uL (ref 3.9–10.0)

## 2012-09-27 LAB — PROTIME-INR (CHCC SATELLITE)
INR: 1.1 — ABNORMAL LOW (ref 2.0–3.5)
Protime: 13.2 Seconds (ref 10.6–13.4)

## 2012-09-30 ENCOUNTER — Telehealth: Payer: Self-pay | Admitting: Hematology & Oncology

## 2012-09-30 NOTE — Telephone Encounter (Signed)
Patient called and 10/04/12 apt and resh for 10/02/12

## 2012-10-02 ENCOUNTER — Other Ambulatory Visit (HOSPITAL_BASED_OUTPATIENT_CLINIC_OR_DEPARTMENT_OTHER): Payer: BC Managed Care – PPO | Admitting: Lab

## 2012-10-02 ENCOUNTER — Telehealth: Payer: Self-pay | Admitting: Hematology & Oncology

## 2012-10-02 DIAGNOSIS — I82409 Acute embolism and thrombosis of unspecified deep veins of unspecified lower extremity: Secondary | ICD-10-CM

## 2012-10-02 DIAGNOSIS — E119 Type 2 diabetes mellitus without complications: Secondary | ICD-10-CM

## 2012-10-02 LAB — CBC WITH DIFFERENTIAL (CANCER CENTER ONLY)
BASO#: 0.1 10*3/uL (ref 0.0–0.2)
BASO%: 0.7 % (ref 0.0–2.0)
EOS%: 4.9 % (ref 0.0–7.0)
Eosinophils Absolute: 0.4 10*3/uL (ref 0.0–0.5)
HCT: 42.5 % (ref 34.8–46.6)
HGB: 15 g/dL (ref 11.6–15.9)
LYMPH#: 2.2 10*3/uL (ref 0.9–3.3)
LYMPH%: 30.6 % (ref 14.0–48.0)
MCH: 33.7 pg (ref 26.0–34.0)
MCHC: 35.3 g/dL (ref 32.0–36.0)
MCV: 96 fL (ref 81–101)
MONO#: 0.8 10*3/uL (ref 0.1–0.9)
MONO%: 11.1 % (ref 0.0–13.0)
NEUT#: 3.8 10*3/uL (ref 1.5–6.5)
NEUT%: 52.7 % (ref 39.6–80.0)
Platelets: 285 10*3/uL (ref 145–400)
RBC: 4.45 10*6/uL (ref 3.70–5.32)
RDW: 12.8 % (ref 11.1–15.7)
WBC: 7.1 10*3/uL (ref 3.9–10.0)

## 2012-10-02 LAB — PROTIME-INR (CHCC SATELLITE)
INR: 1.3 — ABNORMAL LOW (ref 2.0–3.5)
Protime: 15.6 Seconds — ABNORMAL HIGH (ref 10.6–13.4)

## 2012-10-02 NOTE — Telephone Encounter (Signed)
While in office, patient cx 10/04/12 apt and resch for 10/07/12

## 2012-10-04 ENCOUNTER — Other Ambulatory Visit: Payer: BC Managed Care – PPO | Admitting: Lab

## 2012-10-07 ENCOUNTER — Ambulatory Visit (HOSPITAL_BASED_OUTPATIENT_CLINIC_OR_DEPARTMENT_OTHER): Payer: BC Managed Care – PPO | Admitting: Lab

## 2012-10-07 ENCOUNTER — Telehealth: Payer: Self-pay | Admitting: Hematology & Oncology

## 2012-10-07 DIAGNOSIS — Z7901 Long term (current) use of anticoagulants: Secondary | ICD-10-CM

## 2012-10-07 DIAGNOSIS — I82409 Acute embolism and thrombosis of unspecified deep veins of unspecified lower extremity: Secondary | ICD-10-CM

## 2012-10-07 LAB — PROTIME-INR (CHCC SATELLITE)
INR: 1.5 — ABNORMAL LOW (ref 2.0–3.5)
Protime: 18 Seconds — ABNORMAL HIGH (ref 10.6–13.4)

## 2012-10-07 NOTE — Telephone Encounter (Signed)
While patient was in the office today, she cx 10/16/12 apt due to going out of town and [place] for 11/07/12

## 2012-10-11 ENCOUNTER — Telehealth: Payer: Self-pay | Admitting: Hematology & Oncology

## 2012-10-11 ENCOUNTER — Other Ambulatory Visit: Payer: BC Managed Care – PPO | Admitting: Lab

## 2012-10-11 ENCOUNTER — Encounter: Payer: Self-pay | Admitting: *Deleted

## 2012-10-11 NOTE — Progress Notes (Signed)
Patients INR 1.5.  PAtient going out of town today.  Dr. Marin Olp wants patient to take Coumadin 15 mg today, 7.5 on Sat, 7.5 on Sunday the 29th, 15 mg the 30th, 7.5 mg the 1st and second of July.  15 mg the 3rd of July, 7.5 the 4 and 5 of July, 15 mg the 6th of July and a repeat INR on July 7 in our offic.e

## 2012-10-11 NOTE — Telephone Encounter (Signed)
Patient was transferred to me by Vivia Birmingham to sch lab apt.  Patient sch apt for 10/21/12

## 2012-10-16 ENCOUNTER — Other Ambulatory Visit: Payer: BC Managed Care – PPO | Admitting: Lab

## 2012-10-16 ENCOUNTER — Ambulatory Visit: Payer: BC Managed Care – PPO | Admitting: Hematology & Oncology

## 2012-10-20 ENCOUNTER — Other Ambulatory Visit: Payer: Self-pay | Admitting: Hematology & Oncology

## 2012-10-21 ENCOUNTER — Other Ambulatory Visit (HOSPITAL_BASED_OUTPATIENT_CLINIC_OR_DEPARTMENT_OTHER): Payer: BC Managed Care – PPO | Admitting: Lab

## 2012-10-21 DIAGNOSIS — I82403 Acute embolism and thrombosis of unspecified deep veins of lower extremity, bilateral: Secondary | ICD-10-CM

## 2012-10-21 DIAGNOSIS — K7581 Nonalcoholic steatohepatitis (NASH): Secondary | ICD-10-CM

## 2012-10-21 DIAGNOSIS — I82409 Acute embolism and thrombosis of unspecified deep veins of unspecified lower extremity: Secondary | ICD-10-CM

## 2012-10-21 LAB — CBC WITH DIFFERENTIAL (CANCER CENTER ONLY)
BASO#: 0.1 10*3/uL (ref 0.0–0.2)
BASO%: 1.1 % (ref 0.0–2.0)
EOS%: 6.4 % (ref 0.0–7.0)
Eosinophils Absolute: 0.4 10*3/uL (ref 0.0–0.5)
HCT: 42.5 % (ref 34.8–46.6)
HGB: 14.9 g/dL (ref 11.6–15.9)
LYMPH#: 1.9 10*3/uL (ref 0.9–3.3)
LYMPH%: 34.7 % (ref 14.0–48.0)
MCH: 33.6 pg (ref 26.0–34.0)
MCHC: 35.1 g/dL (ref 32.0–36.0)
MCV: 96 fL (ref 81–101)
MONO#: 0.7 10*3/uL (ref 0.1–0.9)
MONO%: 12.3 % (ref 0.0–13.0)
NEUT#: 2.5 10*3/uL (ref 1.5–6.5)
NEUT%: 45.5 % (ref 39.6–80.0)
Platelets: 325 10*3/uL (ref 145–400)
RBC: 4.43 10*6/uL (ref 3.70–5.32)
RDW: 13 % (ref 11.1–15.7)
WBC: 5.5 10*3/uL (ref 3.9–10.0)

## 2012-10-21 LAB — COMPREHENSIVE METABOLIC PANEL
ALT: 28 U/L (ref 0–35)
AST: 28 U/L (ref 0–37)
Albumin: 3.9 g/dL (ref 3.5–5.2)
Alkaline Phosphatase: 80 U/L (ref 39–117)
BUN: 12 mg/dL (ref 6–23)
CO2: 28 mEq/L (ref 19–32)
Calcium: 9.7 mg/dL (ref 8.4–10.5)
Chloride: 104 mEq/L (ref 96–112)
Creatinine, Ser: 0.79 mg/dL (ref 0.50–1.10)
Glucose, Bld: 185 mg/dL — ABNORMAL HIGH (ref 70–99)
Potassium: 3.9 mEq/L (ref 3.5–5.3)
Sodium: 141 mEq/L (ref 135–145)
Total Bilirubin: 1.2 mg/dL (ref 0.3–1.2)
Total Protein: 6.4 g/dL (ref 6.0–8.3)

## 2012-10-21 LAB — PROTIME-INR (CHCC SATELLITE)
INR: 2.7 (ref 2.0–3.5)
Protime: 32.4 Seconds — ABNORMAL HIGH (ref 10.6–13.4)

## 2012-11-07 ENCOUNTER — Other Ambulatory Visit (HOSPITAL_BASED_OUTPATIENT_CLINIC_OR_DEPARTMENT_OTHER): Payer: BC Managed Care – PPO | Admitting: Lab

## 2012-11-07 ENCOUNTER — Other Ambulatory Visit: Payer: Self-pay | Admitting: *Deleted

## 2012-11-07 ENCOUNTER — Ambulatory Visit (HOSPITAL_BASED_OUTPATIENT_CLINIC_OR_DEPARTMENT_OTHER): Payer: BC Managed Care – PPO | Admitting: Hematology & Oncology

## 2012-11-07 ENCOUNTER — Encounter: Payer: Self-pay | Admitting: Hematology & Oncology

## 2012-11-07 VITALS — BP 125/64 | HR 55 | Temp 98.0°F | Resp 16 | Ht 64.0 in | Wt 150.0 lb

## 2012-11-07 DIAGNOSIS — I82409 Acute embolism and thrombosis of unspecified deep veins of unspecified lower extremity: Secondary | ICD-10-CM

## 2012-11-07 DIAGNOSIS — D6861 Antiphospholipid syndrome: Secondary | ICD-10-CM | POA: Insufficient documentation

## 2012-11-07 DIAGNOSIS — I82403 Acute embolism and thrombosis of unspecified deep veins of lower extremity, bilateral: Secondary | ICD-10-CM

## 2012-11-07 DIAGNOSIS — D6859 Other primary thrombophilia: Secondary | ICD-10-CM

## 2012-11-07 HISTORY — DX: Antiphospholipid syndrome: D68.61

## 2012-11-07 LAB — CBC WITH DIFFERENTIAL (CANCER CENTER ONLY)
BASO#: 0.1 10*3/uL (ref 0.0–0.2)
BASO%: 0.8 % (ref 0.0–2.0)
EOS%: 4.8 % (ref 0.0–7.0)
Eosinophils Absolute: 0.3 10*3/uL (ref 0.0–0.5)
HCT: 42.8 % (ref 34.8–46.6)
HGB: 15.1 g/dL (ref 11.6–15.9)
LYMPH#: 2.3 10*3/uL (ref 0.9–3.3)
LYMPH%: 35.7 % (ref 14.0–48.0)
MCH: 33.6 pg (ref 26.0–34.0)
MCHC: 35.3 g/dL (ref 32.0–36.0)
MCV: 95 fL (ref 81–101)
MONO#: 0.8 10*3/uL (ref 0.1–0.9)
MONO%: 12.7 % (ref 0.0–13.0)
NEUT#: 2.9 10*3/uL (ref 1.5–6.5)
NEUT%: 46 % (ref 39.6–80.0)
Platelets: 301 10*3/uL (ref 145–400)
RBC: 4.49 10*6/uL (ref 3.70–5.32)
RDW: 12.5 % (ref 11.1–15.7)
WBC: 6.3 10*3/uL (ref 3.9–10.0)

## 2012-11-07 LAB — PROTIME-INR (CHCC SATELLITE)
INR: 1.8 — ABNORMAL LOW (ref 2.0–3.5)
Protime: 21.6 Seconds — ABNORMAL HIGH (ref 10.6–13.4)

## 2012-11-07 MED ORDER — AZELASTINE HCL 0.1 % NA SOLN: 1.0000 | NASAL | Status: DC | PRN

## 2012-11-07 NOTE — Progress Notes (Signed)
This office note has been dictated.

## 2012-11-08 NOTE — Progress Notes (Signed)
CC:   Frann Rider, M.D. James L. Rolla Flatten., M.D.  DIAGNOSES: 1. Antiphospholipid antibody syndrome with history of recurrent deep     vein thromboses. 2. Nonalcoholic steatohepatitis. 3. Non-insulin-dependent diabetes. 4. Fibromyalgia.  CURRENT THERAPY:  Coumadin 7.5 mg p.o. daily.  Trying to keep her INR about 2-3.  INTERIM HISTORY:  Laurie Green comes in for her followup.  She is doing better.  The toenail on the left big toe is starting to come back.  She is happy about this.  She feels okay.  She has been seeing Dr. Oletta Lamas.  She was told that she did not have any more esophageal varices.  She has had no problems with bleeding.  There has been some occasional leg pain.  She has had no nausea or vomiting, which has been a problem for her in the past.  PHYSICAL EXAMINATION:  General:  This is a well-developed, well- nourished white female in no obvious distress.  Vital signs: Temperature of 98, pulse 55, respiratory rate 16, blood pressure 125/64. Weight is 150.  Head and neck:  Normocephalic, atraumatic skull.  There are no ocular or oral lesions.  There is no adenopathy in the neck. Lungs:  Clear bilaterally.  Cardiac:  Regular rate and rhythm with no murmurs, rubs or bruits.  Abdomen:  Soft.  She has good bowel sounds. There is no fluid wave.  There is no palpable hepatomegaly.  She has well-healed laparotomy scars.  Extremities:  Show no clubbing, cyanosis or edema.  Skin:  Shows no rashes, ecchymoses or petechia.  LABORATORY STUDIES:  White cell count is 6.3, hemoglobin 15.1, hematocrit 42.8, platelet count 301.  INR is 1.8.  IMPRESSION:  Laurie Green is a 67 year old white female with antiphospholipid antibody syndrome.  Again, she has not had a DVT now for over 10 years.  She is on long-term Coumadin, however.  We will recheck her INR every 3 weeks.  I will plan to see her back in 6 weeks' time.    ______________________________ [name],  M.D. PRE/MEDQ  D:  11/07/2012  T:  11/08/2012  Job:  [id number]

## 2012-11-27 ENCOUNTER — Other Ambulatory Visit (HOSPITAL_BASED_OUTPATIENT_CLINIC_OR_DEPARTMENT_OTHER): Payer: BC Managed Care – PPO | Admitting: Lab

## 2012-11-27 DIAGNOSIS — I82403 Acute embolism and thrombosis of unspecified deep veins of lower extremity, bilateral: Secondary | ICD-10-CM

## 2012-11-27 DIAGNOSIS — I82409 Acute embolism and thrombosis of unspecified deep veins of unspecified lower extremity: Secondary | ICD-10-CM

## 2012-11-27 LAB — CBC WITH DIFFERENTIAL (CANCER CENTER ONLY)
BASO#: 0.1 10*3/uL (ref 0.0–0.2)
BASO%: 0.7 % (ref 0.0–2.0)
EOS%: 3.4 % (ref 0.0–7.0)
Eosinophils Absolute: 0.2 10*3/uL (ref 0.0–0.5)
HCT: 42.7 % (ref 34.8–46.6)
HGB: 15 g/dL (ref 11.6–15.9)
LYMPH#: 2 10*3/uL (ref 0.9–3.3)
LYMPH%: 28.8 % (ref 14.0–48.0)
MCH: 33.6 pg (ref 26.0–34.0)
MCHC: 35.1 g/dL (ref 32.0–36.0)
MCV: 96 fL (ref 81–101)
MONO#: 0.8 10*3/uL (ref 0.1–0.9)
MONO%: 12.1 % (ref 0.0–13.0)
NEUT#: 3.7 10*3/uL (ref 1.5–6.5)
NEUT%: 55 % (ref 39.6–80.0)
Platelets: 309 10*3/uL (ref 145–400)
RBC: 4.47 10*6/uL (ref 3.70–5.32)
RDW: 12.7 % (ref 11.1–15.7)
WBC: 6.8 10*3/uL (ref 3.9–10.0)

## 2012-11-27 LAB — PROTIME-INR (CHCC SATELLITE)
INR: 2.1 (ref 2.0–3.5)
Protime: 25.2 Seconds — ABNORMAL HIGH (ref 10.6–13.4)

## 2012-11-28 ENCOUNTER — Telehealth: Payer: Self-pay | Admitting: *Deleted

## 2012-11-28 NOTE — Telephone Encounter (Signed)
Called patient to let her know that her INR was 2.1 and no change needed in Coumadin dosing.  Left message on patients personal cell phone

## 2012-11-28 NOTE — Telephone Encounter (Signed)
Message copied by Rico Ala on Thu Nov 28, 2012  1:45 PM ------      Message from: Burney Gauze R      Created: Thu Nov 28, 2012  7:44 AM       Please call and let her know that her INR is 2.1. No change in Coumadin dosing. Thanks. Pete ------

## 2012-12-04 ENCOUNTER — Other Ambulatory Visit: Payer: BC Managed Care – PPO | Admitting: Lab

## 2012-12-11 ENCOUNTER — Other Ambulatory Visit: Payer: BC Managed Care – PPO | Admitting: Lab

## 2012-12-19 ENCOUNTER — Other Ambulatory Visit (HOSPITAL_BASED_OUTPATIENT_CLINIC_OR_DEPARTMENT_OTHER): Payer: BC Managed Care – PPO | Admitting: Lab

## 2012-12-19 ENCOUNTER — Ambulatory Visit (HOSPITAL_BASED_OUTPATIENT_CLINIC_OR_DEPARTMENT_OTHER): Payer: BC Managed Care – PPO | Admitting: Hematology & Oncology

## 2012-12-19 VITALS — BP 140/65 | HR 59 | Temp 98.1°F | Resp 16 | Ht 64.0 in | Wt 152.0 lb

## 2012-12-19 DIAGNOSIS — D6859 Other primary thrombophilia: Secondary | ICD-10-CM

## 2012-12-19 DIAGNOSIS — Z7901 Long term (current) use of anticoagulants: Secondary | ICD-10-CM

## 2012-12-19 DIAGNOSIS — I82403 Acute embolism and thrombosis of unspecified deep veins of lower extremity, bilateral: Secondary | ICD-10-CM

## 2012-12-19 DIAGNOSIS — Z86718 Personal history of other venous thrombosis and embolism: Secondary | ICD-10-CM

## 2012-12-19 DIAGNOSIS — R109 Unspecified abdominal pain: Secondary | ICD-10-CM

## 2012-12-19 DIAGNOSIS — I82409 Acute embolism and thrombosis of unspecified deep veins of unspecified lower extremity: Secondary | ICD-10-CM

## 2012-12-19 LAB — CMP (CANCER CENTER ONLY)
ALT(SGPT): 30 U/L (ref 10–47)
AST: 28 U/L (ref 11–38)
Albumin: 3.9 g/dL (ref 3.3–5.5)
Alkaline Phosphatase: 76 U/L (ref 26–84)
BUN, Bld: 10 mg/dL (ref 7–22)
CO2: 32 mEq/L (ref 18–33)
Calcium: 9.5 mg/dL (ref 8.0–10.3)
Chloride: 99 mEq/L (ref 98–108)
Creat: 0.7 mg/dl (ref 0.6–1.2)
Glucose, Bld: 138 mg/dL — ABNORMAL HIGH (ref 73–118)
Potassium: 3.9 mEq/L (ref 3.3–4.7)
Sodium: 140 mEq/L (ref 128–145)
Total Bilirubin: 1.8 mg/dl — ABNORMAL HIGH (ref 0.20–1.60)
Total Protein: 7 g/dL (ref 6.4–8.1)

## 2012-12-19 LAB — CBC WITH DIFFERENTIAL (CANCER CENTER ONLY)
BASO#: 0.1 10*3/uL (ref 0.0–0.2)
BASO%: 1.1 % (ref 0.0–2.0)
EOS%: 3.6 % (ref 0.0–7.0)
Eosinophils Absolute: 0.2 10*3/uL (ref 0.0–0.5)
HCT: 42.8 % (ref 34.8–46.6)
HGB: 15.2 g/dL (ref 11.6–15.9)
LYMPH#: 2.2 10*3/uL (ref 0.9–3.3)
LYMPH%: 35.5 % (ref 14.0–48.0)
MCH: 33.7 pg (ref 26.0–34.0)
MCHC: 35.5 g/dL (ref 32.0–36.0)
MCV: 95 fL (ref 81–101)
MONO#: 0.8 10*3/uL (ref 0.1–0.9)
MONO%: 12.7 % (ref 0.0–13.0)
NEUT#: 2.9 10*3/uL (ref 1.5–6.5)
NEUT%: 47.1 % (ref 39.6–80.0)
Platelets: 307 10*3/uL (ref 145–400)
RBC: 4.51 10*6/uL (ref 3.70–5.32)
RDW: 13 % (ref 11.1–15.7)
WBC: 6.1 10*3/uL (ref 3.9–10.0)

## 2012-12-19 LAB — PROTIME-INR (CHCC SATELLITE)
INR: 1.7 — ABNORMAL LOW (ref 2.0–3.5)
Protime: 20.4 Seconds — ABNORMAL HIGH (ref 10.6–13.4)

## 2012-12-19 MED ORDER — WARFARIN SODIUM 10 MG PO TABS: 10.0000 mg | ORAL_TABLET | Freq: Every day | ORAL | Status: DC

## 2012-12-19 NOTE — Progress Notes (Signed)
This office note has been dictated.

## 2012-12-20 ENCOUNTER — Telehealth: Payer: Self-pay | Admitting: Hematology & Oncology

## 2012-12-20 NOTE — Telephone Encounter (Signed)
Left message on home and cell with 9-10 and 10-3 times

## 2012-12-20 NOTE — Progress Notes (Signed)
CC:   Laurie Green, M.D. James L. Rolla Flatten., M.D.  DIAGNOSES: 1. History of recurrent deep venous thrombosis. 2. Antiphospholipid antibody syndrome. 3. NASH. 4. Fibromyalgia. 5. Non-insulin-dependent diabetes.  CURRENT THERAPY:  Coumadin to maintain INR between 2-3.  INTERIM HISTORY:  Laurie Green comes in for followup.  She is doing okay. She still has this issue with some pain over on her right side.  This is just below the ribcage.  I am not sure what could be causing this.  She has had multiple abdominal surgeries.  This certainly could be scar tissue.  She said that she was supposed to have an MRI of her liver. This was postponed by her.  She is going to see about maybe have this done now. She has had no bleeding.  She has had no further episodes of the nausea and vomiting.  She has had no cough.  She has had no fever.  She does feel tired on occasion.  She does have fibromyalgia.  This has been causing her some problems.  PHYSICAL EXAMINATION:  General:  This is a fairly well-developed, well- nourished white female in no obvious distress.  Vital signs:  Shows a temperature of 98.1, pulse 59, respiratory rate 16, blood pressure 140/65.  Weight is 152.  Head and neck exam:  Shows a normocephalic, atraumatic skull.  There are no ocular or oral lesions.  There are no palpable cervical or supraclavicular lymph nodes.  Lungs:  Clear bilaterally.  Cardiac:  Regular rate and rhythm with a normal S1, S2. There are no murmurs, rubs or bruits.  Abdomen:  Soft.  She has good bowel sounds.  There is no fluid wave.  She has multiple laparotomy scars.  She has no palpable hepatomegaly.  There is some tenderness all over on the right side.  This is again, just below the ribcage. Extremities:  Show no clubbing, cyanosis or edema.  Back:  No tenderness over the spine, ribs, or hips.  Neurological:  Shows no focal neurological deficits.  Skin:  No rashes, ecchymosis, or  petechia.  LABORATORY STUDIES:  White cell count is 6.1, hemoglobin 15.2, hematocrit 42.8, platelet count 307.  Total bilirubin 1.8.  Glucose 138.  INR is 1.7.  IMPRESSION:  Mr. Stoneberg is a very nice 67 year old white female with the antiphospholipid antibody syndrome.  She has not had a DVT now probably for over 10 years.  This I am certainly happy about.  We last checked her cardiolipin antibodies a year ago and everything was normal.  Despite this, we have her on continued long-term anticoagulation.  I will have her increase her Coumadin up to 10 mg a day.  We will repeat her INR in 1 week.  I want to see her back probably in about 4 or 5 weeks.    ______________________________ [name], M.D. PRE/MEDQ  D:  12/19/2012  T:  12/20/2012  Job:  [id number]

## 2012-12-23 ENCOUNTER — Telehealth: Payer: Self-pay | Admitting: Hematology & Oncology

## 2012-12-23 NOTE — Addendum Note (Signed)
Addended by: Burney Gauze R on: 12/23/2012 02:50 PM   Modules accepted: Orders

## 2012-12-23 NOTE — Telephone Encounter (Signed)
[phone]) from [facility] called wanted to add lipase to 9-10 lab. Per Dr. Marin Olp he will add labs. Please fax results to [phone]

## 2012-12-25 ENCOUNTER — Encounter: Payer: BC Managed Care – PPO | Attending: Family Medicine | Admitting: *Deleted

## 2012-12-25 ENCOUNTER — Other Ambulatory Visit (HOSPITAL_BASED_OUTPATIENT_CLINIC_OR_DEPARTMENT_OTHER): Payer: BC Managed Care – PPO | Admitting: Lab

## 2012-12-25 ENCOUNTER — Encounter: Payer: Self-pay | Admitting: *Deleted

## 2012-12-25 VITALS — Ht 65.0 in | Wt 153.0 lb

## 2012-12-25 DIAGNOSIS — I82403 Acute embolism and thrombosis of unspecified deep veins of lower extremity, bilateral: Secondary | ICD-10-CM

## 2012-12-25 DIAGNOSIS — I82409 Acute embolism and thrombosis of unspecified deep veins of unspecified lower extremity: Secondary | ICD-10-CM

## 2012-12-25 DIAGNOSIS — Z713 Dietary counseling and surveillance: Secondary | ICD-10-CM | POA: Insufficient documentation

## 2012-12-25 DIAGNOSIS — R109 Unspecified abdominal pain: Secondary | ICD-10-CM

## 2012-12-25 DIAGNOSIS — E119 Type 2 diabetes mellitus without complications: Secondary | ICD-10-CM | POA: Insufficient documentation

## 2012-12-25 LAB — CBC WITH DIFFERENTIAL (CANCER CENTER ONLY)
BASO#: 0 10*3/uL (ref 0.0–0.2)
BASO%: 0.6 % (ref 0.0–2.0)
EOS%: 2.5 % (ref 0.0–7.0)
Eosinophils Absolute: 0.2 10*3/uL (ref 0.0–0.5)
HCT: 42.6 % (ref 34.8–46.6)
HGB: 15 g/dL (ref 11.6–15.9)
LYMPH#: 1.9 10*3/uL (ref 0.9–3.3)
LYMPH%: 25.9 % (ref 14.0–48.0)
MCH: 33.6 pg (ref 26.0–34.0)
MCHC: 35.2 g/dL (ref 32.0–36.0)
MCV: 96 fL (ref 81–101)
MONO#: 0.7 10*3/uL (ref 0.1–0.9)
MONO%: 10.2 % (ref 0.0–13.0)
NEUT#: 4.3 10*3/uL (ref 1.5–6.5)
NEUT%: 60.8 % (ref 39.6–80.0)
Platelets: 291 10*3/uL (ref 145–400)
RBC: 4.46 10*6/uL (ref 3.70–5.32)
RDW: 13 % (ref 11.1–15.7)
WBC: 7.1 10*3/uL (ref 3.9–10.0)

## 2012-12-25 LAB — LIPASE: Lipase: 18 U/L (ref 0–75)

## 2012-12-25 LAB — PROTIME-INR (CHCC SATELLITE)
INR: 2.8 (ref 2.0–3.5)
Protime: 33.6 Seconds — ABNORMAL HIGH (ref 10.6–13.4)

## 2012-12-25 NOTE — Progress Notes (Signed)
Appt start time: 1000  end time:  1130.   Assessment:  Patient was seen on  12/25/12 for individual diabetes education. Laurie Green was diagnosed with T2DM approximately one year ago. She has not received any formal education prior to this date. Her diabetes is presently being managed by Dr. [name], however the patient questioned if it would be advantageous to obtain referral to endocrinology. Patient is presently taking Metformin 584m with dinner only. Her FBS range between 145 and 178.   Current HbA1c: 7.1%  MEDICATIONS: Metformin 5075mwith dinner   DIETARY INTAKE:  Usual eating pattern includes 2 meals and 1 snacks per day.  Everyday foods include Wide variety of protein, fruits and vegetables.  Avoids green leafy vegetables due to Coumadin   24-hr recall:  B ( AM): None  Snk ( AM): None  L ( PM): baked Chicken, water Snk ( PM): peanut butter crackers D ( PM): Pork [place]in, green beans, fresh corn, squash, onion, mushrooms, water Snk ( PM): Ice cream Beverages: coffee with creamer  Usual physical activity: none structured, walk dogs  Nutritional Diagnosis:  NB-1.1 Food and nutrition-related knowledge deficit As related to elevated glucose.  As evidenced by A1c 7.1%.    Intervention:  Nutrition counseling provided.  Discussed diabetes disease process and treatment options.  Discussed physiology of diabetes and role of obesity on insulin resistance.  Encouraged moderate weight reduction to improve glucose levels.  Discussed role of medications and diet in glucose control  Provided education on macronutrients on glucose levels.  Provided education on carb counting, importance of regularly scheduled meals/snacks, and meal planning  Discussed effects of physical activity on glucose levels and long-term glucose control.  Recommended 150 minutes of physical activity/week.  Reviewed patient medications.  Discussed role of medication on blood glucose and possible side  effects  Discussed blood glucose monitoring and interpretation.  Discussed recommended target ranges and individual ranges.    Described short-term complications: hyper- and hypo-glycemia.  Discussed causes,symptoms, and treatment options.  Discussed prevention, detection, and treatment of long-term complications.  Discussed the role of prolonged elevated glucose levels on body systems.  Discussed role of stress on blood glucose levels and discussed strategies to manage psychosocial issues.  Discussed recommendations for long-term diabetes self-care.  Established checklist for medical, dental, and emotional self-care.  Plan:  Aim for 3 Carb Choices per meal (45 grams) +/- 1 either way  Aim for 0-1 Carbs per snack if hungry  Consider reading food labels for Total Carbohydrate and Fat Grams of foods Consider  increasing your activity level by walking for 30 minutes daily as tolerated Continue checking BG at alternate times per day as directed by MD  Continue taking medication as directed by MD Consider having a 15gram carbohydrate snack before bed to see if that will affect your morning glucose reading. Discuss with your physician the possibility of increasing your Metformin to two times a day. Schedule for Core Classes  Handouts given during visit include: Living Well with Diabetes Carb Counting handouts Meal Plan Card Meal planning worksheet Snack handout  Barriers to learning/adherance to lifestyle change: Pain related to fibromyalgia  Diabetes self-care support plan:   [facility]  Family support  Monitoring/Evaluation:  Dietary intake, exercise, glucose readings, and body weight return for Core Classes.

## 2012-12-25 NOTE — Patient Instructions (Signed)
Plan:  Aim for 3 Carb Choices per meal (45 grams) +/- 1 either way  Aim for 0-1 Carbs per snack if hungry  Consider reading food labels for Total Carbohydrate and Fat Grams of foods Consider  increasing your activity level by walking for 30 minutes daily as tolerated Continue checking BG at alternate times per day as directed by MD  Continue taking medication as directed by MD

## 2012-12-27 ENCOUNTER — Telehealth: Payer: Self-pay | Admitting: *Deleted

## 2012-12-27 NOTE — Telephone Encounter (Signed)
Message copied by Rico Ala on Fri Dec 27, 2012 10:48 AM ------      Message from: Burney Gauze R      Created: Wed Dec 25, 2012  4:53 PM       Call - INR is perfect!!!  [name] ------

## 2012-12-27 NOTE — Telephone Encounter (Signed)
Called patient to let her know that her INR was perfect per dr. Marin Olp.  Patient is to stay on same dose of Coumadin

## 2013-01-14 ENCOUNTER — Telehealth: Payer: Self-pay | Admitting: Hematology & Oncology

## 2013-01-14 NOTE — Telephone Encounter (Signed)
Patient called and cx 01/17/13 apt.  Patient resch the lab apt only for 01/16/13  She left a message on the nurse line about rescheduling her Md apt

## 2013-01-16 ENCOUNTER — Other Ambulatory Visit (HOSPITAL_BASED_OUTPATIENT_CLINIC_OR_DEPARTMENT_OTHER): Payer: Medicare Other | Admitting: Lab

## 2013-01-16 DIAGNOSIS — I82409 Acute embolism and thrombosis of unspecified deep veins of unspecified lower extremity: Secondary | ICD-10-CM

## 2013-01-16 LAB — CBC WITH DIFFERENTIAL (CANCER CENTER ONLY)
BASO#: 0.1 10*3/uL (ref 0.0–0.2)
BASO%: 0.7 % (ref 0.0–2.0)
EOS%: 2.9 % (ref 0.0–7.0)
Eosinophils Absolute: 0.2 10*3/uL (ref 0.0–0.5)
HCT: 41.2 % (ref 34.8–46.6)
HGB: 14.7 g/dL (ref 11.6–15.9)
LYMPH#: 2.1 10*3/uL (ref 0.9–3.3)
LYMPH%: 30.2 % (ref 14.0–48.0)
MCH: 34 pg (ref 26.0–34.0)
MCHC: 35.7 g/dL (ref 32.0–36.0)
MCV: 95 fL (ref 81–101)
MONO#: 0.7 10*3/uL (ref 0.1–0.9)
MONO%: 10 % (ref 0.0–13.0)
NEUT#: 3.8 10*3/uL (ref 1.5–6.5)
NEUT%: 56.2 % (ref 39.6–80.0)
Platelets: 313 10*3/uL (ref 145–400)
RBC: 4.32 10*6/uL (ref 3.70–5.32)
RDW: 13.1 % (ref 11.1–15.7)
WBC: 6.8 10*3/uL (ref 3.9–10.0)

## 2013-01-16 LAB — PROTIME-INR (CHCC SATELLITE)
INR: 3.9 — ABNORMAL HIGH (ref 2.0–3.5)
Protime: 46.8 Seconds — ABNORMAL HIGH (ref 10.6–13.4)

## 2013-01-17 ENCOUNTER — Ambulatory Visit: Payer: BC Managed Care – PPO | Admitting: Hematology & Oncology

## 2013-01-17 ENCOUNTER — Other Ambulatory Visit: Payer: BC Managed Care – PPO | Admitting: Lab

## 2013-01-17 LAB — COMPREHENSIVE METABOLIC PANEL
ALT: 21 U/L (ref 0–35)
AST: 23 U/L (ref 0–37)
Albumin: 3.9 g/dL (ref 3.5–5.2)
Alkaline Phosphatase: 75 U/L (ref 39–117)
BUN: 17 mg/dL (ref 6–23)
CO2: 28 mEq/L (ref 19–32)
Calcium: 9.5 mg/dL (ref 8.4–10.5)
Chloride: 102 mEq/L (ref 96–112)
Creatinine, Ser: 0.8 mg/dL (ref 0.50–1.10)
Glucose, Bld: 185 mg/dL — ABNORMAL HIGH (ref 70–99)
Potassium: 4 mEq/L (ref 3.5–5.3)
Sodium: 139 mEq/L (ref 135–145)
Total Bilirubin: 1.1 mg/dL (ref 0.3–1.2)
Total Protein: 6.4 g/dL (ref 6.0–8.3)

## 2013-01-17 LAB — CARDIOLIPIN ANTIBODIES, IGG, IGM, IGA
Anticardiolipin IgA: 1 APL U/mL (ref <22)
Anticardiolipin IgG: 4 GPL U/mL (ref <23)
Anticardiolipin IgM: 0 MPL U/mL (ref <11)

## 2013-02-19 ENCOUNTER — Other Ambulatory Visit: Payer: Self-pay | Admitting: Hematology & Oncology

## 2013-02-24 ENCOUNTER — Ambulatory Visit (HOSPITAL_BASED_OUTPATIENT_CLINIC_OR_DEPARTMENT_OTHER): Payer: BC Managed Care – PPO | Admitting: Hematology & Oncology

## 2013-02-24 ENCOUNTER — Other Ambulatory Visit (HOSPITAL_BASED_OUTPATIENT_CLINIC_OR_DEPARTMENT_OTHER): Payer: Medicare Other | Admitting: Lab

## 2013-02-24 ENCOUNTER — Other Ambulatory Visit: Payer: Self-pay | Admitting: *Deleted

## 2013-02-24 VITALS — BP 129/63 | HR 65 | Temp 98.1°F | Resp 14 | Ht 65.0 in | Wt 151.0 lb

## 2013-02-24 DIAGNOSIS — Z7901 Long term (current) use of anticoagulants: Secondary | ICD-10-CM

## 2013-02-24 DIAGNOSIS — D6859 Other primary thrombophilia: Secondary | ICD-10-CM

## 2013-02-24 DIAGNOSIS — I82409 Acute embolism and thrombosis of unspecified deep veins of unspecified lower extremity: Secondary | ICD-10-CM

## 2013-02-24 DIAGNOSIS — I82403 Acute embolism and thrombosis of unspecified deep veins of lower extremity, bilateral: Secondary | ICD-10-CM

## 2013-02-24 DIAGNOSIS — Z86718 Personal history of other venous thrombosis and embolism: Secondary | ICD-10-CM

## 2013-02-24 DIAGNOSIS — D72829 Elevated white blood cell count, unspecified: Secondary | ICD-10-CM

## 2013-02-24 LAB — CBC WITH DIFFERENTIAL (CANCER CENTER ONLY)
BASO#: 0 10*3/uL (ref 0.0–0.2)
BASO%: 0.6 % (ref 0.0–2.0)
EOS%: 3.3 % (ref 0.0–7.0)
Eosinophils Absolute: 0.2 10*3/uL (ref 0.0–0.5)
HCT: 42 % (ref 34.8–46.6)
HGB: 14.6 g/dL (ref 11.6–15.9)
LYMPH#: 2.3 10*3/uL (ref 0.9–3.3)
LYMPH%: 33.9 % (ref 14.0–48.0)
MCH: 33.3 pg (ref 26.0–34.0)
MCHC: 34.8 g/dL (ref 32.0–36.0)
MCV: 96 fL (ref 81–101)
MONO#: 0.9 10*3/uL (ref 0.1–0.9)
MONO%: 13.6 % — ABNORMAL HIGH (ref 0.0–13.0)
NEUT#: 3.3 10*3/uL (ref 1.5–6.5)
NEUT%: 48.6 % (ref 39.6–80.0)
Platelets: 298 10*3/uL (ref 145–400)
RBC: 4.39 10*6/uL (ref 3.70–5.32)
RDW: 12.6 % (ref 11.1–15.7)
WBC: 6.7 10*3/uL (ref 3.9–10.0)

## 2013-02-24 LAB — PROTIME-INR (CHCC SATELLITE)
INR: 1.6 — ABNORMAL LOW (ref 2.0–3.5)
Protime: 19.2 Seconds — ABNORMAL HIGH (ref 10.6–13.4)

## 2013-02-24 NOTE — Progress Notes (Signed)
This office note has been dictated.

## 2013-02-25 NOTE — Progress Notes (Signed)
CC:   Laurie Green, M.D. James L. Rolla Flatten., M.D.  DIAGNOSES: 1. History of recurrent deep venous thrombosis. 2. Antiphospholipid antibody syndrome. 3. Nonalcoholic steatohepatitis. 4. Non-insulin-dependent diabetes.  CURRENT THERAPY:  Coumadin 7.5 mg p.o. daily.  INTERIM HISTORY:  Laurie Green comes in for followup.  She is doing okay. She is complaining of some ear issues.  She has seen ENT.  They cannot seem to find anything.  I told her she could try a decongestant to see if this helps.  She has had no problems with her legs.  She has occasional nausea.  She has occasional abdominal pain.  She has had no fever.  There has been no bleeding.  She is seeing a chiropractor right now for her back, which she says is helping her quite a bit.  PHYSICAL EXAMINATION:  General:  This is a well-developed, well- nourished white female in no obvious distress.  Vital signs: Temperature of 98.1, pulse 65, respiratory rate 14, blood pressure 129/64; weight is 151 pounds.  Head and Neck:  Normocephalic, atraumatic skull.  There are no ocular or oral lesions.  She has no palpable cervical or supraclavicular lymph nodes.  Lungs:  Clear bilaterally. Cardiac:  Regular rate and rhythm with a normal S1, S2.  There are no murmurs, rubs, or bruits.  Abdomen:  Soft.  She has good bowel sounds. There is no palpable abdominal mass.  There is no palpable hepatomegaly. She has little laparotomy scars.  She has a splenectomy scar.  Back:  No tenderness over the spine, ribs, or hips.  Extremities:  No clubbing, cyanosis, or edema.  She has good range motion of her joints.  Ears:  I did look into her ears.  I do not see any erythema or swelling or fluid behind the eardrum.  LABORATORY STUDIES:  White cell count is 6.7, hemoglobin 14.6, hematocrit 42, platelet count 298.  INR is 1.6.  When we saw her last time, her anticardiolipin antibodies were normal.  IMPRESSION:  Laurie Green is a 67 year old  white female with a history of recurrent deep venous thrombosis.  She has antiphospholipid antibody syndrome.  I will still consider her for this, even though she has normal anticardiolipin antibodies.  Again, the Coumadin is down.  She had a big shot a couple days ago.  I will keep her on the regular Coumadin dose.  We will plan to get her back in a week so we can check her INR.  I will plan to see her back myself in 2 months.  We will doubly check her INR every 3 weeks.    ______________________________ [name], M.D. PRE/MEDQ  D:  02/24/2013  T:  02/25/2013  Job:  [id number]

## 2013-03-04 ENCOUNTER — Other Ambulatory Visit (HOSPITAL_BASED_OUTPATIENT_CLINIC_OR_DEPARTMENT_OTHER): Payer: Medicare Other | Admitting: Lab

## 2013-03-04 ENCOUNTER — Telehealth: Payer: Self-pay | Admitting: *Deleted

## 2013-03-04 DIAGNOSIS — I82403 Acute embolism and thrombosis of unspecified deep veins of lower extremity, bilateral: Secondary | ICD-10-CM

## 2013-03-04 DIAGNOSIS — I82409 Acute embolism and thrombosis of unspecified deep veins of unspecified lower extremity: Secondary | ICD-10-CM

## 2013-03-04 LAB — PROTIME-INR (CHCC SATELLITE)
INR: 2.1 (ref 2.0–3.5)
Protime: 25.2 Seconds — ABNORMAL HIGH (ref 10.6–13.4)

## 2013-03-04 NOTE — Telephone Encounter (Addendum)
Message copied by Jodelle Green on Tue Mar 04, 2013  3:11 PM ------      Message from: [name]      Created: Tue Mar 04, 2013  2:02 PM       Call - INR is perfect. Pete ------  Spoke to pt. Gave her the above message. She takes 7.5 mg daily. Due to return in 3 weeks for re-check.

## 2013-03-25 ENCOUNTER — Other Ambulatory Visit (HOSPITAL_BASED_OUTPATIENT_CLINIC_OR_DEPARTMENT_OTHER): Payer: Medicare Other | Admitting: Lab

## 2013-03-25 ENCOUNTER — Ambulatory Visit
Admission: RE | Admit: 2013-03-25 | Discharge: 2013-03-25 | Disposition: A | Discharge status: Home / Self Care | Payer: Medicare Other | Source: Ambulatory Visit | Attending: Family Medicine | Admitting: Family Medicine

## 2013-03-25 ENCOUNTER — Other Ambulatory Visit: Payer: Self-pay | Admitting: Family Medicine

## 2013-03-25 DIAGNOSIS — R059 Cough, unspecified: Secondary | ICD-10-CM

## 2013-03-25 DIAGNOSIS — I82409 Acute embolism and thrombosis of unspecified deep veins of unspecified lower extremity: Secondary | ICD-10-CM

## 2013-03-25 DIAGNOSIS — R05 Cough: Secondary | ICD-10-CM

## 2013-03-25 DIAGNOSIS — I82403 Acute embolism and thrombosis of unspecified deep veins of lower extremity, bilateral: Secondary | ICD-10-CM

## 2013-03-25 LAB — PROTIME-INR (CHCC SATELLITE)
INR: 1.3 — ABNORMAL LOW (ref 2.0–3.5)
Protime: 15.6 Seconds — ABNORMAL HIGH (ref 10.6–13.4)

## 2013-03-25 LAB — CBC WITH DIFFERENTIAL (CANCER CENTER ONLY)
BASO#: 0.1 10*3/uL (ref 0.0–0.2)
BASO%: 0.8 % (ref 0.0–2.0)
EOS%: 3.1 % (ref 0.0–7.0)
Eosinophils Absolute: 0.2 10*3/uL (ref 0.0–0.5)
HCT: 42.4 % (ref 34.8–46.6)
HGB: 14.8 g/dL (ref 11.6–15.9)
LYMPH#: 2.1 10*3/uL (ref 0.9–3.3)
LYMPH%: 31.9 % (ref 14.0–48.0)
MCH: 33.5 pg (ref 26.0–34.0)
MCHC: 34.9 g/dL (ref 32.0–36.0)
MCV: 96 fL (ref 81–101)
MONO#: 0.8 10*3/uL (ref 0.1–0.9)
MONO%: 12.6 % (ref 0.0–13.0)
NEUT#: 3.4 10*3/uL (ref 1.5–6.5)
NEUT%: 51.6 % (ref 39.6–80.0)
Platelets: 322 10*3/uL (ref 145–400)
RBC: 4.42 10*6/uL (ref 3.70–5.32)
RDW: 13 % (ref 11.1–15.7)
WBC: 6.5 10*3/uL (ref 3.9–10.0)

## 2013-03-27 ENCOUNTER — Telehealth: Payer: Self-pay | Admitting: Hematology & Oncology

## 2013-03-27 NOTE — Telephone Encounter (Signed)
Left message moved 1-5 to 1-15

## 2013-04-21 ENCOUNTER — Other Ambulatory Visit: Payer: Medicare Other | Admitting: Lab

## 2013-04-21 ENCOUNTER — Ambulatory Visit: Payer: Medicare Other | Admitting: Hematology & Oncology

## 2013-05-01 ENCOUNTER — Ambulatory Visit (HOSPITAL_BASED_OUTPATIENT_CLINIC_OR_DEPARTMENT_OTHER): Payer: Medicare Other | Admitting: Hematology & Oncology

## 2013-05-01 ENCOUNTER — Other Ambulatory Visit (HOSPITAL_BASED_OUTPATIENT_CLINIC_OR_DEPARTMENT_OTHER): Payer: Medicare Other | Admitting: Lab

## 2013-05-01 ENCOUNTER — Encounter: Payer: Self-pay | Admitting: Hematology & Oncology

## 2013-05-01 VITALS — BP 146/68 | HR 59 | Temp 98.1°F | Resp 14 | Ht 65.0 in | Wt 151.0 lb

## 2013-05-01 DIAGNOSIS — I82403 Acute embolism and thrombosis of unspecified deep veins of lower extremity, bilateral: Secondary | ICD-10-CM

## 2013-05-01 DIAGNOSIS — D6859 Other primary thrombophilia: Secondary | ICD-10-CM

## 2013-05-01 DIAGNOSIS — K7460 Unspecified cirrhosis of liver: Secondary | ICD-10-CM

## 2013-05-01 DIAGNOSIS — I82409 Acute embolism and thrombosis of unspecified deep veins of unspecified lower extremity: Secondary | ICD-10-CM

## 2013-05-01 DIAGNOSIS — D6861 Antiphospholipid syndrome: Secondary | ICD-10-CM

## 2013-05-01 DIAGNOSIS — Z86718 Personal history of other venous thrombosis and embolism: Secondary | ICD-10-CM

## 2013-05-01 LAB — CBC WITH DIFFERENTIAL (CANCER CENTER ONLY)
BASO#: 0.1 10*3/uL (ref 0.0–0.2)
BASO%: 0.7 % (ref 0.0–2.0)
EOS%: 6.6 % (ref 0.0–7.0)
Eosinophils Absolute: 0.5 10*3/uL (ref 0.0–0.5)
HCT: 44.1 % (ref 34.8–46.6)
HGB: 15.4 g/dL (ref 11.6–15.9)
LYMPH#: 2.3 10*3/uL (ref 0.9–3.3)
LYMPH%: 32.4 % (ref 14.0–48.0)
MCH: 33.1 pg (ref 26.0–34.0)
MCHC: 34.9 g/dL (ref 32.0–36.0)
MCV: 95 fL (ref 81–101)
MONO#: 0.8 10*3/uL (ref 0.1–0.9)
MONO%: 11.7 % (ref 0.0–13.0)
NEUT#: 3.5 10*3/uL (ref 1.5–6.5)
NEUT%: 48.6 % (ref 39.6–80.0)
Platelets: 340 10*3/uL (ref 145–400)
RBC: 4.65 10*6/uL (ref 3.70–5.32)
RDW: 12.9 % (ref 11.1–15.7)
WBC: 7.1 10*3/uL (ref 3.9–10.0)

## 2013-05-01 LAB — PROTIME-INR (CHCC SATELLITE)
INR: 1.7 — ABNORMAL LOW (ref 2.0–3.5)
Protime: 20.4 Seconds — ABNORMAL HIGH (ref 10.6–13.4)

## 2013-05-01 MED ORDER — WARFARIN SODIUM 10 MG PO TABS: 10.0000 mg | ORAL_TABLET | Freq: Every day | ORAL | Status: DC

## 2013-05-01 NOTE — Progress Notes (Signed)
This office note has been dictated.

## 2013-05-02 NOTE — Progress Notes (Signed)
DIAGNOSES: 1. Antiphospholipid antibody syndrome. 2. History of recurrent lower extremity deep venous thrombosis. 3. Nonalcoholic steatohepatitis.  CURRENT THERAPY:  Coumadin to maintain an INR between 2-3.  INTERIM HISTORY:  Laurie Green comes in for followup.  She is doing fairly well.  We saw her 2 months ago.  She was complaining of some ear issues at that time.  She is still having pain on her right __________.  She is seeing a Restaurant manager, fast food for this.  She feels that this is helping her out.  She is not having any problems with leg pain or swelling.  She does have the NASH issue.  This apparently has not been a problem with flare-ups.  She says she does have varices.  She has not had any problems with bleeding.  Her Coumadin has been on the low side.  She is having difficulty to manage with respect to her INR.  It can just change without notice.  She had a chest x-ray back in December.  Thankfully, nothing was noted. She is having some chest pain.  She is having a little bit of a cough.  PHYSICAL EXAMINATION:  General:  This is a fairly well-developed, well- nourished white female in no obvious distress.  Vital Signs: Temperature of 98.1, pulse 59, respiratory rate 14, blood pressure 146/68.  Weight is 151 pounds.  Head and Neck:  Normocephalic, atraumatic skull.  There are no ocular or oral lesions.  There are no palpable, cervical, or supraclavicular lymph nodes.  Lungs:  Clear bilaterally.  Cardiac:  Regular rate and rhythm with normal S1, S2. There are no murmurs, rubs, or bruits.  Abdomen:  Soft. There is no palpable abdominal mass.  She has laparotomy scars that are well healed. There may be some slight tenderness in the right upper quadrant.  There is no hepatomegaly.  She has had her spleen taken out already.  The splenectomy scar is well healed.  Extremities:  No clubbing, cyanosis, or edema.  She has good range of motion of her joints.  Neurological: No focal  neurological deficits.  Skin:  No rashes, ecchymosis, or petechia.  LABORATORY STUDIES:  White cell count is 7.1, hemoglobin 15.4, hematocrit 44.1, platelet count 340.  INR is 1.7.  IMPRESSION:  Ms. Wolman is a 68 year old white female.  She has history of the antiphospholipid antibody syndrome.  She has history of recurrent DVTs.  She has not had DVT now probably for a good 10 years.  We still have to monitor her INR closely.  She does have the varices that we have to be careful with.  We will have her change Coumadin dose to 7.5 mg a day alternating with 10 mg a day.  We will check her INR in 10 days.  We typically follow her INR every 3 weeks.  She is worried about this NASH issue.  I do not think she is actually seeing a gastroenterologist for this.  She __________ in the past.  We probably do need to consider following her alpha fetoprotein levels. We may also want to think about an ultrasound for screening to make sure we are not overlooking any type of malignancy.  I will see her back in 2 months.    ______________________________ [name], M.D. PRE/MEDQ  D:  05/01/2013  T:  05/02/2013  Job:  [id number]

## 2013-05-12 ENCOUNTER — Telehealth: Payer: Self-pay | Admitting: Hematology & Oncology

## 2013-05-12 ENCOUNTER — Other Ambulatory Visit: Payer: Medicare Other | Admitting: Lab

## 2013-05-12 NOTE — Telephone Encounter (Signed)
Pt moved 1-26 lab to 1-28 she is sick

## 2013-05-14 ENCOUNTER — Other Ambulatory Visit (HOSPITAL_BASED_OUTPATIENT_CLINIC_OR_DEPARTMENT_OTHER): Payer: Medicare Other | Admitting: Lab

## 2013-05-14 DIAGNOSIS — I82409 Acute embolism and thrombosis of unspecified deep veins of unspecified lower extremity: Secondary | ICD-10-CM

## 2013-05-14 DIAGNOSIS — I82403 Acute embolism and thrombosis of unspecified deep veins of lower extremity, bilateral: Secondary | ICD-10-CM

## 2013-05-14 LAB — CBC WITH DIFFERENTIAL (CANCER CENTER ONLY)
BASO#: 0 10*3/uL (ref 0.0–0.2)
BASO%: 0.5 % (ref 0.0–2.0)
EOS%: 6.8 % (ref 0.0–7.0)
Eosinophils Absolute: 0.4 10*3/uL (ref 0.0–0.5)
HCT: 41.5 % (ref 34.8–46.6)
HGB: 14.4 g/dL (ref 11.6–15.9)
LYMPH#: 2 10*3/uL (ref 0.9–3.3)
LYMPH%: 32.7 % (ref 14.0–48.0)
MCH: 33.1 pg (ref 26.0–34.0)
MCHC: 34.7 g/dL (ref 32.0–36.0)
MCV: 95 fL (ref 81–101)
MONO#: 0.8 10*3/uL (ref 0.1–0.9)
MONO%: 13.6 % — ABNORMAL HIGH (ref 0.0–13.0)
NEUT#: 2.8 10*3/uL (ref 1.5–6.5)
NEUT%: 46.4 % (ref 39.6–80.0)
Platelets: 311 10*3/uL (ref 145–400)
RBC: 4.35 10*6/uL (ref 3.70–5.32)
RDW: 12.9 % (ref 11.1–15.7)
WBC: 6.1 10*3/uL (ref 3.9–10.0)

## 2013-05-14 LAB — PROTIME-INR (CHCC SATELLITE)
INR: 2.2 (ref 2.0–3.5)
Protime: 26.4 Seconds — ABNORMAL HIGH (ref 10.6–13.4)

## 2013-05-15 ENCOUNTER — Telehealth: Payer: Self-pay | Admitting: Nurse Practitioner

## 2013-05-15 NOTE — Telephone Encounter (Addendum)
Message copied by Jimmy Footman on Thu May 15, 2013 10:21 AM ------      Message from: Burney Gauze R      Created: Wed May 14, 2013  9:44 PM       Call - INR is ok.  Pete ------pt verbalized understanding and appreciation.

## 2013-06-26 ENCOUNTER — Encounter: Payer: Self-pay | Admitting: Hematology & Oncology

## 2013-06-26 ENCOUNTER — Telehealth: Payer: Self-pay | Admitting: Hematology & Oncology

## 2013-06-26 ENCOUNTER — Other Ambulatory Visit (HOSPITAL_BASED_OUTPATIENT_CLINIC_OR_DEPARTMENT_OTHER): Payer: Medicare Other | Admitting: Lab

## 2013-06-26 ENCOUNTER — Ambulatory Visit (HOSPITAL_BASED_OUTPATIENT_CLINIC_OR_DEPARTMENT_OTHER): Payer: Medicare Other | Admitting: Hematology & Oncology

## 2013-06-26 VITALS — BP 140/58 | HR 60 | Temp 98.3°F | Resp 14 | Ht 64.0 in | Wt 151.0 lb

## 2013-06-26 DIAGNOSIS — D6859 Other primary thrombophilia: Secondary | ICD-10-CM

## 2013-06-26 DIAGNOSIS — Z7901 Long term (current) use of anticoagulants: Secondary | ICD-10-CM

## 2013-06-26 DIAGNOSIS — D6861 Antiphospholipid syndrome: Secondary | ICD-10-CM

## 2013-06-26 DIAGNOSIS — Z86718 Personal history of other venous thrombosis and embolism: Secondary | ICD-10-CM

## 2013-06-26 DIAGNOSIS — K7460 Unspecified cirrhosis of liver: Secondary | ICD-10-CM

## 2013-06-26 DIAGNOSIS — I82409 Acute embolism and thrombosis of unspecified deep veins of unspecified lower extremity: Secondary | ICD-10-CM

## 2013-06-26 DIAGNOSIS — H6690 Otitis media, unspecified, unspecified ear: Secondary | ICD-10-CM

## 2013-06-26 LAB — CBC WITH DIFFERENTIAL (CANCER CENTER ONLY)
BASO#: 0.1 10*3/uL (ref 0.0–0.2)
BASO%: 0.7 % (ref 0.0–2.0)
EOS%: 7.5 % — ABNORMAL HIGH (ref 0.0–7.0)
Eosinophils Absolute: 0.6 10*3/uL — ABNORMAL HIGH (ref 0.0–0.5)
HCT: 42.6 % (ref 34.8–46.6)
HGB: 14.8 g/dL (ref 11.6–15.9)
LYMPH#: 1.9 10*3/uL (ref 0.9–3.3)
LYMPH%: 25.5 % (ref 14.0–48.0)
MCH: 33.1 pg (ref 26.0–34.0)
MCHC: 34.7 g/dL (ref 32.0–36.0)
MCV: 95 fL (ref 81–101)
MONO#: 0.8 10*3/uL (ref 0.1–0.9)
MONO%: 11.1 % (ref 0.0–13.0)
NEUT#: 4.1 10*3/uL (ref 1.5–6.5)
NEUT%: 55.2 % (ref 39.6–80.0)
Platelets: 318 10*3/uL (ref 145–400)
RBC: 4.47 10*6/uL (ref 3.70–5.32)
RDW: 12.9 % (ref 11.1–15.7)
WBC: 7.4 10*3/uL (ref 3.9–10.0)

## 2013-06-26 LAB — CMP (CANCER CENTER ONLY)
ALT(SGPT): 24 U/L (ref 10–47)
AST: 22 U/L (ref 11–38)
Albumin: 3.6 g/dL (ref 3.3–5.5)
Alkaline Phosphatase: 70 U/L (ref 26–84)
BUN, Bld: 17 mg/dL (ref 7–22)
CO2: 32 mEq/L (ref 18–33)
Calcium: 8.7 mg/dL (ref 8.0–10.3)
Chloride: 99 mEq/L (ref 98–108)
Creat: 0.8 mg/dl (ref 0.6–1.2)
Glucose, Bld: 175 mg/dL — ABNORMAL HIGH (ref 73–118)
Potassium: 3.9 mEq/L (ref 3.3–4.7)
Sodium: 139 mEq/L (ref 128–145)
Total Bilirubin: 1.5 mg/dl (ref 0.20–1.60)
Total Protein: 7 g/dL (ref 6.4–8.1)

## 2013-06-26 LAB — PROTIME-INR (CHCC SATELLITE)
INR: 2 (ref 2.0–3.5)
Protime: 24 Seconds — ABNORMAL HIGH (ref 10.6–13.4)

## 2013-06-26 LAB — AFP TUMOR MARKER: AFP-Tumor Marker: 1.3 ng/mL (ref 0.0–8.0)

## 2013-06-26 MED ORDER — AMOXICILLIN 500 MG PO CAPS
ORAL_CAPSULE | ORAL | Status: DC
Start: 2013-06-26 — End: 2013-09-24

## 2013-06-26 NOTE — Telephone Encounter (Signed)
Left message with 4-9 appointment and to pick up schedule when she comes on the 9th

## 2013-06-26 NOTE — Progress Notes (Signed)
  DIAGNOSIS: 1. Antiphospholipid antibody syndrome. 2. History of recurrent lower extremity deep venous thrombosis. 3. Nonalcoholic steatohepatitis.   CURRENT THERAPY:  Coumadin to maintain an INR between 2-3.   INTERIM HISTORY:  [name] in for followup. We last saw her  back in January.    She still is having issues. She has problems with her blood sugars. She probably is having problems with the metformin that she takes. She says that she sees the capsules in her stool.    She's also been your problems. She is chronically painful left ear. She's been seen by ENT. That has helped.    She is doing well with Coumadin. She's not having any bleeding. There's no leg swelling.    She does have been steatohepatitis. She's worried about her liver family. I reassured her.   PHYSICAL EXAMINATION:  Will order female. Vital signs show temperature of 98.3. Blood pressure 140/58. Pulse 60. Weight 151 pounds. Lungs are clear. Cardiac exam regular in rhythm. Abdomen soft. She multiple laparotomy scars. She has a slightly. This him chronic tenderness in the right upper quadrant. No fluid wave. No palpable hepatomegaly. Back exam no tenderness over the spine ribs or hips. Extremities shows no clubbing. No venous cord is noted. Skin exam no rashes. Ear exam shows some slight scarring of the left tympanic membrane.      IMPRESSION:  Laurie Green is a 68 year old female. She is doing well from I believe. Her blood clotting issues have not been a problem. She lifelong Coumadin. Her INR is 2.0.    We will go ahead and do no have her come back to see [place] in one month for blood work only.    I will plan to see her back in 2 months.   [name], MD 06/26/2013

## 2013-07-03 ENCOUNTER — Other Ambulatory Visit: Payer: Self-pay | Admitting: *Deleted

## 2013-07-03 ENCOUNTER — Other Ambulatory Visit: Payer: Medicare Other | Admitting: Lab

## 2013-07-03 DIAGNOSIS — R300 Dysuria: Secondary | ICD-10-CM

## 2013-07-04 ENCOUNTER — Other Ambulatory Visit: Payer: Self-pay | Admitting: *Deleted

## 2013-07-04 LAB — URINALYSIS, ROUTINE W REFLEX MICROSCOPIC
Bilirubin Urine: NEGATIVE
Glucose, UA: 100 mg/dL — AB
Hgb urine dipstick: NEGATIVE
Ketones, ur: NEGATIVE mg/dL
Leukocytes, UA: NEGATIVE
Nitrite: NEGATIVE
Protein, ur: NEGATIVE mg/dL
Specific Gravity, Urine: 1.015 (ref 1.005–1.030)
Urobilinogen, UA: 1 mg/dL (ref 0.0–1.0)
pH: 7 (ref 5.0–8.0)

## 2013-07-04 LAB — URINE CULTURE

## 2013-07-04 MED ORDER — PHENAZOPYRIDINE HCL 200 MG PO TABS: 200.0000 mg | ORAL_TABLET | Freq: Three times a day (TID) | ORAL | Status: DC | PRN

## 2013-07-04 NOTE — Telephone Encounter (Signed)
Patient complaining of burning and pain upon urination.  UA negative for infection.  Dr. Niel Hummer wanted patient to receive Pyridium. RX sent to patients pharmacy

## 2013-07-24 ENCOUNTER — Other Ambulatory Visit (HOSPITAL_BASED_OUTPATIENT_CLINIC_OR_DEPARTMENT_OTHER): Payer: Medicare Other | Admitting: Lab

## 2013-07-24 DIAGNOSIS — Z86718 Personal history of other venous thrombosis and embolism: Secondary | ICD-10-CM

## 2013-07-24 DIAGNOSIS — I82409 Acute embolism and thrombosis of unspecified deep veins of unspecified lower extremity: Secondary | ICD-10-CM

## 2013-07-24 DIAGNOSIS — D6859 Other primary thrombophilia: Secondary | ICD-10-CM

## 2013-07-24 LAB — PROTIME-INR (CHCC SATELLITE)
INR: 1.7 — ABNORMAL LOW (ref 2.0–3.5)
Protime: 20.4 Seconds — ABNORMAL HIGH (ref 10.6–13.4)

## 2013-07-28 ENCOUNTER — Telehealth: Payer: Self-pay | Admitting: Nurse Practitioner

## 2013-07-28 ENCOUNTER — Other Ambulatory Visit: Payer: Self-pay | Admitting: Nurse Practitioner

## 2013-07-28 DIAGNOSIS — I82409 Acute embolism and thrombosis of unspecified deep veins of unspecified lower extremity: Secondary | ICD-10-CM

## 2013-07-28 NOTE — Telephone Encounter (Addendum)
Message copied by Jimmy Footman on Mon Jul 28, 2013 11:00 AM ------      Message from: [name]      Created: Fri Jul 25, 2013  6:26 AM       Call - INR is too low.  How much coumadin is she taking?  Pete ------Pt states she is taking 7.27m of Coumadin daily. Will come in for f/u labwork on 4/20 @ 1330. She verbalized understanding.

## 2013-08-04 ENCOUNTER — Other Ambulatory Visit (HOSPITAL_BASED_OUTPATIENT_CLINIC_OR_DEPARTMENT_OTHER): Payer: Medicare Other | Admitting: Lab

## 2013-08-04 DIAGNOSIS — I82409 Acute embolism and thrombosis of unspecified deep veins of unspecified lower extremity: Secondary | ICD-10-CM

## 2013-08-04 LAB — PROTIME-INR (CHCC SATELLITE)
INR: 1.8 — ABNORMAL LOW (ref 2.0–3.5)
Protime: 21.6 Seconds — ABNORMAL HIGH (ref 10.6–13.4)

## 2013-08-06 ENCOUNTER — Telehealth: Payer: Self-pay | Admitting: Hematology & Oncology

## 2013-08-06 ENCOUNTER — Telehealth: Payer: Self-pay | Admitting: Nurse Practitioner

## 2013-08-06 NOTE — Telephone Encounter (Addendum)
Message copied by Jimmy Footman on Wed Aug 06, 2013 11:01 AM ------      Message from: Burney Gauze R      Created: Mon Aug 04, 2013  6:49 PM       Please call and let her know that her INR is still a little. How much Coumadin is she taking?? Thanks. Pete ------Per Dr. Marin Olp pt will continue on 7.12m of Coumadin and come in next week to have labs done. [facility] She verbalized understanding of instructions.

## 2013-08-06 NOTE — Telephone Encounter (Signed)
Pt made 4-29 lab

## 2013-08-11 ENCOUNTER — Telehealth: Payer: Self-pay | Admitting: Hematology & Oncology

## 2013-08-11 NOTE — Telephone Encounter (Signed)
PT MOVED 4-29 LAB TO 4-28

## 2013-08-12 ENCOUNTER — Other Ambulatory Visit: Payer: Self-pay | Admitting: Hematology & Oncology

## 2013-08-12 ENCOUNTER — Other Ambulatory Visit (HOSPITAL_BASED_OUTPATIENT_CLINIC_OR_DEPARTMENT_OTHER): Payer: Medicare Other | Admitting: Lab

## 2013-08-12 DIAGNOSIS — I82409 Acute embolism and thrombosis of unspecified deep veins of unspecified lower extremity: Secondary | ICD-10-CM

## 2013-08-12 LAB — PROTIME-INR (CHCC SATELLITE)
INR: 1.7 — ABNORMAL LOW (ref 2.0–3.5)
Protime: 20.4 Seconds — ABNORMAL HIGH (ref 10.6–13.4)

## 2013-08-13 ENCOUNTER — Telehealth: Payer: Self-pay | Admitting: *Deleted

## 2013-08-13 ENCOUNTER — Other Ambulatory Visit: Payer: Medicare Other | Admitting: Lab

## 2013-08-13 NOTE — Telephone Encounter (Addendum)
Message copied by Lenn Sink on Wed Aug 13, 2013 10:52 AM ------      Message from: [name]      Created: Tue Aug 12, 2013  8:57 PM       Call - PT is stil low.  How much coumadin is she taking??  pete ------Left voicemail with pt informing her the Dr. Marin Olp does not want to change Coumadin dosage.

## 2013-08-21 ENCOUNTER — Other Ambulatory Visit: Payer: Medicare Other | Admitting: Lab

## 2013-08-21 ENCOUNTER — Ambulatory Visit: Payer: Medicare Other | Admitting: Hematology & Oncology

## 2013-08-27 ENCOUNTER — Other Ambulatory Visit (HOSPITAL_BASED_OUTPATIENT_CLINIC_OR_DEPARTMENT_OTHER): Payer: Medicare Other | Admitting: Lab

## 2013-08-27 ENCOUNTER — Ambulatory Visit (HOSPITAL_BASED_OUTPATIENT_CLINIC_OR_DEPARTMENT_OTHER): Payer: Medicare Other | Admitting: Hematology & Oncology

## 2013-08-27 VITALS — BP 126/51 | HR 63 | Temp 98.1°F | Resp 16

## 2013-08-27 DIAGNOSIS — Z86718 Personal history of other venous thrombosis and embolism: Secondary | ICD-10-CM

## 2013-08-27 DIAGNOSIS — D801 Nonfamilial hypogammaglobulinemia: Secondary | ICD-10-CM

## 2013-08-27 DIAGNOSIS — K7689 Other specified diseases of liver: Secondary | ICD-10-CM

## 2013-08-27 DIAGNOSIS — E119 Type 2 diabetes mellitus without complications: Secondary | ICD-10-CM

## 2013-08-27 DIAGNOSIS — K7581 Nonalcoholic steatohepatitis (NASH): Secondary | ICD-10-CM

## 2013-08-27 DIAGNOSIS — I82409 Acute embolism and thrombosis of unspecified deep veins of unspecified lower extremity: Secondary | ICD-10-CM

## 2013-08-27 LAB — CBC WITH DIFFERENTIAL (CANCER CENTER ONLY)
BASO#: 0.1 10*3/uL (ref 0.0–0.2)
BASO%: 1.4 % (ref 0.0–2.0)
EOS%: 3.1 % (ref 0.0–7.0)
Eosinophils Absolute: 0.2 10*3/uL (ref 0.0–0.5)
HCT: 43.9 % (ref 34.8–46.6)
HGB: 15.5 g/dL (ref 11.6–15.9)
LYMPH#: 1.9 10*3/uL (ref 0.9–3.3)
LYMPH%: 39.2 % (ref 14.0–48.0)
MCH: 33.8 pg (ref 26.0–34.0)
MCHC: 35.3 g/dL (ref 32.0–36.0)
MCV: 96 fL (ref 81–101)
MONO#: 0.7 10*3/uL (ref 0.1–0.9)
MONO%: 14.7 % — ABNORMAL HIGH (ref 0.0–13.0)
NEUT#: 2 10*3/uL (ref 1.5–6.5)
NEUT%: 41.6 % (ref 39.6–80.0)
Platelets: 303 10*3/uL (ref 145–400)
RBC: 4.59 10*6/uL (ref 3.70–5.32)
RDW: 13.3 % (ref 11.1–15.7)
WBC: 4.9 10*3/uL (ref 3.9–10.0)

## 2013-08-27 LAB — CMP (CANCER CENTER ONLY)
ALT(SGPT): 73 U/L — ABNORMAL HIGH (ref 10–47)
AST: 75 U/L — ABNORMAL HIGH (ref 11–38)
Albumin: 3.7 g/dL (ref 3.3–5.5)
Alkaline Phosphatase: 82 U/L (ref 26–84)
BUN, Bld: 15 mg/dL (ref 7–22)
CO2: 31 mEq/L (ref 18–33)
Calcium: 9 mg/dL (ref 8.0–10.3)
Chloride: 99 mEq/L (ref 98–108)
Creat: 0.9 mg/dl (ref 0.6–1.2)
Glucose, Bld: 132 mg/dL — ABNORMAL HIGH (ref 73–118)
Potassium: 4 mEq/L (ref 3.3–4.7)
Sodium: 143 mEq/L (ref 128–145)
Total Bilirubin: 1.1 mg/dl (ref 0.20–1.60)
Total Protein: 7.2 g/dL (ref 6.4–8.1)

## 2013-08-27 LAB — PROTIME-INR (CHCC SATELLITE)
INR: 1.5 — ABNORMAL LOW (ref 2.0–3.5)
Protime: 18 Seconds — ABNORMAL HIGH (ref 10.6–13.4)

## 2013-08-27 MED ORDER — WARFARIN SODIUM 10 MG PO TABS: 10.0000 mg | ORAL_TABLET | Freq: Every day | ORAL | Status: DC

## 2013-08-28 NOTE — Progress Notes (Signed)
Hematology and Oncology Follow Up Visit  Laurie Green [id number] 10/28/1945 68 y.o. 08/28/2013   Principle Diagnosis:  1. Antiphospholipid antibody syndrome. 2. History of recurrent lower extremity deep venous thrombosis. 3. Nonalcoholic steatohepatitis.  Current Therapy:   Coumadin to maintain an INR between 2-3.     Interim History:  Ms.  Green is in for followup. She is doing okay. She has some discomfort in the right upper quadrant. She is always worried about having cancer. She does have cirrhosis. I know she sees a gastroenterologist but doesn't see him all that often.  She's had no bleeding. Her Coumadin has been sub-therapeutic.  She was put on metformin. She had an incredibly hard time with this. She is now off metformin.  She's had occasional diarrhea. She's had no leg swelling. She's had no emesis.  She has fibromyalgia. This has been causing some  problems for her.  Medications: Current outpatient prescriptions:azelastine (ASTELIN) 137 MCG/SPRAY nasal spray, Place 1 spray into the nose as needed., Disp: 30 mL, Rfl: 3;  benazepril (LOTENSIN) 20 MG tablet, Take 20 mg by mouth daily.  , Disp: , Rfl: ;  COUMADIN 7.5 MG tablet, TAKE 1 TABLET BY MOUTH ONCE DAILY, Disp: 30 tablet, Rfl: 3;  DIGESTIVE ENZYMES PO, Take 1 capsule by mouth daily. Astaxanthin, Disp: , Rfl:  Ergocalciferol (VITAMIN D2) 2000 UNITS TABS, Take 1 tablet by mouth daily.  , Disp: , Rfl: ;  glucose 4 GM chewable tablet, Chew 16 g by mouth as needed for low blood sugar., Disp: , Rfl: ;  OIL OF OREGANO PO, Take 2 drops by mouth 2 (two) times daily., Disp: , Rfl: ;  omeprazole (PRILOSEC OTC) 20 MG tablet, Take 20 mg by mouth daily as needed (acid reflux). , Disp: , Rfl:  propranolol (INDERAL) 80 MG tablet, Take 80 mg by mouth daily.  , Disp: , Rfl: ;  amoxicillin (AMOXIL) 500 MG capsule, Take 1 capsule twice a day for 7 days, Disp: 14 capsule, Rfl: 1;  metFORMIN (GLUCOPHAGE) 500 MG tablet, Take 500 mg by mouth  daily., Disp: , Rfl: ;  phenazopyridine (PYRIDIUM) 200 MG tablet, Take 1 tablet (200 mg total) by mouth 3 (three) times daily as needed for pain., Disp: 10 tablet, Rfl: 0 warfarin (COUMADIN) 10 MG tablet, Take 1 tablet (10 mg total) by mouth daily., Disp: 30 tablet, Rfl: 2  Allergies:  Allergies  Allergen Reactions  . Caine-1 [Lidocaine Hcl] Anaphylaxis  . Zosyn [Piperacillin Sod-Tazobactam So] Itching    "scalp itch"-was given Benadryl to counteract.  . Codeine Nausea And Vomiting  . Dilaudid [Hydromorphone Hcl] Nausea And Vomiting    Can take with zofran  . Morphine And Related Nausea And Vomiting  . Percocet [Oxycodone-Acetaminophen] Nausea And Vomiting  . Tramadol Nausea And Vomiting  . Vicodin [Hydrocodone-Acetaminophen] Nausea And Vomiting    Past Medical History, Surgical history, Social history, and Family History were reviewed and updated.  Review of Systems: As above  Physical Exam:  oral temperature is 98.1 F (36.7 C). Her blood pressure is 126/51 and her pulse is 63. Her respiration is 16.   Well-developed and well-nourished white female. Head and neck exam shows no ocular or oral lesions. There are no palpable cervical or supraclavicular lymph nodes. Lungs are clear. Cardiac exam regular rate rhythm. Abdomen soft. Has good bowel sounds. There is slight tenderness in the right upper quadrant. I cannot palpate her liver. 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 She has multiple laparotomy scars. Back exam no  tenderness over the spine ribs or hips. Extremities no clubbing cyanosis or edema. Skin exam no rashes. Neurological exam is nonfocal.  Lab Results  Component Value Date   WBC 4.9 08/27/2013   HGB 15.5 08/27/2013   HCT 43.9 08/27/2013   MCV 96 08/27/2013   PLT 303 08/27/2013     Chemistry      Component Value Date/Time   NA 143 08/27/2013 1441   NA 139 01/16/2013 1521   K 4.0 08/27/2013 1441   K 4.0 01/16/2013 1521   CL 99 08/27/2013 1441   CL 102 01/16/2013 1521   CO2 31  08/27/2013 1441   CO2 28 01/16/2013 1521   BUN 15 08/27/2013 1441   BUN 17 01/16/2013 1521   CREATININE 0.9 08/27/2013 1441   CREATININE 0.80 01/16/2013 1521      Component Value Date/Time   CALCIUM 9.0 08/27/2013 1441   CALCIUM 9.5 01/16/2013 1521   ALKPHOS 82 08/27/2013 1441   ALKPHOS 75 01/16/2013 1521   AST 75* 08/27/2013 1441   AST 23 01/16/2013 1521   ALT 73* 08/27/2013 1441   ALT 21 01/16/2013 1521   BILITOT 1.10 08/27/2013 1441   BILITOT 1.1 01/16/2013 1521         Impression and Plan: Laurie Green is 68 year old white female. She has the anti-phospholipid antibody syndrome. She has not had any issues with thrombus formation for probably 10 years. I think her biggest issue is the diabetes. Also, she has the cirrhosis from NASH.  We will get a MRI. I think this will give her some peace of mind. I really don't think that there is any issues with respect to developing a malignancy.  Have to increase her Coumadin at 10 mg a day. We'll recheck her INR in one week.  I will see her back myself in about one month.  I spent about half hour for today. Again I tried to reassure her that she was not having any issues with cancer. She really has to be seeing her gastroenterologist more often but I still think she will do that.  [name], MD 5/14/20156:51 AM

## 2013-09-03 ENCOUNTER — Other Ambulatory Visit (HOSPITAL_BASED_OUTPATIENT_CLINIC_OR_DEPARTMENT_OTHER): Payer: Medicare Other | Admitting: Lab

## 2013-09-03 DIAGNOSIS — I82409 Acute embolism and thrombosis of unspecified deep veins of unspecified lower extremity: Secondary | ICD-10-CM

## 2013-09-03 DIAGNOSIS — D6859 Other primary thrombophilia: Secondary | ICD-10-CM

## 2013-09-03 LAB — PROTIME-INR (CHCC SATELLITE)
INR: 2.5 (ref 2.0–3.5)
Protime: 30 Seconds — ABNORMAL HIGH (ref 10.6–13.4)

## 2013-09-04 ENCOUNTER — Ambulatory Visit
Admission: RE | Admit: 2013-09-04 | Discharge: 2013-09-04 | Disposition: A | Discharge status: Home / Self Care | Payer: Medicare Other | Source: Ambulatory Visit | Attending: Hematology & Oncology | Admitting: Hematology & Oncology

## 2013-09-04 ENCOUNTER — Telehealth: Payer: Self-pay | Admitting: *Deleted

## 2013-09-04 ENCOUNTER — Ambulatory Visit (HOSPITAL_COMMUNITY): Payer: Medicare Other

## 2013-09-04 DIAGNOSIS — K7581 Nonalcoholic steatohepatitis (NASH): Secondary | ICD-10-CM

## 2013-09-04 MED ORDER — GADOXETATE DISODIUM 0.25 MMOL/ML IV SOLN
7.0000 mL | Freq: Once | INTRAVENOUS | Status: AC | PRN
  Administered 2013-09-04: 7 mL via INTRAVENOUS

## 2013-09-04 NOTE — Telephone Encounter (Addendum)
Message copied by Lenn Sink on Thu Sep 04, 2013  9:03 AM ------      Message from: Burney Gauze R      Created: Wed Sep 03, 2013  7:02 PM       Please call and let her know that the Coumadin time is perfect. Make sure that this is repeated in 2 or 3 weeks. Thanks. Pete ------Informed pt that Coumadin level is good. Will see pt in two to three weeks.

## 2013-09-18 ENCOUNTER — Other Ambulatory Visit (HOSPITAL_BASED_OUTPATIENT_CLINIC_OR_DEPARTMENT_OTHER): Payer: Medicare Other | Admitting: Lab

## 2013-09-18 DIAGNOSIS — I82409 Acute embolism and thrombosis of unspecified deep veins of unspecified lower extremity: Secondary | ICD-10-CM

## 2013-09-18 LAB — PROTIME-INR (CHCC SATELLITE)
INR: 2.9 (ref 2.0–3.5)
Protime: 34.8 Seconds — ABNORMAL HIGH (ref 10.6–13.4)

## 2013-09-19 ENCOUNTER — Encounter: Payer: Self-pay | Admitting: *Deleted

## 2013-09-19 NOTE — Progress Notes (Signed)
Left voicemail informing pt that Dr Marin Olp wants her to alternate Coumadin doses and take 7.64m one day and 14mthe next day. Pt is to continue this pattern until she returns to our office.

## 2013-09-24 ENCOUNTER — Encounter: Payer: Self-pay | Admitting: Hematology & Oncology

## 2013-09-24 ENCOUNTER — Ambulatory Visit (HOSPITAL_BASED_OUTPATIENT_CLINIC_OR_DEPARTMENT_OTHER): Payer: Medicare Other | Admitting: Hematology & Oncology

## 2013-09-24 ENCOUNTER — Other Ambulatory Visit (HOSPITAL_BASED_OUTPATIENT_CLINIC_OR_DEPARTMENT_OTHER): Payer: Medicare Other | Admitting: Lab

## 2013-09-24 VITALS — BP 146/72 | HR 65 | Temp 97.9°F | Resp 14 | Ht 64.0 in | Wt 147.0 lb

## 2013-09-24 DIAGNOSIS — K7689 Other specified diseases of liver: Secondary | ICD-10-CM

## 2013-09-24 DIAGNOSIS — D6859 Other primary thrombophilia: Secondary | ICD-10-CM

## 2013-09-24 DIAGNOSIS — I82409 Acute embolism and thrombosis of unspecified deep veins of unspecified lower extremity: Secondary | ICD-10-CM

## 2013-09-24 DIAGNOSIS — K7581 Nonalcoholic steatohepatitis (NASH): Secondary | ICD-10-CM

## 2013-09-24 DIAGNOSIS — Z86718 Personal history of other venous thrombosis and embolism: Secondary | ICD-10-CM

## 2013-09-24 DIAGNOSIS — Z7901 Long term (current) use of anticoagulants: Secondary | ICD-10-CM

## 2013-09-24 DIAGNOSIS — D6861 Antiphospholipid syndrome: Secondary | ICD-10-CM

## 2013-09-24 LAB — CBC WITH DIFFERENTIAL/PLATELET
Basophils Absolute: 0.1 10*3/uL (ref 0.0–0.1)
Basophils Relative: 1 % (ref 0–1)
Eosinophils Absolute: 0.3 10*3/uL (ref 0.0–0.7)
Eosinophils Relative: 4 % (ref 0–5)
HCT: 39.7 % (ref 36.0–46.0)
Hemoglobin: 14.4 g/dL (ref 12.0–15.0)
Lymphocytes Relative: 40 % (ref 12–46)
Lymphs Abs: 2.7 10*3/uL (ref 0.7–4.0)
MCH: 33.3 pg (ref 26.0–34.0)
MCHC: 36.3 g/dL — ABNORMAL HIGH (ref 30.0–36.0)
MCV: 91.7 fL (ref 78.0–100.0)
Monocytes Absolute: 0.7 10*3/uL (ref 0.1–1.0)
Monocytes Relative: 11 % (ref 3–12)
Neutro Abs: 3 10*3/uL (ref 1.7–7.7)
Neutrophils Relative %: 44 % (ref 43–77)
Platelets: 361 10*3/uL (ref 150–400)
RBC: 4.33 MIL/uL (ref 3.87–5.11)
RDW: 12.9 % (ref 11.5–15.5)
WBC: 6.8 10*3/uL (ref 4.0–10.5)

## 2013-09-24 LAB — COMPREHENSIVE METABOLIC PANEL
ALT: 31 U/L (ref 0–35)
AST: 31 U/L (ref 0–37)
Albumin: 3.9 g/dL (ref 3.5–5.2)
Alkaline Phosphatase: 91 U/L (ref 39–117)
BUN: 16 mg/dL (ref 6–23)
CO2: 28 mEq/L (ref 19–32)
Calcium: 9.3 mg/dL (ref 8.4–10.5)
Chloride: 104 mEq/L (ref 96–112)
Creatinine, Ser: 0.81 mg/dL (ref 0.50–1.10)
Glucose, Bld: 158 mg/dL — ABNORMAL HIGH (ref 70–99)
Potassium: 3.9 mEq/L (ref 3.5–5.3)
Sodium: 139 mEq/L (ref 135–145)
Total Bilirubin: 1.3 mg/dL — ABNORMAL HIGH (ref 0.2–1.2)
Total Protein: 7.1 g/dL (ref 6.0–8.3)

## 2013-09-24 LAB — PROTIME-INR (CHCC SATELLITE)
INR: 2 (ref 2.0–3.5)
Protime: 24 Seconds — ABNORMAL HIGH (ref 10.6–13.4)

## 2013-09-24 NOTE — Progress Notes (Signed)
Hematology and Oncology Follow Up Visit  Laurie Green [id number] 26-Apr-1945 68 y.o. 09/24/2013   Principle Diagnosis:  1. Antiphospholipid antibody syndrome. 2. History of recurrent lower extremity deep venous thrombosis. 3. Nonalcoholic steatohepatitis.  Current Therapy:   Coumadin to maintain an INR between 2-3.     Interim History:  Ms.  Green is for followup. She did have a MRI of the abdomen done. 2000 some abdominal issues. With her cirrhosis, and there is the concern about hepatocellular carcinoma.  The MRI did not show any evidence of hepatocellular carcinoma. She had a cirrhosis. She had varices. She had an 8 mm lesion in the pancreas in the uncinate process. She also a 7 mm lesion in the tail of the pancreas. Of course, and the radiologist felt that followup would be necessary. We will get another MRI in 6 months.  She's had no bleeding. She is watching her blood sugars. She is not taking any added sugar in her diet She is diabetic. She is trying to go within all natural-type diet.  She currently is on Coumadin 7.5 mg a day alternating with 10 mg a day. Medications: Current outpatient prescriptions:azelastine (ASTELIN) 137 MCG/SPRAY nasal spray, Place 1 spray into the nose as needed., Disp: 30 mL, Rfl: 3;  benazepril (LOTENSIN) 20 MG tablet, Take 20 mg by mouth daily.  , Disp: , Rfl: ;  DIGESTIVE ENZYMES PO, Take 1 capsule by mouth daily. Astaxanthin, Disp: , Rfl: ;  Ergocalciferol (VITAMIN D2) 2000 UNITS TABS, Take 1 tablet by mouth daily.  , Disp: , Rfl:  glucose 4 GM chewable tablet, Chew 16 g by mouth as needed for low blood sugar., Disp: , Rfl: ;  OIL OF OREGANO PO, Take 2 drops by mouth 2 (two) times daily., Disp: , Rfl: ;  omeprazole (PRILOSEC OTC) 20 MG tablet, Take 20 mg by mouth daily as needed (acid reflux). , Disp: , Rfl: ;  propranolol (INDERAL) 80 MG tablet, Take 80 mg by mouth daily.  , Disp: , Rfl:  warfarin (COUMADIN) 7.5 MG tablet, TAKES 7.5 MG ONE DAY AND 10  MG THE NEXT DAY AND REPEAT, Disp: , Rfl: ;  metFORMIN (GLUCOPHAGE) 500 MG tablet, Take 500 mg by mouth daily. PT D/C MED ON HER OWN, Disp: , Rfl: ;  phenazopyridine (PYRIDIUM) 200 MG tablet, Take 1 tablet (200 mg total) by mouth 3 (three) times daily as needed for pain., Disp: 10 tablet, Rfl: 0  Allergies:  Allergies  Allergen Reactions  . Caine-1 [Lidocaine Hcl] Anaphylaxis  . Morphine Anaphylaxis  . Zosyn [Piperacillin Sod-Tazobactam So] Itching    "scalp itch"-was given Benadryl to counteract.  . Codeine Nausea And Vomiting and Other (See Comments)  . Dilaudid [Hydromorphone Hcl] Nausea And Vomiting    Can take with zofran  . Morphine And Related Nausea And Vomiting  . Percocet [Oxycodone-Acetaminophen] Nausea And Vomiting  . Tramadol Nausea And Vomiting  . Vicodin [Hydrocodone-Acetaminophen] Nausea And Vomiting    Past Medical History, Surgical history, Social history, and Family History were reviewed and updated.  Review of Systems: As above  Physical Exam:  height is 5' 4"  (1.626 m) and weight is 147 lb (66.679 kg). Her oral temperature is 97.9 F (36.6 C). Her blood pressure is 146/72 and her pulse is 65. Her respiration is 14.   Well-developed and well-nourished white female. There are no ocular or oral lesions. She has no palpable cervical or supraclavicular lymph nodes. Lungs are clear. Cardiac exam regular in rhythm with  no murmurs rubs or bruits. Abdomen is soft. Good bowel sounds. Multiple laparotomy scars. She's had a splenectomy. She has no fluid wave. There is no palpable liver edge. Extremities shows no clubbing cyanosis or edema. Skin exam no rashes, ecchymoses or petechia. She has some areas of hemangiomas which I told her would not cause her any problems. Neurological exam is nonfocal.  Lab Results  Component Value Date   WBC 4.9 08/27/2013   HGB 15.5 08/27/2013   HCT 43.9 08/27/2013   MCV 96 08/27/2013   PLT 303 08/27/2013     Chemistry      Component Value  Date/Time   NA 143 08/27/2013 1441   NA 139 01/16/2013 1521   K 4.0 08/27/2013 1441   K 4.0 01/16/2013 1521   CL 99 08/27/2013 1441   CL 102 01/16/2013 1521   CO2 31 08/27/2013 1441   CO2 28 01/16/2013 1521   BUN 15 08/27/2013 1441   BUN 17 01/16/2013 1521   CREATININE 0.9 08/27/2013 1441   CREATININE 0.80 01/16/2013 1521      Component Value Date/Time   CALCIUM 9.0 08/27/2013 1441   CALCIUM 9.5 01/16/2013 1521   ALKPHOS 82 08/27/2013 1441   ALKPHOS 75 01/16/2013 1521   AST 75* 08/27/2013 1441   AST 23 01/16/2013 1521   ALT 73* 08/27/2013 1441   ALT 21 01/16/2013 1521   BILITOT 1.10 08/27/2013 1441   BILITOT 1.1 01/16/2013 1521         Impression and Plan: Laurie Green is 68 year old white female with history of anti-phospholipid antibody syndrome. She's had multiple thrombotic events. She is has not had any thrombotic events probably for about 10 years now. She's doing well on Coumadin. She does have a varices but has never had any bleeding from this.  I have tried to reassure her about the pancreas. I really don't think that these lesions are significant and that they do not need to be biopsied or removed.  We will get an MRI on the is in 6 months.  I will plan to get her back to see [place] in a month. She will be going to the beach as she always does. She's only had a good time down there.   [name], MD 6/10/20155:13 PM

## 2013-09-25 ENCOUNTER — Telehealth: Payer: Self-pay | Admitting: Hematology & Oncology

## 2013-09-25 NOTE — Telephone Encounter (Signed)
Pt aware of 7-10 appointments. Faxed lab results to pt per her request. Per MD that's fine to fax cbc,cmp and inr to [phone] she needs to take to other MD appointment

## 2013-10-16 ENCOUNTER — Other Ambulatory Visit: Payer: Medicare Other | Admitting: Lab

## 2013-10-20 ENCOUNTER — Telehealth: Payer: Self-pay | Admitting: Hematology & Oncology

## 2013-10-20 NOTE — Telephone Encounter (Signed)
Pt called to cx and rs due to dentist procedure this week.

## 2013-10-24 ENCOUNTER — Ambulatory Visit: Payer: Medicare Other | Admitting: Hematology & Oncology

## 2013-10-24 ENCOUNTER — Other Ambulatory Visit: Payer: Medicare Other | Admitting: Lab

## 2013-11-04 ENCOUNTER — Other Ambulatory Visit (HOSPITAL_BASED_OUTPATIENT_CLINIC_OR_DEPARTMENT_OTHER): Payer: Medicare Other | Admitting: Lab

## 2013-11-04 ENCOUNTER — Ambulatory Visit (HOSPITAL_BASED_OUTPATIENT_CLINIC_OR_DEPARTMENT_OTHER): Payer: Medicare Other | Admitting: Hematology & Oncology

## 2013-11-04 VITALS — BP 127/80 | HR 54 | Temp 97.9°F | Resp 16

## 2013-11-04 DIAGNOSIS — K7581 Nonalcoholic steatohepatitis (NASH): Secondary | ICD-10-CM

## 2013-11-04 DIAGNOSIS — I82409 Acute embolism and thrombosis of unspecified deep veins of unspecified lower extremity: Secondary | ICD-10-CM

## 2013-11-04 DIAGNOSIS — D6861 Antiphospholipid syndrome: Secondary | ICD-10-CM

## 2013-11-04 DIAGNOSIS — I82403 Acute embolism and thrombosis of unspecified deep veins of lower extremity, bilateral: Secondary | ICD-10-CM

## 2013-11-04 DIAGNOSIS — R350 Frequency of micturition: Secondary | ICD-10-CM

## 2013-11-04 DIAGNOSIS — D6859 Other primary thrombophilia: Secondary | ICD-10-CM

## 2013-11-04 DIAGNOSIS — N390 Urinary tract infection, site not specified: Secondary | ICD-10-CM

## 2013-11-04 LAB — CBC WITH DIFFERENTIAL (CANCER CENTER ONLY)
BASO#: 0.1 10*3/uL (ref 0.0–0.2)
BASO%: 1.4 % (ref 0.0–2.0)
EOS%: 4.9 % (ref 0.0–7.0)
Eosinophils Absolute: 0.2 10*3/uL (ref 0.0–0.5)
HCT: 42.6 % (ref 34.8–46.6)
HGB: 15 g/dL (ref 11.6–15.9)
LYMPH#: 1.9 10*3/uL (ref 0.9–3.3)
LYMPH%: 39.2 % (ref 14.0–48.0)
MCH: 34 pg (ref 26.0–34.0)
MCHC: 35.2 g/dL (ref 32.0–36.0)
MCV: 97 fL (ref 81–101)
MONO#: 0.7 10*3/uL (ref 0.1–0.9)
MONO%: 14.6 % — ABNORMAL HIGH (ref 0.0–13.0)
NEUT#: 1.9 10*3/uL (ref 1.5–6.5)
NEUT%: 39.9 % (ref 39.6–80.0)
Platelets: 318 10*3/uL (ref 145–400)
RBC: 4.41 10*6/uL (ref 3.70–5.32)
RDW: 12.6 % (ref 11.1–15.7)
WBC: 4.9 10*3/uL (ref 3.9–10.0)

## 2013-11-04 LAB — COMPREHENSIVE METABOLIC PANEL
ALT: 25 U/L (ref 0–35)
AST: 27 U/L (ref 0–37)
Albumin: 4 g/dL (ref 3.5–5.2)
Alkaline Phosphatase: 69 U/L (ref 39–117)
BUN: 13 mg/dL (ref 6–23)
CO2: 27 mEq/L (ref 19–32)
Calcium: 9.2 mg/dL (ref 8.4–10.5)
Chloride: 104 mEq/L (ref 96–112)
Creatinine, Ser: 0.72 mg/dL (ref 0.50–1.10)
Glucose, Bld: 157 mg/dL — ABNORMAL HIGH (ref 70–99)
Potassium: 4.1 mEq/L (ref 3.5–5.3)
Sodium: 140 mEq/L (ref 135–145)
Total Bilirubin: 1.3 mg/dL — ABNORMAL HIGH (ref 0.2–1.2)
Total Protein: 6.8 g/dL (ref 6.0–8.3)

## 2013-11-04 LAB — PROTIME-INR (CHCC SATELLITE)
INR: 2.4 (ref 2.0–3.5)
Protime: 28.8 Seconds — ABNORMAL HIGH (ref 10.6–13.4)

## 2013-11-04 LAB — LACTATE DEHYDROGENASE: LDH: 222 U/L (ref 94–250)

## 2013-11-04 MED ORDER — AMOXICILLIN-POT CLAVULANATE 875-125 MG PO TABS: 1.0000 | ORAL_TABLET | Freq: Two times a day (BID) | ORAL | Status: DC

## 2013-11-05 ENCOUNTER — Telehealth: Payer: Self-pay | Admitting: *Deleted

## 2013-11-05 ENCOUNTER — Telehealth: Payer: Self-pay | Admitting: Hematology & Oncology

## 2013-11-05 NOTE — Progress Notes (Signed)
Hematology and Oncology Follow Up Visit  Laurie Green [id number] Sep 14, 1945 68 y.o. 11/05/2013   Principle Diagnosis:  Antiphospholipid antibody syndrome. 2. History of recurrent lower extremity deep venous thrombosis. 3. Nonalcoholic steatohepatitis.  Current Therapy:   Coumadin to maintain an INR between 2-3.     Interim History:  Ms.  Laurie Green is for followup. She she's been doing 3 Will. She's not had a lot of problems with nausea vomiting. Her blood sugars, I guess, had been doing okay. She is complaining of urinary tract issues., Sure in what we can really do about that. She says that she is not emptying her bladder. Again, I think this is him that her family doctor is going to have to deal with. Unfortunately, Ms. Laurie Green likes to come to [place] with all her problems.  She's had no bleeding. She's doing well on the Coumadin. Her she's had no headache. She's had no cough. She's had no fever.  Medications: Current outpatient prescriptions:benazepril (LOTENSIN) 20 MG tablet, Take 20 mg by mouth daily.  , Disp: , Rfl: ;  DIGESTIVE ENZYMES PO, Take 1 capsule by mouth daily. Astaxanthin, Disp: , Rfl: ;  Ergocalciferol (VITAMIN D2) 2000 UNITS TABS, Take 1 tablet by mouth daily.  , Disp: , Rfl: ;  glucose 4 GM chewable tablet, Chew 16 g by mouth as needed for low blood sugar., Disp: , Rfl:  OIL OF OREGANO PO, Take 2 drops by mouth 2 (two) times daily., Disp: , Rfl: ;  omeprazole (PRILOSEC OTC) 20 MG tablet, Take 20 mg by mouth daily as needed (acid reflux). , Disp: , Rfl: ;  propranolol (INDERAL) 80 MG tablet, Take 80 mg by mouth daily.  , Disp: , Rfl: ;  warfarin (COUMADIN) 7.5 MG tablet, TAKES 7.5 MG ONE DAY AND 10 MG THE NEXT DAY AND REPEAT, Disp: , Rfl:  amoxicillin-clavulanate (AUGMENTIN) 875-125 MG per tablet, Take 1 tablet by mouth 2 (two) times daily., Disp: 10 tablet, Rfl: 0;  azelastine (ASTELIN) 137 MCG/SPRAY nasal spray, Place 1 spray into the nose as needed., Disp: 30 mL, Rfl: 3;   metFORMIN (GLUCOPHAGE) 500 MG tablet, Take 500 mg by mouth daily. PT D/C MED ON HER OWN, Disp: , Rfl:  phenazopyridine (PYRIDIUM) 200 MG tablet, Take 1 tablet (200 mg total) by mouth 3 (three) times daily as needed for pain., Disp: 10 tablet, Rfl: 0  Allergies:  Allergies  Allergen Reactions  . Caine-1 [Lidocaine Hcl] Anaphylaxis  . Morphine Anaphylaxis  . Zosyn [Piperacillin Sod-Tazobactam So] Itching    "scalp itch"-was given Benadryl to counteract.  . Codeine Nausea And Vomiting and Other (See Comments)  . Dilaudid [Hydromorphone Hcl] Nausea And Vomiting    Can take with zofran  . Morphine And Related Nausea And Vomiting  . Percocet [Oxycodone-Acetaminophen] Nausea And Vomiting  . Tramadol Nausea And Vomiting  . Vicodin [Hydrocodone-Acetaminophen] Nausea And Vomiting    Past Medical History, Surgical history, Social history, and Family History were reviewed and updated.  Review of Systems: As above  Physical Exam:  oral temperature is 97.9 F (36.6 C). Her blood pressure is 127/80 and her pulse is 54. Her respiration is 16.   Well-developed and well-nourished white female in no obvious distress. Head and neck exam shows no ocular or oral lesions. There are no palpable cervical or supraclavicular this. Lungs are clear bilateral. Cardiac exam regular in rhythm with no murmurs rubs or bruits. Abdomen soft. She has a slightly scar. She has no hepatomegaly. There is no  fluid wave. There is no palpable liver. Back exam no tenderness over the spine ribs or hips. Extremities shows no clubbing cyanosis or edema. Skin exam no rashes.  Lab Results  Component Value Date   WBC 4.9 11/04/2013   HGB 15.0 11/04/2013   HCT 42.6 11/04/2013   MCV 97 11/04/2013   PLT 318 11/04/2013     Chemistry      Component Value Date/Time   NA 140 11/04/2013 1012   NA 143 08/27/2013 1441   K 4.1 11/04/2013 1012   K 4.0 08/27/2013 1441   CL 104 11/04/2013 1012   CL 99 08/27/2013 1441   CO2 27 11/04/2013 1012    CO2 31 08/27/2013 1441   BUN 13 11/04/2013 1012   BUN 15 08/27/2013 1441   CREATININE 0.72 11/04/2013 1012   CREATININE 0.9 08/27/2013 1441      Component Value Date/Time   CALCIUM 9.2 11/04/2013 1012   CALCIUM 9.0 08/27/2013 1441   ALKPHOS 69 11/04/2013 1012   ALKPHOS 82 08/27/2013 1441   AST 27 11/04/2013 1012   AST 75* 08/27/2013 1441   ALT 25 11/04/2013 1012   ALT 73* 08/27/2013 1441   BILITOT 1.3* 11/04/2013 1012   BILITOT 1.10 08/27/2013 1441     INR is 2.4.    Impression and Plan: Ms. Laurie Green is 68 year old female. She has history of past proptotic. Again she buys I had any thromboembolic disease for over 10 years. She'll lifelong Coumadin.  She is deathly afraid of cancer. She is worried about pancreatic cancer. I tried to reassure her that she does not have any cancer. We have to do another MRI on her in December to rule out any problems with the pancreas as the radiologist are not clear as to what is going on with a pancreas within the head and tail.  We will continue to follow her INR every month.  I will see her back in another 2 months or so.  Am not sure what we need to do about her urinary issues. Again she probably needs to. Her to urology for this evaluation.  [name], MD 7/22/20156:39 PM

## 2013-11-05 NOTE — Telephone Encounter (Signed)
Mailed sept schedule °

## 2013-11-05 NOTE — Telephone Encounter (Addendum)
Message copied by Lenn Sink on Wed Nov 05, 2013 10:49 AM ------      Message from: Burney Gauze R      Created: Wed Nov 05, 2013  7:11 AM       Call - blood sugar is high but liver tests are normal.  pete ------Informed pt that blood sugar was a little high, but liver tests were normal!

## 2013-12-03 ENCOUNTER — Other Ambulatory Visit (HOSPITAL_BASED_OUTPATIENT_CLINIC_OR_DEPARTMENT_OTHER): Payer: Medicare Other | Admitting: Lab

## 2013-12-03 DIAGNOSIS — D6859 Other primary thrombophilia: Secondary | ICD-10-CM

## 2013-12-03 DIAGNOSIS — I82409 Acute embolism and thrombosis of unspecified deep veins of unspecified lower extremity: Secondary | ICD-10-CM

## 2013-12-03 LAB — PROTIME-INR (CHCC SATELLITE)
INR: 2.5 (ref 2.0–3.5)
Protime: 30 Seconds — ABNORMAL HIGH (ref 10.6–13.4)

## 2013-12-04 ENCOUNTER — Telehealth: Payer: Self-pay | Admitting: *Deleted

## 2013-12-04 NOTE — Telephone Encounter (Signed)
Message copied by Rico Ala on Thu Dec 04, 2013 10:53 AM ------      Message from: Burney Gauze R      Created: Thu Dec 04, 2013  7:07 AM       Call - INR is perfect!!!  Laurie Green ------

## 2014-01-06 ENCOUNTER — Encounter: Payer: Self-pay | Admitting: Hematology & Oncology

## 2014-01-06 ENCOUNTER — Other Ambulatory Visit (HOSPITAL_BASED_OUTPATIENT_CLINIC_OR_DEPARTMENT_OTHER): Payer: Medicare Other | Admitting: Lab

## 2014-01-06 ENCOUNTER — Ambulatory Visit (HOSPITAL_BASED_OUTPATIENT_CLINIC_OR_DEPARTMENT_OTHER): Payer: Medicare Other | Admitting: Hematology & Oncology

## 2014-01-06 VITALS — BP 133/81 | HR 62 | Temp 98.1°F | Resp 14 | Ht 64.0 in | Wt 149.0 lb

## 2014-01-06 DIAGNOSIS — I82403 Acute embolism and thrombosis of unspecified deep veins of lower extremity, bilateral: Secondary | ICD-10-CM

## 2014-01-06 DIAGNOSIS — K7689 Other specified diseases of liver: Secondary | ICD-10-CM

## 2014-01-06 DIAGNOSIS — D6859 Other primary thrombophilia: Secondary | ICD-10-CM

## 2014-01-06 DIAGNOSIS — R350 Frequency of micturition: Secondary | ICD-10-CM

## 2014-01-06 DIAGNOSIS — D649 Anemia, unspecified: Secondary | ICD-10-CM

## 2014-01-06 DIAGNOSIS — N390 Urinary tract infection, site not specified: Secondary | ICD-10-CM

## 2014-01-06 DIAGNOSIS — D6861 Antiphospholipid syndrome: Secondary | ICD-10-CM

## 2014-01-06 LAB — CMP (CANCER CENTER ONLY)
ALT(SGPT): 28 U/L (ref 10–47)
AST: 28 U/L (ref 11–38)
Albumin: 3.6 g/dL (ref 3.3–5.5)
Alkaline Phosphatase: 71 U/L (ref 26–84)
BUN, Bld: 16 mg/dL (ref 7–22)
CO2: 28 mEq/L (ref 18–33)
Calcium: 9.2 mg/dL (ref 8.0–10.3)
Chloride: 99 mEq/L (ref 98–108)
Creat: 0.7 mg/dl (ref 0.6–1.2)
Glucose, Bld: 177 mg/dL — ABNORMAL HIGH (ref 73–118)
Potassium: 3.7 mEq/L (ref 3.3–4.7)
Sodium: 142 mEq/L (ref 128–145)
Total Bilirubin: 1.5 mg/dl (ref 0.20–1.60)
Total Protein: 6.9 g/dL (ref 6.4–8.1)

## 2014-01-06 LAB — CBC WITH DIFFERENTIAL (CANCER CENTER ONLY)
BASO#: 0.1 10*3/uL (ref 0.0–0.2)
BASO%: 1 % (ref 0.0–2.0)
EOS%: 3.4 % (ref 0.0–7.0)
Eosinophils Absolute: 0.2 10*3/uL (ref 0.0–0.5)
HCT: 43 % (ref 34.8–46.6)
HGB: 15.3 g/dL (ref 11.6–15.9)
LYMPH#: 1.6 10*3/uL (ref 0.9–3.3)
LYMPH%: 27.7 % (ref 14.0–48.0)
MCH: 33.9 pg (ref 26.0–34.0)
MCHC: 35.6 g/dL (ref 32.0–36.0)
MCV: 95 fL (ref 81–101)
MONO#: 0.7 10*3/uL (ref 0.1–0.9)
MONO%: 12.5 % (ref 0.0–13.0)
NEUT#: 3.3 10*3/uL (ref 1.5–6.5)
NEUT%: 55.4 % (ref 39.6–80.0)
Platelets: 324 10*3/uL (ref 145–400)
RBC: 4.51 10*6/uL (ref 3.70–5.32)
RDW: 13.2 % (ref 11.1–15.7)
WBC: 5.9 10*3/uL (ref 3.9–10.0)

## 2014-01-06 LAB — PROTIME-INR (CHCC SATELLITE)
INR: 2.1 (ref 2.0–3.5)
Protime: 25.2 Seconds — ABNORMAL HIGH (ref 10.6–13.4)

## 2014-01-07 NOTE — Progress Notes (Signed)
Hematology and Oncology Follow Up Visit  Laurie Green [id number] [birth year] 68 y.o. 01/07/2014   Principle Diagnosis:  1. Antiphospholipid antibody syndrome. 2. History of recurrent lower extremity deep venous thrombosis. 3. Nonalcoholic steatohepatitis.  Current Therapy:   Coumadin to maintain an INR between 2-3.     Interim History:  [name] is back for followup. She's doing about the same. She still were about her pancreas. I think she's due for a MRI in the next few months.  She's doing well on the Coumadin. She's had no bleeding. He's had no blood clots in her leg.  Her fibromyalgia is bother her quite a bit. She takes a natural remedy for this-oil of oregano.  She's had no cough. She's had no shortness of breath. She's had no nausea vomiting. She's had no change in bowel or bladder habits.  Medications: Current outpatient prescriptions:benazepril (LOTENSIN) 20 MG tablet, Take 20 mg by mouth daily.  , Disp: , Rfl: ;  DIGESTIVE ENZYMES PO, Take 1 capsule by mouth daily. Astaxanthin, Disp: , Rfl: ;  Ergocalciferol (VITAMIN D2) 2000 UNITS TABS, Take 1 tablet by mouth daily.  , Disp: , Rfl: ;  glucose 4 GM chewable tablet, Chew 16 g by mouth as needed for low blood sugar., Disp: , Rfl:  OIL OF OREGANO PO, Take 2 drops by mouth 2 (two) times daily., Disp: , Rfl: ;  omeprazole (PRILOSEC OTC) 20 MG tablet, Take 20 mg by mouth daily as needed (acid reflux). , Disp: , Rfl: ;  propranolol (INDERAL) 80 MG tablet, Take 80 mg by mouth daily.  , Disp: , Rfl: ;  warfarin (COUMADIN) 7.5 MG tablet, TAKES 7.5 MG ONE DAY AND 10 MG THE NEXT DAY AND REPEAT, Disp: , Rfl:   Allergies:  Allergies  Allergen Reactions  . Caine-1 [Lidocaine Hcl] Anaphylaxis  . Morphine Anaphylaxis  . Zosyn [Piperacillin Sod-Tazobactam So] Itching    "scalp itch"-was given Benadryl to counteract.  . Codeine Nausea And Vomiting and Other (See Comments)  . Dilaudid [Hydromorphone Hcl] Nausea And Vomiting    Can  take with zofran  . Morphine And Related Nausea And Vomiting  . Percocet [Oxycodone-Acetaminophen] Nausea And Vomiting  . Tramadol Nausea And Vomiting  . Vicodin [Hydrocodone-Acetaminophen] Nausea And Vomiting    Past Medical History, Surgical history, Social history, and Family History were reviewed and updated.  Review of Systems: As above  Physical Exam:  height is 5' 4"  (1.626 m) and weight is 149 lb (67.586 kg). Her oral temperature is 98.1 F (36.7 C). Her blood pressure is 133/81 and her pulse is 62. Her respiration is 14.   Well-developed and well-nourished white female in no obvious distress. Head and neck exam shows no ocular or oral lesions. There are no palpable cervical or supraclavicular this. Lungs are clear bilateral. Cardiac exam regular in rhythm with no murmurs rubs or bruits. Abdomen soft. She has a slightly scar. She has no hepatomegaly. There is no fluid wave. There is no palpable liver. Back exam no tenderness over the spine ribs or hips. Extremities shows no clubbing cyanosis or edema. Skin exam no rashes.  Lab Results  Component Value Date   WBC 5.9 01/06/2014   HGB 15.3 01/06/2014   HCT 43.0 01/06/2014   MCV 95 01/06/2014   PLT 324 01/06/2014     Chemistry      Component Value Date/Time   NA 142 01/06/2014 1013   NA 140 11/04/2013 1012   K 3.7 01/06/2014  1013   K 4.1 11/04/2013 1012   CL 99 01/06/2014 1013   CL 104 11/04/2013 1012   CO2 28 01/06/2014 1013   CO2 27 11/04/2013 1012   BUN 16 01/06/2014 1013   BUN 13 11/04/2013 1012   CREATININE 0.7 01/06/2014 1013   CREATININE 0.72 11/04/2013 1012      Component Value Date/Time   CALCIUM 9.2 01/06/2014 1013   CALCIUM 9.2 11/04/2013 1012   ALKPHOS 71 01/06/2014 1013   ALKPHOS 69 11/04/2013 1012   AST 28 01/06/2014 1013   AST 27 11/04/2013 1012   ALT 28 01/06/2014 1013   ALT 25 11/04/2013 1012   BILITOT 1.50 01/06/2014 1013   BILITOT 1.3* 11/04/2013 1012         Impression and Plan: Laurie Green is 68 year old  white female with the anti-phospholipid antibody syndrome. This really has not been a problem for her. The bigger issue has been the hepatic problems. She has steatosis. She has NASH.  For now, as noted make any changes with her Coumadin.  She is being followed by gastroenterology.  I will continue to follow her Coumadin every month.  I will plan to see her back myself in 2 months.  We'll see her back, we will also be checking her iron and B12 levels.   [name], MD 9/23/20157:30 AM

## 2014-02-03 ENCOUNTER — Other Ambulatory Visit (HOSPITAL_BASED_OUTPATIENT_CLINIC_OR_DEPARTMENT_OTHER): Payer: Medicare Other | Admitting: Lab

## 2014-02-03 DIAGNOSIS — Z7901 Long term (current) use of anticoagulants: Secondary | ICD-10-CM

## 2014-02-03 DIAGNOSIS — I82403 Acute embolism and thrombosis of unspecified deep veins of lower extremity, bilateral: Secondary | ICD-10-CM

## 2014-02-03 DIAGNOSIS — D6861 Antiphospholipid syndrome: Secondary | ICD-10-CM

## 2014-02-03 DIAGNOSIS — D649 Anemia, unspecified: Secondary | ICD-10-CM

## 2014-02-03 LAB — PROTIME-INR (CHCC SATELLITE)
INR: 2.6 (ref 2.0–3.5)
Protime: 31.2 Seconds — ABNORMAL HIGH (ref 10.6–13.4)

## 2014-02-04 ENCOUNTER — Telehealth: Payer: Self-pay | Admitting: *Deleted

## 2014-02-04 NOTE — Telephone Encounter (Signed)
Called patient and left message. No change to coumadin dosing.

## 2014-02-04 NOTE — Telephone Encounter (Signed)
Message copied by Cordelia Poche on Wed Feb 04, 2014 10:34 AM ------      Message from: Burney Gauze R      Created: Wed Feb 04, 2014 10:24 AM       Call - no change in coumadin.  pete ------

## 2014-02-05 ENCOUNTER — Other Ambulatory Visit: Payer: Self-pay | Admitting: Hematology & Oncology

## 2014-03-05 ENCOUNTER — Other Ambulatory Visit: Payer: Self-pay | Admitting: Hematology & Oncology

## 2014-03-09 ENCOUNTER — Ambulatory Visit (HOSPITAL_BASED_OUTPATIENT_CLINIC_OR_DEPARTMENT_OTHER): Payer: Medicare Other | Admitting: Hematology & Oncology

## 2014-03-09 ENCOUNTER — Telehealth: Payer: Self-pay | Admitting: Hematology & Oncology

## 2014-03-09 ENCOUNTER — Other Ambulatory Visit: Payer: Self-pay | Admitting: *Deleted

## 2014-03-09 ENCOUNTER — Encounter: Payer: Self-pay | Admitting: Hematology & Oncology

## 2014-03-09 ENCOUNTER — Other Ambulatory Visit (HOSPITAL_BASED_OUTPATIENT_CLINIC_OR_DEPARTMENT_OTHER): Payer: Medicare Other | Admitting: Lab

## 2014-03-09 VITALS — BP 139/64 | HR 57 | Temp 97.6°F | Resp 14 | Ht 64.0 in | Wt 148.0 lb

## 2014-03-09 DIAGNOSIS — K7581 Nonalcoholic steatohepatitis (NASH): Secondary | ICD-10-CM

## 2014-03-09 DIAGNOSIS — I82403 Acute embolism and thrombosis of unspecified deep veins of lower extremity, bilateral: Secondary | ICD-10-CM

## 2014-03-09 DIAGNOSIS — K863 Pseudocyst of pancreas: Secondary | ICD-10-CM

## 2014-03-09 DIAGNOSIS — D6861 Antiphospholipid syndrome: Secondary | ICD-10-CM

## 2014-03-09 DIAGNOSIS — D649 Anemia, unspecified: Secondary | ICD-10-CM

## 2014-03-09 LAB — COMPREHENSIVE METABOLIC PANEL
ALT: 27 U/L (ref 0–35)
AST: 28 U/L (ref 0–37)
Albumin: 4 g/dL (ref 3.5–5.2)
Alkaline Phosphatase: 82 U/L (ref 39–117)
BUN: 18 mg/dL (ref 6–23)
CO2: 25 mEq/L (ref 19–32)
Calcium: 9.5 mg/dL (ref 8.4–10.5)
Chloride: 104 mEq/L (ref 96–112)
Creatinine, Ser: 0.74 mg/dL (ref 0.50–1.10)
Glucose, Bld: 182 mg/dL — ABNORMAL HIGH (ref 70–99)
Potassium: 4 mEq/L (ref 3.5–5.3)
Sodium: 140 mEq/L (ref 135–145)
Total Bilirubin: 1 mg/dL (ref 0.2–1.2)
Total Protein: 6.8 g/dL (ref 6.0–8.3)

## 2014-03-09 LAB — CBC WITH DIFFERENTIAL (CANCER CENTER ONLY)
BASO#: 0 10*3/uL (ref 0.0–0.2)
BASO%: 0.7 % (ref 0.0–2.0)
EOS%: 4.9 % (ref 0.0–7.0)
Eosinophils Absolute: 0.3 10*3/uL (ref 0.0–0.5)
HCT: 43.9 % (ref 34.8–46.6)
HGB: 15.6 g/dL (ref 11.6–15.9)
LYMPH#: 1.6 10*3/uL (ref 0.9–3.3)
LYMPH%: 27.1 % (ref 14.0–48.0)
MCH: 34.1 pg — ABNORMAL HIGH (ref 26.0–34.0)
MCHC: 35.5 g/dL (ref 32.0–36.0)
MCV: 96 fL (ref 81–101)
MONO#: 0.7 10*3/uL (ref 0.1–0.9)
MONO%: 12 % (ref 0.0–13.0)
NEUT#: 3.2 10*3/uL (ref 1.5–6.5)
NEUT%: 55.3 % (ref 39.6–80.0)
Platelets: 294 10*3/uL (ref 145–400)
RBC: 4.58 10*6/uL (ref 3.70–5.32)
RDW: 12.5 % (ref 11.1–15.7)
WBC: 5.8 10*3/uL (ref 3.9–10.0)

## 2014-03-09 LAB — FERRITIN CHCC: Ferritin: 80 ng/ml (ref 9–269)

## 2014-03-09 LAB — PROTIME-INR (CHCC SATELLITE)
INR: 3.5 (ref 2.0–3.5)
Protime: 42 Seconds — ABNORMAL HIGH (ref 10.6–13.4)

## 2014-03-09 LAB — IRON AND TIBC CHCC
%SAT: 30 % (ref 21–57)
Iron: 96 ug/dL (ref 41–142)
TIBC: 316 ug/dL (ref 236–444)
UIBC: 220 ug/dL (ref 120–384)

## 2014-03-09 LAB — VITAMIN B12: Vitamin B-12: 695 pg/mL (ref 211–911)

## 2014-03-09 MED ORDER — DIAZEPAM 5 MG PO TABS: 5.0000 mg | ORAL_TABLET | Freq: Once | ORAL | Status: DC

## 2014-03-09 NOTE — Telephone Encounter (Signed)
Pt aware of 12-14 lab and 1-4 MD. I gave her GI number to call and schedule MRI

## 2014-03-09 NOTE — Telephone Encounter (Signed)
Pt aware of appointments

## 2014-03-09 NOTE — Telephone Encounter (Signed)
GI will call pt to schedule MRI they have prescreen questions to ask and would not schedule with me with out answers to the questions. Pt wants premeds RN aware and will contact pt. Pt's wanted me to leave detailed message on voice mail, she is aware it is full.

## 2014-03-09 NOTE — Progress Notes (Signed)
Hematology and Oncology Follow Up Visit  Laurie Green [id number] [birth year] 68 y.o. 03/09/2014   Principle Diagnosis:  1. Antiphospholipid antibody syndrome. 2. History of recurrent lower extremity deep venous thrombosis. 3. Nonalcoholic steatohepatitis.  Current Therapy:   Coumadin to maintain an INR between 2-3.     Interim History:  Ms.  Green is back for followup. She's doing about the same. We will set her up with a MRI of her abdomen. She is worried about her pancreas. Her last MRI was done back in May. She had some thickening and some possible pseudocyst. We will follow-up with this.  She's doing well on the Coumadin. She's had no bleeding. He's had no blood clots in her leg. I will make sure that she takes 7.5 mg a day.  Her fibromyalgia is bother her quite a bit. She takes a natural remedy for this-oil of oregano. This seems to be helping.  She's had no cough. She's had no shortness of breath. She's had no nausea vomiting. She's had no change in bowel or bladder habits.  She does have fatigue. Again this is chronic.  [place] She is looking forward to this.  Medications: Current outpatient prescriptions: benazepril (LOTENSIN) 20 MG tablet, Take 20 mg by mouth daily.  , Disp: , Rfl: ;  COUMADIN 10 MG tablet, TAKE 1 TABLET BY MOUTH ONCE DAILY, Disp: 30 tablet, Rfl: 2;  COUMADIN 7.5 MG tablet, TAKE 1 TABLET BY MOUTH ONCE DAILY, Disp: 30 tablet, Rfl: 3;  DIGESTIVE ENZYMES PO, Take 1 capsule by mouth daily. Astaxanthin, Disp: , Rfl:  Ergocalciferol (VITAMIN D2) 2000 UNITS TABS, Take 1 tablet by mouth daily.  , Disp: , Rfl: ;  glucose 4 GM chewable tablet, Chew 16 g by mouth as needed for low blood sugar., Disp: , Rfl: ;  OIL OF OREGANO PO, Take 2 drops by mouth 2 (two) times daily., Disp: , Rfl: ;  omeprazole (PRILOSEC OTC) 20 MG tablet, Take 20 mg by mouth daily as needed (acid reflux). , Disp: , Rfl:    propranolol (INDERAL) 80 MG tablet, Take 80 mg by mouth daily.  , Disp: , Rfl: ;  warfarin (COUMADIN) 7.5 MG tablet, TAKES 7.5 MG ONE DAY AND 10 MG THE NEXT DAY AND REPEAT, Disp: , Rfl: ;  diazepam (VALIUM) 5 MG tablet, Take 1 tablet (5 mg total) by mouth once. May repeat times one, Disp: 2 tablet, Rfl: 0  Allergies:  Allergies  Allergen Reactions  . Caine-1 [Lidocaine Hcl] Anaphylaxis  . Morphine Anaphylaxis  . Zosyn [Piperacillin Sod-Tazobactam So] Itching    "scalp itch"-was given Benadryl to counteract.  . Codeine Nausea And Vomiting and Other (See Comments)  . Dilaudid [Hydromorphone Hcl] Nausea And Vomiting    Can take with zofran  . Morphine And Related Nausea And Vomiting  . Percocet [Oxycodone-Acetaminophen] Nausea And Vomiting  . Tramadol Nausea And Vomiting  . Vicodin [Hydrocodone-Acetaminophen] Nausea And Vomiting    Past Medical History, Surgical history, Social history, and Family History were reviewed and updated.  Review of Systems: As above  Physical Exam:  height is 5' 4"  (1.626 m) and weight is 148 lb (67.132 kg). Her oral temperature is 97.6 F (36.4 C). Her blood pressure is 139/64 and her pulse is 57. Her respiration is 14.   Well-developed and well-nourished white female in no obvious distress. Head and neck exam shows no ocular or oral lesions. There  are no palpable cervical or supraclavicular this. Lungs are clear bilateral. Cardiac exam regular in rhythm with no murmurs rubs or bruits. Abdomen soft. She has a slightly scar. She has no hepatomegaly. There is no fluid wave. There is no palpable liver. Back exam no tenderness over the spine ribs or hips. Extremities shows no clubbing cyanosis or edema. Skin exam no rashes.  Lab Results  Component Value Date   WBC 5.8 03/09/2014   HGB 15.6 03/09/2014   HCT 43.9 03/09/2014   MCV 96 03/09/2014   PLT 294 03/09/2014     Chemistry      Component Value Date/Time   NA 140 03/09/2014 0952   NA 142 01/06/2014  1013   K 4.0 03/09/2014 0952   K 3.7 01/06/2014 1013   CL 104 03/09/2014 0952   CL 99 01/06/2014 1013   CO2 25 03/09/2014 0952   CO2 28 01/06/2014 1013   BUN 18 03/09/2014 0952   BUN 16 01/06/2014 1013   CREATININE 0.74 03/09/2014 0952   CREATININE 0.7 01/06/2014 1013      Component Value Date/Time   CALCIUM 9.5 03/09/2014 0952   CALCIUM 9.2 01/06/2014 1013   ALKPHOS 82 03/09/2014 0952   ALKPHOS 71 01/06/2014 1013   AST 28 03/09/2014 0952   AST 28 01/06/2014 1013   ALT 27 03/09/2014 0952   ALT 28 01/06/2014 1013   BILITOT 1.0 03/09/2014 0952   BILITOT 1.50 01/06/2014 1013      INR is 3.5   Impression and Plan: Laurie Green is 68 year old white female with the anti-phospholipid antibody syndrome. This really has not been a problem for her. The bigger issue has been the hepatic problems. She has steatosis. She has NASH.  For now, we will change her Coumadin dose to 7.5 mg by mouth daily.  We will check her INR in 3 weeks.  We'll see her back in 6 weeks. [name], MD 11/23/20155:30 PM

## 2014-03-10 ENCOUNTER — Telehealth: Payer: Self-pay | Admitting: *Deleted

## 2014-03-10 NOTE — Telephone Encounter (Addendum)
Gave her results. She wants them mailed to her. Done  ----- Message from [name], MD sent at 03/09/2014  6:17 PM EST ----- Please call and let him know that the liver tests are absolutely normal. Laurie Green

## 2014-03-27 ENCOUNTER — Ambulatory Visit
Admission: RE | Admit: 2014-03-27 | Discharge: 2014-03-27 | Disposition: A | Discharge status: Home / Self Care | Payer: Medicare Other | Source: Ambulatory Visit | Attending: Hematology & Oncology | Admitting: Hematology & Oncology

## 2014-03-27 DIAGNOSIS — K863 Pseudocyst of pancreas: Secondary | ICD-10-CM

## 2014-03-27 MED ORDER — GADOBENATE DIMEGLUMINE 529 MG/ML IV SOLN
14.0000 mL | Freq: Once | INTRAVENOUS | Status: AC | PRN
  Administered 2014-03-27: 14 mL via INTRAVENOUS

## 2014-03-30 ENCOUNTER — Other Ambulatory Visit (HOSPITAL_BASED_OUTPATIENT_CLINIC_OR_DEPARTMENT_OTHER): Payer: Medicare Other | Admitting: Lab

## 2014-03-30 DIAGNOSIS — D649 Anemia, unspecified: Secondary | ICD-10-CM

## 2014-03-30 DIAGNOSIS — Z86718 Personal history of other venous thrombosis and embolism: Secondary | ICD-10-CM

## 2014-03-30 DIAGNOSIS — I82403 Acute embolism and thrombosis of unspecified deep veins of lower extremity, bilateral: Secondary | ICD-10-CM

## 2014-03-30 LAB — PROTIME-INR (CHCC SATELLITE)
INR: 1.9 — ABNORMAL LOW (ref 2.0–3.5)
Protime: 22.8 Seconds — ABNORMAL HIGH (ref 10.6–13.4)

## 2014-03-31 ENCOUNTER — Telehealth: Payer: Self-pay | Admitting: Nurse Practitioner

## 2014-03-31 NOTE — Telephone Encounter (Addendum)
-----   Message from [name], MD sent at 03/30/2014  6:18 PM EST ----- Call and let her know that the Coumadin level is okay. PETE  Pt verbalized understanding and appreciation.

## 2014-04-20 ENCOUNTER — Other Ambulatory Visit: Payer: Medicare Other | Admitting: Lab

## 2014-04-20 ENCOUNTER — Ambulatory Visit: Payer: Medicare Other | Admitting: Hematology & Oncology

## 2014-04-20 ENCOUNTER — Telehealth: Payer: Self-pay | Admitting: Hematology & Oncology

## 2014-04-20 NOTE — Telephone Encounter (Signed)
Pt moved 1-4 to 1-6

## 2014-04-22 ENCOUNTER — Other Ambulatory Visit (HOSPITAL_BASED_OUTPATIENT_CLINIC_OR_DEPARTMENT_OTHER): Payer: Medicare Other | Admitting: Lab

## 2014-04-22 ENCOUNTER — Ambulatory Visit (HOSPITAL_BASED_OUTPATIENT_CLINIC_OR_DEPARTMENT_OTHER): Payer: Medicare Other | Admitting: Family

## 2014-04-22 ENCOUNTER — Encounter: Payer: Self-pay | Admitting: Family

## 2014-04-22 VITALS — BP 143/68 | HR 61 | Temp 98.4°F | Resp 18 | Ht 64.0 in | Wt 149.0 lb

## 2014-04-22 DIAGNOSIS — K7581 Nonalcoholic steatohepatitis (NASH): Secondary | ICD-10-CM

## 2014-04-22 DIAGNOSIS — Z86718 Personal history of other venous thrombosis and embolism: Secondary | ICD-10-CM

## 2014-04-22 DIAGNOSIS — D6861 Antiphospholipid syndrome: Secondary | ICD-10-CM

## 2014-04-22 DIAGNOSIS — I82403 Acute embolism and thrombosis of unspecified deep veins of lower extremity, bilateral: Secondary | ICD-10-CM

## 2014-04-22 LAB — CMP (CANCER CENTER ONLY)
ALT(SGPT): 32 U/L (ref 10–47)
AST: 31 U/L (ref 11–38)
Albumin: 3.9 g/dL (ref 3.3–5.5)
Alkaline Phosphatase: 71 U/L (ref 26–84)
BUN, Bld: 17 mg/dL (ref 7–22)
CO2: 29 mEq/L (ref 18–33)
Calcium: 9.6 mg/dL (ref 8.0–10.3)
Chloride: 105 mEq/L (ref 98–108)
Creat: 1.1 mg/dl (ref 0.6–1.2)
Glucose, Bld: 125 mg/dL — ABNORMAL HIGH (ref 73–118)
Potassium: 3.8 mEq/L (ref 3.3–4.7)
Sodium: 143 mEq/L (ref 128–145)
Total Bilirubin: 1.4 mg/dl (ref 0.20–1.60)
Total Protein: 7.4 g/dL (ref 6.4–8.1)

## 2014-04-22 LAB — CBC WITH DIFFERENTIAL (CANCER CENTER ONLY)
BASO#: 0.1 10*3/uL (ref 0.0–0.2)
BASO%: 0.6 % (ref 0.0–2.0)
EOS%: 2.9 % (ref 0.0–7.0)
Eosinophils Absolute: 0.2 10*3/uL (ref 0.0–0.5)
HCT: 43.7 % (ref 34.8–46.6)
HGB: 15.4 g/dL (ref 11.6–15.9)
LYMPH#: 2.1 10*3/uL (ref 0.9–3.3)
LYMPH%: 25 % (ref 14.0–48.0)
MCH: 33.8 pg (ref 26.0–34.0)
MCHC: 35.2 g/dL (ref 32.0–36.0)
MCV: 96 fL (ref 81–101)
MONO#: 0.9 10*3/uL (ref 0.1–0.9)
MONO%: 10.8 % (ref 0.0–13.0)
NEUT#: 5.1 10*3/uL (ref 1.5–6.5)
NEUT%: 60.7 % (ref 39.6–80.0)
Platelets: 312 10*3/uL (ref 145–400)
RBC: 4.56 10*6/uL (ref 3.70–5.32)
RDW: 12.7 % (ref 11.1–15.7)
WBC: 8.4 10*3/uL (ref 3.9–10.0)

## 2014-04-22 LAB — PROTIME-INR (CHCC SATELLITE)
INR: 1.8 — ABNORMAL LOW (ref 2.0–3.5)
Protime: 21.6 Seconds — ABNORMAL HIGH (ref 10.6–13.4)

## 2014-04-22 NOTE — Progress Notes (Signed)
[place]  [phone] [phone]  ID: Laurie Green OB: 09-21-1945 MR#: [id number] Patient Care Team: Marjorie Smolder, MD as PCP - General (Family Medicine)  DIAGNOSIS: 1. Antiphospholipid antibody syndrome. 2. History of recurrent lower extremity deep venous thrombosis. 3. Nonalcoholic steatohepatitis.  INTERVAL HISTORY: Laurie Green is here today for a follow-up. She's doing better. She still has issues with her fibromyalgia and her back. She has an appointment with her chiropractor today after she leaves here.  She's doing well on Coumadin and has had no bleeding. She monitors her diet closely.  She denies fever, chills, n/v, cough, rash, headache, dizziness, SOB, chest pain, palpitations, abdominal pain, constipation, diarrhea, blood in urine or stool.  No swelling, tenderness, numbness or tingling in her extremities.  Her appetite is good and she is drinking plenty of fluids.   CURRENT TREATMENT: Coumadin to maintain an INR between 2-3.  REVIEW OF SYSTEMS: All other 10 point review of systems is negative.   PAST MEDICAL HISTORY: Past Medical History  Diagnosis Date  . TIA (transient ischemic attack)   . Hypertension   . Anti-cardiolipin antibody syndrome     antiphospholipid antibody syndrome  . Diverticulitis   . Portal hypertension   . Varices, gastric   . Varices, esophageal   . Fibromyalgia   . Celiac disease   . Diabetes mellitus   . PONV (postoperative nausea and vomiting)   . Heart murmur   . Blood dyscrasia     anticardiolipid syndrome  . Stroke     tia  . Blood transfusion     age 52 yr old-none since  . GERD (gastroesophageal reflux disease)   . Arthritis   . DVT (deep venous thrombosis) 05/01/2011  . Varices 05/01/2011  . NASH (nonalcoholic steatohepatitis) 05/01/2011  . Asthma     well controlled, no rescue inhaler used  . Antiphospholipid antibody with hypercoagulable state 11/07/2012    PAST  SURGICAL HISTORY: Past Surgical History  Procedure Laterality Date  . Abdominal hysterectomy    . Splenectomy, total    . Cholecystectomy    . Hernia repair      luq incisional  . Ankle fracture surgery Left     retained hardware  . Esophagogastroduodenoscopy  04/28/2011    Procedure: ESOPHAGOGASTRODUODENOSCOPY (EGD);  Surgeon: [name]., MD;  Location: [facility] ENDOSCOPY;  Service: Endoscopy;  Laterality: N/A;  . Colon surgery      hx. diverticulitis  . Eus N/A 09/25/2012    Procedure: ESOPHAGEAL ENDOSCOPIC ULTRASOUND (EUS) RADIAL;  Surgeon: Arta Silence, MD;  Location: [facility] ENDOSCOPY;  Service: Endoscopy;  Laterality: N/A;    FAMILY HISTORY History reviewed. No pertinent family history.  GYNECOLOGIC HISTORY:  No LMP recorded. Patient has had a hysterectomy.   SOCIAL HISTORY: History   Social History  . Marital Status: Married    Spouse Name: N/A    Number of Children: N/A  . Years of Education: N/A   Occupational History  . Not on file.   Social History Main Topics  . Smoking status: Never Smoker   . Smokeless tobacco: Never Used     Comment: never used tobacco  . Alcohol Use: No  . Drug Use: No  . Sexual Activity: Yes   Other Topics Concern  . Not on file   Social History Narrative    ADVANCED DIRECTIVES:  <no information>  HEALTH MAINTENANCE: History  Substance Use Topics  . Smoking status: Never Smoker   .  Smokeless tobacco: Never Used     Comment: never used tobacco  . Alcohol Use: No   Colonoscopy: PAP: Bone density: Lipid panel:  Allergies  Allergen Reactions  . Caine-1 [Lidocaine Hcl] Anaphylaxis  . Morphine Anaphylaxis  . Zosyn [Piperacillin Sod-Tazobactam So] Itching    "scalp itch"-was given Benadryl to counteract.  . Codeine Nausea And Vomiting and Other (See Comments)  . Dilaudid [Hydromorphone Hcl] Nausea And Vomiting    Can take with zofran  . Morphine And Related Nausea And Vomiting  . Percocet [Oxycodone-Acetaminophen]  Nausea And Vomiting  . Tramadol Nausea And Vomiting  . Vicodin [Hydrocodone-Acetaminophen] Nausea And Vomiting    Current Outpatient Prescriptions  Medication Sig Dispense Refill  . benazepril (LOTENSIN) 20 MG tablet Take 20 mg by mouth daily.      Marland Kitchen COUMADIN 10 MG tablet TAKE 1 TABLET BY MOUTH ONCE DAILY 30 tablet 2  . COUMADIN 7.5 MG tablet TAKE 1 TABLET BY MOUTH ONCE DAILY 30 tablet 3  . DIGESTIVE ENZYMES PO Take 1 capsule by mouth daily. Astaxanthin    . Ergocalciferol (VITAMIN D2) 2000 UNITS TABS Take 1 tablet by mouth daily.      Marland Kitchen glucose 4 GM chewable tablet Chew 16 g by mouth as needed for low blood sugar.    . OIL OF OREGANO PO Take 2 drops by mouth 2 (two) times daily.    Marland Kitchen omeprazole (PRILOSEC OTC) 20 MG tablet Take 20 mg by mouth daily as needed (acid reflux).     . propranolol (INDERAL) 80 MG tablet Take 80 mg by mouth daily.      Marland Kitchen warfarin (COUMADIN) 7.5 MG tablet TAKES 7.5 MG ONE DAY AND 10 MG THE NEXT DAY AND REPEAT     No current facility-administered medications for this visit.    OBJECTIVE: Filed Vitals:   04/22/14 1445  BP: 143/68  Pulse: 61  Temp: 98.4 F (36.9 C)  Resp: 18   Filed Weights   04/22/14 1445  Weight: 149 lb (67.586 kg)   ECOG FS:0 - Asymptomatic Ocular: Sclerae unicteric, pupils equal, round and reactive to light Ear-nose-throat: Oropharynx clear, dentition fair Lymphatic: No cervical or supraclavicular adenopathy Lungs no rales or rhonchi, good excursion bilaterally Heart regular rate and rhythm, no murmur appreciated Abd soft, nontender, positive bowel sounds MSK no focal spinal tenderness, no joint edema Neuro: non-focal, well-oriented, appropriate affect Breasts: Deferred  LAB RESULTS: CMP     Component Value Date/Time   NA 143 04/22/2014 1335   NA 140 03/09/2014 0952   K 3.8 04/22/2014 1335   K 4.0 03/09/2014 0952   CL 105 04/22/2014 1335   CL 104 03/09/2014 0952   CO2 29 04/22/2014 1335   CO2 25 03/09/2014 0952    GLUCOSE 125* 04/22/2014 1335   GLUCOSE 182* 03/09/2014 0952   BUN 17 04/22/2014 1335   BUN 18 03/09/2014 0952   CREATININE 1.1 04/22/2014 1335   CREATININE 0.74 03/09/2014 0952   CALCIUM 9.6 04/22/2014 1335   CALCIUM 9.5 03/09/2014 0952   PROT 7.4 04/22/2014 1335   PROT 6.8 03/09/2014 0952   ALBUMIN 4.0 03/09/2014 0952   AST 31 04/22/2014 1335   AST 28 03/09/2014 0952   ALT 32 04/22/2014 1335   ALT 27 03/09/2014 0952   ALKPHOS 71 04/22/2014 1335   ALKPHOS 82 03/09/2014 0952   BILITOT 1.40 04/22/2014 1335   BILITOT 1.0 03/09/2014 0952   GFRNONAA 58* 09/07/2012 0640   GFRAA 67* 09/07/2012 0640   INo  results found for: SPEP, UPEP Lab Results  Component Value Date   WBC 8.4 04/22/2014   NEUTROABS 5.1 04/22/2014   HGB 15.4 04/22/2014   HCT 43.7 04/22/2014   MCV 96 04/22/2014   PLT 312 04/22/2014   No results found for: LABCA2 No components found for: [id number]  Recent Labs Lab 04/22/14 1335  INR 1.8*   STUDIES: None  ASSESSMENT/PLAN: Laurie Green is 69 year old white female with anti-phospholipid antibody syndrome. She also has NASH which is her main issue. She is doing well and has had no issues with bleeding.  Her INR today is 1.8.  She will continue on her Coumadin at the same dose. We will continue to check her INR every 3 weeks.  We will see her back in 6 weeks for labs and follow-up.  She knows to call here with any questions or concerns and to go to the ED in the event of an emergency. We can certainly see her sooner if need be.   Eliezer Bottom, NP 04/22/2014 4:48 PM

## 2014-05-13 ENCOUNTER — Other Ambulatory Visit (HOSPITAL_BASED_OUTPATIENT_CLINIC_OR_DEPARTMENT_OTHER): Payer: Medicare Other | Admitting: Lab

## 2014-05-13 DIAGNOSIS — D6861 Antiphospholipid syndrome: Secondary | ICD-10-CM

## 2014-05-13 DIAGNOSIS — I82403 Acute embolism and thrombosis of unspecified deep veins of lower extremity, bilateral: Secondary | ICD-10-CM

## 2014-05-13 DIAGNOSIS — K7581 Nonalcoholic steatohepatitis (NASH): Secondary | ICD-10-CM

## 2014-05-13 LAB — PROTIME-INR (CHCC SATELLITE)
INR: 1.7 — ABNORMAL LOW (ref 2.0–3.5)
Protime: 20.4 Seconds — ABNORMAL HIGH (ref 10.6–13.4)

## 2014-06-03 ENCOUNTER — Ambulatory Visit (HOSPITAL_BASED_OUTPATIENT_CLINIC_OR_DEPARTMENT_OTHER): Payer: Medicare Other | Admitting: Hematology & Oncology

## 2014-06-03 ENCOUNTER — Other Ambulatory Visit (HOSPITAL_BASED_OUTPATIENT_CLINIC_OR_DEPARTMENT_OTHER): Payer: Medicare Other | Admitting: Lab

## 2014-06-03 ENCOUNTER — Encounter: Payer: Self-pay | Admitting: Hematology & Oncology

## 2014-06-03 VITALS — BP 132/58 | HR 61 | Temp 98.0°F | Resp 14 | Ht 64.0 in | Wt 149.0 lb

## 2014-06-03 DIAGNOSIS — D6861 Antiphospholipid syndrome: Secondary | ICD-10-CM

## 2014-06-03 DIAGNOSIS — K7581 Nonalcoholic steatohepatitis (NASH): Secondary | ICD-10-CM

## 2014-06-03 DIAGNOSIS — I82403 Acute embolism and thrombosis of unspecified deep veins of lower extremity, bilateral: Secondary | ICD-10-CM

## 2014-06-03 DIAGNOSIS — Z86718 Personal history of other venous thrombosis and embolism: Secondary | ICD-10-CM

## 2014-06-03 DIAGNOSIS — I82402 Acute embolism and thrombosis of unspecified deep veins of left lower extremity: Secondary | ICD-10-CM

## 2014-06-03 LAB — COMPREHENSIVE METABOLIC PANEL
ALT: 32 U/L (ref 0–35)
AST: 32 U/L (ref 0–37)
Albumin: 3.9 g/dL (ref 3.5–5.2)
Alkaline Phosphatase: 62 U/L (ref 39–117)
BUN: 19 mg/dL (ref 6–23)
CO2: 24 mEq/L (ref 19–32)
Calcium: 9.2 mg/dL (ref 8.4–10.5)
Chloride: 103 mEq/L (ref 96–112)
Creatinine, Ser: 0.66 mg/dL (ref 0.50–1.10)
Glucose, Bld: 160 mg/dL — ABNORMAL HIGH (ref 70–99)
Potassium: 4.1 mEq/L (ref 3.5–5.3)
Sodium: 138 mEq/L (ref 135–145)
Total Bilirubin: 1.4 mg/dL — ABNORMAL HIGH (ref 0.2–1.2)
Total Protein: 6.8 g/dL (ref 6.0–8.3)

## 2014-06-03 LAB — CBC WITH DIFFERENTIAL (CANCER CENTER ONLY)
BASO#: 0.1 10*3/uL (ref 0.0–0.2)
BASO%: 1 % (ref 0.0–2.0)
EOS%: 3.1 % (ref 0.0–7.0)
Eosinophils Absolute: 0.2 10*3/uL (ref 0.0–0.5)
HCT: 43.5 % (ref 34.8–46.6)
HGB: 15.2 g/dL (ref 11.6–15.9)
LYMPH#: 1.8 10*3/uL (ref 0.9–3.3)
LYMPH%: 31.4 % (ref 14.0–48.0)
MCH: 33.2 pg (ref 26.0–34.0)
MCHC: 34.9 g/dL (ref 32.0–36.0)
MCV: 95 fL (ref 81–101)
MONO#: 0.7 10*3/uL (ref 0.1–0.9)
MONO%: 12 % (ref 0.0–13.0)
NEUT#: 3.1 10*3/uL (ref 1.5–6.5)
NEUT%: 52.5 % (ref 39.6–80.0)
Platelets: 285 10*3/uL (ref 145–400)
RBC: 4.58 10*6/uL (ref 3.70–5.32)
RDW: 12.7 % (ref 11.1–15.7)
WBC: 5.8 10*3/uL (ref 3.9–10.0)

## 2014-06-03 LAB — PROTIME-INR (CHCC SATELLITE)
INR: 1.5 — ABNORMAL LOW (ref 2.0–3.5)
Protime: 18 Seconds — ABNORMAL HIGH (ref 10.6–13.4)

## 2014-06-03 MED ORDER — COUMADIN 10 MG PO TABS: 10.0000 mg | ORAL_TABLET | Freq: Every day | ORAL | Status: DC

## 2014-06-03 NOTE — Progress Notes (Signed)
Hematology and Oncology Follow Up Visit  Laurie Green [id number] [birth year] 69 y.o. 06/03/2014   Principle Diagnosis:  1. Antiphospholipid antibody syndrome. 2. History of recurrent lower extremity deep venous thrombosis. 3. Nonalcoholic steatohepatitis.  Current Therapy:   Coumadin to maintain an INR between 2-3.     Interim History:  Ms.  Green is back for followup. She's doing about the same. She still is having problems with her blood sugars. She does not want insulin. She does not want metformin. She has a hard time with metformin. She is on supplements.  She does have the NASH. This is about the same from my point of view.  She's never had problems with recurrent blood clots for the town lab known her. I don't think she has had any blood clot issues probably for over 10 years.  She is trying to watch her diet. She is is trying to exercise more. She is not having any fevers. There is no bleeding.  There is no problems with bowels or bladder. She has had no leg swelling or rashes.  She does have fibromyalgia. This does not seem to be doing all that badly right now.  Her performance status is ECOG 1.  Medications:  Current outpatient prescriptions:  .  benazepril (LOTENSIN) 20 MG tablet, Take 20 mg by mouth daily.  , Disp: , Rfl:  .  COUMADIN 7.5 MG tablet, TAKE 1 TABLET BY MOUTH ONCE DAILY, Disp: 30 tablet, Rfl: 3 .  DIGESTIVE ENZYMES PO, Take 1 capsule by mouth daily. Astaxanthin, Disp: , Rfl:  .  Ergocalciferol (VITAMIN D2) 2000 UNITS TABS, Take 1 tablet by mouth daily.  , Disp: , Rfl:  .  glucose 4 GM chewable tablet, Chew 16 g by mouth as needed for low blood sugar., Disp: , Rfl:  .  NON FORMULARY, Take by mouth 2 (two) times daily. GLYCEMIC BALENCE, Disp: , Rfl:  .  OIL OF OREGANO PO, Take 2 drops by mouth 2 (two) times daily., Disp: , Rfl:  .  omeprazole (PRILOSEC OTC) 20 MG tablet, Take 20 mg by mouth daily as needed (acid reflux). , Disp: , Rfl:  .   propranolol (INDERAL) 80 MG tablet, Take 80 mg by mouth daily.  , Disp: , Rfl:  .  COUMADIN 10 MG tablet, Take 1 tablet (10 mg total) by mouth daily., Disp: 30 tablet, Rfl: 2  Allergies:  Allergies  Allergen Reactions  . Caine-1 [Lidocaine Hcl] Anaphylaxis  . Morphine Anaphylaxis  . Zosyn [Piperacillin Sod-Tazobactam So] Itching    "scalp itch"-was given Benadryl to counteract.  . Codeine Nausea And Vomiting and Other (See Comments)  . Dilaudid [Hydromorphone Hcl] Nausea And Vomiting    Can take with zofran  . Morphine And Related Nausea And Vomiting  . Percocet [Oxycodone-Acetaminophen] Nausea And Vomiting  . Tramadol Nausea And Vomiting  . Vicodin [Hydrocodone-Acetaminophen] Nausea And Vomiting    Past Medical History, Surgical history, Social history, and Family History were reviewed and updated.  Review of Systems: As above  Physical Exam:  height is 5' 4"  (1.626 m) and weight is 149 lb (67.586 kg). Her oral temperature is 98 F (36.7 C). Her blood pressure is 132/58 and her pulse is 61. Her respiration is 14.   Well-developed and well-nourished white female in no obvious distress. Head and neck exam shows no ocular or oral lesions. There are no palpable cervical or supraclavicular this. Lungs are clear bilateral. Cardiac exam regular rate and rhythm with no murmurs  rubs or bruits. Abdomen is soft. 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 scar. She has no hepatomegaly. There is no fluid wave. Back exam shows no tenderness over spine ribs or hips. Extremities shows no clubbing cyanosis or edema. Skin exam no rashes, ecchymoses or petechia. Neurological exam is nonfocal. Lab Results  Component Value Date   WBC 5.8 06/03/2014   HGB 15.2 06/03/2014   HCT 43.5 06/03/2014   MCV 95 06/03/2014   PLT 285 06/03/2014     Chemistry      Component Value Date/Time   NA 138 06/03/2014 1228   NA 143 04/22/2014 1335   K 4.1 06/03/2014 1228   K 3.8 04/22/2014 1335    CL 103 06/03/2014 1228   CL 105 04/22/2014 1335   CO2 24 06/03/2014 1228   CO2 29 04/22/2014 1335   BUN 19 06/03/2014 1228   BUN 17 04/22/2014 1335   CREATININE 0.66 06/03/2014 1228   CREATININE 1.1 04/22/2014 1335      Component Value Date/Time   CALCIUM 9.2 06/03/2014 1228   CALCIUM 9.6 04/22/2014 1335   ALKPHOS 62 06/03/2014 1228   ALKPHOS 71 04/22/2014 1335   AST 32 06/03/2014 1228   AST 31 04/22/2014 1335   ALT 32 06/03/2014 1228   ALT 32 04/22/2014 1335   BILITOT 1.4* 06/03/2014 1228   BILITOT 1.40 04/22/2014 1335      INR is 1.5   Impression and Plan: Laurie Green is 69 year old white female with the anti-phospholipid antibody syndrome. This really has not been a problem for her. The bigger issue has been the hepatic problems. She has steatosis. She has NASH.  She is still very worried about her Coumadin not been therapeutic. She is taking supplements. It is possible that the supplements that she is taking might be affecting the Coumadin. I told her that no one would know for sure.  She has to go on Coumadin at 10 mg a day. We will check her INR in one week.  I will plan to see her back in about 2 months.  [name], MD 2/17/20166:00 PM

## 2014-06-09 ENCOUNTER — Other Ambulatory Visit: Payer: Medicare Other | Admitting: Lab

## 2014-06-11 ENCOUNTER — Other Ambulatory Visit (HOSPITAL_BASED_OUTPATIENT_CLINIC_OR_DEPARTMENT_OTHER): Payer: Medicare Other | Admitting: Lab

## 2014-06-11 DIAGNOSIS — D649 Anemia, unspecified: Secondary | ICD-10-CM

## 2014-06-11 DIAGNOSIS — Z86718 Personal history of other venous thrombosis and embolism: Secondary | ICD-10-CM

## 2014-06-11 DIAGNOSIS — I82403 Acute embolism and thrombosis of unspecified deep veins of lower extremity, bilateral: Secondary | ICD-10-CM

## 2014-06-11 DIAGNOSIS — Z7901 Long term (current) use of anticoagulants: Secondary | ICD-10-CM

## 2014-06-11 LAB — PROTIME-INR (CHCC SATELLITE)
INR: 2.8 (ref 2.0–3.5)
Protime: 33.6 Seconds — ABNORMAL HIGH (ref 10.6–13.4)

## 2014-06-12 ENCOUNTER — Telehealth: Payer: Self-pay | Admitting: *Deleted

## 2014-06-12 NOTE — Telephone Encounter (Signed)
-----   Message from [name], MD sent at 06/11/2014 12:37 PM EST ----- Call - INR is perfect!!  Make sure we repeat this in 2 weeks.  pete

## 2014-06-19 ENCOUNTER — Other Ambulatory Visit: Payer: Self-pay | Admitting: Neurological Surgery

## 2014-06-19 DIAGNOSIS — M48061 Spinal stenosis, lumbar region without neurogenic claudication: Secondary | ICD-10-CM

## 2014-06-19 DIAGNOSIS — M47812 Spondylosis without myelopathy or radiculopathy, cervical region: Secondary | ICD-10-CM

## 2014-06-24 ENCOUNTER — Other Ambulatory Visit (HOSPITAL_BASED_OUTPATIENT_CLINIC_OR_DEPARTMENT_OTHER): Payer: Medicare Other | Admitting: Lab

## 2014-06-24 DIAGNOSIS — D649 Anemia, unspecified: Secondary | ICD-10-CM

## 2014-06-24 DIAGNOSIS — I82403 Acute embolism and thrombosis of unspecified deep veins of lower extremity, bilateral: Secondary | ICD-10-CM

## 2014-06-24 LAB — PROTIME-INR (CHCC SATELLITE)
INR: 3.3 (ref 2.0–3.5)
Protime: 39.6 Seconds — ABNORMAL HIGH (ref 10.6–13.4)

## 2014-06-25 ENCOUNTER — Telehealth: Payer: Self-pay | Admitting: *Deleted

## 2014-06-25 ENCOUNTER — Other Ambulatory Visit: Payer: Self-pay | Admitting: *Deleted

## 2014-06-25 DIAGNOSIS — I82409 Acute embolism and thrombosis of unspecified deep veins of unspecified lower extremity: Secondary | ICD-10-CM

## 2014-06-25 NOTE — Telephone Encounter (Addendum)
Patient aware and appointment scheduled.   ----- Message from [name], MD sent at 06/24/2014  6:44 PM EST ----- Please call and tell her that  the INR is perfect. Do not change her Coumadin. Make sure her INRs checked in 2 weeks.

## 2014-07-07 IMAGING — CT CT ABD-PELV W/ CM
2 of 5 series · 16 of 46 positions shown, 18 images · IV contrast (APPLIED)
Comparison: 02/16/2011.

CLINICAL DATA: Right-sided abdominal pain, nausea and diarrhea.

CT ABDOMEN AND PELVIS WITH CONTRAST
TECHNIQUE: Multidetector CT imaging of the abdomen and pelvis was
performed following the standard protocol during bolus
administration of intravenous contrast.
Contrast: 50mL OMNIPAQUE IOHEXOL 300 MG/ML  SOLN, 100mL OMNIPAQUE
IOHEXOL 300 MG/ML  SOLN

[Series 2: abd/pelvis 5.0 b31f · axial · 0.67mm/px · z∈[+787,+1177]mm · 13 of 88 slices shown, 15 images]
[im 5/88  soft-tissue]
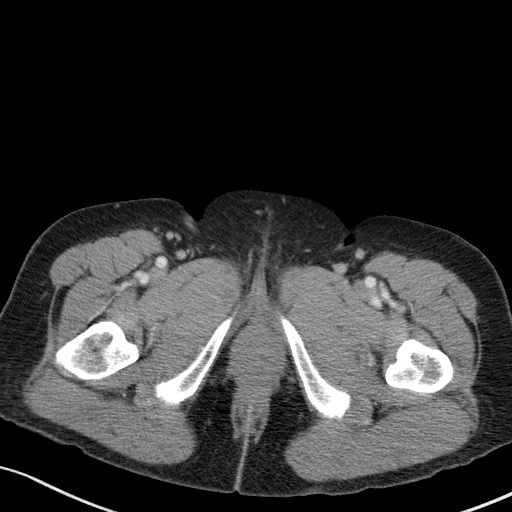
[im 5/88  bone]
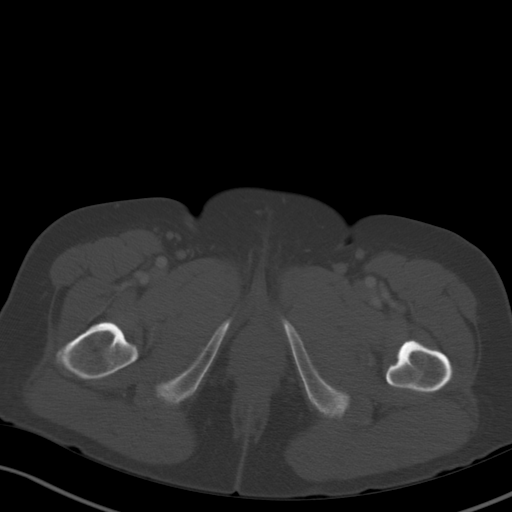
[im 10/88  soft-tissue]
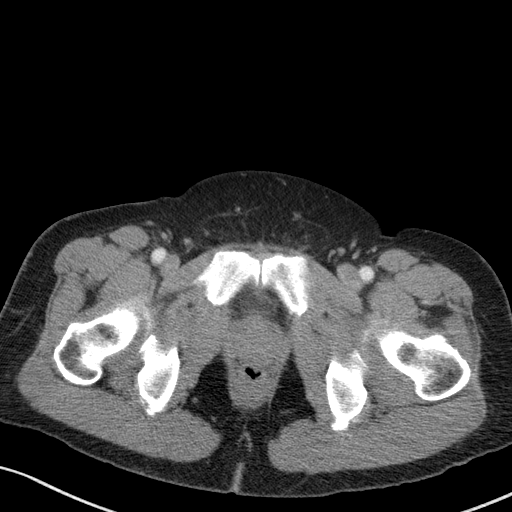
[im 20/88  soft-tissue]
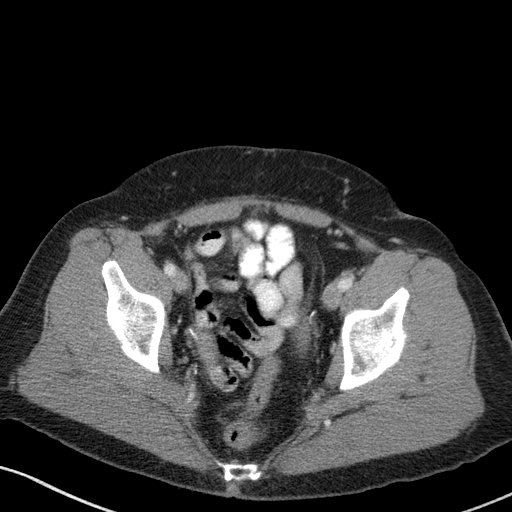
[im 25/88  soft-tissue]
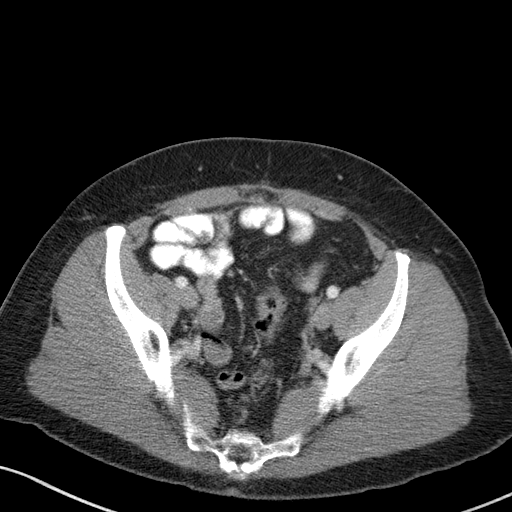
[im 30/88  soft-tissue]
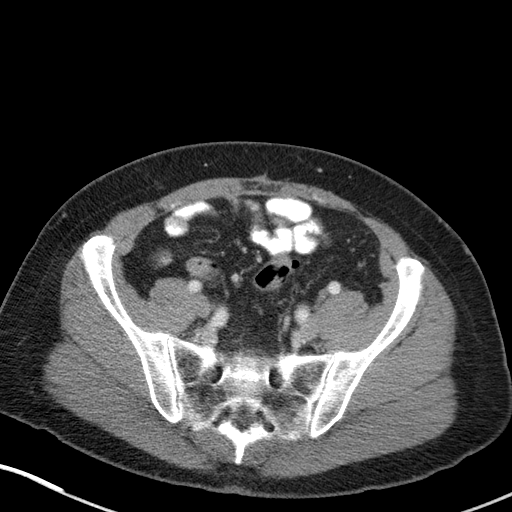
[im 39/88  soft-tissue]
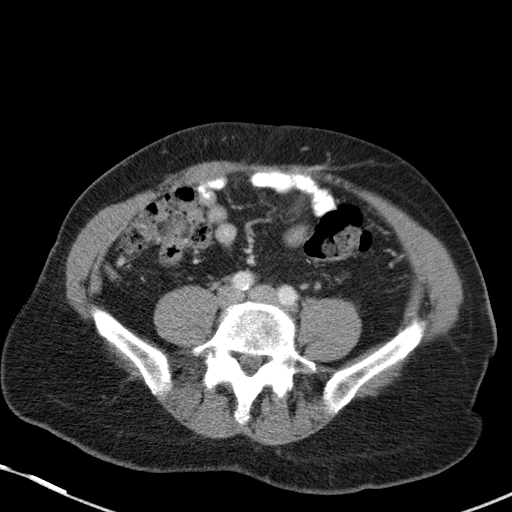
[im 44/88  soft-tissue]
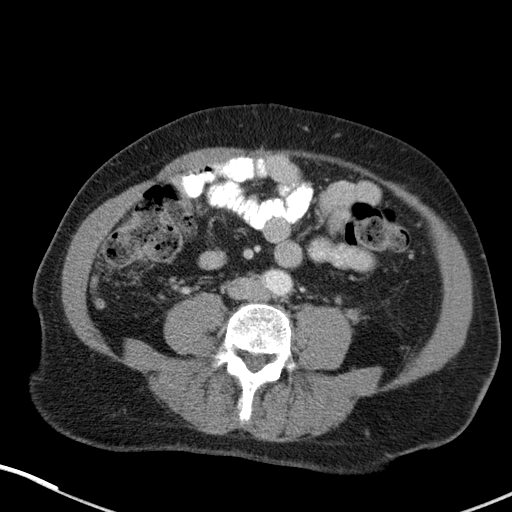
[im 49/88  soft-tissue]
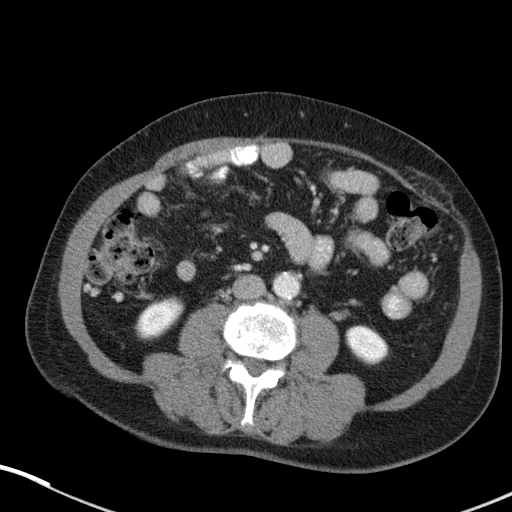
[im 59/88  soft-tissue]
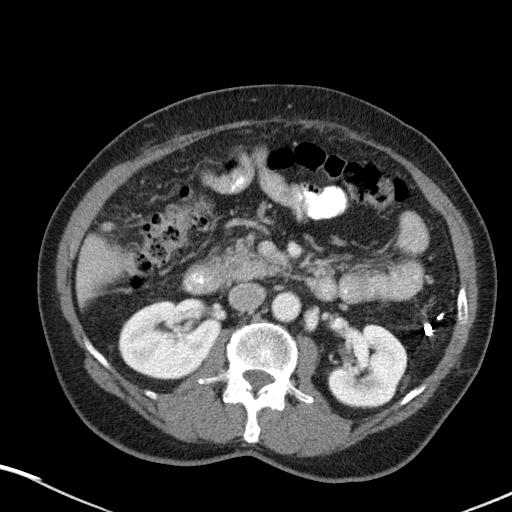
[im 59/88  bone]
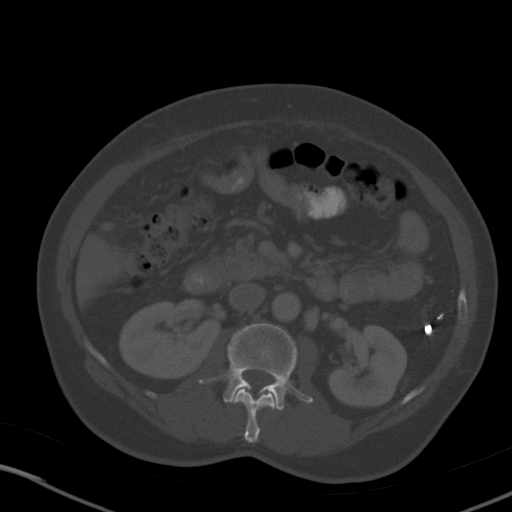
[im 63/88  soft-tissue]
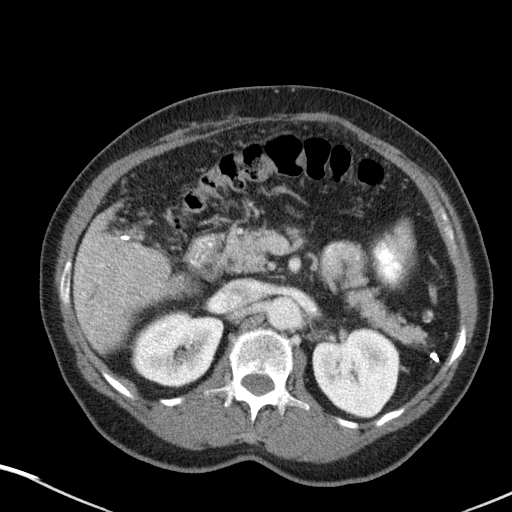
[im 68/88  soft-tissue]
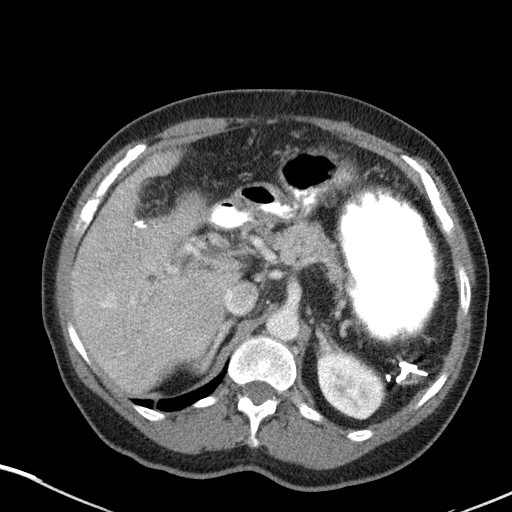
[im 78/88  soft-tissue]
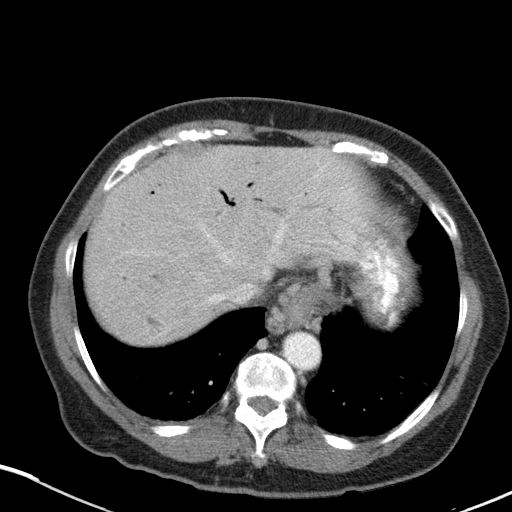
[im 83/88  soft-tissue]
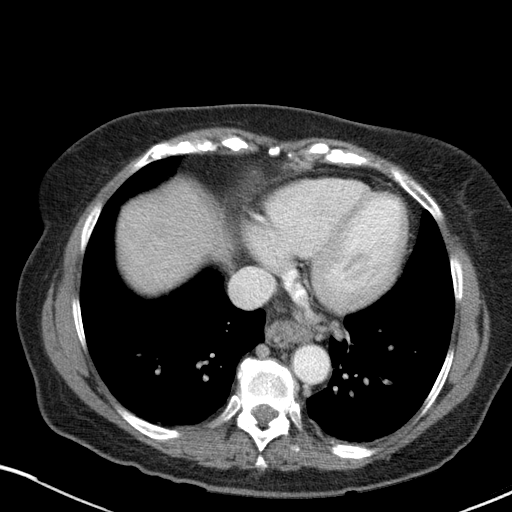

[Series 5: abd/pelvis 3.0 coronal · coronal · 0.76mm/px · 3 of 86 slices shown]
[im 29/86  soft-tissue]
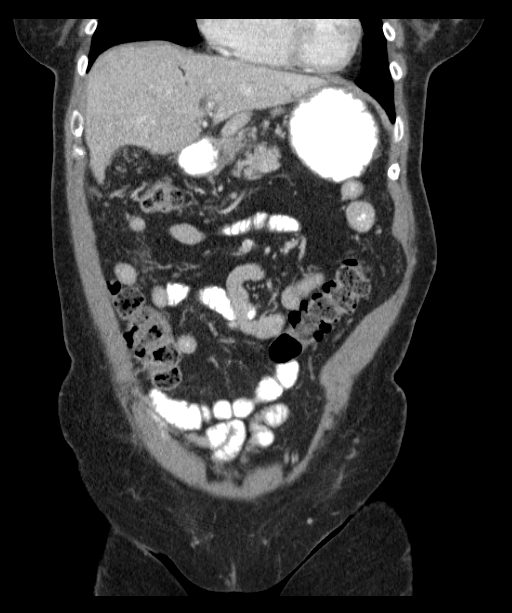
[im 38/86  soft-tissue]
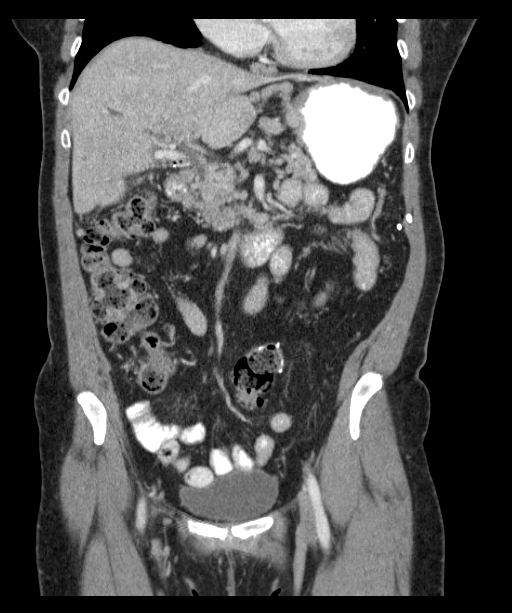
[im 48/86  soft-tissue]
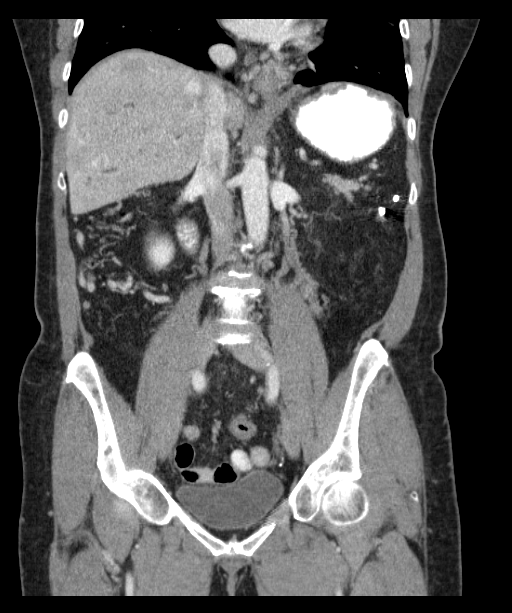

[16 of 46 positions shown; findings below may reference images not displayed]

FINDINGS: The lung bases are clear except for dependent bibasilar
atelectasis.  There are massive esophageal varices noted.

Stable cirrhotic changes involving the liver with cavernous
transformation of the portal vein and massive portal venous
collaterals.  The spleen is surgically absent.  Stable biliary air
likely due to prior sphincterotomy.  The gallbladder is surgically
absent.  The pancreas is unremarkable.  The adrenal glands and
kidneys are normal and stable.  No focal hepatic lesion.  Mild
stable biliary dilatation.  Stable scattered mesenteric and
retroperitoneal lymph nodes but no mass or adenopathy.

The stomach, duodenum, small bowel and colon are grossly normal.
The appendix is normal.  No inflammatory changes or mass lesions
involving the bowel.  The aorta is normal in caliber.  The major
branch vessels are patent.

Stable far left lateral anterior abdominal wall hernia containing
fat.

The uterus is surgically absent.  The bladder is normal.  No pelvic
mass or adenopathy.  No significant free pelvic fluid collections.
No inguinal mass or hernia.

The bony structures are intact.
IMPRESSION: 1.  Stable cirrhotic changes with cavernous transformation of the
portal vein and massive portal venous collaterals and esophageal
varices.
2.  Status post splenectomy and cholecystectomy.
3.  Stable mild biliary dilatation and biliary air.
4.  No acute abdominal/pelvic findings, mass lesions or
lymphadenopathy.

## 2014-07-08 ENCOUNTER — Other Ambulatory Visit (HOSPITAL_BASED_OUTPATIENT_CLINIC_OR_DEPARTMENT_OTHER): Payer: Medicare Other | Admitting: Lab

## 2014-07-08 DIAGNOSIS — I82402 Acute embolism and thrombosis of unspecified deep veins of left lower extremity: Secondary | ICD-10-CM

## 2014-07-08 DIAGNOSIS — Z7901 Long term (current) use of anticoagulants: Secondary | ICD-10-CM

## 2014-07-08 DIAGNOSIS — I82409 Acute embolism and thrombosis of unspecified deep veins of unspecified lower extremity: Secondary | ICD-10-CM

## 2014-07-08 LAB — PROTIME-INR (CHCC SATELLITE)
INR: 4 — ABNORMAL HIGH (ref 2.0–3.5)
Protime: 48 Seconds — ABNORMAL HIGH (ref 10.6–13.4)

## 2014-07-09 ENCOUNTER — Telehealth: Payer: Self-pay | Admitting: *Deleted

## 2014-07-09 ENCOUNTER — Other Ambulatory Visit: Payer: Medicare Other

## 2014-07-09 ENCOUNTER — Other Ambulatory Visit: Payer: Self-pay | Admitting: *Deleted

## 2014-07-09 DIAGNOSIS — I82409 Acute embolism and thrombosis of unspecified deep veins of unspecified lower extremity: Secondary | ICD-10-CM

## 2014-07-09 NOTE — Telephone Encounter (Addendum)
Spoke with patient, she is on Coumadin 47m/day. Notified Dr EMarin Olp Order to reduce coumadin to 7.516mdaily received and relayed to patient. Also scheduled patient for a PT/INR next Wednesday.   ----- Message from PeVolanda NapoleonMD sent at 07/08/2014  6:54 PM EDT ----- Call - INR is a little high.  How much coumadin is she taking??  pete

## 2014-07-15 ENCOUNTER — Other Ambulatory Visit (HOSPITAL_BASED_OUTPATIENT_CLINIC_OR_DEPARTMENT_OTHER): Payer: Medicare Other

## 2014-07-15 DIAGNOSIS — I82409 Acute embolism and thrombosis of unspecified deep veins of unspecified lower extremity: Secondary | ICD-10-CM | POA: Diagnosis not present

## 2014-07-15 LAB — PROTIME-INR (CHCC SATELLITE)
INR: 2.6 (ref 2.0–3.5)
Protime: 31.2 Seconds — ABNORMAL HIGH (ref 10.6–13.4)

## 2014-07-16 ENCOUNTER — Telehealth: Payer: Self-pay | Admitting: Nurse Practitioner

## 2014-07-16 NOTE — Telephone Encounter (Addendum)
Patient verbalized understanding and appreciation.   ----- Message from [name], MD sent at 07/15/2014  4:12 PM EDT ----- Call and let her know that the INR is perfect. No change in her Coumadin dose.

## 2014-07-20 ENCOUNTER — Encounter: Payer: Self-pay | Admitting: Nurse Practitioner

## 2014-07-20 ENCOUNTER — Telehealth: Payer: Self-pay | Admitting: Nurse Practitioner

## 2014-07-20 NOTE — Telephone Encounter (Signed)
Received a message from [facility]. Pt is approved for Coumadin from 07/16/2014 -07/16/2015. A letter of confirmation will be sent.

## 2014-07-20 NOTE — Progress Notes (Signed)
Received a message from [facility]. Pt is approved for Coumadin from 07/16/2014 -07/16/2015. A letter of confirmation will be sent.

## 2014-07-21 ENCOUNTER — Telehealth: Payer: Self-pay | Admitting: Hematology & Oncology

## 2014-07-21 NOTE — Telephone Encounter (Signed)
[employer] has APPROVED the COUMADIN  and is good until 07/16/2015.     P: [phone] HMO P: [phone] PPO   [id number]      COPY SCANNED

## 2014-08-03 ENCOUNTER — Ambulatory Visit
Admission: RE | Admit: 2014-08-03 | Discharge: 2014-08-03 | Disposition: A | Discharge status: Home / Self Care | Payer: Medicare Other | Source: Ambulatory Visit | Attending: Neurological Surgery | Admitting: Neurological Surgery

## 2014-08-03 ENCOUNTER — Telehealth: Payer: Self-pay | Admitting: Hematology & Oncology

## 2014-08-03 ENCOUNTER — Ambulatory Visit: Payer: Medicare Other | Admitting: Hematology & Oncology

## 2014-08-03 ENCOUNTER — Other Ambulatory Visit: Payer: Medicare Other

## 2014-08-03 DIAGNOSIS — M47812 Spondylosis without myelopathy or radiculopathy, cervical region: Secondary | ICD-10-CM

## 2014-08-03 DIAGNOSIS — M48061 Spinal stenosis, lumbar region without neurogenic claudication: Secondary | ICD-10-CM

## 2014-08-03 NOTE — Telephone Encounter (Signed)
Patient called and cx 08/03/14 apt.  Rn was notified of cx apt.  Patient resch for 08/26/14

## 2014-08-26 ENCOUNTER — Encounter: Payer: Self-pay | Admitting: Family

## 2014-08-26 ENCOUNTER — Ambulatory Visit (HOSPITAL_BASED_OUTPATIENT_CLINIC_OR_DEPARTMENT_OTHER): Payer: Medicare Other | Admitting: Family

## 2014-08-26 ENCOUNTER — Other Ambulatory Visit (HOSPITAL_BASED_OUTPATIENT_CLINIC_OR_DEPARTMENT_OTHER): Payer: Medicare Other

## 2014-08-26 DIAGNOSIS — I8500 Esophageal varices without bleeding: Secondary | ICD-10-CM | POA: Diagnosis not present

## 2014-08-26 DIAGNOSIS — K7581 Nonalcoholic steatohepatitis (NASH): Secondary | ICD-10-CM | POA: Diagnosis not present

## 2014-08-26 DIAGNOSIS — I82402 Acute embolism and thrombosis of unspecified deep veins of left lower extremity: Secondary | ICD-10-CM

## 2014-08-26 DIAGNOSIS — D6861 Antiphospholipid syndrome: Secondary | ICD-10-CM

## 2014-08-26 DIAGNOSIS — Z86718 Personal history of other venous thrombosis and embolism: Secondary | ICD-10-CM | POA: Diagnosis not present

## 2014-08-26 LAB — CBC WITH DIFFERENTIAL (CANCER CENTER ONLY)
BASO#: 0 10*3/uL (ref 0.0–0.2)
BASO%: 0.5 % (ref 0.0–2.0)
EOS%: 3.8 % (ref 0.0–7.0)
Eosinophils Absolute: 0.2 10*3/uL (ref 0.0–0.5)
HCT: 41.8 % (ref 34.8–46.6)
HGB: 14.8 g/dL (ref 11.6–15.9)
LYMPH#: 1.8 10*3/uL (ref 0.9–3.3)
LYMPH%: 30.1 % (ref 14.0–48.0)
MCH: 33.9 pg (ref 26.0–34.0)
MCHC: 35.4 g/dL (ref 32.0–36.0)
MCV: 96 fL (ref 81–101)
MONO#: 0.7 10*3/uL (ref 0.1–0.9)
MONO%: 11.5 % (ref 0.0–13.0)
NEUT#: 3.3 10*3/uL (ref 1.5–6.5)
NEUT%: 54.1 % (ref 39.6–80.0)
Platelets: 308 10*3/uL (ref 145–400)
RBC: 4.36 10*6/uL (ref 3.70–5.32)
RDW: 12.8 % (ref 11.1–15.7)
WBC: 6 10*3/uL (ref 3.9–10.0)

## 2014-08-26 LAB — CMP (CANCER CENTER ONLY)
ALT(SGPT): 28 U/L (ref 10–47)
AST: 30 U/L (ref 11–38)
Albumin: 3.7 g/dL (ref 3.3–5.5)
Alkaline Phosphatase: 69 U/L (ref 26–84)
BUN, Bld: 16 mg/dL (ref 7–22)
CO2: 32 mEq/L (ref 18–33)
Calcium: 9.5 mg/dL (ref 8.0–10.3)
Chloride: 101 mEq/L (ref 98–108)
Creat: 0.9 mg/dl (ref 0.6–1.2)
Glucose, Bld: 136 mg/dL — ABNORMAL HIGH (ref 73–118)
Potassium: 4 mEq/L (ref 3.3–4.7)
Sodium: 144 mEq/L (ref 128–145)
Total Bilirubin: 1.5 mg/dl (ref 0.20–1.60)
Total Protein: 7.1 g/dL (ref 6.4–8.1)

## 2014-08-26 LAB — PROTIME-INR (CHCC SATELLITE)
INR: 2.1 (ref 2.0–3.5)
Protime: 25.2 Seconds — ABNORMAL HIGH (ref 10.6–13.4)

## 2014-08-26 NOTE — Progress Notes (Signed)
Hematology and Oncology Follow Up Visit  Laurie Green [id number] 1945/10/10 69 y.o. 08/26/2014   Principle Diagnosis:  1. Antiphospholipid antibody syndrome  Current Therapy:   Coumadin 7.5 mg daily -  maintain an INR between 2-3.    Interim History:  Ms. Laurie Green is here today for a follow-up. 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 She is doing quite well on Coumadin. Her INR today is 2.1. She has had no issues with bruising or episodes of bleeding.  She also has NASH and esophageal varices secondary to that. She had her spleen removed in 2001.  She has IBS and bouts with colitis since she was a child. She has been having constipation for the last 2 months and some abdominal discomfort and gas. She plans to make an appointment with her GI Laurie Green regarding this. There has been no blood in her stool.  She denies fever, chills, n/v, cough, rash, dizziness, SOB, chest pain, palpitations, problems urinating, blood in urine or stool.  She has a good appetite and is staying hydrated. Her weight is stable.    Medications:    Medication List       This list is accurate as of: 08/26/14  5:33 PM.  Always use your most recent med list.               benazepril 20 MG tablet  Commonly known as:  LOTENSIN  Take 20 mg by mouth daily.     COUMADIN 7.5 MG tablet  Generic drug:  warfarin  TAKE 1 TABLET BY MOUTH ONCE DAILY     DIGESTIVE ENZYMES PO  Take 1 capsule by mouth daily. Astaxanthin     glucose 4 GM chewable tablet  Chew 16 g by mouth as needed for low blood sugar.     OIL OF OREGANO PO  Take 2 drops by mouth 2 (two) times daily.     omeprazole 20 MG tablet  Commonly known as:  PRILOSEC OTC  Take 20 mg by mouth daily as needed (acid reflux).     PREMARIN vaginal cream  Generic drug:  conjugated estrogens     propranolol 80 MG tablet  Commonly known as:  INDERAL  Take 80 mg by mouth daily.     Vitamin D2 2000 UNITS Tabs    Take 1 tablet by mouth daily.        Allergies:  Allergies  Allergen Reactions  . Caine-1 [Lidocaine Hcl] Anaphylaxis  . Morphine Anaphylaxis  . Zosyn [Piperacillin Sod-Tazobactam So] Itching    "scalp itch"-was given Benadryl to counteract.  . Codeine Nausea And Vomiting and Other (See Comments)  . Dilaudid [Hydromorphone Hcl] Nausea And Vomiting    Can take with zofran  . Morphine And Related Nausea And Vomiting  . Percocet [Oxycodone-Acetaminophen] Nausea And Vomiting  . Tramadol Nausea And Vomiting  . Vicodin [Hydrocodone-Acetaminophen] Nausea And Vomiting    Past Medical History, Surgical history, Social history, and Family History were reviewed and updated.  Review of Systems: All other 10 point review of systems is negative.   Physical Exam:  height is 5' 4"  (1.626 m) and weight is 147 lb (66.679 kg). Her oral temperature is 98 F (36.7 C). Her blood pressure is 147/79 and her pulse is 60. Her respiration is 16.   Wt Readings from Last 3 Encounters:  08/26/14 147 lb (66.679 kg)  06/03/14 149 lb (67.586 kg)  04/22/14 149 lb (67.586 kg)  Ocular: Sclerae unicteric, pupils equal, round and reactive to light Ear-nose-throat: Oropharynx clear, dentition fair Lymphatic: No cervical or supraclavicular adenopathy Lungs no rales or rhonchi, good excursion bilaterally Heart regular rate and rhythm, no murmur appreciated Abd soft, nontender, positive bowel sounds MSK no focal spinal tenderness, no joint edema Neuro: non-focal, well-oriented, appropriate affect Breasts: Deferred.   Lab Results  Component Value Date   WBC 6.0 08/26/2014   HGB 14.8 08/26/2014   HCT 41.8 08/26/2014   MCV 96 08/26/2014   PLT 308 08/26/2014   Lab Results  Component Value Date   FERRITIN 80 03/09/2014   IRON 96 03/09/2014   TIBC 316 03/09/2014   UIBC 220 03/09/2014   IRONPCTSAT 30 03/09/2014   Lab Results  Component Value Date   RBC 4.36 08/26/2014   No results found for:  KPAFRELGTCHN, LAMBDASER, KAPLAMBRATIO No results found for: IGGSERUM, IGA, IGMSERUM No results found for: Odetta Pink, SPEI   Chemistry      Component Value Date/Time   NA 144 08/26/2014 1408   NA 138 06/03/2014 1228   K 4.0 08/26/2014 1408   K 4.1 06/03/2014 1228   CL 101 08/26/2014 1408   CL 103 06/03/2014 1228   CO2 32 08/26/2014 1408   CO2 24 06/03/2014 1228   BUN 16 08/26/2014 1408   BUN 19 06/03/2014 1228   CREATININE 0.9 08/26/2014 1408   CREATININE 0.66 06/03/2014 1228      Component Value Date/Time   CALCIUM 9.5 08/26/2014 1408   CALCIUM 9.2 06/03/2014 1228   ALKPHOS 69 08/26/2014 1408   ALKPHOS 62 06/03/2014 1228   AST 30 08/26/2014 1408   AST 32 06/03/2014 1228   ALT 28 08/26/2014 1408   ALT 32 06/03/2014 1228   BILITOT 1.50 08/26/2014 1408   BILITOT 1.4* 06/03/2014 1228     Impression and Plan: Ms. Laurie Green is a very pleasant 69 year old white female with antiphospholipid antibody syndrome. She has not had a problem with blood clots in years. She is doing well on coumadin and has had no complications.  With her INR of 2.1 today we will leave her on her current dose of 7.5 mg daily.  She will continue to have her INR checked monthly and adjustments to her medication made accordingly.  We will see her back in 3 months for labs and follow-up. She will contact us with any questions or concerns. We can certainly see her sooner if need be.   Laurie Bottom, NP 5/11/20165:33 PM

## 2014-08-26 NOTE — Progress Notes (Signed)
Laurie Green had no swelling, tenderness, numbness or tingling in her extremities. She does have bulging discs in her neck. She states that her neurologist suggested steroid injections. She plans on giving this a try to help alleviate her discomfort.

## 2014-09-11 ENCOUNTER — Other Ambulatory Visit: Payer: Self-pay | Admitting: Hematology & Oncology

## 2014-09-23 ENCOUNTER — Other Ambulatory Visit (HOSPITAL_BASED_OUTPATIENT_CLINIC_OR_DEPARTMENT_OTHER): Payer: Medicare Other

## 2014-09-23 DIAGNOSIS — D649 Anemia, unspecified: Secondary | ICD-10-CM

## 2014-09-23 DIAGNOSIS — I82403 Acute embolism and thrombosis of unspecified deep veins of lower extremity, bilateral: Secondary | ICD-10-CM

## 2014-09-23 DIAGNOSIS — D6861 Antiphospholipid syndrome: Secondary | ICD-10-CM | POA: Diagnosis not present

## 2014-09-23 LAB — PROTIME-INR (CHCC SATELLITE)
INR: 2 (ref 2.0–3.5)
Protime: 24 Seconds — ABNORMAL HIGH (ref 10.6–13.4)

## 2014-09-24 ENCOUNTER — Telehealth: Payer: Self-pay | Admitting: *Deleted

## 2014-09-24 NOTE — Telephone Encounter (Addendum)
Patient is aware of results  ----- Message from [name], MD sent at 09/23/2014  5:44 PM EDT ----- Please call and let her know that the INR is perfect. Thanks

## 2014-10-28 ENCOUNTER — Telehealth: Payer: Self-pay | Admitting: Family

## 2014-10-28 ENCOUNTER — Ambulatory Visit: Payer: Medicare Other | Admitting: Family

## 2014-10-28 ENCOUNTER — Other Ambulatory Visit: Payer: Medicare Other

## 2014-10-28 NOTE — Telephone Encounter (Signed)
Pt called is passing Gallstones needed to reschedule

## 2014-11-03 ENCOUNTER — Encounter: Payer: Self-pay | Admitting: Family

## 2014-11-03 ENCOUNTER — Other Ambulatory Visit (HOSPITAL_BASED_OUTPATIENT_CLINIC_OR_DEPARTMENT_OTHER): Payer: Medicare Other

## 2014-11-03 ENCOUNTER — Ambulatory Visit (HOSPITAL_BASED_OUTPATIENT_CLINIC_OR_DEPARTMENT_OTHER): Payer: Medicare Other | Admitting: Family

## 2014-11-03 VITALS — BP 122/62 | HR 54 | Temp 97.9°F | Resp 16 | Wt 140.0 lb

## 2014-11-03 DIAGNOSIS — I82409 Acute embolism and thrombosis of unspecified deep veins of unspecified lower extremity: Secondary | ICD-10-CM

## 2014-11-03 DIAGNOSIS — D6861 Antiphospholipid syndrome: Secondary | ICD-10-CM | POA: Diagnosis not present

## 2014-11-03 LAB — CBC WITH DIFFERENTIAL (CANCER CENTER ONLY)
BASO#: 0.1 10*3/uL (ref 0.0–0.2)
BASO%: 1.1 % (ref 0.0–2.0)
EOS%: 3.2 % (ref 0.0–7.0)
Eosinophils Absolute: 0.2 10*3/uL (ref 0.0–0.5)
HCT: 43.5 % (ref 34.8–46.6)
HGB: 15.2 g/dL (ref 11.6–15.9)
LYMPH#: 1.8 10*3/uL (ref 0.9–3.3)
LYMPH%: 31.4 % (ref 14.0–48.0)
MCH: 33.6 pg (ref 26.0–34.0)
MCHC: 34.9 g/dL (ref 32.0–36.0)
MCV: 96 fL (ref 81–101)
MONO#: 0.7 10*3/uL (ref 0.1–0.9)
MONO%: 13.1 % — ABNORMAL HIGH (ref 0.0–13.0)
NEUT#: 2.9 10*3/uL (ref 1.5–6.5)
NEUT%: 51.2 % (ref 39.6–80.0)
Platelets: 270 10*3/uL (ref 145–400)
RBC: 4.52 10*6/uL (ref 3.70–5.32)
RDW: 13.1 % (ref 11.1–15.7)
WBC: 5.6 10*3/uL (ref 3.9–10.0)

## 2014-11-03 LAB — COMPREHENSIVE METABOLIC PANEL
ALT: 21 U/L (ref 0–35)
AST: 24 U/L (ref 0–37)
Albumin: 3.9 g/dL (ref 3.5–5.2)
Alkaline Phosphatase: 68 U/L (ref 39–117)
BUN: 15 mg/dL (ref 6–23)
CO2: 29 mEq/L (ref 19–32)
Calcium: 9.6 mg/dL (ref 8.4–10.5)
Chloride: 102 mEq/L (ref 96–112)
Creatinine, Ser: 0.72 mg/dL (ref 0.50–1.10)
Glucose, Bld: 179 mg/dL — ABNORMAL HIGH (ref 70–99)
Potassium: 4.3 mEq/L (ref 3.5–5.3)
Sodium: 141 mEq/L (ref 135–145)
Total Bilirubin: 1.4 mg/dL — ABNORMAL HIGH (ref 0.2–1.2)
Total Protein: 6.7 g/dL (ref 6.0–8.3)

## 2014-11-03 LAB — PROTIME-INR (CHCC SATELLITE)
INR: 2.1 (ref 2.0–3.5)
Protime: 25.2 Seconds — ABNORMAL HIGH (ref 10.6–13.4)

## 2014-11-03 NOTE — Progress Notes (Signed)
Hematology and Oncology Follow Up Visit  Laurie Green [id number] 08/23/1945 69 y.o. 11/03/2014   Principle Diagnosis:  1. Antiphospholipid antibody syndrome  Current Therapy:   Coumadin 7.5 mg daily -  maintain an INR between 2-3.    Interim History:  Laurie Green is here today for a follow-up. She is doing quite well and has no complaints at this time. Her INR today is 2.1. She has done well on Coumadin and has had no bruising or episodes of bleeding.  Of note, she also has NASH and esophageal varices secondary to that. She had her spleen removed in 2001.  She feels that her lower back issues are due to stone in her common bile duct. She is scheduled to have an endoscopy and colonoscopy later this month.  She does have bulging discs in her neck and has gotten steroid injections in the past. She is still having IBS. There has been no blood in her stool.  She denies fever, chills, n/v, cough, rash, dizziness, SOB, chest pain, palpitations, problems urinating and blood in urine.  No swelling, tenderness, numbness or tingling in her extremities.  She is staying hydrated and eating healthier. She is down 7 lbs since her last visit and is very excited about this. She is now controlling her blood sugar through diet and exercise. She is off diabetes medication.   Medications:    Medication List       This list is accurate as of: 11/03/14  1:47 PM.  Always use your most recent med list.               benazepril 20 MG tablet  Commonly known as:  LOTENSIN  Take 20 mg by mouth daily.     COUMADIN 7.5 MG tablet  Generic drug:  warfarin  TAKE 1 TABLET BY MOUTH ONCE DAILY     DIGESTIVE ENZYMES PO  Take 1 capsule by mouth daily. Astaxanthin     glucose 4 GM chewable tablet  Chew 16 g by mouth as needed for low blood sugar.     OIL OF OREGANO PO  Take 2 drops by mouth 2 (two) times daily.     omeprazole 20 MG tablet  Commonly known as:  PRILOSEC OTC  Take 20 mg by mouth daily as  needed (acid reflux).     PREMARIN vaginal cream  Generic drug:  conjugated estrogens     propranolol 80 MG tablet  Commonly known as:  INDERAL  Take 80 mg by mouth daily.     Vitamin D2 2000 UNITS Tabs  Take 1 tablet by mouth daily.        Allergies:  Allergies  Allergen Reactions  . Caine-1 [Lidocaine Hcl] Anaphylaxis  . Morphine Anaphylaxis  . Zosyn [Piperacillin Sod-Tazobactam So] Itching    "scalp itch"-was given Benadryl to counteract.  . Codeine Nausea And Vomiting and Other (See Comments)  . Dilaudid [Hydromorphone Hcl] Nausea And Vomiting    Can take with zofran  . Morphine And Related Nausea And Vomiting  . Percocet [Oxycodone-Acetaminophen] Nausea And Vomiting  . Tramadol Nausea And Vomiting  . Vicodin [Hydrocodone-Acetaminophen] Nausea And Vomiting    Past Medical History, Surgical history, Social history, and Family History were reviewed and updated.  Review of Systems: All other 10 point review of systems is negative.   Physical Exam:  weight is 140 lb (63.504 kg). Her oral temperature is 97.9 F (36.6 C). Her blood pressure is 122/62 and her pulse is 54. Her  respiration is 16.   Wt Readings from Last 3 Encounters:  11/03/14 140 lb (63.504 kg)  08/26/14 147 lb (66.679 kg)  06/03/14 149 lb (67.586 kg)    Ocular: Sclerae unicteric, pupils equal, round and reactive to light Ear-nose-throat: Oropharynx clear, dentition fair Lymphatic: No cervical or supraclavicular adenopathy Lungs no rales or rhonchi, good excursion bilaterally Heart regular rate and rhythm, no murmur appreciated Abd soft, nontender, positive bowel sounds MSK no focal spinal tenderness, no joint edema Neuro: non-focal, well-oriented, appropriate affect Breasts: Deferred.   Lab Results  Component Value Date   WBC 5.6 11/03/2014   HGB 15.2 11/03/2014   HCT 43.5 11/03/2014   MCV 96 11/03/2014   PLT 270 11/03/2014   Lab Results  Component Value Date   FERRITIN 80 03/09/2014    IRON 96 03/09/2014   TIBC 316 03/09/2014   UIBC 220 03/09/2014   IRONPCTSAT 30 03/09/2014   Lab Results  Component Value Date   RBC 4.52 11/03/2014   No results found for: KPAFRELGTCHN, LAMBDASER, KAPLAMBRATIO No results found for: IGGSERUM, IGA, IGMSERUM No results found for: Odetta Pink, SPEI   Chemistry      Component Value Date/Time   NA 144 08/26/2014 1408   NA 138 06/03/2014 1228   K 4.0 08/26/2014 1408   K 4.1 06/03/2014 1228   CL 101 08/26/2014 1408   CL 103 06/03/2014 1228   CO2 32 08/26/2014 1408   CO2 24 06/03/2014 1228   BUN 16 08/26/2014 1408   BUN 19 06/03/2014 1228   CREATININE 0.9 08/26/2014 1408   CREATININE 0.66 06/03/2014 1228      Component Value Date/Time   CALCIUM 9.5 08/26/2014 1408   CALCIUM 9.2 06/03/2014 1228   ALKPHOS 69 08/26/2014 1408   ALKPHOS 62 06/03/2014 1228   AST 30 08/26/2014 1408   AST 32 06/03/2014 1228   ALT 28 08/26/2014 1408   ALT 32 06/03/2014 1228   BILITOT 1.50 08/26/2014 1408   BILITOT 1.4* 06/03/2014 1228     Impression and Plan: Laurie Green is a very pleasant 69 year old white female with antiphospholipid antibody syndrome. She has not had a problem with blood clots in years. So far, she has had no problems while on Coumadin.  Her INR is 2.1 today so we will leave her on her current dose of 7.5 mg daily.  She will continue to have her INR checked monthly and adjustments to her medication made accordingly.  We will see her back in 2 months for labs and follow-up. She will contact us with any questions or concerns. We can certainly see her sooner if need be.   Eliezer Bottom, NP 7/19/20161:47 PM

## 2014-11-17 ENCOUNTER — Encounter (HOSPITAL_COMMUNITY): Payer: Self-pay | Admitting: *Deleted

## 2014-11-23 ENCOUNTER — Ambulatory Visit (HOSPITAL_COMMUNITY): Payer: Medicare Other | Admitting: Anesthesiology

## 2014-11-23 ENCOUNTER — Encounter (HOSPITAL_COMMUNITY)
Admission: RE | Disposition: A | Discharge status: Home / Self Care | Payer: Self-pay | Source: Ambulatory Visit | Attending: Gastroenterology

## 2014-11-23 ENCOUNTER — Ambulatory Visit (HOSPITAL_COMMUNITY)
Admission: RE | Admit: 2014-11-23 | Discharge: 2014-11-23 | Disposition: A | Discharge status: Home / Self Care | Payer: Medicare Other | Source: Ambulatory Visit | Attending: Gastroenterology | Admitting: Gastroenterology

## 2014-11-23 ENCOUNTER — Encounter (HOSPITAL_COMMUNITY): Payer: Self-pay | Admitting: Gastroenterology

## 2014-11-23 ENCOUNTER — Other Ambulatory Visit: Payer: Self-pay | Admitting: Gastroenterology

## 2014-11-23 DIAGNOSIS — K7581 Nonalcoholic steatohepatitis (NASH): Secondary | ICD-10-CM | POA: Diagnosis not present

## 2014-11-23 DIAGNOSIS — K766 Portal hypertension: Secondary | ICD-10-CM | POA: Diagnosis not present

## 2014-11-23 DIAGNOSIS — J45909 Unspecified asthma, uncomplicated: Secondary | ICD-10-CM | POA: Insufficient documentation

## 2014-11-23 DIAGNOSIS — Z1211 Encounter for screening for malignant neoplasm of colon: Secondary | ICD-10-CM | POA: Insufficient documentation

## 2014-11-23 DIAGNOSIS — Z8673 Personal history of transient ischemic attack (TIA), and cerebral infarction without residual deficits: Secondary | ICD-10-CM | POA: Insufficient documentation

## 2014-11-23 DIAGNOSIS — I81 Portal vein thrombosis: Secondary | ICD-10-CM | POA: Diagnosis not present

## 2014-11-23 DIAGNOSIS — I10 Essential (primary) hypertension: Secondary | ICD-10-CM | POA: Insufficient documentation

## 2014-11-23 DIAGNOSIS — K219 Gastro-esophageal reflux disease without esophagitis: Secondary | ICD-10-CM | POA: Diagnosis not present

## 2014-11-23 DIAGNOSIS — I8510 Secondary esophageal varices without bleeding: Secondary | ICD-10-CM | POA: Diagnosis not present

## 2014-11-23 DIAGNOSIS — M797 Fibromyalgia: Secondary | ICD-10-CM | POA: Diagnosis not present

## 2014-11-23 DIAGNOSIS — K900 Celiac disease: Secondary | ICD-10-CM | POA: Diagnosis not present

## 2014-11-23 DIAGNOSIS — D6861 Antiphospholipid syndrome: Secondary | ICD-10-CM | POA: Insufficient documentation

## 2014-11-23 DIAGNOSIS — Z7901 Long term (current) use of anticoagulants: Secondary | ICD-10-CM | POA: Diagnosis not present

## 2014-11-23 DIAGNOSIS — Z79899 Other long term (current) drug therapy: Secondary | ICD-10-CM | POA: Diagnosis not present

## 2014-11-23 DIAGNOSIS — Z86718 Personal history of other venous thrombosis and embolism: Secondary | ICD-10-CM | POA: Insufficient documentation

## 2014-11-23 DIAGNOSIS — K3189 Other diseases of stomach and duodenum: Secondary | ICD-10-CM | POA: Insufficient documentation

## 2014-11-23 DIAGNOSIS — E119 Type 2 diabetes mellitus without complications: Secondary | ICD-10-CM | POA: Insufficient documentation

## 2014-11-23 HISTORY — DX: Nausea with vomiting, unspecified: R11.2

## 2014-11-23 HISTORY — PX: ESOPHAGOGASTRODUODENOSCOPY: SHX5428

## 2014-11-23 HISTORY — PX: COLONOSCOPY: SHX5424

## 2014-11-23 LAB — GLUCOSE, CAPILLARY
Glucose-Capillary: 128 mg/dL — ABNORMAL HIGH (ref 65–99)
Glucose-Capillary: 138 mg/dL — ABNORMAL HIGH (ref 65–99)

## 2014-11-23 SURGERY — EGD (ESOPHAGOGASTRODUODENOSCOPY)
Anesthesia: Monitor Anesthesia Care

## 2014-11-23 MED ORDER — PROPOFOL 10 MG/ML IV BOLUS
INTRAVENOUS | Status: AC
  Filled 2014-11-23: qty 20

## 2014-11-23 MED ORDER — LACTATED RINGERS IV SOLN
INTRAVENOUS | Status: DC | PRN
  Administered 2014-11-23: 09:00:00 via INTRAVENOUS

## 2014-11-23 MED ORDER — PROPOFOL 10 MG/ML IV BOLUS
INTRAVENOUS | Status: DC | PRN
  Administered 2014-11-23 (×11): 20 mg via INTRAVENOUS
  Administered 2014-11-23: 10 mg via INTRAVENOUS
  Administered 2014-11-23 (×2): 20 mg via INTRAVENOUS
  Administered 2014-11-23: 30 mg via INTRAVENOUS
  Administered 2014-11-23 (×8): 20 mg via INTRAVENOUS

## 2014-11-23 MED ORDER — ONDANSETRON HCL 4 MG/2ML IJ SOLN
INTRAMUSCULAR | Status: DC | PRN
  Administered 2014-11-23: 4 mg via INTRAVENOUS

## 2014-11-23 NOTE — Anesthesia Postprocedure Evaluation (Signed)
  Anesthesia Post-op Note  Patient: Laurie Green  Procedure(s) Performed: Procedure(s) (LRB): ESOPHAGOGASTRODUODENOSCOPY (EGD) (N/A) COLONOSCOPY (N/A)  Patient Location: PACU  Anesthesia Type: MAC  Level of Consciousness: awake and alert   Airway and Oxygen Therapy: Patient Spontanous Breathing  Post-op Pain: mild  Post-op Assessment: Post-op Vital signs reviewed, Patient's Cardiovascular Status Stable, Respiratory Function Stable, Patent Airway and No signs of Nausea or vomiting  Last Vitals:  Filed Vitals:   11/23/14 1002  BP:   Pulse: 84  Temp:   Resp: 16    Post-op Vital Signs: stable   Complications: No apparent anesthesia complications

## 2014-11-23 NOTE — Discharge Instructions (Addendum)
Resume coumadin at regular dose tomorrow. Will call with the path report. Esophagogastroduodenoscopy Care After Refer to this sheet in the next few weeks. These instructions provide you with information on caring for yourself after your procedure. Your caregiver may also give you more specific instructions. Your treatment has been planned according to current medical practices, but problems sometimes occur. Call your caregiver if you have any problems or questions after your procedure.  HOME CARE INSTRUCTIONS  Do not eat or drink anything until the numbing medicine (local anesthetic) has worn off and your gag reflex has returned. You will know that the local anesthetic has worn off when you can swallow comfortably.  Do not drive for 24 hours after the procedure or as directed by your caregiver.  Only take medicines as directed by your caregiver. SEEK MEDICAL CARE IF:   You cannot stop coughing.  You are not urinating at all or less than usual. SEEK IMMEDIATE MEDICAL CARE IF:  You have difficulty swallowing.  You cannot eat or drink.  You have worsening throat or chest pain.  You have dizziness, lightheadedness, or you faint.  You have nausea or vomiting.  You have chills.  You have a fever.  You have severe abdominal pain.  You have black, tarry, or bloody stools. Document Released: 03/20/2012 Document Reviewed: 03/20/2012 [facility] Patient Information 2015 [place]. This information is not intended to replace advice given to you by your health care provider. Make sure you discuss any questions you have with your health care provider. Colonoscopy, Care After These instructions give you information on caring for yourself after your procedure. Your doctor may also give you more specific instructions. Call your doctor if you have any problems or questions after your procedure. HOME CARE  Do not drive for 24 hours.  Do not sign important papers or use machinery for 24  hours.  You may shower.  You may go back to your usual activities, but go slower for the first 24 hours.  Take rest breaks often during the first 24 hours.  Walk around or use warm packs on your belly (abdomen) if you have belly cramping or gas.  Drink enough fluids to keep your pee (urine) clear or pale yellow.  Resume your normal diet. Avoid heavy or fried foods.  Avoid drinking alcohol for 24 hours or as told by your doctor.  Only take medicines as told by your doctor. If a tissue sample (biopsy) was taken during the procedure:   Do not take aspirin or blood thinners for 7 days, or as told by your doctor.  Do not drink alcohol for 7 days, or as told by your doctor.  Eat soft foods for the first 24 hours. GET HELP IF: You still have a small amount of blood in your poop (stool) 2-3 days after the procedure. GET HELP RIGHT AWAY IF:  You have more than a small amount of blood in your poop.  You see clumps of tissue (blood clots) in your poop.  Your belly is puffy (swollen).  You feel sick to your stomach (nauseous) or throw up (vomit).  You have a fever.  You have belly pain that gets worse and medicine does not help. MAKE SURE YOU:  Understand these instructions.  Will watch your condition.  Will get help right away if you are not doing well or get worse. Document Released: 05/06/2010 Document Revised: 04/08/2013 Document Reviewed: 12/09/2012 [facility] Patient Information 2015 [place], [place]. This information is not intended to replace  advice given to you by your health care provider. Make sure you discuss any questions you have with your health care provider. No MRI for 30 days without abdominal x ray

## 2014-11-23 NOTE — Transfer of Care (Signed)
Immediate Anesthesia Transfer of Care Note  Patient: Laurie Green  Procedure(s) Performed: Procedure(s): ESOPHAGOGASTRODUODENOSCOPY (EGD) (N/A) COLONOSCOPY (N/A)  Patient Location: PACU  Anesthesia Type:MAC  Level of Consciousness: sedated  Airway & Oxygen Therapy: Patient Spontanous Breathing and Patient connected to nasal cannula oxygen  Post-op Assessment: Report given to RN and Post -op Vital signs reviewed and stable  Post vital signs: Reviewed and stable  Last Vitals:  Filed Vitals:   11/23/14 0844  BP: 159/68  Temp: 36.7 C  Resp: 23    Complications: No apparent anesthesia complications

## 2014-11-23 NOTE — Anesthesia Preprocedure Evaluation (Signed)
Anesthesia Evaluation  Patient identified by MRN, date of birth, ID band Patient awake  General Assessment Comment:.  TIA (transient ischemic attack)     .  Hypertension     .  Anti-cardiolipin antibody syndrome         antiphospholipid antibody syndrome   .  Diverticulitis     .  Portal hypertension     .  Varices, gastric     .  Varices, esophageal     .  Fibromyalgia     .  Celiac disease     .  Diabetes mellitus     .  PONV (postoperative nausea and vomiting)     .  Heart murmur     .  Asthma     .  Blood dyscrasia         anticardiolipid syndrome   .  Stroke         tia   .  Blood transfusion     .  GERD (gastroesophageal reflux disease)     .  Arthritis     .  DVT (deep venous thrombosis)  05/01/2011   .  Varices  05/01/2011   .  NASH (nonalcoholic steatohepatitis)  05/01/2011     Reviewed: Allergy & Precautions, H&P , NPO status , Patient's Chart, lab work & pertinent test results  History of Anesthesia Complications (+) PONV and history of anesthetic complications  Airway Mallampati: II  TM Distance: >3 FB Neck ROM: Full    Dental no notable dental hx.    Pulmonary asthma ,  breath sounds clear to auscultation  Pulmonary exam normal       Cardiovascular Exercise Tolerance: Good hypertension, Pt. on medications and Pt. on home beta blockers Normal cardiovascular examRhythm:Regular Rate:Normal  ECG: normal.   Neuro/Psych TIA Neuromuscular disease CVA negative psych ROS   GI/Hepatic GERD-  Medicated,(+) Hepatitis -, Unspecified  Endo/Other  negative endocrine ROSdiabetes, Type 2, Oral Hypoglycemic Agents  Renal/GU negative Renal ROS  negative genitourinary   Musculoskeletal  (+) Fibromyalgia -  Abdominal   Peds negative pediatric ROS (+)  Hematology  (+) Blood dyscrasia, ,   Anesthesia Other Findings INR 2 on 11/03/14, on coumadin for antiphospholipid syndrome  Reproductive/Obstetrics negative OB  ROS                             Anesthesia Physical  Anesthesia Plan  ASA: III  Anesthesia Plan: MAC   Post-op Pain Management:    Induction: Intravenous  Airway Management Planned: Natural Airway  Additional Equipment:   Intra-op Plan:   Post-operative Plan: Extubation in OR  Informed Consent: I have reviewed the patients History and Physical, chart, labs and discussed the procedure including the risks, benefits and alternatives for the proposed anesthesia with the patient or authorized representative who has indicated his/her understanding and acceptance.   Dental advisory given  Plan Discussed with: CRNA  Anesthesia Plan Comments: (Prop mac)        Anesthesia Quick Evaluation

## 2014-11-23 NOTE — H&P (Signed)
Subjective:   Patient is a 69 y.o. female presents with need for screening colonoscopy and follow-up with esophageal varices. She has antiphospholipid syndrome and secondary portal hypertension to the portal vein thrombosis improbable cirrhosis. She is chronically on Coumadin but has not had any now for 5 days. We have been able to hold her Coumadin in the past without any problems. She has never had variceal bleeding . Procedure including risks and benefits discussed in office.  Patient Active Problem List   Diagnosis Date Noted  . Antiphospholipid antibody with hypercoagulable state 11/07/2012  . Transaminitis 09/07/2012  . Fever, unspecified 09/07/2012  . Abdominal pain 09/04/2012  . Subtherapeutic international normalized ratio (INR) 09/04/2012  . Diabetes mellitus 09/04/2012  . Leukocytosis, unspecified 09/04/2012  . Varices, esophageal 09/04/2012  . Transaminasemia 09/04/2012  . DVT (deep venous thrombosis) 05/01/2011  . Varices 05/01/2011  . NASH (nonalcoholic steatohepatitis) 05/01/2011  . Incisional hernia without mention of obstruction or gangrene 02/14/2011   Past Medical History  Diagnosis Date  . TIA (transient ischemic attack)   . Hypertension   . Anti-cardiolipin antibody syndrome     antiphospholipid antibody syndrome  . Diverticulitis   . Portal hypertension   . Varices, gastric   . Varices, esophageal   . Fibromyalgia   . Celiac disease   . Diabetes mellitus   . PONV (postoperative nausea and vomiting)   . Heart murmur   . Blood dyscrasia     anticardiolipid syndrome  . Stroke     tia  . Blood transfusion     age 25 yr old-none since  . GERD (gastroesophageal reflux disease)   . Arthritis   . DVT (deep venous thrombosis) 05/01/2011  . Varices 05/01/2011  . NASH (nonalcoholic steatohepatitis) 05/01/2011  . Asthma     well controlled, no rescue inhaler used  . Antiphospholipid antibody with hypercoagulable state 11/07/2012  . Nausea & vomiting    11-17-14 "nausea and vomiting after meals" at present discussed with Dr. Oletta Lamas office per pt.    Past Surgical History  Procedure Laterality Date  . Abdominal hysterectomy    . Splenectomy, total    . Cholecystectomy    . Hernia repair      luq incisional  . Ankle fracture surgery Left     retained hardware  . Esophagogastroduodenoscopy  04/28/2011    Procedure: ESOPHAGOGASTRODUODENOSCOPY (EGD);  Surgeon: [name]., MD;  Location: [facility] ENDOSCOPY;  Service: Endoscopy;  Laterality: N/A;  . Colon surgery      hx. diverticulitis  . Eus N/A 09/25/2012    Procedure: ESOPHAGEAL ENDOSCOPIC ULTRASOUND (EUS) RADIAL;  Surgeon: Arta Silence, MD;  Location: [facility] ENDOSCOPY;  Service: Endoscopy;  Laterality: N/A;  . Tonsillectomy      Prescriptions prior to admission  Medication Sig Dispense Refill Last Dose  . benazepril (LOTENSIN) 20 MG tablet Take 20 mg by mouth every morning.    Past Week at Unknown time  . Cholecalciferol (VITAMIN D3) 2000 UNITS capsule Take 2,000 Units by mouth daily.   Past Week at Unknown time  . CINNAMON PO Take 2 tablets by mouth daily.   Past Week at Unknown time  . COUMADIN 7.5 MG tablet TAKE 1 TABLET BY MOUTH ONCE DAILY 30 tablet 3 11/18/2014 at 9pm  . Ergocalciferol (VITAMIN D2) 2000 UNITS TABS Take 1 tablet by mouth daily.     Past Month at Unknown time  . glucose 4 GM chewable tablet Chew 16 g by mouth as needed for low  blood sugar.   Past Month at Unknown time  . metoCLOPramide (REGLAN) 5 MG tablet Take 5 mg by mouth every 6 (six) hours as needed for nausea.   Past Week at Unknown time  . omeprazole (PRILOSEC OTC) 20 MG tablet Take 20 mg by mouth daily as needed (acid reflux).    Past Week at Unknown time  . OVER THE COUNTER MEDICATION Take 3 tablets by mouth 2 (two) times daily.   Past Week at Unknown time  . PREMARIN vaginal cream Place 1 Applicatorful vaginally every Monday, Wednesday, and Friday.   3 Past Week at Unknown time  . propranolol (INDERAL) 80 MG  tablet Take 80 mg by mouth every morning.    Past Week at 9am  . vitamin B-12 (CYANOCOBALAMIN) 1000 MCG tablet Take 1,000 mcg by mouth daily.   Past Week at Unknown time   Allergies  Allergen Reactions  . Caine-1 [Lidocaine Hcl] Anaphylaxis  . Morphine Anaphylaxis  . Zosyn [Piperacillin Sod-Tazobactam So] Itching    "scalp itch"-was given Benadryl to counteract.  . Codeine Nausea And Vomiting and Other (See Comments)  . Dilaudid [Hydromorphone Hcl] Nausea And Vomiting    Can take with zofran  . Morphine And Related Nausea And Vomiting  . Percocet [Oxycodone-Acetaminophen] Nausea And Vomiting  . Tramadol Nausea And Vomiting  . Vicodin [Hydrocodone-Acetaminophen] Nausea And Vomiting    History  Substance Use Topics  . Smoking status: Never Smoker   . Smokeless tobacco: Never Used     Comment: never used tobacco  . Alcohol Use: No    History reviewed. No pertinent family history.   Objective:   Patient Vitals for the past 8 hrs:  BP Temp Temp src Resp SpO2  11/23/14 0844 (!) 159/68 mmHg 98.1 F (36.7 C) Oral (!) 23 96 %         See MD Preop evaluation      Assessment:   1. Colon cancer screening. Patient is in need of screening colonoscopy 2. Portal hypertension. She has antiphospholipid syndrome with previous portal vein thrombosis and propable cirrhosis, and is chronically on anticoagulation. This is been on hold for 5 days. EGD is performed to evaluate progression of her esophageal varices.  Plan:   Will proceed at this time with EGD and colonoscopy with propofol sedation procedure has been explained in detail to the patient in the office

## 2014-11-23 NOTE — Op Note (Signed)
[facility] [place], [address]   COLONOSCOPY PROCEDURE REPORT  PATIENT: Laurie, Green  MR#: [id number] BIRTHDATE: 11/11/1945 , 43  yrs. old GENDER: female ENDOSCOPIST: [name], MD REFERRED BY:  Dr. [name], Dr. Burney Gauze PROCEDURE DATE:  2014/12/05 PROCEDURE:   Colonoscopy with Biopsy, application of Endo clip  ASA CLASS:   class III INDICATIONS:colon cancer screening in woman with known portal gastropathy and portal hypertension MEDICATIONS: propofol 460 mg for both procedures  DESCRIPTION OF PROCEDURE:   After the risks and benefits and of the procedure were explained, informed consent was obtained.        The Pentax Adult Colonscope [id number]  endoscope was introduced through the anus and advanced to the cecum. The ileocecal valve and appendices orifice were identified. The scope was withdrawn.      . The quality of the prep was    .  The instrument was then slowly withdrawn as the colon was fully examined. Estimated blood loss is zero unless otherwise noted in this procedure report. At 60 cm in the anal verge reddened area was seen that was felt to possibly be a polyp versus scope trauma. A biopsy was taken. There was continued using from the biopsy site and Endo clip was placed. The losing seem to resolve. The scope was withdrawn into the rectum and there did appear to be prominent rectal veins possible early rectal varices. No other lesions were seen throughout the entire colon.   possible rectal varices on the retro flex view          The scope was then withdrawn from the patient and the procedure completed.  WITHDRAWAL TIME: 13 minutes  COMPLICATIONS: There were no immediate complications. ENDOSCOPIC IMPRESSION: 1. Reddened Area and descending colon possible colon polyp biopsied Endo clip applied RECOMMENDATIONS: 1. we will check the path results and likely recommend routine screening. 2. Will follow patient back in  the office and 6 to 8 weeks.   cc:    Dr Burney Gauze, Dr. [name]  _______________________________ eSigned:  [name], MD 12/05/14 10:13 AM   CPT CODES: ICD CODES:  The ICD and CPT codes recommended by this software are interpretations from the data that the clinical staff has captured with the software.  The verification of the translation of this report to the ICD and CPT codes and modifiers is the sole responsibility of the health care institution and practicing physician where this report was generated.  [employer]. will not be held responsible for the validity of the ICD and CPT codes included on this report.  AMA assumes no liability for data contained or not contained herein. CPT is a Designer, television/film set of the [employer].   PATIENT NAME:  Laurie, Green MR#: [id number]

## 2014-11-23 NOTE — Op Note (Signed)
[facility] [place], [address]   ENDOSCOPY PROCEDURE REPORT  PATIENT: Laurie Green, Laurie Green  MR#: [id number] BIRTHDATE: 1945/10/29 , 85  yrs. old GENDER: female ENDOSCOPIST:Frederick Klinger Oletta Lamas, MD REFERRED BY: Dr. [name], Dr. Burney Gauze PROCEDURE DATE:  11/23/2014 PROCEDURE:   EGD ASA CLASS:    class III INDICATIONS: patient with known history of portal hypertension due to portal vein thrombosis secondary to coagulopathy and probable cirrhosis. A question of esophageal varices home prior EGD MEDICATION: please see colonoscopy report TOPICAL ANESTHETIC:   Cetacaine Spray  DESCRIPTION OF PROCEDURE:   After the risks and benefits of the procedure were explained, informed consent was obtained.  The Pentax Gastroscope [id number]  endoscope was introduced through the mouth  and advanced to the second portion of the duodenum .  The instrument was slowly withdrawn as the mucosa was fully examined. Estimated blood loss is zero unless otherwise noted in this procedure report. The duodenal bulb and 2nd duodenum were normal. The gastric antrum was normal. There was what appeared to be very mild portal gastropathy in the proximal stoma. No esophageal varices or gastric varices were seen on the retro flex view. The distal esophagus reveal minimal varices. There was no inflammation or ulceration. The scope was withdrawn. Patient tolerated the procedure well.    The scope was then withdrawn from the patient and the procedure completed.  COMPLICATIONS: There were no immediate complications.  ENDOSCOPIC IMPRESSION: 1. Minimal Esophageal Varices 2. Mild portal gastropathy in the proximal stomach. RECOMMENDATIONS: proceed with colonoscopy at this time   _______________________________ eSigned:  [name], MD 11/23/2014 10:05 AM     cc: Dr. [name], Dr. Burney Gauze  CPT CODES: ICD CODES:  The ICD and CPT codes recommended by this software  are interpretations from the data that the clinical staff has captured with the software.  The verification of the translation of this report to the ICD and CPT codes and modifiers is the sole responsibility of the health care institution and practicing physician where this report was generated.  [employer]. will not be held responsible for the validity of the ICD and CPT codes included on this report.  AMA assumes no liability for data contained or not contained herein. CPT is a Designer, television/film set of the [employer].  PATIENT NAME:  Laurie Green, Laurie Green MR#: [id number]

## 2014-11-24 ENCOUNTER — Encounter (HOSPITAL_COMMUNITY): Payer: Self-pay | Admitting: Gastroenterology

## 2015-01-04 ENCOUNTER — Ambulatory Visit (HOSPITAL_BASED_OUTPATIENT_CLINIC_OR_DEPARTMENT_OTHER): Payer: Medicare Other | Admitting: Hematology & Oncology

## 2015-01-04 ENCOUNTER — Other Ambulatory Visit (HOSPITAL_BASED_OUTPATIENT_CLINIC_OR_DEPARTMENT_OTHER): Payer: Medicare Other

## 2015-01-04 ENCOUNTER — Encounter: Payer: Self-pay | Admitting: Hematology & Oncology

## 2015-01-04 VITALS — BP 140/72 | HR 59 | Temp 97.8°F | Resp 14 | Ht 64.0 in | Wt 144.0 lb

## 2015-01-04 DIAGNOSIS — R011 Cardiac murmur, unspecified: Secondary | ICD-10-CM

## 2015-01-04 DIAGNOSIS — D6861 Antiphospholipid syndrome: Secondary | ICD-10-CM | POA: Diagnosis not present

## 2015-01-04 DIAGNOSIS — D649 Anemia, unspecified: Secondary | ICD-10-CM

## 2015-01-04 DIAGNOSIS — I82409 Acute embolism and thrombosis of unspecified deep veins of unspecified lower extremity: Secondary | ICD-10-CM

## 2015-01-04 DIAGNOSIS — I82403 Acute embolism and thrombosis of unspecified deep veins of lower extremity, bilateral: Secondary | ICD-10-CM

## 2015-01-04 LAB — CBC WITH DIFFERENTIAL (CANCER CENTER ONLY)
BASO#: 0 10*3/uL (ref 0.0–0.2)
BASO%: 0.6 % (ref 0.0–2.0)
EOS%: 3.6 % (ref 0.0–7.0)
Eosinophils Absolute: 0.2 10*3/uL (ref 0.0–0.5)
HCT: 43.8 % (ref 34.8–46.6)
HGB: 15.3 g/dL (ref 11.6–15.9)
LYMPH#: 1.8 10*3/uL (ref 0.9–3.3)
LYMPH%: 26.3 % (ref 14.0–48.0)
MCH: 33.8 pg (ref 26.0–34.0)
MCHC: 34.9 g/dL (ref 32.0–36.0)
MCV: 97 fL (ref 81–101)
MONO#: 0.6 10*3/uL (ref 0.1–0.9)
MONO%: 9.3 % (ref 0.0–13.0)
NEUT#: 4 10*3/uL (ref 1.5–6.5)
NEUT%: 60.2 % (ref 39.6–80.0)
Platelets: 311 10*3/uL (ref 145–400)
RBC: 4.53 10*6/uL (ref 3.70–5.32)
RDW: 13.4 % (ref 11.1–15.7)
WBC: 6.7 10*3/uL (ref 3.9–10.0)

## 2015-01-04 LAB — COMPREHENSIVE METABOLIC PANEL
ALT: 27 U/L (ref 6–29)
AST: 29 U/L (ref 10–35)
Albumin: 4.3 g/dL (ref 3.6–5.1)
Alkaline Phosphatase: 74 U/L (ref 33–130)
BUN: 13 mg/dL (ref 7–25)
CO2: 28 mmol/L (ref 20–31)
Calcium: 9.6 mg/dL (ref 8.6–10.4)
Chloride: 104 mmol/L (ref 98–110)
Creatinine, Ser: 0.79 mg/dL (ref 0.50–0.99)
Glucose, Bld: 168 mg/dL — ABNORMAL HIGH (ref 65–99)
Potassium: 4.2 mmol/L (ref 3.5–5.3)
Sodium: 141 mmol/L (ref 135–146)
Total Bilirubin: 1.7 mg/dL — ABNORMAL HIGH (ref 0.2–1.2)
Total Protein: 6.8 g/dL (ref 6.1–8.1)

## 2015-01-04 LAB — PROTIME-INR (CHCC SATELLITE)
INR: 1.9 — ABNORMAL LOW (ref 2.0–3.5)
Protime: 22.8 Seconds — ABNORMAL HIGH (ref 10.6–13.4)

## 2015-01-04 NOTE — Addendum Note (Signed)
Addended by: Burney Gauze R on: 01/04/2015 01:44 PM   Modules accepted: Orders

## 2015-01-04 NOTE — Progress Notes (Signed)
Hematology and Oncology Follow Up Visit  Laurie Green [id number] August 10, 1945 69 y.o. 01/04/2015   Principle Diagnosis:  1. Antiphospholipid antibody syndrome. 2. History of recurrent lower extremity deep venous thrombosis. 3. Nonalcoholic steatohepatitis.  Current Therapy:   Coumadin to maintain an INR between 2-3.     Interim History:  Ms.  Green is back for followup. She's doing about the same. She still is having problems with her blood sugars. She does not want insulin. She does not want metformin. She has a hard time with metformin. She is on supplements.  She does have the NASH. This is about the same from my point of view.  She's never had problems with recurrent blood clots for the town lab known her. I don't think she has had any blood clot issues probably for over 10 years.  Her husband recently had heart surgery for mitral valve. This was repaired. He is doing well. The surgery was back in August.   She is trying to watch her diet. She is is trying to exercise more. She is not having any fevers. There is no bleeding.  There is no problems with bowels or bladder. She has had no leg swelling or rashes.  She does have fibromyalgia. This does seem to be causing some issues right now. Usually, the change in weather does bother her.   Her performance status is ECOG 1.  Medications:  Current outpatient prescriptions:  .  benazepril (LOTENSIN) 20 MG tablet, Take 20 mg by mouth every morning. , Disp: , Rfl:  .  Cholecalciferol (VITAMIN D3) 2000 UNITS capsule, Take 2,000 Units by mouth daily., Disp: , Rfl:  .  CINNAMON PO, Take 2 tablets by mouth daily., Disp: , Rfl:  .  COUMADIN 7.5 MG tablet, TAKE 1 TABLET BY MOUTH ONCE DAILY, Disp: 30 tablet, Rfl: 3 .  glucose 4 GM chewable tablet, Chew 16 g by mouth as needed for low blood sugar., Disp: , Rfl:  .  metoCLOPramide (REGLAN) 5 MG tablet, Take 5 mg by mouth every 6 (six) hours as needed for nausea., Disp: , Rfl:  .   omeprazole (PRILOSEC OTC) 20 MG tablet, Take 20 mg by mouth daily as needed (acid reflux). , Disp: , Rfl:  .  OVER THE COUNTER MEDICATION, Take 3 tablets by mouth 2 (two) times daily., Disp: , Rfl:  .  PREMARIN vaginal cream, Place 1 Applicatorful vaginally every Monday, Wednesday, and Friday. , Disp: , Rfl: 3 .  propranolol (INDERAL) 80 MG tablet, Take 80 mg by mouth every morning. , Disp: , Rfl:  .  vitamin B-12 (CYANOCOBALAMIN) 1000 MCG tablet, Take 1,000 mcg by mouth daily., Disp: , Rfl:   Allergies:  Allergies  Allergen Reactions  . Caine-1 [Lidocaine Hcl] Anaphylaxis  . Morphine Anaphylaxis  . Zosyn [Piperacillin Sod-Tazobactam So] Itching    "scalp itch"-was given Benadryl to counteract.  . Codeine Nausea And Vomiting and Other (See Comments)  . Dilaudid [Hydromorphone Hcl] Nausea And Vomiting    Can take with zofran  . Morphine And Related Nausea And Vomiting  . Percocet [Oxycodone-Acetaminophen] Nausea And Vomiting  . Tramadol Nausea And Vomiting  . Vicodin [Hydrocodone-Acetaminophen] Nausea And Vomiting    Past Medical History, Surgical history, Social history, and Family History were reviewed and updated.  Review of Systems: As above  Physical Exam:  height is 5' 4"  (1.626 m) and weight is 144 lb (65.318 kg). Her oral temperature is 97.8 F (36.6 C). Her blood pressure is 140/72  and her pulse is 59. Her respiration is 14.   Well-developed and well-nourished white female in no obvious distress. Head and neck exam shows no ocular or oral lesions. There are no palpable cervical or supraclavicular this. Lungs are clear bilateral. Cardiac exam regular rate and rhythm with no murmurs rubs or bruits. Abdomen is soft. 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 scar. She has no hepatomegaly. There is no fluid wave. Back exam shows no tenderness over spine ribs or hips. Extremities shows no clubbing cyanosis or edema. Skin exam no rashes, ecchymoses or petechia.  Neurological exam is nonfocal. Lab Results  Component Value Date   WBC 6.7 01/04/2015   HGB 15.3 01/04/2015   HCT 43.8 01/04/2015   MCV 97 01/04/2015   PLT 311 01/04/2015     Chemistry      Component Value Date/Time   NA 141 11/03/2014 1035   NA 144 08/26/2014 1408   K 4.3 11/03/2014 1035   K 4.0 08/26/2014 1408   CL 102 11/03/2014 1035   CL 101 08/26/2014 1408   CO2 29 11/03/2014 1035   CO2 32 08/26/2014 1408   BUN 15 11/03/2014 1035   BUN 16 08/26/2014 1408   CREATININE 0.72 11/03/2014 1035   CREATININE 0.9 08/26/2014 1408      Component Value Date/Time   CALCIUM 9.6 11/03/2014 1035   CALCIUM 9.5 08/26/2014 1408   ALKPHOS 68 11/03/2014 1035   ALKPHOS 69 08/26/2014 1408   AST 24 11/03/2014 1035   AST 30 08/26/2014 1408   ALT 21 11/03/2014 1035   ALT 28 08/26/2014 1408   BILITOT 1.4* 11/03/2014 1035   BILITOT 1.50 08/26/2014 1408      INR is 1.9  Impression and Plan: Laurie Green is 69 year old white female with the anti-phospholipid antibody syndrome. This really has not been a problem for her. The bigger issue has been the hepatic problems. She has steatosis. She has NASH.  I will still check her pro time monthly.  I will plan to see her back in about 2 months.  [name], MD 9/19/201611:20 AM

## 2015-01-05 ENCOUNTER — Telehealth: Payer: Self-pay | Admitting: Hematology & Oncology

## 2015-01-05 NOTE — Telephone Encounter (Signed)
Called patient at home. Talked to patient. Informed patient of her upcoming appts.       [facility].

## 2015-01-11 ENCOUNTER — Ambulatory Visit (HOSPITAL_COMMUNITY)
Admission: RE | Admit: 2015-01-11 | Discharge: 2015-01-11 | Disposition: A | Discharge status: Home / Self Care | Payer: Medicare Other | Source: Ambulatory Visit | Attending: Hematology & Oncology | Admitting: Hematology & Oncology

## 2015-01-11 ENCOUNTER — Telehealth: Payer: Self-pay | Admitting: Nurse Practitioner

## 2015-01-11 DIAGNOSIS — E119 Type 2 diabetes mellitus without complications: Secondary | ICD-10-CM | POA: Diagnosis not present

## 2015-01-11 DIAGNOSIS — D6861 Antiphospholipid syndrome: Secondary | ICD-10-CM | POA: Diagnosis not present

## 2015-01-11 DIAGNOSIS — I071 Rheumatic tricuspid insufficiency: Secondary | ICD-10-CM | POA: Diagnosis not present

## 2015-01-11 DIAGNOSIS — R011 Cardiac murmur, unspecified: Secondary | ICD-10-CM | POA: Diagnosis not present

## 2015-01-11 NOTE — Telephone Encounter (Addendum)
Pt verbalized understanding and appreciation. -  ---- Message from [name], MD sent at 01/11/2015  1:18 PM EDT ----- Call - echo shows that heart function is ok !!  [name]

## 2015-01-11 NOTE — Progress Notes (Signed)
  Echocardiogram 2D Echocardiogram has been performed.  Laurie Green 01/11/2015, 12:05 PM

## 2015-01-14 ENCOUNTER — Other Ambulatory Visit: Payer: Self-pay | Admitting: Hematology & Oncology

## 2015-03-08 ENCOUNTER — Other Ambulatory Visit: Payer: Medicare Other

## 2015-03-08 ENCOUNTER — Ambulatory Visit: Payer: Medicare Other | Admitting: Hematology & Oncology

## 2015-03-15 ENCOUNTER — Telehealth: Payer: Self-pay | Admitting: Hematology & Oncology

## 2015-03-15 ENCOUNTER — Ambulatory Visit: Payer: Medicare Other | Admitting: Hematology & Oncology

## 2015-03-15 ENCOUNTER — Other Ambulatory Visit: Payer: Medicare Other

## 2015-03-15 NOTE — Telephone Encounter (Signed)
Patient called and cx 03/15/15 apt due to husband having surgery.  Apt was resch for 03/30/15.  Patient is aware of apt

## 2015-03-25 ENCOUNTER — Emergency Department (HOSPITAL_BASED_OUTPATIENT_CLINIC_OR_DEPARTMENT_OTHER)
Admission: EM | Admit: 2015-03-25 | Discharge: 2015-03-26 | Disposition: A | Discharge status: Home / Self Care | Payer: Medicare Other | Attending: Emergency Medicine | Admitting: Emergency Medicine

## 2015-03-25 ENCOUNTER — Encounter (HOSPITAL_BASED_OUTPATIENT_CLINIC_OR_DEPARTMENT_OTHER): Payer: Self-pay

## 2015-03-25 DIAGNOSIS — K219 Gastro-esophageal reflux disease without esophagitis: Secondary | ICD-10-CM | POA: Insufficient documentation

## 2015-03-25 DIAGNOSIS — Z862 Personal history of diseases of the blood and blood-forming organs and certain disorders involving the immune mechanism: Secondary | ICD-10-CM | POA: Diagnosis not present

## 2015-03-25 DIAGNOSIS — M069 Rheumatoid arthritis, unspecified: Secondary | ICD-10-CM | POA: Insufficient documentation

## 2015-03-25 DIAGNOSIS — J45909 Unspecified asthma, uncomplicated: Secondary | ICD-10-CM | POA: Insufficient documentation

## 2015-03-25 DIAGNOSIS — Z86718 Personal history of other venous thrombosis and embolism: Secondary | ICD-10-CM | POA: Diagnosis not present

## 2015-03-25 DIAGNOSIS — Z7901 Long term (current) use of anticoagulants: Secondary | ICD-10-CM | POA: Diagnosis not present

## 2015-03-25 DIAGNOSIS — T2121XA Burn of second degree of chest wall, initial encounter: Secondary | ICD-10-CM | POA: Diagnosis not present

## 2015-03-25 DIAGNOSIS — Z79899 Other long term (current) drug therapy: Secondary | ICD-10-CM | POA: Diagnosis not present

## 2015-03-25 DIAGNOSIS — R011 Cardiac murmur, unspecified: Secondary | ICD-10-CM | POA: Insufficient documentation

## 2015-03-25 DIAGNOSIS — I10 Essential (primary) hypertension: Secondary | ICD-10-CM | POA: Diagnosis not present

## 2015-03-25 DIAGNOSIS — E119 Type 2 diabetes mellitus without complications: Secondary | ICD-10-CM | POA: Insufficient documentation

## 2015-03-25 DIAGNOSIS — Y9289 Other specified places as the place of occurrence of the external cause: Secondary | ICD-10-CM | POA: Insufficient documentation

## 2015-03-25 DIAGNOSIS — Z8673 Personal history of transient ischemic attack (TIA), and cerebral infarction without residual deficits: Secondary | ICD-10-CM | POA: Diagnosis not present

## 2015-03-25 DIAGNOSIS — T300 Burn of unspecified body region, unspecified degree: Secondary | ICD-10-CM

## 2015-03-25 DIAGNOSIS — Y998 Other external cause status: Secondary | ICD-10-CM | POA: Diagnosis not present

## 2015-03-25 DIAGNOSIS — T2101XA Burn of unspecified degree of chest wall, initial encounter: Secondary | ICD-10-CM | POA: Diagnosis present

## 2015-03-25 DIAGNOSIS — Y9389 Activity, other specified: Secondary | ICD-10-CM | POA: Insufficient documentation

## 2015-03-25 DIAGNOSIS — X12XXXA Contact with other hot fluids, initial encounter: Secondary | ICD-10-CM | POA: Diagnosis not present

## 2015-03-25 MED ORDER — OXYCODONE-ACETAMINOPHEN 5-325 MG PO TABS
1.0000 | ORAL_TABLET | Freq: Once | ORAL | Status: AC
  Administered 2015-03-26: 1 via ORAL
  Filled 2015-03-25: qty 1

## 2015-03-25 MED ORDER — ONDANSETRON 4 MG PO TBDP: 4.0000 mg | ORAL_TABLET | Freq: Three times a day (TID) | ORAL | Status: DC | PRN

## 2015-03-25 MED ORDER — OXYCODONE-ACETAMINOPHEN 5-325 MG PO TABS: 1.0000 | ORAL_TABLET | Freq: Four times a day (QID) | ORAL | Status: DC | PRN

## 2015-03-25 MED ORDER — ONDANSETRON 4 MG PO TBDP
4.0000 mg | ORAL_TABLET | Freq: Once | ORAL | Status: AC
  Administered 2015-03-26: 4 mg via ORAL
  Filled 2015-03-25: qty 1

## 2015-03-25 MED ORDER — BACITRACIN ZINC 500 UNIT/GM EX OINT: 1.0000 "application " | TOPICAL_OINTMENT | Freq: Two times a day (BID) | CUTANEOUS | Status: DC
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
## 2015-03-25 NOTE — Discharge Instructions (Signed)
Burn Care °Your skin is a natural barrier to infection. It is the largest organ of your body. Burns damage this natural protection. To help prevent infection, it is very important to follow your caregiver's instructions in the care of your burn. °Burns are classified as: °· First degree. There is only redness of the skin (erythema). No scarring is expected. °· Second degree. There is blistering of the skin. Scarring may occur with deeper burns. °· Third degree. All layers of the skin are injured, and scarring is expected. °HOME CARE INSTRUCTIONS  °· Wash your hands well before changing your bandage. °· Change your bandage as often as directed by your caregiver. °¨ Remove the old bandage. If the bandage sticks, you may soak it off with cool, clean water. °¨ Cleanse the burn thoroughly but gently with mild soap and water. °¨ Pat the area dry with a clean, dry cloth. °¨ Apply a thin layer of antibacterial cream to the burn. °¨ Apply a clean bandage as instructed by your caregiver. °¨ Keep the bandage as clean and dry as possible. °· Elevate the affected area for the first 24 hours, then as instructed by your caregiver. °· Only take over-the-counter or prescription medicines for pain, discomfort, or fever as directed by your caregiver. °SEEK IMMEDIATE MEDICAL CARE IF:  °· You develop excessive pain. °· You develop redness, tenderness, swelling, or red streaks near the burn. °· The burned area develops yellowish-white fluid (pus) or a bad smell. °· You have a fever. °MAKE SURE YOU:  °· Understand these instructions. °· Will watch your condition. °· Will get help right away if you are not doing well or get worse. °  °This information is not intended to replace advice given to you by your health care provider. Make sure you discuss any questions you have with your health care provider. °  °Document Released: 04/03/2005 Document Revised: 06/26/2011 Document Reviewed: 08/24/2010 °Elsevier Interactive Patient Education ©2016  [employer] Inc. ° °

## 2015-03-25 NOTE — ED Notes (Signed)
Spilled hot soup on chest  Area red  No blisters noted

## 2015-03-30 ENCOUNTER — Other Ambulatory Visit (HOSPITAL_BASED_OUTPATIENT_CLINIC_OR_DEPARTMENT_OTHER): Payer: Medicare Other

## 2015-03-30 ENCOUNTER — Ambulatory Visit (HOSPITAL_BASED_OUTPATIENT_CLINIC_OR_DEPARTMENT_OTHER): Payer: Medicare Other | Admitting: Family

## 2015-03-30 ENCOUNTER — Encounter: Payer: Self-pay | Admitting: Family

## 2015-03-30 VITALS — BP 148/64 | HR 61 | Temp 97.7°F | Resp 18 | Ht 64.0 in | Wt 145.0 lb

## 2015-03-30 DIAGNOSIS — Z7901 Long term (current) use of anticoagulants: Secondary | ICD-10-CM

## 2015-03-30 DIAGNOSIS — R011 Cardiac murmur, unspecified: Secondary | ICD-10-CM

## 2015-03-30 DIAGNOSIS — D6861 Antiphospholipid syndrome: Secondary | ICD-10-CM

## 2015-03-30 LAB — COMPREHENSIVE METABOLIC PANEL
ALT: 44 U/L (ref 0–55)
AST: 44 U/L — ABNORMAL HIGH (ref 5–34)
Albumin: 3.6 g/dL (ref 3.5–5.0)
Alkaline Phosphatase: 84 U/L (ref 40–150)
Anion Gap: 8 mEq/L (ref 3–11)
BUN: 19.3 mg/dL (ref 7.0–26.0)
CO2: 28 mEq/L (ref 22–29)
Calcium: 9.3 mg/dL (ref 8.4–10.4)
Chloride: 105 mEq/L (ref 98–109)
Creatinine: 0.8 mg/dL (ref 0.6–1.1)
EGFR: 74 mL/min/{1.73_m2} — ABNORMAL LOW (ref >90)
Glucose: 159 mg/dl — ABNORMAL HIGH (ref 70–140)
Potassium: 4 mEq/L (ref 3.5–5.1)
Sodium: 140 mEq/L (ref 136–145)
Total Bilirubin: 1.22 mg/dL — ABNORMAL HIGH (ref 0.20–1.20)
Total Protein: 6.9 g/dL (ref 6.4–8.3)

## 2015-03-30 LAB — CBC WITH DIFFERENTIAL (CANCER CENTER ONLY)
BASO#: 0 10*3/uL (ref 0.0–0.2)
BASO%: 0.7 % (ref 0.0–2.0)
EOS%: 4.3 % (ref 0.0–7.0)
Eosinophils Absolute: 0.2 10*3/uL (ref 0.0–0.5)
HCT: 42.4 % (ref 34.8–46.6)
HGB: 14.7 g/dL (ref 11.6–15.9)
LYMPH#: 1.5 10*3/uL (ref 0.9–3.3)
LYMPH%: 27 % (ref 14.0–48.0)
MCH: 33 pg (ref 26.0–34.0)
MCHC: 34.7 g/dL (ref 32.0–36.0)
MCV: 95 fL (ref 81–101)
MONO#: 0.7 10*3/uL (ref 0.1–0.9)
MONO%: 12.8 % (ref 0.0–13.0)
NEUT#: 3 10*3/uL (ref 1.5–6.5)
NEUT%: 55.2 % (ref 39.6–80.0)
Platelets: 309 10*3/uL (ref 145–400)
RBC: 4.45 10*6/uL (ref 3.70–5.32)
RDW: 12.9 % (ref 11.1–15.7)
WBC: 5.4 10*3/uL (ref 3.9–10.0)

## 2015-03-30 LAB — PROTIME-INR (CHCC SATELLITE)
INR: 1.8 — ABNORMAL LOW (ref 2.0–3.5)
Protime: 21.6 Seconds — ABNORMAL HIGH (ref 10.6–13.4)

## 2015-03-30 NOTE — Progress Notes (Signed)
Hematology and Oncology Follow Up Visit  Laurie Green [id number] May 15, 1945 69 y.o. 03/30/2015  Principle Diagnosis:  1. Antiphospholipid antibody syndrome  Current Therapy:   Coumadin 7.5 mg daily -  maintain an INR between 2-3.    Interim History:  Laurie Green is here today for a follow-up. She is doing well but has had some abdominal discomfort and bloating. She has had some mild constipation. She plans to try Mirilax BID. She has done well on Coumadin. Her INR today was 1.8. No episodes of bleeding or bruising.  Her endoscopy in August showed minimal esophageal varices. She had a benign polyp removed during her coloscopy.   She does have bulging discs in her neck and has gotten steroid injections in the past. She states that she is not a candidate for surgery at this time.  She denies fever, chills, n/v, cough, rash, dizziness, headache, vision changes, SOB, chest pain, palpitations and changes in bladder habits.  No swelling, tenderness, numbness or tingling in her extremities.  She is eating healthy and staying well hydrated. Her weight is stable.   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   Medications:    Medication List       This list is accurate as of: 03/30/15  9:30 PM.  Always use your most recent med list.               bacitracin ointment  Apply 1 application topically 2 (two) times daily.     benazepril 20 MG tablet  Commonly known as:  LOTENSIN  Take 20 mg by mouth every morning.     CINNAMON PO  Take 2 tablets by mouth daily.     COUMADIN 7.5 MG tablet  Generic drug:  warfarin  TAKE 1 TABLET BY MOUTH ONCE DAILY     glucose 4 GM chewable tablet  Chew 16 g by mouth as needed for low blood sugar.     metoCLOPramide 5 MG tablet  Commonly known as:  REGLAN  Take 5 mg by mouth every 6 (six) hours as needed for nausea.     omeprazole 20 MG tablet  Commonly known as:  PRILOSEC OTC  Take 20 mg by mouth  daily as needed (acid reflux).     ondansetron 4 MG disintegrating tablet  Commonly known as:  ZOFRAN-ODT  Take 1 tablet (4 mg total) by mouth every 8 (eight) hours as needed for nausea or vomiting.     OVER THE COUNTER MEDICATION  Take 3 tablets by mouth 2 (two) times daily.     oxyCODONE-acetaminophen 5-325 MG tablet  Commonly known as:  PERCOCET/ROXICET  Take 1 tablet by mouth every 6 (six) hours as needed for severe pain.     PREMARIN vaginal cream  Generic drug:  conjugated estrogens  Place 1 Applicatorful vaginally every Monday, Wednesday, and Friday.     propranolol 80 MG tablet  Commonly known as:  INDERAL  Take 80 mg by mouth every morning.     vitamin B-12 1000 MCG tablet  Commonly known as:  CYANOCOBALAMIN  Take 1,000 mcg by mouth daily.     Vitamin D3 2000 UNITS capsule  Take 2,000 Units by mouth daily.        Allergies:  Allergies  Allergen Reactions  . Caine-1 [Lidocaine Hcl] Anaphylaxis  . Morphine Anaphylaxis  . Zosyn [Piperacillin Sod-Tazobactam So] Itching    "scalp itch"-was given Benadryl to counteract.  . Codeine Nausea  And Vomiting and Other (See Comments)  . Dilaudid [Hydromorphone Hcl] Nausea And Vomiting    Can take with zofran  . Morphine And Related Nausea And Vomiting  . Percocet [Oxycodone-Acetaminophen] Nausea And Vomiting  . Tramadol Nausea And Vomiting  . Vicodin [Hydrocodone-Acetaminophen] Nausea And Vomiting    Past Medical History, Surgical history, Social history, and Family History were reviewed and updated.  Review of Systems: All other 10 point review of systems is negative.   Physical Exam:  height is 5' 4"  (1.626 m) and weight is 145 lb (65.772 kg). Her oral temperature is 97.7 F (36.5 C). Her blood pressure is 148/64 and her pulse is 61. Her respiration is 18.   Wt Readings from Last 3 Encounters:  03/30/15 145 lb (65.772 kg)  03/25/15 142 lb (64.411 kg)  01/04/15 144 lb (65.318 kg)    Ocular: Sclerae unicteric,  pupils equal, round and reactive to light Ear-nose-throat: Oropharynx clear, dentition fair Lymphatic: No cervical or supraclavicular adenopathy Lungs no rales or rhonchi, good excursion bilaterally Heart regular rate and rhythm, no murmur appreciated Abd soft, nontender, positive bowel sounds MSK no focal spinal tenderness, no joint edema Neuro: non-focal, well-oriented, appropriate affect Breasts: Deferred.   Lab Results  Component Value Date   WBC 5.4 03/30/2015   HGB 14.7 03/30/2015   HCT 42.4 03/30/2015   MCV 95 03/30/2015   PLT 309 03/30/2015   Lab Results  Component Value Date   FERRITIN 80 03/09/2014   IRON 96 03/09/2014   TIBC 316 03/09/2014   UIBC 220 03/09/2014   IRONPCTSAT 30 03/09/2014   Lab Results  Component Value Date   RBC 4.45 03/30/2015   No results found for: KPAFRELGTCHN, LAMBDASER, KAPLAMBRATIO No results found for: IGGSERUM, IGA, IGMSERUM No results found for: Kathrynn Ducking, MSPIKE, SPEI   Chemistry      Component Value Date/Time   NA 140 03/30/2015 1023   NA 141 01/04/2015 0925   NA 144 08/26/2014 1408   K 4.0 03/30/2015 1023   K 4.2 01/04/2015 0925   K 4.0 08/26/2014 1408   CL 104 01/04/2015 0925   CL 101 08/26/2014 1408   CO2 28 03/30/2015 1023   CO2 28 01/04/2015 0925   CO2 32 08/26/2014 1408   BUN 19.3 03/30/2015 1023   BUN 13 01/04/2015 0925   BUN 16 08/26/2014 1408   CREATININE 0.8 03/30/2015 1023   CREATININE 0.79 01/04/2015 0925   CREATININE 0.9 08/26/2014 1408      Component Value Date/Time   CALCIUM 9.3 03/30/2015 1023   CALCIUM 9.6 01/04/2015 0925   CALCIUM 9.5 08/26/2014 1408   ALKPHOS 84 03/30/2015 1023   ALKPHOS 74 01/04/2015 0925   ALKPHOS 69 08/26/2014 1408   AST 44* 03/30/2015 1023   AST 29 01/04/2015 0925   AST 30 08/26/2014 1408   ALT 44 03/30/2015 1023   ALT 27 01/04/2015 0925   ALT 28 08/26/2014 1408   BILITOT 1.22* 03/30/2015 1023   BILITOT 1.7* 01/04/2015  0925   BILITOT 1.50 08/26/2014 1408     Impression and Plan: Laurie Green is a very pleasant 69 year old white female with antiphospholipid antibody syndrome. It has been years since she experienced her last blood clot. She has responded nicely to Coumadin and had no problems with bleeding or bruising.  Her INR at this time is 1.8. We will continue her on her same dose for Coumadin for now.  She will continue to have her INR  checked monthly. We will see her back in 2 months for labs and follow-up. She will contact us with any questions or concerns. We can certainly see her sooner if need be.   Eliezer Bottom, NP 12/13/20169:30 PM

## 2015-04-01 ENCOUNTER — Telehealth: Payer: Self-pay | Admitting: *Deleted

## 2015-04-01 NOTE — Telephone Encounter (Addendum)
Patient aware of results. Lab appointment scheduled. Patient aware.  ----- Message from [name], MD sent at 03/31/2015  5:16 PM EST ----- Call - labs and INR look ok!!  Do not change the coumadin dose.  Need to make sure that the INR is checked in 3 weeks.  Laurie Green

## 2015-04-22 ENCOUNTER — Other Ambulatory Visit (HOSPITAL_BASED_OUTPATIENT_CLINIC_OR_DEPARTMENT_OTHER): Payer: Medicare Other

## 2015-04-22 DIAGNOSIS — I82403 Acute embolism and thrombosis of unspecified deep veins of lower extremity, bilateral: Secondary | ICD-10-CM

## 2015-04-22 DIAGNOSIS — K7581 Nonalcoholic steatohepatitis (NASH): Secondary | ICD-10-CM

## 2015-04-22 DIAGNOSIS — D6861 Antiphospholipid syndrome: Secondary | ICD-10-CM

## 2015-04-22 LAB — PROTIME-INR (CHCC SATELLITE)
INR: 1.3 — ABNORMAL LOW (ref 2.0–3.5)
Protime: 15.6 Seconds — ABNORMAL HIGH (ref 10.6–13.4)

## 2015-04-26 ENCOUNTER — Other Ambulatory Visit: Payer: Self-pay | Admitting: Family

## 2015-05-03 ENCOUNTER — Other Ambulatory Visit: Payer: Medicare Other

## 2015-05-10 ENCOUNTER — Other Ambulatory Visit (HOSPITAL_BASED_OUTPATIENT_CLINIC_OR_DEPARTMENT_OTHER): Payer: Medicare Other

## 2015-05-10 DIAGNOSIS — D6861 Antiphospholipid syndrome: Secondary | ICD-10-CM

## 2015-05-10 LAB — CBC WITH DIFFERENTIAL (CANCER CENTER ONLY)
BASO#: 0.1 10*3/uL (ref 0.0–0.2)
BASO%: 0.9 % (ref 0.0–2.0)
EOS%: 3.6 % (ref 0.0–7.0)
Eosinophils Absolute: 0.2 10*3/uL (ref 0.0–0.5)
HCT: 43.9 % (ref 34.8–46.6)
HGB: 15 g/dL (ref 11.6–15.9)
LYMPH#: 2.2 10*3/uL (ref 0.9–3.3)
LYMPH%: 40.3 % (ref 14.0–48.0)
MCH: 32.8 pg (ref 26.0–34.0)
MCHC: 34.2 g/dL (ref 32.0–36.0)
MCV: 96 fL (ref 81–101)
MONO#: 0.7 10*3/uL (ref 0.1–0.9)
MONO%: 12.7 % (ref 0.0–13.0)
NEUT#: 2.4 10*3/uL (ref 1.5–6.5)
NEUT%: 42.5 % (ref 39.6–80.0)
Platelets: 301 10*3/uL (ref 145–400)
RBC: 4.58 10*6/uL (ref 3.70–5.32)
RDW: 13.1 % (ref 11.1–15.7)
WBC: 5.5 10*3/uL (ref 3.9–10.0)

## 2015-05-10 LAB — COMPREHENSIVE METABOLIC PANEL
ALT: 29 U/L (ref 0–55)
AST: 28 U/L (ref 5–34)
Albumin: 3.8 g/dL (ref 3.5–5.0)
Alkaline Phosphatase: 85 U/L (ref 40–150)
Anion Gap: 8 mEq/L (ref 3–11)
BUN: 16 mg/dL (ref 7.0–26.0)
CO2: 30 mEq/L — ABNORMAL HIGH (ref 22–29)
Calcium: 9.6 mg/dL (ref 8.4–10.4)
Chloride: 104 mEq/L (ref 98–109)
Creatinine: 0.8 mg/dL (ref 0.6–1.1)
EGFR: 73 mL/min/{1.73_m2} — ABNORMAL LOW (ref >90)
Glucose: 194 mg/dl — ABNORMAL HIGH (ref 70–140)
Potassium: 4.1 mEq/L (ref 3.5–5.1)
Sodium: 142 mEq/L (ref 136–145)
Total Bilirubin: 0.93 mg/dL (ref 0.20–1.20)
Total Protein: 7.1 g/dL (ref 6.4–8.3)

## 2015-05-13 ENCOUNTER — Other Ambulatory Visit: Payer: Self-pay | Admitting: *Deleted

## 2015-05-13 DIAGNOSIS — I82509 Chronic embolism and thrombosis of unspecified deep veins of unspecified lower extremity: Secondary | ICD-10-CM

## 2015-05-14 ENCOUNTER — Other Ambulatory Visit (HOSPITAL_BASED_OUTPATIENT_CLINIC_OR_DEPARTMENT_OTHER): Payer: Medicare Other

## 2015-05-14 ENCOUNTER — Telehealth: Payer: Self-pay | Admitting: *Deleted

## 2015-05-14 ENCOUNTER — Other Ambulatory Visit: Payer: Self-pay | Admitting: *Deleted

## 2015-05-14 DIAGNOSIS — I82509 Chronic embolism and thrombosis of unspecified deep veins of unspecified lower extremity: Secondary | ICD-10-CM

## 2015-05-14 LAB — PROTIME-INR (CHCC SATELLITE)
INR: 1.4 — ABNORMAL LOW (ref 2.0–3.5)
Protime: 16.8 Seconds — ABNORMAL HIGH (ref 10.6–13.4)

## 2015-05-14 MED ORDER — WARFARIN SODIUM 10 MG PO TABS: 10.0000 mg | ORAL_TABLET | Freq: Every day | ORAL | Status: DC

## 2015-05-14 NOTE — Telephone Encounter (Signed)
Received call from patient wanting to know her PT results from earlier in the week. When results checked it was found that a PT was not drawn. Patient rescheduled this afternoon for additional blood draw for PT results.

## 2015-05-14 NOTE — Telephone Encounter (Signed)
Patient INR still low at 1.4. Dr Marin Olp wants patient to take Coumadin 41m daily until she is rechecked at her next scheduled appointment - February 16th. New prescription sent. Patient aware of lab value, prescription and follow up appointment.

## 2015-05-19 ENCOUNTER — Other Ambulatory Visit: Payer: Self-pay | Admitting: Hematology & Oncology

## 2015-06-02 ENCOUNTER — Other Ambulatory Visit: Payer: Self-pay | Admitting: *Deleted

## 2015-06-02 DIAGNOSIS — I82509 Chronic embolism and thrombosis of unspecified deep veins of unspecified lower extremity: Secondary | ICD-10-CM

## 2015-06-03 ENCOUNTER — Encounter: Payer: Self-pay | Admitting: Hematology & Oncology

## 2015-06-03 ENCOUNTER — Other Ambulatory Visit (HOSPITAL_BASED_OUTPATIENT_CLINIC_OR_DEPARTMENT_OTHER): Payer: Medicare Other

## 2015-06-03 ENCOUNTER — Ambulatory Visit (HOSPITAL_BASED_OUTPATIENT_CLINIC_OR_DEPARTMENT_OTHER): Payer: Medicare Other | Admitting: Hematology & Oncology

## 2015-06-03 VITALS — BP 134/73 | HR 58 | Temp 97.6°F | Resp 16 | Ht 64.0 in | Wt 147.0 lb

## 2015-06-03 DIAGNOSIS — D6861 Antiphospholipid syndrome: Secondary | ICD-10-CM | POA: Diagnosis not present

## 2015-06-03 DIAGNOSIS — I82409 Acute embolism and thrombosis of unspecified deep veins of unspecified lower extremity: Secondary | ICD-10-CM

## 2015-06-03 DIAGNOSIS — E119 Type 2 diabetes mellitus without complications: Secondary | ICD-10-CM

## 2015-06-03 DIAGNOSIS — M545 Low back pain: Secondary | ICD-10-CM | POA: Diagnosis not present

## 2015-06-03 DIAGNOSIS — K7581 Nonalcoholic steatohepatitis (NASH): Secondary | ICD-10-CM | POA: Diagnosis not present

## 2015-06-03 DIAGNOSIS — I82509 Chronic embolism and thrombosis of unspecified deep veins of unspecified lower extremity: Secondary | ICD-10-CM

## 2015-06-03 LAB — COMPREHENSIVE METABOLIC PANEL
ALT: 24 U/L (ref 0–55)
AST: 26 U/L (ref 5–34)
Albumin: 3.8 g/dL (ref 3.5–5.0)
Alkaline Phosphatase: 80 U/L (ref 40–150)
Anion Gap: 9 mEq/L (ref 3–11)
BUN: 17.1 mg/dL (ref 7.0–26.0)
CO2: 28 mEq/L (ref 22–29)
Calcium: 9.7 mg/dL (ref 8.4–10.4)
Chloride: 104 mEq/L (ref 98–109)
Creatinine: 0.9 mg/dL (ref 0.6–1.1)
EGFR: 69 mL/min/{1.73_m2} — ABNORMAL LOW (ref >90)
Glucose: 197 mg/dl — ABNORMAL HIGH (ref 70–140)
Potassium: 4.2 mEq/L (ref 3.5–5.1)
Sodium: 141 mEq/L (ref 136–145)
Total Bilirubin: 1.22 mg/dL — ABNORMAL HIGH (ref 0.20–1.20)
Total Protein: 7.2 g/dL (ref 6.4–8.3)

## 2015-06-03 LAB — CBC WITH DIFFERENTIAL (CANCER CENTER ONLY)
BASO#: 0 10*3/uL (ref 0.0–0.2)
BASO%: 0.5 % (ref 0.0–2.0)
EOS%: 2.6 % (ref 0.0–7.0)
Eosinophils Absolute: 0.2 10*3/uL (ref 0.0–0.5)
HCT: 44.5 % (ref 34.8–46.6)
HGB: 15.4 g/dL (ref 11.6–15.9)
LYMPH#: 1.8 10*3/uL (ref 0.9–3.3)
LYMPH%: 21.1 % (ref 14.0–48.0)
MCH: 32.6 pg (ref 26.0–34.0)
MCHC: 34.6 g/dL (ref 32.0–36.0)
MCV: 94 fL (ref 81–101)
MONO#: 0.8 10*3/uL (ref 0.1–0.9)
MONO%: 9.4 % (ref 0.0–13.0)
NEUT#: 5.8 10*3/uL (ref 1.5–6.5)
NEUT%: 66.4 % (ref 39.6–80.0)
Platelets: 295 10*3/uL (ref 145–400)
RBC: 4.72 10*6/uL (ref 3.70–5.32)
RDW: 13.2 % (ref 11.1–15.7)
WBC: 8.7 10*3/uL (ref 3.9–10.0)

## 2015-06-03 LAB — PROTIME-INR (CHCC SATELLITE)
INR: 3.7 — ABNORMAL HIGH (ref 2.0–3.5)
Protime: 44.4 Seconds — ABNORMAL HIGH (ref 10.6–13.4)

## 2015-06-03 NOTE — Progress Notes (Signed)
Hematology and Oncology Follow Up Visit  ILY DENNO [id number] 07/07/1945 70 y.o. 06/03/2015   Principle Diagnosis:  1. Antiphospholipid antibody syndrome. 2. History of recurrent lower extremity deep venous thrombosis. 3. Nonalcoholic steatohepatitis.  Current Therapy:   Coumadin to maintain an INR between 2-3.     Interim History:  Ms.  Seder is back for followup. She actually had a stem cell injection into her back. She is having severe lower back pain. She has bulging disks. There is nothing that can be done surgically from what she tells me. She is very disappointed by the surgeon that she saw.  She suddenly went to a chiropractor who injected stem cells into her back. I did not realize that this was FDA approved. I suppose it must be. She says that her back is feeling a little better.  Otherwise, she's been doing okay. Her blood sugars have been on the high side.  She has had no issues with liver exacerbation. She does have NASH. It does not signed this is any worse.  She's had no change in bowel or bladder habits. She's had no cough. There has been no bleeding.  As [place], [place] to [place]. She always enjoys this.   Her performance status is ECOG 1.  Medications:  Current outpatient prescriptions:  .  bacitracin ointment, Apply 1 application topically 2 (two) times daily., Disp: 453.6 g, Rfl: 0 .  benazepril (LOTENSIN) 20 MG tablet, Take 20 mg by mouth every morning. , Disp: , Rfl:  .  Cholecalciferol (VITAMIN D3) 2000 UNITS capsule, Take 2,000 Units by mouth daily., Disp: , Rfl:  .  CINNAMON PO, Take 2 tablets by mouth daily., Disp: , Rfl:  .  COUMADIN 7.5 MG tablet, TAKE 1 TABLET BY MOUTH ONCE DAILY, Disp: 30 tablet, Rfl: 3 .  glucose 4 GM chewable tablet, Chew 16 g by mouth as needed for low blood sugar., Disp: , Rfl:  .  metoCLOPramide (REGLAN) 5 MG tablet, Take 5 mg by mouth every 6 (six) hours as needed for nausea., Disp: , Rfl:  .   omeprazole (PRILOSEC OTC) 20 MG tablet, Take 20 mg by mouth daily as needed (acid reflux). , Disp: , Rfl:  .  ondansetron (ZOFRAN-ODT) 4 MG disintegrating tablet, Take 1 tablet (4 mg total) by mouth every 8 (eight) hours as needed for nausea or vomiting., Disp: 20 tablet, Rfl: 0 .  OVER THE COUNTER MEDICATION, Take 3 tablets by mouth 2 (two) times daily., Disp: , Rfl:  .  oxyCODONE-acetaminophen (PERCOCET/ROXICET) 5-325 MG tablet, Take 1 tablet by mouth every 6 (six) hours as needed for severe pain., Disp: 15 tablet, Rfl: 0 .  PREMARIN vaginal cream, Place 1 Applicatorful vaginally every Monday, Wednesday, and Friday. , Disp: , Rfl: 3 .  propranolol (INDERAL) 80 MG tablet, Take 80 mg by mouth every morning. , Disp: , Rfl:  .  vitamin B-12 (CYANOCOBALAMIN) 1000 MCG tablet, Take 1,000 mcg by mouth daily., Disp: , Rfl:  .  warfarin (COUMADIN) 10 MG tablet, Take 1 tablet (10 mg total) by mouth daily at 6 PM., Disp: 30 tablet, Rfl: 0  Allergies:  Allergies  Allergen Reactions  . Caine-1 [Lidocaine Hcl] Anaphylaxis  . Morphine Anaphylaxis  . Zosyn [Piperacillin Sod-Tazobactam So] Itching    "scalp itch"-was given Benadryl to counteract.  . Codeine Nausea And Vomiting and Other (See Comments)  . Dilaudid [Hydromorphone Hcl] Nausea And Vomiting    Can take with zofran  .  Morphine And Related Nausea And Vomiting  . Percocet [Oxycodone-Acetaminophen] Nausea And Vomiting  . Tramadol Nausea And Vomiting  . Vicodin [Hydrocodone-Acetaminophen] Nausea And Vomiting    Past Medical History, Surgical history, Social history, and Family History were reviewed and updated.  Review of Systems: As above  Physical Exam:  height is 5' 4"  (1.626 m) and weight is 147 lb (66.679 kg). Her oral temperature is 97.6 F (36.4 C). Her blood pressure is 134/73 and her pulse is 58. Her respiration is 16.   Well-developed and well-nourished white female in no obvious distress. Head and neck exam shows no ocular or oral  lesions. There are no palpable cervical or supraclavicular this. Lungs are clear bilateral. Cardiac exam regular rate and rhythm with no murmurs rubs or bruits. Abdomen is soft. 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 scar. She has no hepatomegaly. There is no fluid wave. Back exam shows no tenderness over spine ribs or hips. Extremities shows no clubbing cyanosis or edema. Skin exam no rashes, ecchymoses or petechia. Neurological exam is nonfocal. Lab Results  Component Value Date   WBC 8.7 06/03/2015   HGB 15.4 06/03/2015   HCT 44.5 06/03/2015   MCV 94 06/03/2015   PLT 295 06/03/2015     Chemistry      Component Value Date/Time   NA 141 06/03/2015 0954   NA 141 01/04/2015 0925   NA 144 08/26/2014 1408   K 4.2 06/03/2015 0954   K 4.2 01/04/2015 0925   K 4.0 08/26/2014 1408   CL 104 01/04/2015 0925   CL 101 08/26/2014 1408   CO2 28 06/03/2015 0954   CO2 28 01/04/2015 0925   CO2 32 08/26/2014 1408   BUN 17.1 06/03/2015 0954   BUN 13 01/04/2015 0925   BUN 16 08/26/2014 1408   CREATININE 0.9 06/03/2015 0954   CREATININE 0.79 01/04/2015 0925   CREATININE 0.9 08/26/2014 1408      Component Value Date/Time   CALCIUM 9.7 06/03/2015 0954   CALCIUM 9.6 01/04/2015 0925   CALCIUM 9.5 08/26/2014 1408   ALKPHOS 80 06/03/2015 0954   ALKPHOS 74 01/04/2015 0925   ALKPHOS 69 08/26/2014 1408   AST 26 06/03/2015 0954   AST 29 01/04/2015 0925   AST 30 08/26/2014 1408   ALT 24 06/03/2015 0954   ALT 27 01/04/2015 0925   ALT 28 08/26/2014 1408   BILITOT 1.22* 06/03/2015 0954   BILITOT 1.7* 01/04/2015 0925   BILITOT 1.50 08/26/2014 1408      INR is 3.7 Impression and Plan: Ms. Awwad is 70 year old white female with the anti-phospholipid antibody syndrome. This really has not been a problem for her. The bigger issue has been the hepatic problems and her diabetes. She has steatosis. She has NASH.  We will decrease her dose of Coumadin data 7.5 mg a day.  She will  come back to have this rechecked in one month.  I am happy that her back is doing a little bit better. Hopefully, this stem cell injection will help it out.  I will plan to see her back in about 2 months.  [name], MD 2/16/20175:27 PM

## 2015-06-04 ENCOUNTER — Telehealth: Payer: Self-pay | Admitting: *Deleted

## 2015-06-04 NOTE — Telephone Encounter (Addendum)
Patient aware of results  ----- Message from [name], MD sent at 06/03/2015  5:16 PM EST ----- Call - liver tests are ok!!  Blood sugar is too high!!  Laurie Green

## 2015-06-21 ENCOUNTER — Other Ambulatory Visit: Payer: Self-pay | Admitting: *Deleted

## 2015-06-21 DIAGNOSIS — I82409 Acute embolism and thrombosis of unspecified deep veins of unspecified lower extremity: Secondary | ICD-10-CM

## 2015-06-21 MED ORDER — WARFARIN SODIUM 7.5 MG PO TABS: 7.5000 mg | ORAL_TABLET | Freq: Every day | ORAL | Status: DC

## 2015-06-24 ENCOUNTER — Other Ambulatory Visit: Payer: Self-pay | Admitting: *Deleted

## 2015-06-24 DIAGNOSIS — I82409 Acute embolism and thrombosis of unspecified deep veins of unspecified lower extremity: Secondary | ICD-10-CM

## 2015-06-24 MED ORDER — WARFARIN SODIUM 7.5 MG PO TABS: 7.5000 mg | ORAL_TABLET | Freq: Every day | ORAL | Status: DC

## 2015-07-02 ENCOUNTER — Other Ambulatory Visit (HOSPITAL_BASED_OUTPATIENT_CLINIC_OR_DEPARTMENT_OTHER): Payer: Medicare Other

## 2015-07-02 DIAGNOSIS — K7581 Nonalcoholic steatohepatitis (NASH): Secondary | ICD-10-CM

## 2015-07-02 DIAGNOSIS — I82409 Acute embolism and thrombosis of unspecified deep veins of unspecified lower extremity: Secondary | ICD-10-CM

## 2015-07-02 DIAGNOSIS — D6861 Antiphospholipid syndrome: Secondary | ICD-10-CM

## 2015-07-02 LAB — COMPREHENSIVE METABOLIC PANEL
ALT: 42 U/L (ref 0–55)
AST: 44 U/L — ABNORMAL HIGH (ref 5–34)
Albumin: 3.6 g/dL (ref 3.5–5.0)
Alkaline Phosphatase: 84 U/L (ref 40–150)
Anion Gap: 9 mEq/L (ref 3–11)
BUN: 21.1 mg/dL (ref 7.0–26.0)
CO2: 29 mEq/L (ref 22–29)
Calcium: 9.5 mg/dL (ref 8.4–10.4)
Chloride: 104 mEq/L (ref 98–109)
Creatinine: 1 mg/dL (ref 0.6–1.1)
EGFR: 60 mL/min/{1.73_m2} — ABNORMAL LOW (ref >90)
Glucose: 239 mg/dl — ABNORMAL HIGH (ref 70–140)
Potassium: 4.2 mEq/L (ref 3.5–5.1)
Sodium: 141 mEq/L (ref 136–145)
Total Bilirubin: 1.09 mg/dL (ref 0.20–1.20)
Total Protein: 6.9 g/dL (ref 6.4–8.3)

## 2015-07-02 LAB — CBC WITH DIFFERENTIAL (CANCER CENTER ONLY)
BASO#: 0 10*3/uL (ref 0.0–0.2)
BASO%: 0.6 % (ref 0.0–2.0)
EOS%: 2.7 % (ref 0.0–7.0)
Eosinophils Absolute: 0.2 10*3/uL (ref 0.0–0.5)
HCT: 40.8 % (ref 34.8–46.6)
HGB: 14.3 g/dL (ref 11.6–15.9)
LYMPH#: 1.7 10*3/uL (ref 0.9–3.3)
LYMPH%: 27.8 % (ref 14.0–48.0)
MCH: 33.4 pg (ref 26.0–34.0)
MCHC: 35 g/dL (ref 32.0–36.0)
MCV: 95 fL (ref 81–101)
MONO#: 0.7 10*3/uL (ref 0.1–0.9)
MONO%: 10.4 % (ref 0.0–13.0)
NEUT#: 3.7 10*3/uL (ref 1.5–6.5)
NEUT%: 58.5 % (ref 39.6–80.0)
Platelets: 276 10*3/uL (ref 145–400)
RBC: 4.28 10*6/uL (ref 3.70–5.32)
RDW: 13 % (ref 11.1–15.7)
WBC: 6.3 10*3/uL (ref 3.9–10.0)

## 2015-07-02 LAB — PROTIME-INR (CHCC SATELLITE)
INR: 2.1 (ref 2.0–3.5)
Protime: 25.2 Seconds — ABNORMAL HIGH (ref 10.6–13.4)

## 2015-07-05 ENCOUNTER — Other Ambulatory Visit: Payer: Medicare Other

## 2015-07-05 ENCOUNTER — Telehealth: Payer: Self-pay | Admitting: *Deleted

## 2015-07-05 NOTE — Telephone Encounter (Signed)
Spoke to patient about her labwork.  Told her that her blood sugar was high.  She is aware of this and said she had just eaten when she came that day.  Results routed to [name] MD

## 2015-07-05 NOTE — Telephone Encounter (Signed)
-----   Message from [name], MD sent at 07/05/2015  7:27 AM EDT ----- Call - INR is ok!!  Blood sugar is pretty high!!! If this is not controlled better, then your liver will begin to have problems!! You MUST see your family MD and gt the diabetes under better control!!  I will pray about this!!  Laurie Green

## 2015-08-02 ENCOUNTER — Other Ambulatory Visit (HOSPITAL_BASED_OUTPATIENT_CLINIC_OR_DEPARTMENT_OTHER): Payer: Medicare Other

## 2015-08-02 ENCOUNTER — Encounter: Payer: Self-pay | Admitting: Hematology & Oncology

## 2015-08-02 ENCOUNTER — Other Ambulatory Visit: Payer: Self-pay | Admitting: Family

## 2015-08-02 ENCOUNTER — Ambulatory Visit (HOSPITAL_BASED_OUTPATIENT_CLINIC_OR_DEPARTMENT_OTHER): Payer: Medicare Other | Admitting: Hematology & Oncology

## 2015-08-02 ENCOUNTER — Other Ambulatory Visit: Payer: Self-pay | Admitting: *Deleted

## 2015-08-02 VITALS — BP 126/63 | HR 54 | Temp 97.7°F | Resp 16 | Ht 64.0 in | Wt 148.0 lb

## 2015-08-02 DIAGNOSIS — E119 Type 2 diabetes mellitus without complications: Secondary | ICD-10-CM

## 2015-08-02 DIAGNOSIS — I82409 Acute embolism and thrombosis of unspecified deep veins of unspecified lower extremity: Secondary | ICD-10-CM

## 2015-08-02 DIAGNOSIS — D6861 Antiphospholipid syndrome: Secondary | ICD-10-CM

## 2015-08-02 DIAGNOSIS — M545 Low back pain: Secondary | ICD-10-CM | POA: Diagnosis not present

## 2015-08-02 DIAGNOSIS — K7581 Nonalcoholic steatohepatitis (NASH): Secondary | ICD-10-CM

## 2015-08-02 DIAGNOSIS — M5442 Lumbago with sciatica, left side: Secondary | ICD-10-CM

## 2015-08-02 LAB — COMPREHENSIVE METABOLIC PANEL
ALT: 25 U/L (ref 0–55)
AST: 28 U/L (ref 5–34)
Albumin: 3.4 g/dL — ABNORMAL LOW (ref 3.5–5.0)
Alkaline Phosphatase: 69 U/L (ref 40–150)
Anion Gap: 6 mEq/L (ref 3–11)
BUN: 18.1 mg/dL (ref 7.0–26.0)
CO2: 29 mEq/L (ref 22–29)
Calcium: 9.2 mg/dL (ref 8.4–10.4)
Chloride: 105 mEq/L (ref 98–109)
Creatinine: 0.8 mg/dL (ref 0.6–1.1)
EGFR: 74 mL/min/{1.73_m2} — ABNORMAL LOW (ref >90)
Glucose: 196 mg/dl — ABNORMAL HIGH (ref 70–140)
Potassium: 4 mEq/L (ref 3.5–5.1)
Sodium: 140 mEq/L (ref 136–145)
Total Bilirubin: 1.4 mg/dL — ABNORMAL HIGH (ref 0.20–1.20)
Total Protein: 6.6 g/dL (ref 6.4–8.3)

## 2015-08-02 LAB — CBC WITH DIFFERENTIAL/PLATELET
BASO%: 1.1 % (ref 0.0–2.0)
Basophils Absolute: 0.1 10*3/uL (ref 0.0–0.1)
EOS%: 5.1 % (ref 0.0–7.0)
Eosinophils Absolute: 0.3 10*3/uL (ref 0.0–0.5)
HCT: 41.1 % (ref 34.8–46.6)
HGB: 14.5 g/dL (ref 11.6–15.9)
LYMPH%: 28.6 % (ref 14.0–49.7)
MCH: 34.1 pg — ABNORMAL HIGH (ref 25.1–34.0)
MCHC: 35.3 g/dL (ref 31.5–36.0)
MCV: 96.7 fL (ref 79.5–101.0)
MONO#: 0.8 10*3/uL (ref 0.1–0.9)
MONO%: 12.7 % (ref 0.0–14.0)
NEUT#: 3.3 10*3/uL (ref 1.5–6.5)
NEUT%: 52.5 % (ref 38.4–76.8)
Platelets: 287 10*3/uL (ref 145–400)
RBC: 4.25 10*6/uL (ref 3.70–5.45)
RDW: 13.1 % (ref 11.2–14.5)
WBC: 6.2 10*3/uL (ref 3.9–10.3)
lymph#: 1.8 10*3/uL (ref 0.9–3.3)
nRBC: 0 % (ref 0–0)

## 2015-08-02 LAB — PROTIME-INR
INR: 2.9 (ref 2.00–3.50)
Protime: 34.8 Seconds — ABNORMAL HIGH (ref 10.6–13.4)

## 2015-08-02 NOTE — Progress Notes (Signed)
Hematology and Oncology Follow Up Visit  MIAKODA MCMILLION [id number] 1945/08/22 70 y.o. 08/02/2015   Principle Diagnosis:  1. Antiphospholipid antibody syndrome. 2. History of recurrent lower extremity deep venous thrombosis. 3. Nonalcoholic steatohepatitis.  Current Therapy:   Coumadin to maintain an INR between 2-3.     Interim History:  Ms.  Siler is back for followup. Her lower back is causing her whole lot of problems. She's having pain in the left leg. She really needs have an MRI done. I will go ahead and get this set up. She said that she has seen orthopedic surgery. She is not too happy with what they recommended.  She has had no problems with bleeding. She's had no issues with weakness. She's had no problems with bowels or bladder. She says that she thinks that she may pass gallstones recently.  She's had no fever. There has not been any cough.  She's had no leg swelling. She is doing well on the Coumadin. She's had no problems with Coumadin.  So far, her Karlene Lineman has not caused her any problems.  Her performance status is ECOG 1.  Medications:  Current outpatient prescriptions:  .  benazepril (LOTENSIN) 20 MG tablet, Take 20 mg by mouth every morning. , Disp: , Rfl:  .  Cholecalciferol (VITAMIN D3) 2000 UNITS capsule, Take 2,000 Units by mouth daily., Disp: , Rfl:  .  CINNAMON PO, Take 2 tablets by mouth daily., Disp: , Rfl:  .  glucose 4 GM chewable tablet, Chew 16 g by mouth as needed for low blood sugar., Disp: , Rfl:  .  metoCLOPramide (REGLAN) 5 MG tablet, Take 5 mg by mouth every 6 (six) hours as needed for nausea., Disp: , Rfl:  .  ondansetron (ZOFRAN-ODT) 4 MG disintegrating tablet, Take 1 tablet (4 mg total) by mouth every 8 (eight) hours as needed for nausea or vomiting., Disp: 20 tablet, Rfl: 0 .  OVER THE COUNTER MEDICATION, Take 3 tablets by mouth 2 (two) times daily., Disp: , Rfl:  .  oxyCODONE-acetaminophen (PERCOCET/ROXICET) 5-325 MG tablet, Take 1 tablet  by mouth every 6 (six) hours as needed for severe pain., Disp: 15 tablet, Rfl: 0 .  PREMARIN vaginal cream, Place 1 Applicatorful vaginally every Monday, Wednesday, and Friday. , Disp: , Rfl: 3 .  propranolol (INDERAL) 80 MG tablet, Take 80 mg by mouth every morning. , Disp: , Rfl:  .  warfarin (COUMADIN) 7.5 MG tablet, Take 1 tablet (7.5 mg total) by mouth daily. 90 day supply., Disp: 90 tablet, Rfl: 3 .  vitamin B-12 (CYANOCOBALAMIN) 1000 MCG tablet, Take 1,000 mcg by mouth daily., Disp: , Rfl:   Allergies:  Allergies  Allergen Reactions  . Caine-1 [Lidocaine Hcl] Anaphylaxis  . Morphine Anaphylaxis  . Zosyn [Piperacillin Sod-Tazobactam So] Itching    "scalp itch"-was given Benadryl to counteract.  . Codeine Nausea And Vomiting and Other (See Comments)  . Dilaudid [Hydromorphone Hcl] Nausea And Vomiting    Can take with zofran  . Morphine And Related Nausea And Vomiting  . Percocet [Oxycodone-Acetaminophen] Nausea And Vomiting  . Tramadol Nausea And Vomiting  . Vicodin [Hydrocodone-Acetaminophen] Nausea And Vomiting    Past Medical History, Surgical history, Social history, and Family History were reviewed and updated.  Review of Systems: As above  Physical Exam:  height is 5' 4"  (1.626 m) and weight is 148 lb (67.132 kg). Her oral temperature is 97.7 F (36.5 C). Her blood pressure is 126/63 and her pulse is 54. Her respiration  is 48.   Well-developed and well-nourished white female in no obvious distress. Head and neck exam shows no ocular or oral lesions. There are no palpable cervical or supraclavicular this. Lungs are clear bilateral. Cardiac exam regular rate and rhythm with no murmurs rubs or bruits. Abdomen is soft. 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 scar. She has no hepatomegaly. There is no fluid wave. Back exam shows no tenderness over spine ribs or hips. Extremities shows no clubbing cyanosis or edema. Skin exam no rashes, ecchymoses or petechia.  Neurological exam is nonfocal. Lab Results  Component Value Date   WBC 6.2 08/02/2015   HGB 14.5 08/02/2015   HCT 41.1 08/02/2015   MCV 96.7 08/02/2015   PLT 287 08/02/2015     Chemistry      Component Value Date/Time   NA 141 07/02/2015 1320   NA 141 01/04/2015 0925   NA 144 08/26/2014 1408   K 4.2 07/02/2015 1320   K 4.2 01/04/2015 0925   K 4.0 08/26/2014 1408   CL 104 01/04/2015 0925   CL 101 08/26/2014 1408   CO2 29 07/02/2015 1320   CO2 28 01/04/2015 0925   CO2 32 08/26/2014 1408   BUN 21.1 07/02/2015 1320   BUN 13 01/04/2015 0925   BUN 16 08/26/2014 1408   CREATININE 1.0 07/02/2015 1320   CREATININE 0.79 01/04/2015 0925   CREATININE 0.9 08/26/2014 1408      Component Value Date/Time   CALCIUM 9.5 07/02/2015 1320   CALCIUM 9.6 01/04/2015 0925   CALCIUM 9.5 08/26/2014 1408   ALKPHOS 84 07/02/2015 1320   ALKPHOS 74 01/04/2015 0925   ALKPHOS 69 08/26/2014 1408   AST 44* 07/02/2015 1320   AST 29 01/04/2015 0925   AST 30 08/26/2014 1408   ALT 42 07/02/2015 1320   ALT 27 01/04/2015 0925   ALT 28 08/26/2014 1408   BILITOT 1.09 07/02/2015 1320   BILITOT 1.7* 01/04/2015 0925   BILITOT 1.50 08/26/2014 1408      INR is 2.9 Impression and Plan: Ms. Laureano is 70 year old white female with the anti-phospholipid antibody syndrome. This really has not been a problem for her. The bigger issue has been the hepatic problems and her diabetes. She has steatosis. She has NASH.  Right now, her lower back she is being her biggest problem. I feel bad that she spends so much money on these stem cell injections with out any benefit.  I think MRI is needed. I just not sure why she has not had this before. I will set up an MRI for her. It sounds like she spine on the some kind of surgical intervention which in my mind would be safe despite her anticoagulation.   We will plan to get her back in 1 month because of all the issues going on.   I spent about 45 minutes with her today.  She will tell me something in private about her personal life which I am not a liberty to divulge.  [name], MD 4/17/201711:37 AM

## 2015-08-03 ENCOUNTER — Telehealth: Payer: Self-pay

## 2015-08-03 NOTE — Telephone Encounter (Addendum)
-----   Message from [name], MD sent at 08/02/2015  6:02 PM EDT ----- Call - blood sugar is still pretty high!!  Be careful!!!  [name]  This message provided to pt via phone. Pt reports "I just ate a banana when they took that." Pt reports she is monitoring glucose at home and "it was good this morning." Aware to continue to monitor per Dr Ginette Pitman. dph

## 2015-08-13 ENCOUNTER — Ambulatory Visit (HOSPITAL_COMMUNITY): Admission: RE | Admit: 2015-08-13 | Payer: Medicare Other | Source: Ambulatory Visit

## 2015-08-19 ENCOUNTER — Other Ambulatory Visit: Payer: Self-pay | Admitting: *Deleted

## 2015-08-19 DIAGNOSIS — I82403 Acute embolism and thrombosis of unspecified deep veins of lower extremity, bilateral: Secondary | ICD-10-CM

## 2015-08-19 MED ORDER — DIAZEPAM 5 MG PO TABS: 5.0000 mg | ORAL_TABLET | Freq: Once | ORAL | Status: DC

## 2015-08-28 ENCOUNTER — Ambulatory Visit (HOSPITAL_BASED_OUTPATIENT_CLINIC_OR_DEPARTMENT_OTHER)
Admission: RE | Admit: 2015-08-28 | Discharge: 2015-08-28 | Disposition: A | Discharge status: Home / Self Care | Payer: Medicare Other | Source: Ambulatory Visit | Attending: Family | Admitting: Family

## 2015-08-28 DIAGNOSIS — M5442 Lumbago with sciatica, left side: Secondary | ICD-10-CM | POA: Insufficient documentation

## 2015-08-28 DIAGNOSIS — D6861 Antiphospholipid syndrome: Secondary | ICD-10-CM | POA: Diagnosis not present

## 2015-08-28 DIAGNOSIS — R600 Localized edema: Secondary | ICD-10-CM | POA: Insufficient documentation

## 2015-08-28 DIAGNOSIS — M5126 Other intervertebral disc displacement, lumbar region: Secondary | ICD-10-CM | POA: Insufficient documentation

## 2015-08-28 DIAGNOSIS — M4806 Spinal stenosis, lumbar region: Secondary | ICD-10-CM | POA: Insufficient documentation

## 2015-08-28 DIAGNOSIS — M47896 Other spondylosis, lumbar region: Secondary | ICD-10-CM | POA: Insufficient documentation

## 2015-08-28 DIAGNOSIS — M545 Low back pain: Secondary | ICD-10-CM | POA: Diagnosis present

## 2015-08-28 DIAGNOSIS — M1288 Other specific arthropathies, not elsewhere classified, other specified site: Secondary | ICD-10-CM | POA: Insufficient documentation

## 2015-08-28 MED ORDER — GADOBENATE DIMEGLUMINE 529 MG/ML IV SOLN
13.0000 mL | Freq: Once | INTRAVENOUS | Status: AC | PRN
  Administered 2015-08-28: 13 mL via INTRAVENOUS

## 2015-08-30 ENCOUNTER — Other Ambulatory Visit: Payer: Self-pay | Admitting: Family

## 2015-08-30 ENCOUNTER — Telehealth: Payer: Self-pay | Admitting: Family

## 2015-08-30 DIAGNOSIS — M5136 Other intervertebral disc degeneration, lumbar region: Secondary | ICD-10-CM

## 2015-08-30 NOTE — Telephone Encounter (Signed)
I spoke with Laurie Green over the phone and went through her MR scan results with her. I also let her know that we have referred her to Dr. Lynann Bologna for further evaluation and treatment. She is in agreement with the plan.

## 2015-09-01 ENCOUNTER — Other Ambulatory Visit (HOSPITAL_BASED_OUTPATIENT_CLINIC_OR_DEPARTMENT_OTHER): Payer: Medicare Other

## 2015-09-01 ENCOUNTER — Ambulatory Visit (HOSPITAL_BASED_OUTPATIENT_CLINIC_OR_DEPARTMENT_OTHER): Payer: Medicare Other | Admitting: Hematology & Oncology

## 2015-09-01 ENCOUNTER — Encounter: Payer: Self-pay | Admitting: Hematology & Oncology

## 2015-09-01 VITALS — BP 141/70 | HR 58 | Temp 98.2°F | Resp 16 | Ht 64.0 in | Wt 148.0 lb

## 2015-09-01 DIAGNOSIS — M545 Low back pain: Secondary | ICD-10-CM

## 2015-09-01 DIAGNOSIS — I82409 Acute embolism and thrombosis of unspecified deep veins of unspecified lower extremity: Secondary | ICD-10-CM

## 2015-09-01 DIAGNOSIS — D6861 Antiphospholipid syndrome: Secondary | ICD-10-CM

## 2015-09-01 DIAGNOSIS — Z8679 Personal history of other diseases of the circulatory system: Secondary | ICD-10-CM

## 2015-09-01 DIAGNOSIS — E119 Type 2 diabetes mellitus without complications: Secondary | ICD-10-CM | POA: Diagnosis not present

## 2015-09-01 DIAGNOSIS — K7581 Nonalcoholic steatohepatitis (NASH): Secondary | ICD-10-CM | POA: Diagnosis not present

## 2015-09-01 DIAGNOSIS — M5442 Lumbago with sciatica, left side: Secondary | ICD-10-CM

## 2015-09-01 LAB — CBC WITH DIFFERENTIAL (CANCER CENTER ONLY)
BASO#: 0.1 10*3/uL (ref 0.0–0.2)
BASO%: 0.7 % (ref 0.0–2.0)
EOS%: 4.3 % (ref 0.0–7.0)
Eosinophils Absolute: 0.3 10*3/uL (ref 0.0–0.5)
HCT: 42.1 % (ref 34.8–46.6)
HGB: 14.8 g/dL (ref 11.6–15.9)
LYMPH#: 1.8 10*3/uL (ref 0.9–3.3)
LYMPH%: 26.3 % (ref 14.0–48.0)
MCH: 33.9 pg (ref 26.0–34.0)
MCHC: 35.2 g/dL (ref 32.0–36.0)
MCV: 96 fL (ref 81–101)
MONO#: 0.7 10*3/uL (ref 0.1–0.9)
MONO%: 10.3 % (ref 0.0–13.0)
NEUT#: 4 10*3/uL (ref 1.5–6.5)
NEUT%: 58.4 % (ref 39.6–80.0)
Platelets: 272 10*3/uL (ref 145–400)
RBC: 4.37 10*6/uL (ref 3.70–5.32)
RDW: 13.1 % (ref 11.1–15.7)
WBC: 6.9 10*3/uL (ref 3.9–10.0)

## 2015-09-01 LAB — COMPREHENSIVE METABOLIC PANEL
ALT: 27 U/L (ref 0–55)
AST: 25 U/L (ref 5–34)
Albumin: 3.6 g/dL (ref 3.5–5.0)
Alkaline Phosphatase: 69 U/L (ref 40–150)
Anion Gap: 7 mEq/L (ref 3–11)
BUN: 17.1 mg/dL (ref 7.0–26.0)
CO2: 29 mEq/L (ref 22–29)
Calcium: 9.5 mg/dL (ref 8.4–10.4)
Chloride: 105 mEq/L (ref 98–109)
Creatinine: 0.8 mg/dL (ref 0.6–1.1)
EGFR: 73 mL/min/{1.73_m2} — ABNORMAL LOW (ref >90)
Glucose: 192 mg/dl — ABNORMAL HIGH (ref 70–140)
Potassium: 4 mEq/L (ref 3.5–5.1)
Sodium: 141 mEq/L (ref 136–145)
Total Bilirubin: 1.16 mg/dL (ref 0.20–1.20)
Total Protein: 6.8 g/dL (ref 6.4–8.3)

## 2015-09-01 LAB — PROTIME-INR (CHCC SATELLITE)
INR: 2.4 (ref 2.0–3.5)
Protime: 28.8 Seconds — ABNORMAL HIGH (ref 10.6–13.4)

## 2015-09-01 NOTE — Progress Notes (Signed)
Hematology and Oncology Follow Up Visit  Laurie Green [id number] [birth year] 70 y.o. 09/01/2015   Principle Diagnosis:  1. Antiphospholipid antibody syndrome. 2. History of recurrent lower extremity deep venous thrombosis. 3. Nonalcoholic steatohepatitis.  Current Therapy:   Coumadin to maintain an INR between 2-3.     Interim History:  [name] is back for followup. Her lower back is causing her whole lot of problems. She's having pain in the left leg. She had an MRI. 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  She sees Dr. Lynann Bologna [facility]. I'm sure he will be able to help her out.  She has had no problems with bleeding. She's had no issues with weakness. She's had no problems with bowels or bladder. She says that she thinks that she may pass gallstones recently.  She recently had an episode of cardiac fluttering. She pontes have an echocardiogram done.  She's had no fever. There has not been any cough.  She's had no leg swelling. She is doing well on the Coumadin. She's had no problems with Coumadin.  So far, her NASH has not caused her any problems.  Her performance status is ECOG 1.  Medications:  Current outpatient prescriptions:  .  benazepril (LOTENSIN) 20 MG tablet, Take 20 mg by mouth every morning. , Disp: , Rfl:  .  Cholecalciferol (VITAMIN D3) 2000 UNITS capsule, Take 2,000 Units by mouth daily., Disp: , Rfl:  .  CINNAMON PO, Take 2 tablets by mouth daily., Disp: , Rfl:  .  glucose 4 GM chewable tablet, Chew 16 g by mouth as needed for low blood sugar., Disp: , Rfl:  .  metoCLOPramide (REGLAN) 5 MG tablet, Take 5 mg by mouth every 6 (six) hours as needed for nausea., Disp: , Rfl:  .  PREMARIN vaginal cream, Place 1 Applicatorful vaginally every Monday, Wednesday, and Friday. , Disp: , Rfl: 3 .  propranolol (INDERAL) 80 MG tablet, Take 80 mg by mouth every morning. , Disp: , Rfl:  .  vitamin B-12  (CYANOCOBALAMIN) 1000 MCG tablet, Take 1,000 mcg by mouth daily., Disp: , Rfl:  .  warfarin (COUMADIN) 7.5 MG tablet, Take 1 tablet (7.5 mg total) by mouth daily. 90 day supply., Disp: 90 tablet, Rfl: 3 .  OVER THE COUNTER MEDICATION, Take 3 tablets by mouth 2 (two) times daily., Disp: , Rfl:   Allergies:  Allergies  Allergen Reactions  . Caine-1 [Lidocaine Hcl] Anaphylaxis  . Morphine Anaphylaxis  . Zosyn [Piperacillin Sod-Tazobactam So] Itching    "scalp itch"-was given Benadryl to counteract.  . Codeine Nausea And Vomiting and Other (See Comments)  . Dilaudid [Hydromorphone Hcl] Nausea And Vomiting    Can take with zofran  . Morphine And Related Nausea And Vomiting  . Percocet [Oxycodone-Acetaminophen] Nausea And Vomiting  . Tramadol Nausea And Vomiting  . Vicodin [Hydrocodone-Acetaminophen] Nausea And Vomiting    Past Medical History, Surgical history, Social history, and Family History were reviewed and updated.  Review of Systems: As above  Physical Exam:  height is 5' 4"  (1.626 m) and weight is 148 lb (67.132 kg). Her oral temperature is 98.2 F (36.8 C). Her blood pressure is 141/70 and her pulse is 58. Her respiration is 16.   Well-developed and well-nourished white female in no obvious distress. Head and neck exam shows no ocular or oral lesions. There are no palpable cervical or supraclavicular this. Lungs are clear bilateral. Cardiac exam regular rate  and rhythm with no murmurs rubs or bruits. Abdomen is soft. 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 scar. She has no hepatomegaly. There is no fluid wave. Back exam shows no tenderness over spine ribs or hips. Extremities shows no clubbing cyanosis or edema. Skin exam no rashes, ecchymoses or petechia. Neurological exam is nonfocal. Lab Results  Component Value Date   WBC 6.9 09/01/2015   HGB 14.8 09/01/2015   HCT 42.1 09/01/2015   MCV 96 09/01/2015   PLT 272 09/01/2015     Chemistry      Component  Value Date/Time   NA 140 08/02/2015 1009   NA 141 01/04/2015 0925   NA 144 08/26/2014 1408   K 4.0 08/02/2015 1009   K 4.2 01/04/2015 0925   K 4.0 08/26/2014 1408   CL 104 01/04/2015 0925   CL 101 08/26/2014 1408   CO2 29 08/02/2015 1009   CO2 28 01/04/2015 0925   CO2 32 08/26/2014 1408   BUN 18.1 08/02/2015 1009   BUN 13 01/04/2015 0925   BUN 16 08/26/2014 1408   CREATININE 0.8 08/02/2015 1009   CREATININE 0.79 01/04/2015 0925   CREATININE 0.9 08/26/2014 1408      Component Value Date/Time   CALCIUM 9.2 08/02/2015 1009   CALCIUM 9.6 01/04/2015 0925   CALCIUM 9.5 08/26/2014 1408   ALKPHOS 69 08/02/2015 1009   ALKPHOS 74 01/04/2015 0925   ALKPHOS 69 08/26/2014 1408   AST 28 08/02/2015 1009   AST 29 01/04/2015 0925   AST 30 08/26/2014 1408   ALT 25 08/02/2015 1009   ALT 27 01/04/2015 0925   ALT 28 08/26/2014 1408   BILITOT 1.40* 08/02/2015 1009   BILITOT 1.7* 01/04/2015 0925   BILITOT 1.50 08/26/2014 1408      INR is 2.4 Impression and Plan: Laurie Green is 70 year old white female with the anti-phospholipid antibody syndrome. This really has not been a problem for her. The bigger issue has been the hepatic problems and her diabetes. She has steatosis. She has NASH.  Right now, her lower back she is being her biggest problem.  She will see the back surgeon on Monday. I know that he will help her out. If she needs any kind of injections or even a procedure, we can certainly work with her Coumadin and get her onto Lovenox.  I'm not sure as to why she had this fluttering. I suppose that she might be at risk for mitral valve prolapse. We will go ahead and get her set up an echocardiogram to take a look at this.   We will go ahead and plan to get her back now in 2 months. I think from a hematologic point of view, she is stable. All of her issues really are not blood related.[name], MD 5/17/201712:40 PM

## 2015-09-02 ENCOUNTER — Ambulatory Visit (HOSPITAL_BASED_OUTPATIENT_CLINIC_OR_DEPARTMENT_OTHER)
Admission: RE | Admit: 2015-09-02 | Discharge: 2015-09-02 | Disposition: A | Discharge status: Home / Self Care | Payer: Medicare Other | Source: Ambulatory Visit | Attending: Hematology & Oncology | Admitting: Hematology & Oncology

## 2015-09-02 DIAGNOSIS — K7581 Nonalcoholic steatohepatitis (NASH): Secondary | ICD-10-CM | POA: Diagnosis not present

## 2015-09-02 DIAGNOSIS — I82409 Acute embolism and thrombosis of unspecified deep veins of unspecified lower extremity: Secondary | ICD-10-CM | POA: Diagnosis not present

## 2015-09-02 NOTE — Progress Notes (Signed)
  Echocardiogram 2D Echocardiogram has been performed.  Jennette Dubin 09/02/2015, 1:43 PM

## 2015-09-03 ENCOUNTER — Encounter: Payer: Self-pay | Admitting: *Deleted

## 2015-09-29 ENCOUNTER — Other Ambulatory Visit (HOSPITAL_BASED_OUTPATIENT_CLINIC_OR_DEPARTMENT_OTHER): Payer: Medicare Other

## 2015-09-29 DIAGNOSIS — D6861 Antiphospholipid syndrome: Secondary | ICD-10-CM

## 2015-09-29 DIAGNOSIS — I82409 Acute embolism and thrombosis of unspecified deep veins of unspecified lower extremity: Secondary | ICD-10-CM

## 2015-09-29 LAB — PROTIME-INR (CHCC SATELLITE)
INR: 1.8 — ABNORMAL LOW (ref 2.0–3.5)
Protime: 21.6 Seconds — ABNORMAL HIGH (ref 10.6–13.4)

## 2015-11-03 ENCOUNTER — Ambulatory Visit (HOSPITAL_BASED_OUTPATIENT_CLINIC_OR_DEPARTMENT_OTHER): Payer: Medicare Other | Admitting: Hematology & Oncology

## 2015-11-03 ENCOUNTER — Encounter: Payer: Self-pay | Admitting: Hematology & Oncology

## 2015-11-03 ENCOUNTER — Other Ambulatory Visit (HOSPITAL_BASED_OUTPATIENT_CLINIC_OR_DEPARTMENT_OTHER): Payer: Medicare Other

## 2015-11-03 VITALS — BP 154/66 | HR 57 | Temp 97.8°F | Resp 16 | Ht 64.0 in | Wt 148.0 lb

## 2015-11-03 DIAGNOSIS — M545 Low back pain: Secondary | ICD-10-CM

## 2015-11-03 DIAGNOSIS — I82402 Acute embolism and thrombosis of unspecified deep veins of left lower extremity: Secondary | ICD-10-CM

## 2015-11-03 DIAGNOSIS — K7581 Nonalcoholic steatohepatitis (NASH): Secondary | ICD-10-CM

## 2015-11-03 DIAGNOSIS — E119 Type 2 diabetes mellitus without complications: Secondary | ICD-10-CM

## 2015-11-03 DIAGNOSIS — D6861 Antiphospholipid syndrome: Secondary | ICD-10-CM | POA: Diagnosis not present

## 2015-11-03 DIAGNOSIS — I82409 Acute embolism and thrombosis of unspecified deep veins of unspecified lower extremity: Secondary | ICD-10-CM

## 2015-11-03 LAB — CBC WITH DIFFERENTIAL (CANCER CENTER ONLY)
BASO#: 0.1 10*3/uL (ref 0.0–0.2)
BASO%: 0.9 % (ref 0.0–2.0)
EOS%: 3.1 % (ref 0.0–7.0)
Eosinophils Absolute: 0.2 10*3/uL (ref 0.0–0.5)
HCT: 42.2 % (ref 34.8–46.6)
HGB: 15 g/dL (ref 11.6–15.9)
LYMPH#: 1.7 10*3/uL (ref 0.9–3.3)
LYMPH%: 29.1 % (ref 14.0–48.0)
MCH: 34.2 pg — ABNORMAL HIGH (ref 26.0–34.0)
MCHC: 35.5 g/dL (ref 32.0–36.0)
MCV: 96 fL (ref 81–101)
MONO#: 0.8 10*3/uL (ref 0.1–0.9)
MONO%: 13.9 % — ABNORMAL HIGH (ref 0.0–13.0)
NEUT#: 3.1 10*3/uL (ref 1.5–6.5)
NEUT%: 53 % (ref 39.6–80.0)
Platelets: 263 10*3/uL (ref 145–400)
RBC: 4.39 10*6/uL (ref 3.70–5.32)
RDW: 13.2 % (ref 11.1–15.7)
WBC: 5.9 10*3/uL (ref 3.9–10.0)

## 2015-11-03 LAB — COMPREHENSIVE METABOLIC PANEL
ALT: 29 U/L (ref 0–55)
AST: 28 U/L (ref 5–34)
Albumin: 3.6 g/dL (ref 3.5–5.0)
Alkaline Phosphatase: 74 U/L (ref 40–150)
Anion Gap: 9 mEq/L (ref 3–11)
BUN: 16.3 mg/dL (ref 7.0–26.0)
CO2: 28 mEq/L (ref 22–29)
Calcium: 9.3 mg/dL (ref 8.4–10.4)
Chloride: 106 mEq/L (ref 98–109)
Creatinine: 0.8 mg/dL (ref 0.6–1.1)
EGFR: 75 mL/min/{1.73_m2} — ABNORMAL LOW (ref >90)
Glucose: 158 mg/dl — ABNORMAL HIGH (ref 70–140)
Potassium: 4 mEq/L (ref 3.5–5.1)
Sodium: 143 mEq/L (ref 136–145)
Total Bilirubin: 1.85 mg/dL — ABNORMAL HIGH (ref 0.20–1.20)
Total Protein: 6.9 g/dL (ref 6.4–8.3)

## 2015-11-03 LAB — PROTIME-INR (CHCC SATELLITE)
INR: 2.2 (ref 2.0–3.5)
Protime: 26.4 Seconds — ABNORMAL HIGH (ref 10.6–13.4)

## 2015-11-03 NOTE — Progress Notes (Signed)
Hematology and Oncology Follow Up Visit  Laurie Green [id number] [birth year] 70 y.o. 11/03/2015   Principle Diagnosis:  1. Antiphospholipid antibody syndrome. 2. History of recurrent lower extremity deep venous thrombosis. 3. Nonalcoholic steatohepatitis.  Current Therapy:   Coumadin to maintain an INR between 2-3.     Interim History:  Ms.  Green is back for followup. Her lower back is still causing her whole lot of problems. She's having pain in the left leg.   She is going for an epidural injection to the back next week. Hopefully this will help her out.   She will stop the Coumadin 5 days before the procedure. Her INR today is 2.2. This should easily be out of her system come next Thursday. I told her to restart the Coumadin after she has her procedure done on Thursday.   Her blood sugars have been on the higher side. She's been on intermittent steroids to help with the back.   She has had no bleeding.   She does have the [place]. I will leave this has been a real problem for her.   Her performance status is ECOG 1.  Medications:  Current outpatient prescriptions:  .  benazepril (LOTENSIN) 20 MG tablet, Take 20 mg by mouth every morning. , Disp: , Rfl:  .  Cholecalciferol (VITAMIN D3) 2000 UNITS capsule, Take 2,000 Units by mouth daily., Disp: , Rfl:  .  CINNAMON PO, Take 2 tablets by mouth daily., Disp: , Rfl:  .  glipiZIDE (GLUCOTROL XL) 5 MG 24 hr tablet, , Disp: , Rfl: 1 .  glucose 4 GM chewable tablet, Chew 16 g by mouth as needed for low blood sugar., Disp: , Rfl:  .  metoCLOPramide (REGLAN) 5 MG tablet, Take 5 mg by mouth every 6 (six) hours as needed for nausea., Disp: , Rfl:  .  OVER THE COUNTER MEDICATION, Take 3 tablets by mouth 2 (two) times daily., Disp: , Rfl:  .  PREMARIN vaginal cream, Place 1 Applicatorful vaginally every Monday, Wednesday, and Friday. , Disp: , Rfl: 3 .  propranolol (INDERAL) 80 MG tablet, Take 80 mg by mouth every morning. , Disp: ,  Rfl:  .  vitamin B-12 (CYANOCOBALAMIN) 1000 MCG tablet, Take 1,000 mcg by mouth daily., Disp: , Rfl:  .  warfarin (COUMADIN) 7.5 MG tablet, Take 1 tablet (7.5 mg total) by mouth daily. 90 day supply., Disp: 90 tablet, Rfl: 3  Allergies:  Allergies  Allergen Reactions  . Caine-1 [Lidocaine Hcl] Anaphylaxis  . Morphine Anaphylaxis  . Zosyn [Piperacillin Sod-Tazobactam So] Itching    "scalp itch"-was given Benadryl to counteract.  . Codeine Nausea And Vomiting and Other (See Comments)  . Dilaudid [Hydromorphone Hcl] Nausea And Vomiting    Can take with zofran  . Morphine And Related Nausea And Vomiting  . Percocet [Oxycodone-Acetaminophen] Nausea And Vomiting  . Tramadol Nausea And Vomiting  . Vicodin [Hydrocodone-Acetaminophen] Nausea And Vomiting    Past Medical History, Surgical history, Social history, and Family History were reviewed and updated.  Review of Systems: As above  Physical Exam:  height is 5' 4"  (1.626 m) and weight is 148 lb (67.132 kg). Her oral temperature is 97.8 F (36.6 C). Her blood pressure is 154/66 and her pulse is 57. Her respiration is 16.   Well-developed and well-nourished white female in no obvious distress. Head and neck exam shows no ocular or oral lesions. There are no palpable cervical or supraclavicular this. Lungs are clear bilateral. Cardiac exam regular  rate and rhythm with no murmurs rubs or bruits. Abdomen is soft. 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 scar. She has no hepatomegaly. There is no fluid wave. Back exam shows no tenderness over spine ribs or hips. Extremities shows no clubbing cyanosis or edema. Skin exam no rashes, ecchymoses or petechia. Neurological exam is nonfocal. Lab Results  Component Value Date   WBC 5.9 11/03/2015   HGB 15.0 11/03/2015   HCT 42.2 11/03/2015   MCV 96 11/03/2015   PLT 263 11/03/2015     Chemistry      Component Value Date/Time   NA 141 09/01/2015 1110   NA 141 01/04/2015 0925     NA 144 08/26/2014 1408   K 4.0 09/01/2015 1110   K 4.2 01/04/2015 0925   K 4.0 08/26/2014 1408   CL 104 01/04/2015 0925   CL 101 08/26/2014 1408   CO2 29 09/01/2015 1110   CO2 28 01/04/2015 0925   CO2 32 08/26/2014 1408   BUN 17.1 09/01/2015 1110   BUN 13 01/04/2015 0925   BUN 16 08/26/2014 1408   CREATININE 0.8 09/01/2015 1110   CREATININE 0.79 01/04/2015 0925   CREATININE 0.9 08/26/2014 1408      Component Value Date/Time   CALCIUM 9.5 09/01/2015 1110   CALCIUM 9.6 01/04/2015 0925   CALCIUM 9.5 08/26/2014 1408   ALKPHOS 69 09/01/2015 1110   ALKPHOS 74 01/04/2015 0925   ALKPHOS 69 08/26/2014 1408   AST 25 09/01/2015 1110   AST 29 01/04/2015 0925   AST 30 08/26/2014 1408   ALT 27 09/01/2015 1110   ALT 27 01/04/2015 0925   ALT 28 08/26/2014 1408   BILITOT 1.16 09/01/2015 1110   BILITOT 1.7* 01/04/2015 0925   BILITOT 1.50 08/26/2014 1408      INR is 2.2 Impression and Plan: [name] is 70 year old white female with the anti-phospholipid antibody syndrome. This really has not been a problem for her. The bigger issue has been the hepatic problems and her diabetes. She has steatosis. She has NASH.  Right now, her lower back she is being her biggest problem. She will be going for an epidural injection next week. We'll stop the Coumadin 5 days before hand. I'll think that there will be any issues with her and bleeding.  I told her to restart the Coumadin the day she has injection. I will then follow-up with a lab test in one week to see a she's doing.  If this injection does not work, then she probably will go to [place] to the laser [employer]  She has had no issues with her blood sugars. I think they have been a little high side.  She has some liver pain. I'm a sure this really is anything that we have to worry about.  I will like to see her back in 6 weeks.   [name], MD 7/19/20171:30 PM

## 2015-11-18 ENCOUNTER — Other Ambulatory Visit (HOSPITAL_BASED_OUTPATIENT_CLINIC_OR_DEPARTMENT_OTHER): Payer: Medicare Other

## 2015-11-18 DIAGNOSIS — D6861 Antiphospholipid syndrome: Secondary | ICD-10-CM | POA: Diagnosis not present

## 2015-11-18 DIAGNOSIS — K7581 Nonalcoholic steatohepatitis (NASH): Secondary | ICD-10-CM

## 2015-11-18 DIAGNOSIS — I82402 Acute embolism and thrombosis of unspecified deep veins of left lower extremity: Secondary | ICD-10-CM

## 2015-11-18 LAB — PROTIME-INR (CHCC SATELLITE)
INR: 1.5 — ABNORMAL LOW (ref 2.0–3.5)
Protime: 18 Seconds — ABNORMAL HIGH (ref 10.6–13.4)

## 2015-11-19 ENCOUNTER — Other Ambulatory Visit: Payer: Self-pay

## 2015-11-22 ENCOUNTER — Other Ambulatory Visit: Payer: Medicare Other

## 2015-11-24 ENCOUNTER — Other Ambulatory Visit (HOSPITAL_BASED_OUTPATIENT_CLINIC_OR_DEPARTMENT_OTHER): Payer: Medicare Other

## 2015-11-24 ENCOUNTER — Telehealth: Payer: Self-pay

## 2015-11-24 DIAGNOSIS — D6861 Antiphospholipid syndrome: Secondary | ICD-10-CM

## 2015-11-24 DIAGNOSIS — I82409 Acute embolism and thrombosis of unspecified deep veins of unspecified lower extremity: Secondary | ICD-10-CM

## 2015-11-24 LAB — PROTIME-INR (CHCC SATELLITE)
INR: 2.1 (ref 2.0–3.5)
Protime: 25.2 Seconds — ABNORMAL HIGH (ref 10.6–13.4)

## 2015-11-24 NOTE — Telephone Encounter (Addendum)
Message left on personalized VM with instructions to contact our office to schedule lab for 10 days.   - ---- Message from [name], MD sent at 11/24/2015  5:22 PM EDT ----- Call - INR is better - 2.1.  Need to re-check in 10 days.  Please set up!!  pete

## 2015-12-15 ENCOUNTER — Encounter: Payer: Self-pay | Admitting: Hematology & Oncology

## 2015-12-15 ENCOUNTER — Other Ambulatory Visit (HOSPITAL_BASED_OUTPATIENT_CLINIC_OR_DEPARTMENT_OTHER): Payer: Medicare Other

## 2015-12-15 ENCOUNTER — Ambulatory Visit (HOSPITAL_BASED_OUTPATIENT_CLINIC_OR_DEPARTMENT_OTHER): Payer: Medicare Other | Admitting: Hematology & Oncology

## 2015-12-15 VITALS — BP 148/80 | HR 66 | Temp 97.5°F | Resp 18 | Ht 64.0 in | Wt 145.0 lb

## 2015-12-15 DIAGNOSIS — E119 Type 2 diabetes mellitus without complications: Secondary | ICD-10-CM

## 2015-12-15 DIAGNOSIS — R1011 Right upper quadrant pain: Secondary | ICD-10-CM | POA: Diagnosis not present

## 2015-12-15 DIAGNOSIS — K7581 Nonalcoholic steatohepatitis (NASH): Secondary | ICD-10-CM | POA: Diagnosis not present

## 2015-12-15 DIAGNOSIS — I82402 Acute embolism and thrombosis of unspecified deep veins of left lower extremity: Secondary | ICD-10-CM

## 2015-12-15 DIAGNOSIS — D6861 Antiphospholipid syndrome: Secondary | ICD-10-CM

## 2015-12-15 DIAGNOSIS — M545 Low back pain: Secondary | ICD-10-CM

## 2015-12-15 DIAGNOSIS — K760 Fatty (change of) liver, not elsewhere classified: Secondary | ICD-10-CM

## 2015-12-15 LAB — CBC WITH DIFFERENTIAL (CANCER CENTER ONLY)
BASO#: 0 10*3/uL (ref 0.0–0.2)
BASO%: 0.1 % (ref 0.0–2.0)
EOS%: 0.8 % (ref 0.0–7.0)
Eosinophils Absolute: 0.1 10*3/uL (ref 0.0–0.5)
HCT: 45.9 % (ref 34.8–46.6)
HGB: 16.3 g/dL — ABNORMAL HIGH (ref 11.6–15.9)
LYMPH#: 2 10*3/uL (ref 0.9–3.3)
LYMPH%: 26.4 % (ref 14.0–48.0)
MCH: 34.2 pg — ABNORMAL HIGH (ref 26.0–34.0)
MCHC: 35.5 g/dL (ref 32.0–36.0)
MCV: 96 fL (ref 81–101)
MONO#: 1.1 10*3/uL — ABNORMAL HIGH (ref 0.1–0.9)
MONO%: 15 % — ABNORMAL HIGH (ref 0.0–13.0)
NEUT#: 4.3 10*3/uL (ref 1.5–6.5)
NEUT%: 57.7 % (ref 39.6–80.0)
Platelets: 356 10*3/uL (ref 145–400)
RBC: 4.77 10*6/uL (ref 3.70–5.32)
RDW: 13.2 % (ref 11.1–15.7)
WBC: 7.4 10*3/uL (ref 3.9–10.0)

## 2015-12-15 LAB — COMPREHENSIVE METABOLIC PANEL
ALT: 30 U/L (ref 0–55)
AST: 20 U/L (ref 5–34)
Albumin: 3.6 g/dL (ref 3.5–5.0)
Alkaline Phosphatase: 88 U/L (ref 40–150)
Anion Gap: 9 mEq/L (ref 3–11)
BUN: 18 mg/dL (ref 7.0–26.0)
CO2: 29 mEq/L (ref 22–29)
Calcium: 9.5 mg/dL (ref 8.4–10.4)
Chloride: 103 mEq/L (ref 98–109)
Creatinine: 0.9 mg/dL (ref 0.6–1.1)
EGFR: 69 mL/min/{1.73_m2} — ABNORMAL LOW (ref >90)
Glucose: 191 mg/dl — ABNORMAL HIGH (ref 70–140)
Potassium: 4.3 mEq/L (ref 3.5–5.1)
Sodium: 141 mEq/L (ref 136–145)
Total Bilirubin: 0.98 mg/dL (ref 0.20–1.20)
Total Protein: 6.9 g/dL (ref 6.4–8.3)

## 2015-12-15 LAB — PROTIME-INR (CHCC SATELLITE)
INR: 2.4 (ref 2.0–3.5)
Protime: 28.8 Seconds — ABNORMAL HIGH (ref 10.6–13.4)

## 2015-12-15 NOTE — Progress Notes (Signed)
Hematology and Oncology Follow Up Visit  CHIANN GOFFREDO [id number] [birth year] 70 y.o. 12/15/2015   Principle Diagnosis:  1. Antiphospholipid antibody syndrome. 2. History of recurrent lower extremity deep venous thrombosis. 3. Nonalcoholic steatohepatitis.  Current Therapy:   Coumadin to maintain an INR between 2-3.     Interim History:  Ms.  Desautel is back for followup. Her lower back is doing better. She has been getting some steroid injections. She's had no problems with these injections despite being on Coumadin. She says that her blood sugars do go up quite high.  Her main concern now is this right upper quadrant pain. She's had this for several weeks. It is non-radiating. It is not associated with any nausea. Her gallbladder is out.  She was worried about pancreatitis. I told her this was not pancreatitis pain.  With her cirrhosis, we do have to worry about hepatocellular carcinoma. She is at risk for this. I think that we will need an MRI.  She's had no cough. She's had no rashes. She's had no bleeding.  Today, her INR was 2.4.   She is looking forward to going to the beach over [place] weekend.  Her performance status is ECOG 1.  Medications:  Current Outpatient Prescriptions:  .  benazepril (LOTENSIN) 20 MG tablet, Take 20 mg by mouth every morning. , Disp: , Rfl:  .  Cholecalciferol (VITAMIN D3) 2000 UNITS capsule, Take 2,000 Units by mouth daily., Disp: , Rfl:  .  CINNAMON PO, Take 2 tablets by mouth daily., Disp: , Rfl:  .  glipiZIDE (GLUCOTROL XL) 5 MG 24 hr tablet, , Disp: , Rfl: 1 .  glucose 4 GM chewable tablet, Chew 16 g by mouth as needed for low blood sugar., Disp: , Rfl:  .  OVER THE COUNTER MEDICATION, Take 3 tablets by mouth 2 (two) times daily., Disp: , Rfl:  .  PREMARIN vaginal cream, Place 1 Applicatorful vaginally every Monday, Wednesday, and Friday. , Disp: , Rfl: 3 .  propranolol (INDERAL) 80 MG tablet, Take 80 mg by mouth every morning. ,  Disp: , Rfl:  .  vitamin B-12 (CYANOCOBALAMIN) 1000 MCG tablet, Take 1,000 mcg by mouth daily., Disp: , Rfl:  .  warfarin (COUMADIN) 7.5 MG tablet, Take 1 tablet (7.5 mg total) by mouth daily. 90 day supply., Disp: 90 tablet, Rfl: 3  Allergies:  Allergies  Allergen Reactions  . Caine-1 [Lidocaine Hcl] Anaphylaxis  . Morphine Anaphylaxis  . Zosyn [Piperacillin Sod-Tazobactam So] Itching    "scalp itch"-was given Benadryl to counteract.  . Codeine Nausea And Vomiting and Other (See Comments)  . Dilaudid [Hydromorphone Hcl] Nausea And Vomiting    Can take with zofran  . Morphine And Related Nausea And Vomiting  . Percocet [Oxycodone-Acetaminophen] Nausea And Vomiting  . Tramadol Nausea And Vomiting  . Vicodin [Hydrocodone-Acetaminophen] Nausea And Vomiting    Past Medical History, Surgical history, Social history, and Family History were reviewed and updated.  Review of Systems: As above  Physical Exam:  height is 5' 4"  (1.626 m) and weight is 145 lb (65.8 kg). Her oral temperature is 97.5 F (36.4 C). Her blood pressure is 148/80 (abnormal) and her pulse is 66. Her respiration is 18.   Well-developed and well-nourished white female in no obvious distress. Head and neck exam shows no ocular or oral lesions. There are no palpable cervical or supraclavicular this. Lungs are clear bilateral. Cardiac exam regular rate and rhythm with no murmurs rubs or bruits. Abdomen is  soft. 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 scar. She has no hepatomegaly. There is no fluid wave. Back exam shows no tenderness over spine ribs or hips. Extremities shows no clubbing cyanosis or edema. Skin exam no rashes, ecchymoses or petechia. Neurological exam is nonfocal. Lab Results  Component Value Date   WBC 7.4 12/15/2015   HGB 16.3 (H) 12/15/2015   HCT 45.9 12/15/2015   MCV 96 12/15/2015   PLT 356 12/15/2015     Chemistry      Component Value Date/Time   NA 143 11/03/2015 1157   K  4.0 11/03/2015 1157   CL 104 01/04/2015 0925   CL 101 08/26/2014 1408   CO2 28 11/03/2015 1157   BUN 16.3 11/03/2015 1157   CREATININE 0.8 11/03/2015 1157      Component Value Date/Time   CALCIUM 9.3 11/03/2015 1157   ALKPHOS 74 11/03/2015 1157   AST 28 11/03/2015 1157   ALT 29 11/03/2015 1157   BILITOT 1.85 (H) 11/03/2015 1157      INR is 2.2 Impression and Plan: Ms. Devin is 70 year old white female with the anti-phospholipid antibody syndrome. This really has not been a problem for her. The bigger issue has been the hepatic problems and her diabetes. She has steatosis. She has NASH.  I'm not sure what is causing this right upper quadrant pain. The we have to get a liver MRI see what is going on. With her cirrhosis, I'm unsure how much we can really see with a CT scan or ultrasound. I think the MRI would be the best test for [place].  I'm glad that her back is doing okay area and this is definitely a plus for her.  Her 52th wedding anniversary is coming up in May 2018. She already is planning for this.   I will like to see her back in one month  [name], MD 8/30/201711:00 AM

## 2015-12-16 ENCOUNTER — Encounter: Payer: Self-pay | Admitting: *Deleted

## 2015-12-21 ENCOUNTER — Telehealth: Payer: Self-pay | Admitting: Emergency Medicine

## 2015-12-21 ENCOUNTER — Other Ambulatory Visit: Payer: Self-pay | Admitting: *Deleted

## 2015-12-21 DIAGNOSIS — K7581 Nonalcoholic steatohepatitis (NASH): Secondary | ICD-10-CM

## 2015-12-30 ENCOUNTER — Ambulatory Visit (INDEPENDENT_AMBULATORY_CARE_PROVIDER_SITE_OTHER): Payer: Medicare Other | Admitting: Diagnostic Neuroimaging

## 2015-12-30 ENCOUNTER — Encounter (INDEPENDENT_AMBULATORY_CARE_PROVIDER_SITE_OTHER): Payer: Self-pay | Admitting: Diagnostic Neuroimaging

## 2015-12-30 DIAGNOSIS — Z0289 Encounter for other administrative examinations: Secondary | ICD-10-CM

## 2015-12-30 DIAGNOSIS — M79605 Pain in left leg: Secondary | ICD-10-CM

## 2015-12-30 NOTE — Procedures (Signed)
   [facility]  NCS (NERVE CONDUCTION STUDY) WITH EMG (ELECTROMYOGRAPHY) REPORT   STUDY DATE: 12/30/15 PATIENT NAME: ORIAH LEINWEBER DOB: 11-07-1945 MRN: [id number]  ORDERING CLINICIAN: Richardean Chimera  TECHNOLOGIST: Laretta Alstrom ELECTROMYOGRAPHER: Earlean Polka. Nathifa Ritthaler, MD  CLINICAL INFORMATION: 70 year old female with left leg pain. Patient has history of diabetes, and is also on chronic anticoagulation with Coumadin.   FINDINGS: NERVE CONDUCTION STUDY: Left peroneal and tibial motor responses and F wave latencies are normal. Bilateral H reflex responses are normal. Left peroneal sensory response is normal.   NEEDLE ELECTROMYOGRAPHY: Needle examination of left lower extremity vastus medialis, tibials anterior, gastrocnemius is normal. Lumbar paraspinal muscles were deferred due to chronic anticoagulation.   IMPRESSION:  Normal study. No electrodiagnostic evidence of large fiber neuropathy at this time.    INTERPRETING PHYSICIAN:  Penni Bombard, MD Certified in Neurology, Neurophysiology and Neuroimaging  [facility] [address], [place], [place] [address] [phone]

## 2016-01-03 ENCOUNTER — Ambulatory Visit
Admission: RE | Admit: 2016-01-03 | Discharge: 2016-01-03 | Disposition: A | Discharge status: Home / Self Care | Payer: Medicare Other | Source: Ambulatory Visit | Attending: Hematology & Oncology | Admitting: Hematology & Oncology

## 2016-01-03 DIAGNOSIS — K7581 Nonalcoholic steatohepatitis (NASH): Secondary | ICD-10-CM

## 2016-01-03 MED ORDER — GADOBENATE DIMEGLUMINE 529 MG/ML IV SOLN
15.0000 mL | Freq: Once | INTRAVENOUS | Status: AC | PRN
  Administered 2016-01-03: 15 mL via INTRAVENOUS

## 2016-01-15 ENCOUNTER — Encounter (HOSPITAL_COMMUNITY): Payer: Self-pay

## 2016-01-15 ENCOUNTER — Emergency Department (HOSPITAL_COMMUNITY)
Admission: EM | Admit: 2016-01-15 | Discharge: 2016-01-15 | Disposition: A | Discharge status: Home / Self Care | Payer: Medicare Other | Attending: Emergency Medicine | Admitting: Emergency Medicine

## 2016-01-15 ENCOUNTER — Emergency Department (HOSPITAL_COMMUNITY): Payer: Medicare Other

## 2016-01-15 DIAGNOSIS — Z7984 Long term (current) use of oral hypoglycemic drugs: Secondary | ICD-10-CM | POA: Diagnosis not present

## 2016-01-15 DIAGNOSIS — E119 Type 2 diabetes mellitus without complications: Secondary | ICD-10-CM | POA: Insufficient documentation

## 2016-01-15 DIAGNOSIS — I10 Essential (primary) hypertension: Secondary | ICD-10-CM | POA: Insufficient documentation

## 2016-01-15 DIAGNOSIS — Z8673 Personal history of transient ischemic attack (TIA), and cerebral infarction without residual deficits: Secondary | ICD-10-CM | POA: Diagnosis not present

## 2016-01-15 DIAGNOSIS — G4485 Primary stabbing headache: Secondary | ICD-10-CM | POA: Insufficient documentation

## 2016-01-15 DIAGNOSIS — J45909 Unspecified asthma, uncomplicated: Secondary | ICD-10-CM | POA: Insufficient documentation

## 2016-01-15 DIAGNOSIS — Z7901 Long term (current) use of anticoagulants: Secondary | ICD-10-CM | POA: Diagnosis not present

## 2016-01-15 DIAGNOSIS — R51 Headache: Secondary | ICD-10-CM | POA: Diagnosis present

## 2016-01-15 LAB — BASIC METABOLIC PANEL
Anion gap: 11 (ref 5–15)
BUN: 14 mg/dL (ref 6–20)
CO2: 25 mmol/L (ref 22–32)
Calcium: 9.9 mg/dL (ref 8.9–10.3)
Chloride: 104 mmol/L (ref 101–111)
Creatinine, Ser: 0.69 mg/dL (ref 0.44–1.00)
GFR calc Af Amer: >60 mL/min (ref >60)
GFR calc non Af Amer: >60 mL/min (ref >60)
Glucose, Bld: 126 mg/dL — ABNORMAL HIGH (ref 65–99)
Potassium: 3.6 mmol/L (ref 3.5–5.1)
Sodium: 140 mmol/L (ref 135–145)

## 2016-01-15 LAB — CBG MONITORING, ED: Glucose-Capillary: 118 mg/dL — ABNORMAL HIGH (ref 65–99)

## 2016-01-15 LAB — CBC
HCT: 44.4 % (ref 36.0–46.0)
Hemoglobin: 14.8 g/dL (ref 12.0–15.0)
MCH: 32.5 pg (ref 26.0–34.0)
MCHC: 33.3 g/dL (ref 30.0–36.0)
MCV: 97.4 fL (ref 78.0–100.0)
Platelets: 335 10*3/uL (ref 150–400)
RBC: 4.56 MIL/uL (ref 3.87–5.11)
RDW: 13.1 % (ref 11.5–15.5)
WBC: 8.6 10*3/uL (ref 4.0–10.5)

## 2016-01-15 LAB — I-STAT TROPONIN, ED
Troponin i, poc: 0 ng/mL (ref 0.00–0.08)
Troponin i, poc: 0 ng/mL (ref 0.00–0.08)

## 2016-01-15 LAB — PROTIME-INR
INR: 2.02
Prothrombin Time: 23.1 seconds — ABNORMAL HIGH (ref 11.4–15.2)

## 2016-01-15 MED ORDER — ACETAMINOPHEN 325 MG PO TABS
650.0000 mg | ORAL_TABLET | Freq: Once | ORAL | Status: AC
  Administered 2016-01-15: 650 mg via ORAL
  Filled 2016-01-15: qty 2

## 2016-01-15 NOTE — ED Provider Notes (Signed)
[facility] DEPT Provider Note   CSN: [id number] Arrival date & time: 01/15/16  6606     History   Chief Complaint Chief Complaint  Patient presents with  . Chest Pain    HPI Laurie Green is a 70 y.o. female.  This a 70 year old female with a number of medical issues who presents with 2 hours of sudden onset, sharp, frontal headache, nausea, followed by left arm pain and weakness.  She states that she's had a number of episodes like this in the past 6 months and has been evaluated by neurology, cardiology, her primary care physician all with normal findings.      Past Medical History:  Diagnosis Date  . 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 Antiphospholipid antibody with hypercoagulable state ([place]) 11/07/2012  . Arthritis   . Asthma    well controlled, no rescue inhaler used  . Blood dyscrasia    anticardiolipid syndrome  . Blood transfusion    age 11 yr old-none since  . Celiac disease   . Diabetes mellitus   . Diverticulitis   . DVT (deep venous thrombosis) ([place]) 05/01/2011  . Fibromyalgia   . GERD (gastroesophageal reflux disease)   . Heart murmur   . Hypertension   . NASH (nonalcoholic steatohepatitis) 05/01/2011  . Nausea & vomiting    11-17-14 "nausea and vomiting after meals" at present discussed with Dr. Oletta Lamas office per pt.  Marland Kitchen PONV (postoperative nausea and vomiting)   . Portal hypertension ([place])   . Stroke ([place])    tia  . TIA (transient ischemic attack)   . Varices 05/01/2011  . Varices, esophageal ([place])   . Varices, gastric     Patient Active Problem List   Diagnosis Date Noted  . Antiphospholipid antibody with hypercoagulable state ([place]) 11/07/2012  . Transaminitis 09/07/2012  . Fever, unspecified 09/07/2012  . Abdominal pain 09/04/2012  . Subtherapeutic international normalized ratio (INR) 09/04/2012  . Diabetes mellitus ([place]) 09/04/2012  . Leukocytosis, unspecified 09/04/2012  . Varices,  esophageal ([place]) 09/04/2012  . Transaminasemia 09/04/2012  . DVT (deep venous thrombosis) ([place]) 05/01/2011  . Varices 05/01/2011  . NASH (nonalcoholic steatohepatitis) 05/01/2011  . Incisional hernia without mention of obstruction or gangrene 02/14/2011    Past Surgical History:  Procedure Laterality Date  . ABDOMINAL HYSTERECTOMY    . ANKLE FRACTURE SURGERY Left    retained hardware  . CHOLECYSTECTOMY    . COLON SURGERY     hx. diverticulitis  . COLONOSCOPY N/A 11/23/2014   Procedure: COLONOSCOPY;  Surgeon: [name], MD;  Location: [facility] ENDOSCOPY;  Service: Endoscopy;  Laterality: N/A;  . ESOPHAGOGASTRODUODENOSCOPY  04/28/2011   Procedure: ESOPHAGOGASTRODUODENOSCOPY (EGD);  Surgeon: [name]., MD;  Location[facility] ENDOSCOPY;  Service: Endoscopy;  Laterality: N/A;  . ESOPHAGOGASTRODUODENOSCOPY N/A 11/23/2014   Procedure: ESOPHAGOGASTRODUODENOSCOPY (EGD);  Surgeon: [name], MD;  Location: [facility] ENDOSCOPY;  Service: Endoscopy;  Laterality: N/A;  . EUS N/A 09/25/2012   Procedure: ESOPHAGEAL ENDOSCOPIC ULTRASOUND (EUS) RADIAL;  Surgeon: Arta Silence, MD;  Location: [facility] ENDOSCOPY;  Service: Endoscopy;  Laterality: N/A;  . HERNIA REPAIR     luq incisional  . SPLENECTOMY, TOTAL    . TONSILLECTOMY      OB History    No data available       Home Medications    Prior to Admission medications   Medication Sig Start Date End Date Taking? Authorizing Provider  benazepril (LOTENSIN) 20 MG tablet Take 20 mg by  mouth every morning.    Yes Historical Provider, MD  Cholecalciferol (VITAMIN D3) 2000 UNITS capsule Take 2,000 Units by mouth daily.   Yes Historical Provider, MD  glipiZIDE (GLUCOTROL XL) 5 MG 24 hr tablet Take 5 mg by mouth daily with breakfast.  10/28/15  Yes Historical Provider, MD  glucose 4 GM chewable tablet Chew 16 g by mouth as needed for low blood sugar.   Yes Historical Provider, MD  OVER THE COUNTER MEDICATION Take 1 tablet by mouth every morning.    Yes  Historical Provider, MD  OVER THE COUNTER MEDICATION Take 1 capsule by mouth every morning. [place]   Yes Historical Provider, MD  oxymetazoline (AFRIN) 0.05 % nasal spray Place 1 spray into both nostrils 2 (two) times daily as needed for congestion.   Yes Historical Provider, MD  PREMARIN vaginal cream Place 1 Applicatorful vaginally 2 (two) times a week. Monday and Wednesday or Thursday. 08/18/14  Yes Historical Provider, MD  propranolol (INDERAL) 80 MG tablet Take 80 mg by mouth every morning.    Yes Historical Provider, MD  vitamin B-12 (CYANOCOBALAMIN) 1000 MCG tablet Take 1,000 mcg by mouth daily.   Yes Historical Provider, MD  warfarin (COUMADIN) 7.5 MG tablet Take 1 tablet (7.5 mg total) by mouth daily. 90 day supply. 06/24/15  Yes [name], MD    Family History History reviewed. No pertinent family history.  Social History Social History  Substance Use Topics  . Smoking status: Never Smoker  . Smokeless tobacco: Never Used     Comment: never used tobacco  . Alcohol use No     Allergies   Caine-1 [lidocaine hcl]; Morphine; Zosyn [piperacillin sod-tazobactam so]; Codeine; Dilaudid [hydromorphone hcl]; Morphine and related; Percocet [oxycodone-acetaminophen]; Tramadol; and Vicodin [hydrocodone-acetaminophen]   Review of Systems Review of Systems  Constitutional: Negative for chills and fever.  HENT: Negative for congestion and rhinorrhea.   Eyes: Negative for visual disturbance.  Respiratory: Negative for shortness of breath.   Cardiovascular: Positive for chest pain. Negative for palpitations and leg swelling.  Gastrointestinal: Positive for nausea.  Genitourinary: Negative for dysuria and frequency.  Musculoskeletal: Positive for arthralgias.  Skin: Negative for color change.  Neurological: Positive for weakness and headaches.  All other systems reviewed and are negative.    Physical Exam Updated Vital Signs BP 132/69   Pulse 80   Temp 98.8 F (37.1 C) (Oral)    Resp 16   SpO2 94%   Physical Exam  Constitutional: She is oriented to person, place, and time. She appears well-developed and well-nourished. No distress.  HENT:  Head: Normocephalic.  Eyes: Pupils are equal, round, and reactive to light.  Neck: Normal range of motion.  Cardiovascular: Normal rate.   Murmur heard. Pulmonary/Chest: Effort normal.  Abdominal: Soft.  Musculoskeletal: Normal range of motion.  Neurological: She is alert and oriented to person, place, and time.  Skin: Skin is warm and dry. No rash noted.  Nursing note and vitals reviewed.    ED Treatments / Results  Labs (all labs ordered are listed, but only abnormal results are displayed) Labs Reviewed  BASIC METABOLIC PANEL - Abnormal; Notable for the following:       Result Value   Glucose, Bld 126 (*)    All other components within normal limits  PROTIME-INR - Abnormal; Notable for the following:    Prothrombin Time 23.1 (*)    All other components within normal limits  CBG MONITORING, ED - Abnormal; Notable for the following:  Glucose-Capillary 118 (*)    All other components within normal limits  CBC  I-STAT TROPOININ, ED  I-STAT TROPOININ, ED    EKG  EKG Interpretation None       Radiology Dg Chest 2 View  Result Date: 01/15/2016 CLINICAL DATA:  Sharp pain in head, nausea, chest tightness. Cough with some shortness of breath. History of asthma, hypertension, diabetes. EXAM: CHEST  2 VIEW COMPARISON:  Chest x-ray dated 03/25/2013. FINDINGS: Heart size and mediastinal contours are normal. Lungs are clear. No pleural effusion or pneumothorax seen. Osseous and soft tissue structures about the chest are unremarkable. Surgical clips again noted within the upper abdomen. IMPRESSION: No active cardiopulmonary disease.  No evidence of pneumonia. Electronically Signed   By: Franki Cabot M.D.   On: 01/15/2016 19:49   Ct Head Wo Contrast  Result Date: 01/15/2016 CLINICAL DATA:  Onset today 6pm pt  started having sharp pain in head and nausea, then started having left arm pain and chest tightness Pt sts she felt like she was having a heart attack EXAM: CT HEAD WITHOUT CONTRAST TECHNIQUE: Contiguous axial images were obtained from the base of the skull through the vertex without intravenous contrast. COMPARISON:  None. FINDINGS: Brain: No acute intracranial hemorrhage. No focal mass lesion. No CT evidence of acute infarction. No midline shift or mass effect. No hydrocephalus. Basilar cisterns are patent. Vascular: No hyperdense vessel or unexpected calcification. Skull: Normal. Negative for fracture or focal lesion. Sinuses/Orbits: No acute finding. Other: None. IMPRESSION: Normal head CT. Electronically Signed   By: Suzy Bouchard M.D.   On: 01/15/2016 23:33    Procedures Procedures (including critical care time)  Medications Ordered in ED Medications  acetaminophen (TYLENOL) tablet 650 mg (650 mg Oral Given 01/15/16 2118)     Initial Impression / Assessment and Plan / ED Course  I have reviewed the triage vital signs and the nursing notes.  Pertinent labs & imaging results that were available during my care of the patient were reviewed by me and considered in my medical decision making (see chart for details).  Clinical Course       Final Clinical Impressions(s) / ED Diagnoses   Final diagnoses:  Primary stabbing headache    New Prescriptions New Prescriptions   No medications on file     Junius Creamer, NP 01/15/16 [place]    Junius Creamer, NP 01/16/16 0045    Gwenyth Allegra Tegeler, MD 01/16/16 [phone]

## 2016-01-15 NOTE — ED Notes (Signed)
Pt verbalized understanding of d/c instructions and has no further questions. Pt stable and NAD.  

## 2016-01-15 NOTE — ED Triage Notes (Signed)
Onset today 6pm pt started having sharp pain in head and nausea, then started having left arm pain and chest tightness.

## 2016-01-15 NOTE — Discharge Instructions (Signed)
Right your evaluated for cardiac as well as neurologic symptoms get 2 sets of negative troponin or cardiac markers, which is very reassuring.  Your head CT is normal.  Lab work was evaluated as well and is normal.  Please make an appointment with your primary care physician to continue evaluation of the episodes that she been having for the past 6 months.  Return anytime you have new worsening or concerning symptoms

## 2016-01-19 ENCOUNTER — Other Ambulatory Visit (HOSPITAL_BASED_OUTPATIENT_CLINIC_OR_DEPARTMENT_OTHER): Payer: Medicare Other

## 2016-01-19 ENCOUNTER — Encounter: Payer: Self-pay | Admitting: Hematology & Oncology

## 2016-01-19 ENCOUNTER — Ambulatory Visit (HOSPITAL_BASED_OUTPATIENT_CLINIC_OR_DEPARTMENT_OTHER): Payer: Medicare Other | Admitting: Hematology & Oncology

## 2016-01-19 VITALS — BP 149/77 | HR 60 | Temp 97.4°F | Resp 18 | Ht 64.0 in | Wt 146.0 lb

## 2016-01-19 DIAGNOSIS — E119 Type 2 diabetes mellitus without complications: Secondary | ICD-10-CM

## 2016-01-19 DIAGNOSIS — K7581 Nonalcoholic steatohepatitis (NASH): Secondary | ICD-10-CM

## 2016-01-19 DIAGNOSIS — D6861 Antiphospholipid syndrome: Secondary | ICD-10-CM

## 2016-01-19 DIAGNOSIS — K760 Fatty (change of) liver, not elsewhere classified: Secondary | ICD-10-CM

## 2016-01-19 DIAGNOSIS — R1011 Right upper quadrant pain: Secondary | ICD-10-CM

## 2016-01-19 DIAGNOSIS — K862 Cyst of pancreas: Secondary | ICD-10-CM | POA: Diagnosis not present

## 2016-01-19 LAB — CBC WITH DIFFERENTIAL (CANCER CENTER ONLY)
BASO#: 0 10*3/uL (ref 0.0–0.2)
BASO%: 0.7 % (ref 0.0–2.0)
EOS%: 2.3 % (ref 0.0–7.0)
Eosinophils Absolute: 0.1 10*3/uL (ref 0.0–0.5)
HCT: 44.5 % (ref 34.8–46.6)
HGB: 15.7 g/dL (ref 11.6–15.9)
LYMPH#: 2 10*3/uL (ref 0.9–3.3)
LYMPH%: 32.7 % (ref 14.0–48.0)
MCH: 33.7 pg (ref 26.0–34.0)
MCHC: 35.3 g/dL (ref 32.0–36.0)
MCV: 96 fL (ref 81–101)
MONO#: 0.7 10*3/uL (ref 0.1–0.9)
MONO%: 11.6 % (ref 0.0–13.0)
NEUT#: 3.2 10*3/uL (ref 1.5–6.5)
NEUT%: 52.7 % (ref 39.6–80.0)
Platelets: 308 10*3/uL (ref 145–400)
RBC: 4.66 10*6/uL (ref 3.70–5.32)
RDW: 13 % (ref 11.1–15.7)
WBC: 6.1 10*3/uL (ref 3.9–10.0)

## 2016-01-19 LAB — COMPREHENSIVE METABOLIC PANEL
ALT: 28 U/L (ref 0–55)
AST: 35 U/L — ABNORMAL HIGH (ref 5–34)
Albumin: 3.5 g/dL (ref 3.5–5.0)
Alkaline Phosphatase: 95 U/L (ref 40–150)
Anion Gap: 9 mEq/L (ref 3–11)
BUN: 12.5 mg/dL (ref 7.0–26.0)
CO2: 29 mEq/L (ref 22–29)
Calcium: 9.7 mg/dL (ref 8.4–10.4)
Chloride: 105 mEq/L (ref 98–109)
Creatinine: 0.8 mg/dL (ref 0.6–1.1)
EGFR: 74 mL/min/{1.73_m2} — ABNORMAL LOW (ref >90)
Glucose: 139 mg/dl (ref 70–140)
Potassium: 4 mEq/L (ref 3.5–5.1)
Sodium: 143 mEq/L (ref 136–145)
Total Bilirubin: 1.01 mg/dL (ref 0.20–1.20)
Total Protein: 7 g/dL (ref 6.4–8.3)

## 2016-01-19 LAB — PROTIME-INR (CHCC SATELLITE)
INR: 2.6 (ref 2.0–3.5)
Protime: 31.2 Seconds — ABNORMAL HIGH (ref 10.6–13.4)

## 2016-01-19 NOTE — Progress Notes (Signed)
Hematology and Oncology Follow Up Visit  Laurie Green [id number] 07-06-1945 70 y.o. 01/19/2016   Principle Diagnosis:  1. Antiphospholipid antibody syndrome. 2. History of recurrent lower extremity deep venous thrombosis. 3. Nonalcoholic steatohepatitis.  Current Therapy:   Coumadin to maintain an INR between 2-3.     Interim History:  Ms.  Green is back for followup. Laurie Green lower back is doing better. Laurie Green has been getting some steroid injections. Laurie Green's had no problems with these injections despite being on Coumadin. Laurie Green says that Laurie Green blood sugars do go up quite high.  We did go ahead and do a MRI of Laurie Green abdomen. 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 Laurie Green has steatosis in the liver. There is no evidence of any type of malignancy.   Laurie Green also has some cardiac issues since we last saw Laurie Green. Laurie Green'll go to the emergency room. They thought that Laurie Green had a myocardial infarction. However, the test all came back negative. Laurie Green will see cardiologist in the near future.   Laurie Green 50th high school reunion is coming up. This will be this weekend. Laurie Green is looking for to this.   Laurie Green has had no bleeding. Laurie Green has had no bruising. As always, Laurie Green blood sugars have been on the high side.   Laurie Green's not had any issues with fever. Laurie Green's had no rashes.   Laurie Green performance status is ECOG 1.  Medications:  Current Outpatient Prescriptions:  .  benazepril (LOTENSIN) 20 MG tablet, Take 20 mg by mouth every morning. , Disp: , Rfl:  .  Cholecalciferol (VITAMIN D3) 2000 UNITS capsule, Take 2,000 Units by mouth daily., Disp: , Rfl:  .  glipiZIDE (GLUCOTROL XL) 5 MG 24 hr tablet, Take 5 mg by mouth daily with breakfast. , Disp: , Rfl: 1 .  glucose 4 GM chewable tablet, Chew 16 g by mouth as needed for low blood sugar., Disp: , Rfl:  .  OVER THE COUNTER MEDICATION, Take 1 tablet by mouth every morning. , Disp: , Rfl:  .  OVER THE COUNTER MEDICATION, Take 1 capsule by mouth every morning. [place], Disp: , Rfl:  .   PREMARIN vaginal cream, Place 1 Applicatorful vaginally 2 (two) times a week. Monday and Wednesday or Thursday., Disp: , Rfl: 3 .  propranolol (INDERAL) 80 MG tablet, Take 80 mg by mouth every morning. , Disp: , Rfl:  .  vitamin B-12 (CYANOCOBALAMIN) 1000 MCG tablet, Take 1,000 mcg by mouth daily., Disp: , Rfl:  .  warfarin (COUMADIN) 7.5 MG tablet, Take 1 tablet (7.5 mg total) by mouth daily. 90 day supply., Disp: 90 tablet, Rfl: 3  Allergies:  Allergies  Allergen Reactions  . Caine-1 [Lidocaine Hcl] Anaphylaxis  . Morphine Anaphylaxis  . Zosyn [Piperacillin Sod-Tazobactam So] Itching    "scalp itch"-was given Benadryl to counteract.  . Codeine Nausea And Vomiting and Other (See Comments)  . Dilaudid [Hydromorphone Hcl] Nausea And Vomiting    Can take with zofran  . Morphine And Related Nausea And Vomiting  . Percocet [Oxycodone-Acetaminophen] Nausea And Vomiting  . Tramadol Nausea And Vomiting  . Vicodin [Hydrocodone-Acetaminophen] Nausea And Vomiting    Past Medical History, Surgical history, Social history, and Family History were reviewed and updated.  Review of Systems: As above  Physical Exam:  height is 5' 4"  (1.626 m) and weight is 146 lb (66.2 kg). Laurie Green oral temperature is 97.4 F (36.3 C). Laurie Green blood pressure is 149/77 (abnormal) and Laurie Green pulse is 60. Laurie Green respiration is 18.  Well-developed and well-nourished white female in no obvious distress. Head and neck exam shows no ocular or oral lesions. There are no palpable cervical or supraclavicular this. Lungs are clear bilateral. Cardiac exam regular rate and rhythm with no murmurs rubs or bruits. Abdomen is soft. 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 scar. Laurie Green has no hepatomegaly. There is no fluid wave. Back exam shows no tenderness over spine ribs or hips. Extremities shows no clubbing cyanosis or edema. Skin exam no rashes, ecchymoses or petechia. Neurological exam is nonfocal. Lab Results  Component  Value Date   WBC 6.1 01/19/2016   HGB 15.7 01/19/2016   HCT 44.5 01/19/2016   MCV 96 01/19/2016   PLT 308 01/19/2016     Chemistry      Component Value Date/Time   NA 140 01/15/2016 1931   NA 141 12/15/2015 0918   K 3.6 01/15/2016 1931   K 4.3 12/15/2015 0918   CL 104 01/15/2016 1931   CL 101 08/26/2014 1408   CO2 25 01/15/2016 1931   CO2 29 12/15/2015 0918   BUN 14 01/15/2016 1931   BUN 18.0 12/15/2015 0918   CREATININE 0.69 01/15/2016 1931   CREATININE 0.9 12/15/2015 0918      Component Value Date/Time   CALCIUM 9.9 01/15/2016 1931   CALCIUM 9.5 12/15/2015 0918   ALKPHOS 88 12/15/2015 0918   AST 20 12/15/2015 0918   ALT 30 12/15/2015 0918   BILITOT 0.98 12/15/2015 0918      INR is 2.2 Impression and Plan: Laurie Green is 70 year old white female with the anti-phospholipid antibody syndrome. This really has not been a problem for Laurie Green. The bigger issue has been the hepatic problems and Laurie Green diabetes. Laurie Green has steatosis. Laurie Green has NASH.  Hopefully, Laurie Green'll not have any Cardiac issues. Laurie Green will see the cardiologist in the next few weeks. I would think that since Laurie Green is on Coumadin, that the chance of having a heart attack would be low.  At this point, I think we can probably get Laurie Green back now in 2 months. Everything looks pretty stable from my point of view. t   [name], MD 10/4/20172:21 PM

## 2016-01-20 LAB — IRON AND TIBC
%SAT: 27 % (ref 21–57)
Iron: 83 ug/dL (ref 41–142)
TIBC: 313 ug/dL (ref 236–444)
UIBC: 229 ug/dL (ref 120–384)

## 2016-01-20 LAB — FERRITIN: Ferritin: 117 ng/ml (ref 9–269)

## 2016-03-22 ENCOUNTER — Ambulatory Visit (HOSPITAL_BASED_OUTPATIENT_CLINIC_OR_DEPARTMENT_OTHER): Payer: Medicare Other | Admitting: Hematology & Oncology

## 2016-03-22 ENCOUNTER — Other Ambulatory Visit (HOSPITAL_BASED_OUTPATIENT_CLINIC_OR_DEPARTMENT_OTHER): Payer: Medicare Other

## 2016-03-22 VITALS — BP 130/69 | HR 59 | Temp 97.9°F | Resp 20 | Wt 148.8 lb

## 2016-03-22 DIAGNOSIS — D6861 Antiphospholipid syndrome: Secondary | ICD-10-CM

## 2016-03-22 DIAGNOSIS — D6851 Activated protein C resistance: Secondary | ICD-10-CM

## 2016-03-22 DIAGNOSIS — K760 Fatty (change of) liver, not elsewhere classified: Secondary | ICD-10-CM | POA: Diagnosis not present

## 2016-03-22 DIAGNOSIS — G8929 Other chronic pain: Secondary | ICD-10-CM | POA: Diagnosis not present

## 2016-03-22 DIAGNOSIS — I82401 Acute embolism and thrombosis of unspecified deep veins of right lower extremity: Secondary | ICD-10-CM

## 2016-03-22 DIAGNOSIS — K7581 Nonalcoholic steatohepatitis (NASH): Secondary | ICD-10-CM

## 2016-03-22 DIAGNOSIS — E119 Type 2 diabetes mellitus without complications: Secondary | ICD-10-CM

## 2016-03-22 DIAGNOSIS — M545 Low back pain: Secondary | ICD-10-CM

## 2016-03-22 LAB — CBC WITH DIFFERENTIAL (CANCER CENTER ONLY)
BASO#: 0.1 10*3/uL (ref 0.0–0.2)
BASO%: 0.8 % (ref 0.0–2.0)
EOS%: 2.7 % (ref 0.0–7.0)
Eosinophils Absolute: 0.2 10*3/uL (ref 0.0–0.5)
HCT: 43.8 % (ref 34.8–46.6)
HGB: 15.4 g/dL (ref 11.6–15.9)
LYMPH#: 1.6 10*3/uL (ref 0.9–3.3)
LYMPH%: 24.6 % (ref 14.0–48.0)
MCH: 33.6 pg (ref 26.0–34.0)
MCHC: 35.2 g/dL (ref 32.0–36.0)
MCV: 96 fL (ref 81–101)
MONO#: 0.7 10*3/uL (ref 0.1–0.9)
MONO%: 10.7 % (ref 0.0–13.0)
NEUT#: 4 10*3/uL (ref 1.5–6.5)
NEUT%: 61.2 % (ref 39.6–80.0)
Platelets: 259 10*3/uL (ref 145–400)
RBC: 4.58 10*6/uL (ref 3.70–5.32)
RDW: 13 % (ref 11.1–15.7)
WBC: 6.6 10*3/uL (ref 3.9–10.0)

## 2016-03-22 LAB — COMPREHENSIVE METABOLIC PANEL WITH GFR
ALT: 29 U/L (ref 0–55)
AST: 27 U/L (ref 5–34)
Albumin: 3.6 g/dL (ref 3.5–5.0)
Alkaline Phosphatase: 91 U/L (ref 40–150)
Anion Gap: 10 meq/L (ref 3–11)
BUN: 20 mg/dL (ref 7.0–26.0)
CO2: 28 meq/L (ref 22–29)
Calcium: 9.7 mg/dL (ref 8.4–10.4)
Chloride: 103 meq/L (ref 98–109)
Creatinine: 0.8 mg/dL (ref 0.6–1.1)
EGFR: 73 mL/min/{1.73_m2} — ABNORMAL LOW
Glucose: 220 mg/dL — ABNORMAL HIGH (ref 70–140)
Potassium: 4.1 meq/L (ref 3.5–5.1)
Sodium: 140 meq/L (ref 136–145)
Total Bilirubin: 1.34 mg/dL — ABNORMAL HIGH (ref 0.20–1.20)
Total Protein: 7 g/dL (ref 6.4–8.3)

## 2016-03-22 LAB — PROTIME-INR (CHCC SATELLITE)
INR: 1.9 — ABNORMAL LOW (ref 2.0–3.5)
Protime: 22.8 Seconds — ABNORMAL HIGH (ref 10.6–13.4)

## 2016-03-22 NOTE — Progress Notes (Signed)
Hematology and Oncology Follow Up Visit  Laurie Green [id number] 1946-03-26 70 y.o. 03/22/2016   Principle Diagnosis:  1. Antiphospholipid antibody syndrome. 2. History of recurrent lower extremity deep venous thrombosis. 3. Nonalcoholic steatohepatitis.  Current Therapy:   Coumadin to maintain an INR between 2-3.     Interim History:  Ms.  Green is back for followup. Her lower back is bothering her quite a bit. She's had epidural steroids. I'm sure she would be considered a surgical candidate. I think she has degenerative changes. She may have some stenosis.   She is having blood sugar problems. She is on diabetic medications. She says that she does not digest these. She says that when they "come out" they still have their coating on. Her family doctor, from what Laurie Green says, is not too worried about this.   She's had no obvious bleeding. She's had no abdominal pain. Her right sided abdominal pain is actually a little bit better.  She's had no cough or shortness of breath.  She says her left ankle swelled up one day while driving. This got better on its own.   She had a nice Thanksgiving. She is looking forward to Christmas.  Her performance status is ECOG 1.   Medications:  Current Outpatient Prescriptions:  .  benazepril (LOTENSIN) 20 MG tablet, Take 20 mg by mouth every morning. , Disp: , Rfl:  .  Cholecalciferol (VITAMIN D3) 2000 UNITS capsule, Take 2,000 Units by mouth daily., Disp: , Rfl:  .  glucose 4 GM chewable tablet, Chew 16 g by mouth as needed for low blood sugar., Disp: , Rfl:  .  OVER THE COUNTER MEDICATION, Take 1 tablet by mouth every morning. , Disp: , Rfl:  .  pioglitazone (ACTOS) 15 MG tablet, 15 mg daily., Disp: , Rfl: 1 .  PREMARIN vaginal cream, Place 1 Applicatorful vaginally 2 (two) times a week. Monday and Wednesday or Thursday., Disp: , Rfl: 3 .  propranolol (INDERAL) 80 MG tablet, Take 80 mg by mouth every morning. , Disp: , Rfl:  .   vitamin B-12 (CYANOCOBALAMIN) 1000 MCG tablet, Take 1,000 mcg by mouth daily., Disp: , Rfl:  .  warfarin (COUMADIN) 7.5 MG tablet, Take 1 tablet (7.5 mg total) by mouth daily. 90 day supply., Disp: 90 tablet, Rfl: 3  Allergies:  Allergies  Allergen Reactions  . Caine-1 [Lidocaine Hcl] Anaphylaxis  . Morphine Anaphylaxis  . Zosyn [Piperacillin Sod-Tazobactam So] Itching    "scalp itch"-was given Benadryl to counteract.  . Codeine Nausea And Vomiting and Other (See Comments)  . Dilaudid [Hydromorphone Hcl] Nausea And Vomiting    Can take with zofran  . Morphine And Related Nausea And Vomiting  . Percocet [Oxycodone-Acetaminophen] Nausea And Vomiting  . Tramadol Nausea And Vomiting  . Vicodin [Hydrocodone-Acetaminophen] Nausea And Vomiting    Past Medical History, Surgical history, Social history, and Family History were reviewed and updated.  Review of Systems: As above  Physical Exam:  weight is 148 lb 12.8 oz (67.5 kg). Her temperature is 97.9 F (36.6 C). Her blood pressure is 130/69 and her pulse is 59 (abnormal). Her respiration is 20.   Well-developed and well-nourished white female in no obvious distress. Head and neck exam shows no ocular or oral lesions. There are no palpable cervical or supraclavicular this. Lungs are clear bilateral. Cardiac exam regular rate and rhythm with no murmurs rubs or bruits. Abdomen is soft. 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 scar. She has  no hepatomegaly. There is no fluid wave. Back exam shows no tenderness over spine ribs or hips. Extremities shows no clubbing cyanosis or edema. Skin exam no rashes, ecchymoses or petechia. Neurological exam is nonfocal. Lab Results  Component Value Date   WBC 6.6 03/22/2016   HGB 15.4 03/22/2016   HCT 43.8 03/22/2016   MCV 96 03/22/2016   PLT 259 03/22/2016     Chemistry      Component Value Date/Time   NA 143 01/19/2016 1314   K 4.0 01/19/2016 1314   CL 104 01/15/2016 1931    CL 101 08/26/2014 1408   CO2 29 01/19/2016 1314   BUN 12.5 01/19/2016 1314   CREATININE 0.8 01/19/2016 1314      Component Value Date/Time   CALCIUM 9.7 01/19/2016 1314   ALKPHOS 95 01/19/2016 1314   AST 35 (H) 01/19/2016 1314   ALT 28 01/19/2016 1314   BILITOT 1.01 01/19/2016 1314      INR is 1.9  Impression and Plan: Laurie Green is 70 year old white female with the anti-phospholipid antibody syndrome. This really has not been a problem for her. The bigger issue has been the hepatic problems and her diabetes. She has steatosis. She has NASH.  I feel bad that she's having since sometime with the diabetes. Sounds like she may have to go on insulin. His sounds like her family doctor is a low bit reluctant to go down that road.  I feel bad that she has the chronic back problems. There is no much I can be done with that from my understanding.  I think her INR is okay. I would not adjust her Coumadin dose.  We are following her every 2 months. Of the weaning keep her at 2 month follow-up.   [name], MD 12/6/20171:35 PM

## 2016-03-23 ENCOUNTER — Encounter: Payer: Self-pay | Admitting: *Deleted

## 2016-04-13 ENCOUNTER — Encounter: Payer: Self-pay | Admitting: Hematology & Oncology

## 2016-04-27 DIAGNOSIS — H029 Unspecified disorder of eyelid: Secondary | ICD-10-CM | POA: Diagnosis not present

## 2016-05-10 DIAGNOSIS — H029 Unspecified disorder of eyelid: Secondary | ICD-10-CM | POA: Diagnosis not present

## 2016-05-10 DIAGNOSIS — H0289 Other specified disorders of eyelid: Secondary | ICD-10-CM | POA: Diagnosis not present

## 2016-05-11 DIAGNOSIS — Z794 Long term (current) use of insulin: Secondary | ICD-10-CM | POA: Diagnosis not present

## 2016-05-11 DIAGNOSIS — E1165 Type 2 diabetes mellitus with hyperglycemia: Secondary | ICD-10-CM | POA: Diagnosis not present

## 2016-05-11 DIAGNOSIS — I10 Essential (primary) hypertension: Secondary | ICD-10-CM | POA: Diagnosis not present

## 2016-05-11 DIAGNOSIS — E782 Mixed hyperlipidemia: Secondary | ICD-10-CM | POA: Diagnosis not present

## 2016-05-24 ENCOUNTER — Telehealth: Payer: Self-pay | Admitting: *Deleted

## 2016-05-24 NOTE — Telephone Encounter (Signed)
Patient is having eye surgery on Monday and she is wanting direction on what to do with her coumadin dosing.   Per Dr Marin Olp patient is to stop coumadin 5 days prior to her procedure and then restart the day after her procedure.   Patient is aware of instruction. Confirmed with teach back.

## 2016-05-29 DIAGNOSIS — H029 Unspecified disorder of eyelid: Secondary | ICD-10-CM | POA: Diagnosis not present

## 2016-05-29 DIAGNOSIS — D2312 Other benign neoplasm of skin of left eyelid, including canthus: Secondary | ICD-10-CM | POA: Diagnosis not present

## 2016-05-29 DIAGNOSIS — H0289 Other specified disorders of eyelid: Secondary | ICD-10-CM | POA: Diagnosis not present

## 2016-06-21 ENCOUNTER — Other Ambulatory Visit: Payer: Medicare Other

## 2016-06-21 ENCOUNTER — Ambulatory Visit: Payer: Medicare Other | Admitting: Hematology & Oncology

## 2016-06-23 DIAGNOSIS — Z7901 Long term (current) use of anticoagulants: Secondary | ICD-10-CM | POA: Diagnosis not present

## 2016-06-23 DIAGNOSIS — J209 Acute bronchitis, unspecified: Secondary | ICD-10-CM | POA: Diagnosis not present

## 2016-06-28 DIAGNOSIS — J209 Acute bronchitis, unspecified: Secondary | ICD-10-CM | POA: Diagnosis not present

## 2016-06-28 DIAGNOSIS — Z7901 Long term (current) use of anticoagulants: Secondary | ICD-10-CM | POA: Diagnosis not present

## 2016-07-02 ENCOUNTER — Other Ambulatory Visit: Payer: Self-pay | Admitting: Hematology & Oncology

## 2016-07-02 DIAGNOSIS — I82409 Acute embolism and thrombosis of unspecified deep veins of unspecified lower extremity: Secondary | ICD-10-CM

## 2016-07-05 DIAGNOSIS — R05 Cough: Secondary | ICD-10-CM | POA: Diagnosis not present

## 2016-07-06 ENCOUNTER — Other Ambulatory Visit (HOSPITAL_BASED_OUTPATIENT_CLINIC_OR_DEPARTMENT_OTHER): Payer: PPO

## 2016-07-06 ENCOUNTER — Ambulatory Visit (HOSPITAL_BASED_OUTPATIENT_CLINIC_OR_DEPARTMENT_OTHER): Payer: PPO | Admitting: Family

## 2016-07-06 VITALS — BP 140/66 | HR 61 | Temp 98.3°F | Resp 16 | Wt 147.0 lb

## 2016-07-06 DIAGNOSIS — D6861 Antiphospholipid syndrome: Secondary | ICD-10-CM

## 2016-07-06 DIAGNOSIS — K7581 Nonalcoholic steatohepatitis (NASH): Secondary | ICD-10-CM

## 2016-07-06 DIAGNOSIS — Z86718 Personal history of other venous thrombosis and embolism: Secondary | ICD-10-CM

## 2016-07-06 DIAGNOSIS — K760 Fatty (change of) liver, not elsewhere classified: Secondary | ICD-10-CM | POA: Diagnosis not present

## 2016-07-06 DIAGNOSIS — I82401 Acute embolism and thrombosis of unspecified deep veins of right lower extremity: Secondary | ICD-10-CM

## 2016-07-06 LAB — CBC WITH DIFFERENTIAL (CANCER CENTER ONLY)
BASO#: 0 10*3/uL (ref 0.0–0.2)
BASO%: 0.6 % (ref 0.0–2.0)
EOS%: 4.1 % (ref 0.0–7.0)
Eosinophils Absolute: 0.3 10*3/uL (ref 0.0–0.5)
HCT: 43.6 % (ref 34.8–46.6)
HGB: 15.3 g/dL (ref 11.6–15.9)
LYMPH#: 1.9 10*3/uL (ref 0.9–3.3)
LYMPH%: 29.1 % (ref 14.0–48.0)
MCH: 34.1 pg — ABNORMAL HIGH (ref 26.0–34.0)
MCHC: 35.1 g/dL (ref 32.0–36.0)
MCV: 97 fL (ref 81–101)
MONO#: 0.7 10*3/uL (ref 0.1–0.9)
MONO%: 11 % (ref 0.0–13.0)
NEUT#: 3.6 10*3/uL (ref 1.5–6.5)
NEUT%: 55.2 % (ref 39.6–80.0)
Platelets: 285 10*3/uL (ref 145–400)
RBC: 4.49 10*6/uL (ref 3.70–5.32)
RDW: 13.1 % (ref 11.1–15.7)
WBC: 6.6 10*3/uL (ref 3.9–10.0)

## 2016-07-06 LAB — COMPREHENSIVE METABOLIC PANEL
ALT: 31 U/L (ref 0–55)
AST: 32 U/L (ref 5–34)
Albumin: 3.8 g/dL (ref 3.5–5.0)
Alkaline Phosphatase: 85 U/L (ref 40–150)
Anion Gap: 8 mEq/L (ref 3–11)
BUN: 18.3 mg/dL (ref 7.0–26.0)
CO2: 30 mEq/L — ABNORMAL HIGH (ref 22–29)
Calcium: 9.9 mg/dL (ref 8.4–10.4)
Chloride: 103 mEq/L (ref 98–109)
Creatinine: 0.8 mg/dL (ref 0.6–1.1)
EGFR: 74 mL/min/{1.73_m2} — ABNORMAL LOW (ref >90)
Glucose: 143 mg/dl — ABNORMAL HIGH (ref 70–140)
Potassium: 4.1 mEq/L (ref 3.5–5.1)
Sodium: 141 mEq/L (ref 136–145)
Total Bilirubin: 1.56 mg/dL — ABNORMAL HIGH (ref 0.20–1.20)
Total Protein: 7.1 g/dL (ref 6.4–8.3)

## 2016-07-06 LAB — PROTIME-INR (CHCC SATELLITE)
INR: 2 (ref 2.0–3.5)
Protime: 24 Seconds — ABNORMAL HIGH (ref 10.6–13.4)

## 2016-07-06 NOTE — Progress Notes (Signed)
Hematology and Oncology Follow Up Visit  Laurie Green [id number] 11/22/1945 71 y.o. 07/06/2016   Principle Diagnosis:  1. Antiphospholipid antibody syndrome 2. History of recurrent lower extremity deep venous thrombosis 3. Nonalcoholic steatohepatitis  Current Therapy:   Coumadin to maintain an INR between 2-3    Interim History:  Laurie Green is here today for follow-up. She had a rough time with bronchitis which she states turned into a left lower lobe pneumonia and treated with antibiotics. She started to have symptoms again yesterday of bronchitis so her PCP started her on a z-pack yesterday. She is feeling better. Lung sounds are clear throughout bilaterally.  She is scheduled for a repeat chest xray tomorrow to re-evaluate.  INR is therapeutic at 2.0. She verbalized that she is taking her Coumadin as prescribed.  No episodes of bleeding, bruising or petechiae. No lymphadenopathy found on exam.  No fever, chills, cough, rash, dizziness, SOB, chest pain, palpitations, abdominal pain or changes in bowel or bladder habits.  No swelling or tenderness in her extremities. She has chronic aches and pains due to fibromyalgia that come and go. 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  She is planning to have back surgery in [place] but has not scheduled her appointment. She states that she will be getting another MRI prior to scheduling.  She states that she has abdominal pain on the left and right sides that comes and goes and that she had an MRI which was negative.  She still is not sleeping well at night but states that she does not need a sleep aid. If she is tired during the day she takes a nap.  She states that her blood sugars are much better controlled now that she is on insulin. She is feeling much better.  She has maintained a good appetite and is staying well hydrated. She has occasional nausea with no vomiting. She has had this issue for years and it is unchanged. Her weight is  stable.   Medications:  Allergies as of 07/06/2016      Reactions   Caine-1 [lidocaine Hcl] Anaphylaxis   Morphine Anaphylaxis   Zosyn [piperacillin Sod-tazobactam So] Itching   "scalp itch"-was given Benadryl to counteract.   Codeine Nausea And Vomiting, Other (See Comments)   Dilaudid [hydromorphone Hcl] Nausea And Vomiting   Can take with zofran   Morphine And Related Nausea And Vomiting   Percocet [oxycodone-acetaminophen] Nausea And Vomiting   Tramadol Nausea And Vomiting   Vicodin [hydrocodone-acetaminophen] Nausea And Vomiting      Medication List       Accurate as of 07/06/16 11:38 AM. Always use your most recent med list.          azithromycin 250 MG tablet Commonly known as:  ZITHROMAX   benazepril 20 MG tablet Commonly known as:  LOTENSIN Take 20 mg by mouth every morning.   COUMADIN 7.5 MG tablet Generic drug:  warfarin TAKE 1 TABLET (7.5 MG TOTAL) BY MOUTH DAILY.   glucose 4 GM chewable tablet Chew 16 g by mouth as needed for low blood sugar.   LOTEMAX 0.5 % Gel Generic drug:  Loteprednol Etabonate   OVER THE COUNTER MEDICATION Take 1 tablet by mouth every morning.   PREMARIN vaginal cream Generic drug:  conjugated estrogens Place 1 Applicatorful vaginally 2 (two) times a week. Monday and Wednesday or Thursday.   propranolol 80 MG tablet Commonly known as:  INDERAL Take 80 mg by mouth every morning.   PROVENTIL  HFA 108 (90 Base) MCG/ACT inhaler Generic drug:  albuterol   vitamin B-12 1000 MCG tablet Commonly known as:  CYANOCOBALAMIN Take 1,000 mcg by mouth daily.   Vitamin D3 2000 units capsule Take 2,000 Units by mouth daily.       Allergies:  Allergies  Allergen Reactions  . Caine-1 [Lidocaine Hcl] Anaphylaxis  . Morphine Anaphylaxis  . Zosyn [Piperacillin Sod-Tazobactam So] Itching    "scalp itch"-was given Benadryl to counteract.  . Codeine Nausea And Vomiting and Other (See Comments)  . Dilaudid [Hydromorphone Hcl] Nausea And  Vomiting    Can take with zofran  . Morphine And Related Nausea And Vomiting  . Percocet [Oxycodone-Acetaminophen] Nausea And Vomiting  . Tramadol Nausea And Vomiting  . Vicodin [Hydrocodone-Acetaminophen] Nausea And Vomiting    Past Medical History, Surgical history, Social history, and Family History were reviewed and updated.  Review of Systems: All other 10 point review of systems is negative.   Physical Exam:  weight is 147 lb (66.7 kg). Her oral temperature is 98.3 F (36.8 C). Her blood pressure is 140/66 and her pulse is 61. Her respiration is 16 and oxygen saturation is 97%.   Wt Readings from Last 3 Encounters:  07/06/16 147 lb (66.7 kg)  03/22/16 148 lb 12.8 oz (67.5 kg)  01/19/16 146 lb (66.2 kg)    Ocular: Sclerae unicteric, pupils equal, round and reactive to light Ear-nose-throat: Oropharynx clear, dentition fair Lymphatic: No cervical, supraclavicular or axillary adenopathy Lungs no rales or rhonchi, good excursion bilaterally Heart regular rate and rhythm, no murmur appreciated Abd soft, nontender, positive bowel sounds, splenectomy, no liver tip palpated on exam, no fluid wave MSK no focal spinal tenderness, no joint edema Neuro: non-focal, well-oriented, appropriate affect Breasts: Deferred  Lab Results  Component Value Date   WBC 6.6 07/06/2016   HGB 15.3 07/06/2016   HCT 43.6 07/06/2016   MCV 97 07/06/2016   PLT 285 07/06/2016   Lab Results  Component Value Date   FERRITIN 117 01/19/2016   IRON 83 01/19/2016   TIBC 313 01/19/2016   UIBC 229 01/19/2016   IRONPCTSAT 27 01/19/2016   Lab Results  Component Value Date   RBC 4.49 07/06/2016   No results found for: KPAFRELGTCHN, LAMBDASER, KAPLAMBRATIO No results found for: IGGSERUM, IGA, IGMSERUM No results found for: Odetta Pink, SPEI   Chemistry      Component Value Date/Time   NA 140 03/22/2016 1155   K 4.1 03/22/2016 1155   CL 104  01/15/2016 1931   CL 101 08/26/2014 1408   CO2 28 03/22/2016 1155   BUN 20.0 03/22/2016 1155   CREATININE 0.8 03/22/2016 1155      Component Value Date/Time   CALCIUM 9.7 03/22/2016 1155   ALKPHOS 91 03/22/2016 1155   AST 27 03/22/2016 1155   ALT 29 03/22/2016 1155   BILITOT 1.34 (H) 03/22/2016 1155     Impression and Plan: Laurie Green is 70 yo white female with anti-phospholipid antibody syndrome and history of recurrent lower extremity DVT. She also has hepatic steatosis and NASH.  She has tolerated Coumadin nicely and denies any post phlebitic pain. Her INR remains therapeutic at 2.0 so no change in her coumadin dosage at this time.  She will let us know once she schedule her back surgery and we will adjust her anticoagulation accordingly.  We will continue to follow along with her and plan to see her back in 2 months for repeat  lab work and follow-up.  She will contact our office with any questions or concerns. We can certainly see her sooner if need be.   Eliezer Bottom, NP 3/22/201811:38 AM

## 2016-07-07 ENCOUNTER — Telehealth: Payer: Self-pay | Admitting: *Deleted

## 2016-07-07 ENCOUNTER — Other Ambulatory Visit: Payer: Self-pay | Admitting: Family Medicine

## 2016-07-07 ENCOUNTER — Ambulatory Visit
Admission: RE | Admit: 2016-07-07 | Discharge: 2016-07-07 | Disposition: A | Discharge status: Home / Self Care | Payer: PPO | Source: Ambulatory Visit | Attending: Family Medicine | Admitting: Family Medicine

## 2016-07-07 DIAGNOSIS — R05 Cough: Secondary | ICD-10-CM

## 2016-07-07 DIAGNOSIS — R0989 Other specified symptoms and signs involving the circulatory and respiratory systems: Secondary | ICD-10-CM

## 2016-07-07 DIAGNOSIS — R059 Cough, unspecified: Secondary | ICD-10-CM

## 2016-07-07 NOTE — Telephone Encounter (Addendum)
Patient aware of results  ----- Message from [name], MD sent at 07/06/2016  5:46 PM EDT ----- Call - INR is ok!!  No change in coumadin!!  [name]

## 2016-07-13 DIAGNOSIS — B351 Tinea unguium: Secondary | ICD-10-CM | POA: Diagnosis not present

## 2016-07-13 DIAGNOSIS — E1165 Type 2 diabetes mellitus with hyperglycemia: Secondary | ICD-10-CM | POA: Diagnosis not present

## 2016-08-03 DIAGNOSIS — B351 Tinea unguium: Secondary | ICD-10-CM | POA: Diagnosis not present

## 2016-08-22 DIAGNOSIS — H04123 Dry eye syndrome of bilateral lacrimal glands: Secondary | ICD-10-CM | POA: Diagnosis not present

## 2016-08-22 DIAGNOSIS — H1013 Acute atopic conjunctivitis, bilateral: Secondary | ICD-10-CM | POA: Diagnosis not present

## 2016-09-07 ENCOUNTER — Other Ambulatory Visit (HOSPITAL_BASED_OUTPATIENT_CLINIC_OR_DEPARTMENT_OTHER): Payer: PPO

## 2016-09-07 ENCOUNTER — Ambulatory Visit (HOSPITAL_BASED_OUTPATIENT_CLINIC_OR_DEPARTMENT_OTHER): Payer: PPO | Admitting: Hematology & Oncology

## 2016-09-07 VITALS — BP 138/63 | HR 56 | Temp 98.2°F | Resp 16 | Wt 148.8 lb

## 2016-09-07 DIAGNOSIS — D6861 Antiphospholipid syndrome: Secondary | ICD-10-CM

## 2016-09-07 DIAGNOSIS — K760 Fatty (change of) liver, not elsewhere classified: Secondary | ICD-10-CM | POA: Diagnosis not present

## 2016-09-07 DIAGNOSIS — K7581 Nonalcoholic steatohepatitis (NASH): Secondary | ICD-10-CM

## 2016-09-07 DIAGNOSIS — Z86718 Personal history of other venous thrombosis and embolism: Secondary | ICD-10-CM

## 2016-09-07 DIAGNOSIS — E119 Type 2 diabetes mellitus without complications: Secondary | ICD-10-CM | POA: Diagnosis not present

## 2016-09-07 DIAGNOSIS — K8681 Exocrine pancreatic insufficiency: Secondary | ICD-10-CM

## 2016-09-07 DIAGNOSIS — K7460 Unspecified cirrhosis of liver: Secondary | ICD-10-CM

## 2016-09-07 DIAGNOSIS — I82401 Acute embolism and thrombosis of unspecified deep veins of right lower extremity: Secondary | ICD-10-CM

## 2016-09-07 LAB — CBC WITH DIFFERENTIAL (CANCER CENTER ONLY)
BASO#: 0.1 10*3/uL (ref 0.0–0.2)
BASO%: 1 % (ref 0.0–2.0)
EOS%: 2.7 % (ref 0.0–7.0)
Eosinophils Absolute: 0.2 10*3/uL (ref 0.0–0.5)
HCT: 44.4 % (ref 34.8–46.6)
HGB: 15.6 g/dL (ref 11.6–15.9)
LYMPH#: 2 10*3/uL (ref 0.9–3.3)
LYMPH%: 33.8 % (ref 14.0–48.0)
MCH: 34.1 pg — ABNORMAL HIGH (ref 26.0–34.0)
MCHC: 35.1 g/dL (ref 32.0–36.0)
MCV: 97 fL (ref 81–101)
MONO#: 0.7 10*3/uL (ref 0.1–0.9)
MONO%: 11.8 % (ref 0.0–13.0)
NEUT#: 3 10*3/uL (ref 1.5–6.5)
NEUT%: 50.7 % (ref 39.6–80.0)
Platelets: 264 10*3/uL (ref 145–400)
RBC: 4.58 10*6/uL (ref 3.70–5.32)
RDW: 12.9 % (ref 11.1–15.7)
WBC: 6 10*3/uL (ref 3.9–10.0)

## 2016-09-07 LAB — COMPREHENSIVE METABOLIC PANEL
ALT: 33 U/L (ref 0–55)
AST: 32 U/L (ref 5–34)
Albumin: 3.8 g/dL (ref 3.5–5.0)
Alkaline Phosphatase: 81 U/L (ref 40–150)
Anion Gap: 8 mEq/L (ref 3–11)
BUN: 22.6 mg/dL (ref 7.0–26.0)
CO2: 28 mEq/L (ref 22–29)
Calcium: 9.6 mg/dL (ref 8.4–10.4)
Chloride: 104 mEq/L (ref 98–109)
Creatinine: 0.8 mg/dL (ref 0.6–1.1)
EGFR: 72 mL/min/{1.73_m2} — ABNORMAL LOW (ref >90)
Glucose: 141 mg/dl — ABNORMAL HIGH (ref 70–140)
Potassium: 4.1 mEq/L (ref 3.5–5.1)
Sodium: 140 mEq/L (ref 136–145)
Total Bilirubin: 1.34 mg/dL — ABNORMAL HIGH (ref 0.20–1.20)
Total Protein: 6.9 g/dL (ref 6.4–8.3)

## 2016-09-07 LAB — PROTIME-INR (CHCC SATELLITE)
INR: 2.4 (ref 2.0–3.5)
Protime: 28.8 Seconds — ABNORMAL HIGH (ref 10.6–13.4)

## 2016-09-07 MED ORDER — PANCRELIPASE (LIP-PROT-AMYL) 24000-76000 UNITS PO CPEP: 1.0000 | ORAL_CAPSULE | Freq: Two times a day (BID) | ORAL | 3 refills | Status: DC

## 2016-09-07 NOTE — Progress Notes (Signed)
Hematology and Oncology Follow Up Visit  STEPHANIEMARIE STOFFEL [id number] Sep 28, 1945 71 y.o. 09/07/2016   Principle Diagnosis:  1. Antiphospholipid antibody syndrome. 2. History of recurrent lower extremity deep venous thrombosis. 3. Nonalcoholic steatohepatitis.  Current Therapy:   Coumadin to maintain an INR between 2-3.     Interim History:  Ms.  Kohlman is back for followup. She actually looks quite good. She now is taking a holistic approach to her health care.  She says that her blood sugars have been doing a little bit better.  She has had no fever. She has had no cough.  She still has some tenderness over on the bright side of her abdomen.  There's been no problems with nausea or vomiting. She's had no obvious change in bowel or bladder habits.   I think that she may have some issues with pancreatic insufficiency. I am going to try her on some Creon. We'll see if this can help.   Her performance status is ECOG 1.   Medications:  Current Outpatient Prescriptions:  .  azithromycin (ZITHROMAX) 250 MG tablet, , Disp: , Rfl:  .  benazepril (LOTENSIN) 20 MG tablet, Take 20 mg by mouth every morning. , Disp: , Rfl:  .  Cholecalciferol (VITAMIN D3) 2000 UNITS capsule, Take 2,000 Units by mouth daily., Disp: , Rfl:  .  COUMADIN 7.5 MG tablet, TAKE 1 TABLET (7.5 MG TOTAL) BY MOUTH DAILY., Disp: 90 tablet, Rfl: 3 .  glucose 4 GM chewable tablet, Chew 16 g by mouth as needed for low blood sugar., Disp: , Rfl:  .  LOTEMAX 0.5 % GEL, , Disp: , Rfl:  .  OVER THE COUNTER MEDICATION, Take 1 tablet by mouth every morning. , Disp: , Rfl:  .  PREMARIN vaginal cream, Place 1 Applicatorful vaginally 2 (two) times a week. Monday and Wednesday or Thursday., Disp: , Rfl: 3 .  propranolol (INDERAL) 80 MG tablet, Take 80 mg by mouth every morning. , Disp: , Rfl:  .  vitamin B-12 (CYANOCOBALAMIN) 1000 MCG tablet, Take 1,000 mcg by mouth daily., Disp: , Rfl:   Allergies:  Allergies  Allergen  Reactions  . Caine-1 [Lidocaine Hcl] Anaphylaxis  . Morphine Anaphylaxis  . Zosyn [Piperacillin Sod-Tazobactam So] Itching    "scalp itch"-was given Benadryl to counteract.  . Codeine Nausea And Vomiting and Other (See Comments)  . Dilaudid [Hydromorphone Hcl] Nausea And Vomiting    Can take with zofran  . Morphine And Related Nausea And Vomiting  . Percocet [Oxycodone-Acetaminophen] Nausea And Vomiting  . Tramadol Nausea And Vomiting  . Vicodin [Hydrocodone-Acetaminophen] Nausea And Vomiting    Past Medical History, Surgical history, Social history, and Family History were reviewed and updated.  Review of Systems: As above  Physical Exam:  weight is 148 lb 12.8 oz (67.5 kg). Her oral temperature is 98.2 F (36.8 C). Her blood pressure is 138/63 and her pulse is 56 (abnormal). Her respiration is 16 and oxygen saturation is 97%.   Well-developed and well-nourished white female in no obvious distress. Head and neck exam shows no ocular or oral lesions. There are no palpable cervical or supraclavicular this. Lungs are clear bilateral. Cardiac exam regular rate and rhythm with no murmurs rubs or bruits. Abdomen is soft. 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 scar. She has no hepatomegaly. There is no fluid wave. Back exam shows no tenderness over spine ribs or hips. Extremities shows no clubbing cyanosis or edema. Skin exam no rashes, ecchymoses or  petechia. Neurological exam is nonfocal. Lab Results  Component Value Date   WBC 6.0 09/07/2016   HGB 15.6 09/07/2016   HCT 44.4 09/07/2016   MCV 97 09/07/2016   PLT 264 09/07/2016     Chemistry      Component Value Date/Time   NA 141 07/06/2016 1108   K 4.1 07/06/2016 1108   CL 104 01/15/2016 1931   CL 101 08/26/2014 1408   CO2 30 (H) 07/06/2016 1108   BUN 18.3 07/06/2016 1108   CREATININE 0.8 07/06/2016 1108      Component Value Date/Time   CALCIUM 9.9 07/06/2016 1108   ALKPHOS 85 07/06/2016 1108   AST 32  07/06/2016 1108   ALT 31 07/06/2016 1108   BILITOT 1.56 (H) 07/06/2016 1108      INR is 2.4 Impression and Plan: Ms. Lanagan is 71 year old white female with the anti-phospholipid antibody syndrome. This really has not been a problem for her. The bigger issue has been the hepatic problems and her diabetes. She has steatosis. She has NASH.  Her INR is perfect. There is no need to make a change in her Coumadin.  We will plan to get her back in one month. I want to make sure that we are meticulous with our follow-up. Hopefully, the Creon will help.  [name], MD 5/24/201810:29 AM

## 2016-09-25 DIAGNOSIS — H04123 Dry eye syndrome of bilateral lacrimal glands: Secondary | ICD-10-CM | POA: Diagnosis not present

## 2016-10-04 ENCOUNTER — Emergency Department (HOSPITAL_COMMUNITY): Payer: PPO

## 2016-10-04 ENCOUNTER — Encounter (HOSPITAL_COMMUNITY): Payer: Self-pay | Admitting: Cardiology

## 2016-10-04 ENCOUNTER — Emergency Department (HOSPITAL_COMMUNITY)
Admission: EM | Admit: 2016-10-04 | Discharge: 2016-10-04 | Disposition: A | Discharge status: Home / Self Care | Payer: PPO | Attending: Emergency Medicine | Admitting: Emergency Medicine

## 2016-10-04 DIAGNOSIS — S6992XA Unspecified injury of left wrist, hand and finger(s), initial encounter: Secondary | ICD-10-CM | POA: Diagnosis not present

## 2016-10-04 DIAGNOSIS — R0789 Other chest pain: Secondary | ICD-10-CM | POA: Diagnosis not present

## 2016-10-04 DIAGNOSIS — S61412A Laceration without foreign body of left hand, initial encounter: Secondary | ICD-10-CM | POA: Diagnosis not present

## 2016-10-04 DIAGNOSIS — W260XXA Contact with knife, initial encounter: Secondary | ICD-10-CM | POA: Diagnosis not present

## 2016-10-04 DIAGNOSIS — E119 Type 2 diabetes mellitus without complications: Secondary | ICD-10-CM | POA: Insufficient documentation

## 2016-10-04 DIAGNOSIS — R52 Pain, unspecified: Secondary | ICD-10-CM | POA: Diagnosis not present

## 2016-10-04 DIAGNOSIS — M79642 Pain in left hand: Secondary | ICD-10-CM | POA: Diagnosis not present

## 2016-10-04 DIAGNOSIS — Y929 Unspecified place or not applicable: Secondary | ICD-10-CM | POA: Insufficient documentation

## 2016-10-04 DIAGNOSIS — Z79899 Other long term (current) drug therapy: Secondary | ICD-10-CM | POA: Diagnosis not present

## 2016-10-04 DIAGNOSIS — Y999 Unspecified external cause status: Secondary | ICD-10-CM | POA: Insufficient documentation

## 2016-10-04 DIAGNOSIS — J45909 Unspecified asthma, uncomplicated: Secondary | ICD-10-CM | POA: Insufficient documentation

## 2016-10-04 DIAGNOSIS — R079 Chest pain, unspecified: Secondary | ICD-10-CM | POA: Diagnosis not present

## 2016-10-04 DIAGNOSIS — R0602 Shortness of breath: Secondary | ICD-10-CM | POA: Diagnosis not present

## 2016-10-04 DIAGNOSIS — I10 Essential (primary) hypertension: Secondary | ICD-10-CM | POA: Diagnosis not present

## 2016-10-04 DIAGNOSIS — Y939 Activity, unspecified: Secondary | ICD-10-CM | POA: Diagnosis not present

## 2016-10-04 LAB — BASIC METABOLIC PANEL
Anion gap: 8 (ref 5–15)
BUN: 16 mg/dL (ref 6–20)
CO2: 28 mmol/L (ref 22–32)
Calcium: 9.3 mg/dL (ref 8.9–10.3)
Chloride: 103 mmol/L (ref 101–111)
Creatinine, Ser: 0.66 mg/dL (ref 0.44–1.00)
GFR calc Af Amer: >60 mL/min (ref >60)
GFR calc non Af Amer: >60 mL/min (ref >60)
Glucose, Bld: 145 mg/dL — ABNORMAL HIGH (ref 65–99)
Potassium: 3.7 mmol/L (ref 3.5–5.1)
Sodium: 139 mmol/L (ref 135–145)

## 2016-10-04 LAB — CBC
HCT: 43.3 % (ref 36.0–46.0)
Hemoglobin: 15.1 g/dL — ABNORMAL HIGH (ref 12.0–15.0)
MCH: 33.7 pg (ref 26.0–34.0)
MCHC: 34.9 g/dL (ref 30.0–36.0)
MCV: 96.7 fL (ref 78.0–100.0)
Platelets: 296 10*3/uL (ref 150–400)
RBC: 4.48 MIL/uL (ref 3.87–5.11)
RDW: 13 % (ref 11.5–15.5)
WBC: 6.3 10*3/uL (ref 4.0–10.5)

## 2016-10-04 LAB — PROTIME-INR
INR: 1.86
Prothrombin Time: 21.7 seconds — ABNORMAL HIGH (ref 11.4–15.2)

## 2016-10-04 LAB — TROPONIN I: Troponin I: <0.03 ng/mL (ref <0.03)

## 2016-10-04 MED ORDER — TETRACAINE HCL 1 % IJ SOLN
20.0000 mg | Freq: Once | INTRAMUSCULAR | Status: AC
  Administered 2016-10-04: 2 mL via INTRADERMAL
  Filled 2016-10-04: qty 2

## 2016-10-04 NOTE — ED Provider Notes (Signed)
[facility] DEPT Provider Note   CSN: [id number] Arrival date & time: 10/04/16  1322     History   Chief Complaint Chief Complaint  Patient presents with  . Laceration  . Chest Pain    HPI Laurie Green is a 71 y.o. female.  HPI  71 year old female presents with a left palm laceration after accidentally stabbing herself with a knife. She was trying to open a package of frozen hamburgers and was using the knife to separate the 2 hamburgers. The knife slid down and punctured the palm of her hand. She is on warfarin for a blood dyscrasia and so the bleeding was initially difficult to control. She also saw something protruding from the wound was concerned about muscle. Thus she called 911. On the way here she started feeling some chest pressure and shortness of breath. She states she has had this before with allergies and thinks it is anxiety related as she states she was truly worried she was going to bleed out. No weakness or numbness. The knife was intact.  Past Medical History:  Diagnosis Date  . 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 Antiphospholipid antibody with hypercoagulable state ([place]) 11/07/2012  . Arthritis   . Asthma    well controlled, no rescue inhaler used  . Blood dyscrasia    anticardiolipid syndrome  . Blood transfusion    age 22 yr old-none since  . Celiac disease   . Diabetes mellitus   . Diverticulitis   . DVT (deep venous thrombosis) ([place]) 05/01/2011  . Fibromyalgia   . GERD (gastroesophageal reflux disease)   . Heart murmur   . Hypertension   . NASH (nonalcoholic steatohepatitis) 05/01/2011  . Nausea & vomiting    11-17-14 "nausea and vomiting after meals" at present discussed with Dr. Oletta Lamas office per pt.  Marland Kitchen PONV (postoperative nausea and vomiting)   . Portal hypertension ([place])   . Stroke ([place])    tia  . TIA (transient ischemic attack)   . Varices 05/01/2011  . Varices, esophageal ([place])   . Varices,  gastric     Patient Active Problem List   Diagnosis Date Noted  . Antiphospholipid antibody with hypercoagulable state ([place]) 11/07/2012  . Transaminitis 09/07/2012  . Fever, unspecified 09/07/2012  . Abdominal pain 09/04/2012  . Subtherapeutic international normalized ratio (INR) 09/04/2012  . Diabetes mellitus ([place]) 09/04/2012  . Leukocytosis, unspecified 09/04/2012  . Varices, esophageal ([place]) 09/04/2012  . Transaminasemia 09/04/2012  . DVT (deep venous thrombosis) ([place]) 05/01/2011  . Varices 05/01/2011  . NASH (nonalcoholic steatohepatitis) 05/01/2011  . Incisional hernia without mention of obstruction or gangrene 02/14/2011    Past Surgical History:  Procedure Laterality Date  . ABDOMINAL HYSTERECTOMY    . ANKLE FRACTURE SURGERY Left    retained hardware  . CHOLECYSTECTOMY    . COLON SURGERY     hx. diverticulitis  . COLONOSCOPY N/A 11/23/2014   Procedure: COLONOSCOPY;  Surgeon: [name], MD;  Location: [facility] ENDOSCOPY;  Service: Endoscopy;  Laterality: N/A;  . ESOPHAGOGASTRODUODENOSCOPY  04/28/2011   Procedure: ESOPHAGOGASTRODUODENOSCOPY (EGD);  Surgeon: [name]., MD;  Location: [facility] ENDOSCOPY;  Service: Endoscopy;  Laterality: N/A;  . ESOPHAGOGASTRODUODENOSCOPY N/A 11/23/2014   Procedure: ESOPHAGOGASTRODUODENOSCOPY (EGD);  Surgeon: [name], MD;  Location: [facility] ENDOSCOPY;  Service: Endoscopy;  Laterality: N/A;  . EUS N/A 09/25/2012   Procedure: ESOPHAGEAL ENDOSCOPIC ULTRASOUND (EUS) RADIAL;  Surgeon: Arta Silence, MD;  Location: [facility] ENDOSCOPY;  Service: Endoscopy;  Laterality:  N/A;  . HERNIA REPAIR     luq incisional  . SPLENECTOMY, TOTAL    . TONSILLECTOMY      OB History    No data available       Home Medications    Prior to Admission medications   Medication Sig Start Date End Date Taking? Authorizing Provider  benazepril (LOTENSIN) 20 MG tablet Take 20 mg by mouth every morning.    Yes [provider]  Cholecalciferol (VITAMIN D3) 2000  UNITS capsule Take 2,000 Units by mouth daily.   Yes [provider]  COUMADIN 7.5 MG tablet TAKE 1 TABLET (7.5 MG TOTAL) BY MOUTH DAILY. 07/03/16  Yes Ennever, Rudell Cobb, MD  glucose 4 GM chewable tablet Chew 16 g by mouth as needed for low blood sugar.   Yes [provider]  JUBLIA 10 % SOLN Apply 1 drop topically daily. 09/18/16  Yes [provider]  LOTEMAX 0.5 % GEL  04/27/16  Yes [provider]  Pancrelipase, Lip-Prot-Amyl, 24000-76000 units CPEP Take 1 capsule (24,000 Units total) by mouth 2 (two) times daily at 8 am and 10 pm. 09/07/16  Yes Ennever, Rudell Cobb, MD  PREMARIN vaginal cream Place 1 Applicatorful vaginally 2 (two) times a week. Monday and Wednesday or Thursday. 08/18/14  Yes [provider]  propranolol (INDERAL) 80 MG tablet Take 80 mg by mouth every morning.    Yes [provider]  RESTASIS MULTIDOSE 0.05 % ophthalmic emulsion Place 1 drop into both eyes at bedtime. 09/25/16  Yes [provider]  vitamin B-12 (CYANOCOBALAMIN) 1000 MCG tablet Take 1,000 mcg by mouth daily.   Yes [provider]  azithromycin (ZITHROMAX) 250 MG tablet  07/05/16   [provider]  OVER THE COUNTER MEDICATION Take 1 tablet by mouth every morning.     [provider]    Family History History reviewed. No pertinent family history.  Social History Social History  Substance Use Topics  . Smoking status: Never Smoker  . Smokeless tobacco: Never Used     Comment: never used tobacco  . Alcohol use No     Allergies   Caine-1 [lidocaine hcl]; Morphine; Zosyn [piperacillin sod-tazobactam so]; Codeine; Dilaudid [hydromorphone hcl]; Morphine and related; Percocet [oxycodone-acetaminophen]; Tramadol; and Vicodin [hydrocodone-acetaminophen]   Review of Systems Review of Systems  Respiratory: Positive for shortness of breath.   Cardiovascular: Positive for chest pain.  Skin: Positive for wound.  Neurological:  Negative for weakness and numbness.  All other systems reviewed and are negative.    Physical Exam Updated Vital Signs BP (!) 165/95 (BP Location: Left Arm)   Pulse 64   Temp 98.4 F (36.9 C) (Oral)   Resp 11   Ht 5' 4"  (1.626 m)   Wt 65.8 kg (145 lb)   SpO2 95%   BMI 24.89 kg/m   Physical Exam  Constitutional: She is oriented to person, place, and time. She appears well-developed and well-nourished. No distress.  HENT:  Head: Normocephalic and atraumatic.  Right Ear: External ear normal.  Left Ear: External ear normal.  Nose: Nose normal.  Eyes: Right eye exhibits no discharge. Left eye exhibits no discharge.  Cardiovascular: Normal rate, regular rhythm and normal heart sounds.   Pulmonary/Chest: Effort normal and breath sounds normal.  Abdominal: Soft. There is no tenderness.  Musculoskeletal:       Left hand: She exhibits laceration. Normal sensation noted. Normal strength noted.       Hands: Full but painful ROM  of hand. Normal sensation  Neurological: She is alert and oriented to person, place, and time.  Skin: Skin is warm and dry. She is not diaphoretic.  Nursing note and vitals reviewed.    ED Treatments / Results  Labs (all labs ordered are listed, but only abnormal results are displayed) Labs Reviewed  BASIC METABOLIC PANEL - Abnormal; Notable for the following:       Result Value   Glucose, Bld 145 (*)    All other components within normal limits  CBC - Abnormal; Notable for the following:    Hemoglobin 15.1 (*)    All other components within normal limits  TROPONIN I  PROTIME-INR    EKG  EKG Interpretation  Date/Time:  Wednesday October 04 2016 13:31:40 EDT Ventricular Rate:  64 PR Interval:    QRS Duration: 101 QT Interval:  427 QTC Calculation: 441 R Axis:   24 Text Interpretation:  Sinus rhythm Low voltage, precordial leads no significant change since 2017 Confirmed by Sherwood Gambler [phone]) on 10/04/2016 1:56:46 PM       Radiology Dg  Chest 2 View  Result Date: 10/04/2016 CLINICAL DATA:  Chest pain and shortness of breath. Stab wound to the left hand. EXAM: CHEST  2 VIEW COMPARISON:  07/07/2016 FINDINGS: The heart is at the upper limits of normal in size. Pulmonary vascularity is normal. Lungs are clear of infiltrates and effusions. Tiny area of scarring in the lingula. IMPRESSION: No active cardiopulmonary disease. Electronically Signed   By: Lorriane Shire M.D.   On: 10/04/2016 14:34   Dg Hand Complete Left  Result Date: 10/04/2016 CLINICAL DATA:  Left hand laceration by a kitchen knife. Initial encounter. EXAM: LEFT HAND - COMPLETE 3+ VIEW COMPARISON:  None. FINDINGS: The bones are osteopenic. No acute fracture or dislocation is identified. The trapezium appears flattened and deficient or partially resected with arthropathic changes involving the base of the first metacarpal. Mild TFCC chondrocalcinosis is noted. No radiopaque foreign body is seen. IMPRESSION: No evidence of acute osseous abnormality. Electronically Signed   By: Logan Bores M.D.   On: 10/04/2016 14:40    Procedures .Marland KitchenLaceration Repair Date/Time: 10/04/2016 3:44 PM Performed by: Sherwood Gambler Authorized by: Sherwood Gambler   Consent:    Consent obtained:  Verbal   Consent given by:  Patient Anesthesia (see MAR for exact dosages):    Anesthesia method:  Local infiltration   Local anesthetic: tetracaine 1% w/o epi - 56m diluted in 3 mL saline. Laceration details:    Location:  Hand   Hand location:  L palm   Length (cm):  1 Repair type:    Repair type:  Simple Pre-procedure details:    Preparation:  Patient was prepped and draped in usual sterile fashion and imaging obtained to evaluate for foreign bodies Exploration:    Wound extent: no foreign bodies/material noted, no muscle damage noted, no nerve damage noted, no tendon damage noted and no underlying fracture noted   Treatment:    Amount of cleaning:  Standard   Irrigation solution:  Sterile  saline   Irrigation method:  Syringe Skin repair:    Repair method:  Sutures   Suture size:  4-0   Suture material:  Nylon   Number of sutures:  2 Approximation:    Approximation:  Close   Vermilion border: well-aligned   Post-procedure details:    Patient tolerance of procedure:  Tolerated well, no immediate complications   (including critical care time)  Medications Ordered in ED  Medications - No data to display   Initial Impression / Assessment and Plan / ED Course  I have reviewed the triage vital signs and the nursing notes.  Pertinent labs & imaging results that were available during my care of the patient were reviewed by me and considered in my medical decision making (see chart for details).     Patient has an anaphylactic allergy to lidocaine. I discussed with pharmacy, who recommends tetracaine locally injected. Recommends diluting in 2-3 milliliters of saline. This worked well. Wound was irrigated with saline. Her tetanus is up-to-date, within the last 5 years. Otherwise her chest pain is atypical and was probably related to the stress of the situation. I offered her further workup as she declines. She will follow-up with PCP for suture removal. Hemostasis achieved after wound care.  Final Clinical Impressions(s) / ED Diagnoses   Final diagnoses:  Laceration of left palm without complication, initial encounter  Nonspecific chest pain    New Prescriptions New Prescriptions   No medications on file     Sherwood Gambler, MD 10/04/16 [phone]

## 2016-10-04 NOTE — ED Notes (Signed)
Pt c/o left hand swelling and having 3 rings that she can not remove.  Lotion applied to hand and able to remove rings.  Pt has rings at bedside in urine cup with name labeled on cup.

## 2016-10-04 NOTE — ED Triage Notes (Signed)
Called EMS for laceration to left hand.  Cut hand with kitchen knife .  Pt started having chest pain and sob PTA .  Pt denies sob upon arrival to ED.

## 2016-11-01 ENCOUNTER — Other Ambulatory Visit: Payer: Self-pay | Admitting: Gastroenterology

## 2016-11-01 DIAGNOSIS — I8500 Esophageal varices without bleeding: Secondary | ICD-10-CM | POA: Diagnosis not present

## 2016-11-01 DIAGNOSIS — R1084 Generalized abdominal pain: Secondary | ICD-10-CM

## 2016-11-01 DIAGNOSIS — K219 Gastro-esophageal reflux disease without esophagitis: Secondary | ICD-10-CM | POA: Diagnosis not present

## 2016-11-01 DIAGNOSIS — K7469 Other cirrhosis of liver: Secondary | ICD-10-CM | POA: Diagnosis not present

## 2016-11-01 DIAGNOSIS — Z7901 Long term (current) use of anticoagulants: Secondary | ICD-10-CM | POA: Diagnosis not present

## 2016-11-01 DIAGNOSIS — E119 Type 2 diabetes mellitus without complications: Secondary | ICD-10-CM | POA: Diagnosis not present

## 2016-11-01 DIAGNOSIS — Z794 Long term (current) use of insulin: Secondary | ICD-10-CM | POA: Diagnosis not present

## 2016-11-01 DIAGNOSIS — I81 Portal vein thrombosis: Secondary | ICD-10-CM | POA: Diagnosis not present

## 2016-11-01 DIAGNOSIS — K766 Portal hypertension: Secondary | ICD-10-CM | POA: Diagnosis not present

## 2016-11-01 DIAGNOSIS — K900 Celiac disease: Secondary | ICD-10-CM | POA: Diagnosis not present

## 2016-11-01 DIAGNOSIS — D6861 Antiphospholipid syndrome: Secondary | ICD-10-CM | POA: Diagnosis not present

## 2016-11-03 ENCOUNTER — Encounter (HOSPITAL_COMMUNITY): Payer: Self-pay | Admitting: *Deleted

## 2016-11-03 NOTE — Progress Notes (Addendum)
Laurie Green has Type II DM- she reports that CBG is usually under 200, occasional drops low. ibuprofen instructed patient to take 1/2 of Lantus dose the evening before OR and to check CBG the morning of surgery, when she wakes up an d every 2 hours untis she arrives at the hospital. I instructed patient to check CBG to check CBG and if it is less than 70 to treat it with Glucose Gel, Glucose tablets.. I instructed patient to recheck CBG in 15 minutes and if CBG is not greater than 70, to  Call [phone] (endo). If it is before pre-op opens to retreat as before and recheck CBG in 15 minutes. I told patient to make note of time that liquid is taken and amount, that surgical time may have to be adjusted.

## 2016-11-06 ENCOUNTER — Ambulatory Visit (HOSPITAL_COMMUNITY): Payer: PPO | Admitting: Anesthesiology

## 2016-11-06 ENCOUNTER — Encounter (HOSPITAL_COMMUNITY)
Admission: RE | Disposition: A | Discharge status: Home / Self Care | Payer: Self-pay | Source: Ambulatory Visit | Attending: Gastroenterology

## 2016-11-06 ENCOUNTER — Encounter (HOSPITAL_COMMUNITY): Payer: Self-pay | Admitting: Gastroenterology

## 2016-11-06 ENCOUNTER — Ambulatory Visit (HOSPITAL_COMMUNITY)
Admission: RE | Admit: 2016-11-06 | Discharge: 2016-11-06 | Disposition: A | Discharge status: Home / Self Care | Payer: PPO | Source: Ambulatory Visit | Attending: Gastroenterology | Admitting: Gastroenterology

## 2016-11-06 DIAGNOSIS — I10 Essential (primary) hypertension: Secondary | ICD-10-CM | POA: Insufficient documentation

## 2016-11-06 DIAGNOSIS — K766 Portal hypertension: Secondary | ICD-10-CM | POA: Insufficient documentation

## 2016-11-06 DIAGNOSIS — K219 Gastro-esophageal reflux disease without esophagitis: Secondary | ICD-10-CM | POA: Insufficient documentation

## 2016-11-06 DIAGNOSIS — I81 Portal vein thrombosis: Secondary | ICD-10-CM | POA: Diagnosis not present

## 2016-11-06 DIAGNOSIS — K2950 Unspecified chronic gastritis without bleeding: Secondary | ICD-10-CM | POA: Insufficient documentation

## 2016-11-06 DIAGNOSIS — Z794 Long term (current) use of insulin: Secondary | ICD-10-CM | POA: Diagnosis not present

## 2016-11-06 DIAGNOSIS — E119 Type 2 diabetes mellitus without complications: Secondary | ICD-10-CM | POA: Insufficient documentation

## 2016-11-06 DIAGNOSIS — M797 Fibromyalgia: Secondary | ICD-10-CM | POA: Insufficient documentation

## 2016-11-06 DIAGNOSIS — Z8673 Personal history of transient ischemic attack (TIA), and cerebral infarction without residual deficits: Secondary | ICD-10-CM | POA: Diagnosis not present

## 2016-11-06 DIAGNOSIS — K2970 Gastritis, unspecified, without bleeding: Secondary | ICD-10-CM | POA: Diagnosis not present

## 2016-11-06 DIAGNOSIS — K7581 Nonalcoholic steatohepatitis (NASH): Secondary | ICD-10-CM | POA: Insufficient documentation

## 2016-11-06 DIAGNOSIS — M1990 Unspecified osteoarthritis, unspecified site: Secondary | ICD-10-CM | POA: Diagnosis not present

## 2016-11-06 DIAGNOSIS — D6861 Antiphospholipid syndrome: Secondary | ICD-10-CM | POA: Insufficient documentation

## 2016-11-06 DIAGNOSIS — J45909 Unspecified asthma, uncomplicated: Secondary | ICD-10-CM | POA: Insufficient documentation

## 2016-11-06 DIAGNOSIS — K3189 Other diseases of stomach and duodenum: Secondary | ICD-10-CM | POA: Diagnosis not present

## 2016-11-06 DIAGNOSIS — K7460 Unspecified cirrhosis of liver: Secondary | ICD-10-CM | POA: Diagnosis not present

## 2016-11-06 DIAGNOSIS — I8500 Esophageal varices without bleeding: Secondary | ICD-10-CM | POA: Insufficient documentation

## 2016-11-06 DIAGNOSIS — K921 Melena: Secondary | ICD-10-CM | POA: Diagnosis not present

## 2016-11-06 DIAGNOSIS — K2980 Duodenitis without bleeding: Secondary | ICD-10-CM | POA: Insufficient documentation

## 2016-11-06 DIAGNOSIS — R195 Other fecal abnormalities: Secondary | ICD-10-CM | POA: Diagnosis not present

## 2016-11-06 DIAGNOSIS — F419 Anxiety disorder, unspecified: Secondary | ICD-10-CM | POA: Insufficient documentation

## 2016-11-06 DIAGNOSIS — Z79899 Other long term (current) drug therapy: Secondary | ICD-10-CM | POA: Insufficient documentation

## 2016-11-06 DIAGNOSIS — R109 Unspecified abdominal pain: Secondary | ICD-10-CM | POA: Diagnosis not present

## 2016-11-06 DIAGNOSIS — Z7901 Long term (current) use of anticoagulants: Secondary | ICD-10-CM | POA: Insufficient documentation

## 2016-11-06 DIAGNOSIS — R1013 Epigastric pain: Secondary | ICD-10-CM | POA: Diagnosis not present

## 2016-11-06 DIAGNOSIS — Z885 Allergy status to narcotic agent status: Secondary | ICD-10-CM | POA: Diagnosis not present

## 2016-11-06 DIAGNOSIS — Z880 Allergy status to penicillin: Secondary | ICD-10-CM | POA: Diagnosis not present

## 2016-11-06 HISTORY — DX: Pneumonia, unspecified organism: J18.9

## 2016-11-06 HISTORY — DX: Allergy status to unspecified drugs, medicaments and biological substances: Z88.9

## 2016-11-06 HISTORY — DX: Claustrophobia: F40.240

## 2016-11-06 HISTORY — PX: ESOPHAGOGASTRODUODENOSCOPY (EGD) WITH PROPOFOL: SHX5813

## 2016-11-06 SURGERY — ESOPHAGOGASTRODUODENOSCOPY (EGD) WITH PROPOFOL
Anesthesia: Monitor Anesthesia Care

## 2016-11-06 SURGERY — EGD (ESOPHAGOGASTRODUODENOSCOPY)
Anesthesia: Monitor Anesthesia Care

## 2016-11-06 MED ORDER — ONDANSETRON HCL 4 MG/2ML IJ SOLN
INTRAMUSCULAR | Status: DC | PRN
Start: 2016-11-06 — End: 2016-11-06
  Administered 2016-11-06: 4 mg via INTRAVENOUS

## 2016-11-06 MED ORDER — PROPOFOL 10 MG/ML IV BOLUS
INTRAVENOUS | Status: DC | PRN
  Administered 2016-11-06 (×3): 20 mg via INTRAVENOUS
  Administered 2016-11-06: 30 mg via INTRAVENOUS

## 2016-11-06 MED ORDER — LACTATED RINGERS IV SOLN
INTRAVENOUS | Status: DC | PRN
  Administered 2016-11-06: 11:00:00 via INTRAVENOUS

## 2016-11-06 MED ORDER — PROPOFOL 500 MG/50ML IV EMUL
INTRAVENOUS | Status: DC | PRN
  Administered 2016-11-06: 125 ug/kg/min via INTRAVENOUS

## 2016-11-06 MED ORDER — LACTATED RINGERS IV SOLN
INTRAVENOUS | Status: DC
  Administered 2016-11-06: 11:00:00 via INTRAVENOUS

## 2016-11-06 MED ORDER — SODIUM CHLORIDE 0.9 % IV SOLN: INTRAVENOUS | Status: DC

## 2016-11-06 SURGICAL SUPPLY — 14 items

## 2016-11-06 NOTE — Discharge Instructions (Signed)
YOU HAD AN ENDOSCOPIC PROCEDURE TODAY: Refer to the procedure report and other information in the discharge instructions given to you for any specific questions about what was found during the examination. If this information does not answer your questions, please call [facility] office at [phone] to clarify.   YOU SHOULD EXPECT: Some feelings of bloating in the abdomen. Passage of more gas than usual. Walking can help get rid of the air that was put into your GI tract during the procedure and reduce the bloating. If you had a lower endoscopy (such as a colonoscopy or flexible sigmoidoscopy) you may notice spotting of blood in your stool or on the toilet paper. Some abdominal soreness may be present for a day or two, also.  DIET: Your first meal following the procedure should be a light meal and then it is ok to progress to your normal diet. A half-sandwich or bowl of soup is an example of a good first meal. Heavy or fried foods are harder to digest and may make you feel nauseous or bloated. Drink plenty of fluids but you should avoid alcoholic beverages for 24 hours. If you had a esophageal dilation, please see attached instructions for diet.   ACTIVITY: Your care partner should take you home directly after the procedure. You should plan to take it easy, moving slowly for the rest of the day. You can resume normal activity the day after the procedure however YOU SHOULD NOT DRIVE, use power tools, machinery or perform tasks that involve climbing or major physical exertion for 24 hours (because of the sedation medicines used during the test).   SYMPTOMS TO REPORT IMMEDIATELY: A gastroenterologist can be reached at any hour. Please call [phone]  for any of the following symptoms:  Following lower endoscopy (colonoscopy, flexible sigmoidoscopy) Excessive amounts of blood in the stool  Significant tenderness, worsening of abdominal pains  Swelling of the abdomen that is new, acute  Fever of 100 or  higher  Following upper endoscopy (EGD, EUS, ERCP, esophageal dilation) Vomiting of blood or coffee ground material  New, significant abdominal pain  New, significant chest pain or pain under the shoulder blades  Painful or persistently difficult swallowing  New shortness of breath  Black, tarry-looking or red, bloody stools  FOLLOW UP:  If any biopsies were taken you will be contacted by phone or by letter within the next 1-3 weeks. Call [phone]  if you have not heard about the biopsies in 3 weeks.  Please also call with any specific questions about appointments or follow up tests.    Start OTC prilosec(omeprazole) 20 mg twice a day before meals

## 2016-11-06 NOTE — Transfer of Care (Signed)
Immediate Anesthesia Transfer of Care Note  Patient: Laurie Green  Procedure(s) Performed: Procedure(s): ESOPHAGOGASTRODUODENOSCOPY (EGD) WITH PROPOFOL (N/A)  Patient Location: Endoscopy Unit  Anesthesia Type:MAC  Level of Consciousness: awake and patient cooperative  Airway & Oxygen Therapy: Patient Spontanous Breathing  Post-op Assessment: Report given to RN and Post -op Vital signs reviewed and stable  Post vital signs: Reviewed and stable  Last Vitals:  Vitals:   11/06/16 1018 11/06/16 1118  BP: (!) 154/74 114/60  Pulse: 60 61  Resp: 17 17  Temp: 36.6 C     Last Pain:  Vitals:   11/06/16 1118  TempSrc: Oral  PainSc:          Complications: No apparent anesthesia complications

## 2016-11-06 NOTE — Op Note (Signed)
[facility] Patient Name: Laurie Green Procedure Date : 11/06/2016 MRN: [id number] Attending MD: Nancy Fetter Dr., MD Date of Birth: 1945/06/21 CSN: [id number] Age: 71 Admit Type: Outpatient Procedure:                Upper GI endoscopy Indications:              Epigastric abdominal pain, Abdominal pain in the                            right upper quadrant, Heme positive stool Providers:                Jeneen Rinks L. Oletta Lamas Dr., MD, Elmer Ramp. Tilden Dome, RN,                            William Dalton, Technician Referring MD:              Medicines:                Monitored Anesthesia Care Complications:            No immediate complications. Estimated Blood Loss:     Estimated blood loss was minimal. Procedure:                Pre-Anesthesia Assessment:                           - Prior to the procedure, a History and Physical                            was performed, and patient medications and                            allergies were reviewed. The patient's tolerance of                            previous anesthesia was also reviewed. The risks                            and benefits of the procedure and the sedation                            options and risks were discussed with the patient.                            All questions were answered, and informed consent                            was obtained. Prior Anticoagulants: The patient has                            taken Coumadin (warfarin), last dose was 3 days                            prior to procedure. ASA Grade Assessment: III - A  patient with severe systemic disease. After                            reviewing the risks and benefits, the patient was                            deemed in satisfactory condition to undergo the                            procedure.                           After obtaining informed consent, the endoscope was                            passed under  direct vision. Throughout the                            procedure, the patient's blood pressure, pulse, and                            oxygen saturations were monitored continuously. The                            [id number] ([id number]) scope was introduced through the                            mouth, and advanced to the second part of duodenum.                            The upper GI endoscopy was accomplished without                            difficulty. The patient tolerated the procedure                            well. Scope In: Scope Out: Findings:      Grade I varices were found in the distal esophagus.      Localized moderate inflammation characterized by erosions, erythema and       friability was found in the gastric antrum.      Localized mild inflammation characterized by congestion (edema) and       erosions was found in the duodenal bulb. Biopsies for histology were       taken with a cold forceps for evaluation of celiac disease. Impression:               - Grade I esophageal varices.                           - Chronic gastritis.                           - Duodenitis. Biopsied. Moderate Sedation:      MAC by anesthesia Recommendation:           - Patient has a contact number available for  emergencies. The signs and symptoms of potential                            delayed complications were discussed with the                            patient. Return to normal activities tomorrow.                            Written discharge instructions were provided to the                            patient.                           - Resume previous diet.                           - Continue present medications.                           - Resume Coumadin (warfarin) at prior dose tomorrow.                           - Return to endoscopist in 2 weeks.                           - Use Prilosec (omeprazole) 20 mg PO BID. Procedure Code(s):        ---  Professional ---                           [phone], Esophagogastroduodenoscopy, flexible,                            transoral; with biopsy, single or multiple Diagnosis Code(s):        --- Professional ---                           R10.13, Epigastric pain                           R10.11, Right upper quadrant pain                           R19.5, Other fecal abnormalities                           K29.80, Duodenitis without bleeding                           K29.50, Unspecified chronic gastritis without                            bleeding                           I85.00, Esophageal varices without bleeding CPT copyright 2016 American Medical Association. All  rights reserved. The codes documented in this report are preliminary and upon coder review may  be revised to meet current compliance requirements. Nancy Fetter Dr., MD 11/06/2016 11:27:07 AM This report has been signed electronically. Number of Addenda: 0

## 2016-11-06 NOTE — H&P (Signed)
Subjective:   Patient is a 71 y.o. 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 She is postcholecystectomy and post ERCP for CBD stones in the past. Her Coumadin is been on hold for 3 full days. She's having upper abdominal pain. In addition to all these issues she is on a gluten-free diet and somewhere along the way has been told that she has celiac disease.. Procedure including risks and benefits discussed in office.  Patient Active Problem List   Diagnosis Date Noted  . Antiphospholipid antibody with hypercoagulable state ([place]) 11/07/2012  . Transaminitis 09/07/2012  . Fever, unspecified 09/07/2012  . Abdominal pain 09/04/2012  . Subtherapeutic international normalized ratio (INR) 09/04/2012  . Diabetes mellitus ([place]) 09/04/2012  . Leukocytosis, unspecified 09/04/2012  . Varices, esophageal ([place]) 09/04/2012  . Transaminasemia 09/04/2012  . DVT (deep venous thrombosis) ([place]) 05/01/2011  . Varices 05/01/2011  . NASH (nonalcoholic steatohepatitis) 05/01/2011  . Incisional hernia without mention of obstruction or gangrene 02/14/2011   Past Medical History:  Diagnosis Date  . 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 Antiphospholipid antibody with hypercoagulable state ([place]) 11/07/2012  . Arthritis   . Asthma    well controlled, no rescue inhaler used  . Blood dyscrasia    anticardiolipid syndrome  . Blood transfusion    age 53 yr old-none since  . Celiac disease   . Claustrophobia   . Diabetes mellitus    Type II  . Diverticulitis   . DVT (deep venous thrombosis) ([place]) 05/01/2011   "to liver"  . Fibromyalgia   . GERD (gastroesophageal reflux disease)    11/01/16- not problem  . H/O seasonal allergies   . Heart murmur    "not a problem"  . Hypertension   . NASH (nonalcoholic steatohepatitis) 05/01/2011  .  Nausea & vomiting    11-17-14 "nausea and vomiting after meals" at present discussed with Dr. Oletta Lamas office per pt.  . Pneumonia    in 6 grade- "double"  . PONV (postoperative nausea and vomiting)    'I dont have it if they give me something in the IV."  . Portal hypertension ([place])   . 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 TIA (transient ischemic attack)   . Varices 05/01/2011  . Varices, esophageal ([place])   . Varices, gastric     Past Surgical History:  Procedure Laterality Date  . ABDOMINAL HYSTERECTOMY    . ANKLE FRACTURE SURGERY Left    retained hardware  . CHOLECYSTECTOMY    . COLON SURGERY     hx. diverticulitis  . COLONOSCOPY N/A 11/23/2014   Procedure: COLONOSCOPY;  Surgeon: [name], MD;  Location: [facility] ENDOSCOPY;  Service: Endoscopy;  Laterality: N/A;  . ESOPHAGOGASTRODUODENOSCOPY  04/28/2011   Procedure: ESOPHAGOGASTRODUODENOSCOPY (EGD);  Surgeon: [name]., MD;  Location: [facility] ENDOSCOPY;  Service: Endoscopy;  Laterality: N/A;  . ESOPHAGOGASTRODUODENOSCOPY N/A 11/23/2014   Procedure: ESOPHAGOGASTRODUODENOSCOPY (EGD);  Surgeon: [name], MD;  Location: [facility] ENDOSCOPY;  Service: Endoscopy;  Laterality: N/A;  . EUS N/A 09/25/2012   Procedure: ESOPHAGEAL ENDOSCOPIC ULTRASOUND (EUS) RADIAL;  Surgeon: Arta Silence, MD;  Location: [facility] ENDOSCOPY;  Service: Endoscopy;  Laterality: N/A;  . HERNIA REPAIR     luq incisional  . SPLENECTOMY, TOTAL    . TONSILLECTOMY      Prescriptions Prior to Admission  Medication Sig Dispense Refill Last Dose  . benazepril (LOTENSIN)  20 MG tablet Take 20 mg by mouth every morning.    11/06/2016 at 0800  . Cholecalciferol (VITAMIN D3) 2000 UNITS capsule Take 2,000 Units by mouth daily.   Past Week at Unknown time  . insulin glargine (LANTUS) 100 UNIT/ML injection Inject 10 Units into the skin at bedtime. If CBG is greater than 70, if CBG greater than 120- 11 units   11/05/2016 at 2000  . JUBLIA 10 % SOLN Apply 1 drop topically daily.  5 11/05/2016 at  2000  . OVER THE COUNTER MEDICATION Take 1 tablet by mouth every morning.    Past Week at Unknown time  . PREMARIN vaginal cream Place 1 Applicatorful vaginally 2 (two) times a week. Monday and Wednesday or Thursday.  3 Past Week at Unknown time  . propranolol (INDERAL) 80 MG tablet Take 80 mg by mouth every morning.    11/06/2016 at 0800  . vitamin B-12 (CYANOCOBALAMIN) 1000 MCG tablet Take 1,000 mcg by mouth daily.   11/05/2016 at Unknown time  . azithromycin (ZITHROMAX) 250 MG tablet    More than a month at Unknown time  . COUMADIN 7.5 MG tablet TAKE 1 TABLET (7.5 MG TOTAL) BY MOUTH DAILY. 90 tablet 3 11/02/2016 at 2000  . glucose 4 GM chewable tablet Chew 16 g by mouth as needed for low blood sugar.   Unknown at Unknown time  . LOTEMAX 0.5 % GEL    Unknown at Unknown time  . Pancrelipase, Lip-Prot-Amyl, 24000-76000 units CPEP Take 1 capsule (24,000 Units total) by mouth 2 (two) times daily at 8 am and 10 pm. 180 capsule 3 Unknown at Unknown time  . RESTASIS MULTIDOSE 0.05 % ophthalmic emulsion Place 1 drop into both eyes at bedtime.  0 10/03/2016 at Unknown time   Allergies  Allergen Reactions  . Caine-1 [Lidocaine Hcl] Anaphylaxis  . Morphine Anaphylaxis  . Restasis [Cyclosporine] Other (See Comments)    burns  . Zosyn [Piperacillin Sod-Tazobactam So] Itching    "scalp itch"-was given Benadryl to counteract.  . Codeine Nausea And Vomiting and Other (See Comments)  . Dilaudid [Hydromorphone Hcl] Nausea And Vomiting    Can take with zofran  . Morphine And Related Nausea And Vomiting  . Percocet [Oxycodone-Acetaminophen] Nausea And Vomiting  . Tramadol Nausea And Vomiting  . Vicodin [Hydrocodone-Acetaminophen] Nausea And Vomiting    Social History  Substance Use Topics  . Smoking status: Never Smoker  . Smokeless tobacco: Never Used     Comment: never used tobacco  . Alcohol use No    History reviewed. No pertinent family history.   Objective:   Patient Vitals for the past 8  hrs:  BP Temp Temp src Pulse Resp SpO2 Height Weight  11/06/16 1018 (!) 154/74 97.8 F (36.6 C) Oral 60 17 99 % 5' 4"  (1.626 m) 65.8 kg (145 lb)   No intake/output data recorded. No intake/output data recorded.   See MD Preop evaluation      Assessment:   1. Epigastric and right upper quadrant abdominal pain as noted above. Patient has multiple chronic problems and is chronically anticoagulated. Her stools were traced positive. In view of all this and EGD is performed to look for a cause of her pain.  Plan:   Will proceed with EGD at this time

## 2016-11-06 NOTE — Anesthesia Postprocedure Evaluation (Signed)
Anesthesia Post Note  Patient: Laurie Green  Procedure(s) Performed: Procedure(s) (LRB): ESOPHAGOGASTRODUODENOSCOPY (EGD) WITH PROPOFOL (N/A)     Patient location during evaluation: PACU Anesthesia Type: MAC Level of consciousness: awake and alert Pain management: pain level controlled Vital Signs Assessment: post-procedure vital signs reviewed and stable Respiratory status: spontaneous breathing, nonlabored ventilation and respiratory function stable Cardiovascular status: stable and blood pressure returned to baseline Anesthetic complications: no    Last Vitals:  Vitals:   11/06/16 1118 11/06/16 1130  BP: 114/60 (!) 110/43  Pulse: 61 61  Resp: 17 17  Temp:      Last Pain:  Vitals:   11/06/16 1018  TempSrc: Oral  PainSc: 5                  Julieann Drummonds,W. EDMOND

## 2016-11-06 NOTE — Anesthesia Preprocedure Evaluation (Addendum)
Anesthesia Evaluation  Patient identified by MRN, date of birth, ID band Patient awake    Reviewed: Allergy & Precautions, H&P , NPO status , Patient's Chart, lab work & pertinent test results, reviewed documented beta blocker date and time   History of Anesthesia Complications (+) PONV  Airway Mallampati: II  TM Distance: >3 FB Neck ROM: Full    Dental no notable dental hx. (+) Teeth Intact, Dental Advisory Given   Pulmonary asthma ,    Pulmonary exam normal breath sounds clear to auscultation       Cardiovascular hypertension, Pt. on medications and Pt. on home beta blockers  Rhythm:Regular Rate:Normal     Neuro/Psych Anxiety TIAnegative psych ROS   GI/Hepatic negative GI ROS, Neg liver ROS, (+)   Esophageal Varices    ,   Endo/Other  diabetes, Insulin Dependent  Renal/GU negative Renal ROS  negative genitourinary   Musculoskeletal  (+) Arthritis , Osteoarthritis,  Fibromyalgia -  Abdominal   Peds  Hematology  (+) Blood dyscrasia, ,   Anesthesia Other Findings   Reproductive/Obstetrics negative OB ROS                            Anesthesia Physical Anesthesia Plan  ASA: III  Anesthesia Plan: MAC   Post-op Pain Management:    Induction: Intravenous  PONV Risk Score and Plan: 3 and Propofol, Treatment may vary due to age or medical condition and Ondansetron  Airway Management Planned: Nasal Cannula  Additional Equipment:   Intra-op Plan:   Post-operative Plan:   Informed Consent: I have reviewed the patients History and Physical, chart, labs and discussed the procedure including the risks, benefits and alternatives for the proposed anesthesia with the patient or authorized representative who has indicated his/her understanding and acceptance.   Dental advisory given  Plan Discussed with: CRNA  Anesthesia Plan Comments:        Anesthesia Quick Evaluation

## 2016-11-06 NOTE — Anesthesia Procedure Notes (Signed)
Procedure Name: MAC Date/Time: 11/06/2016 11:02 AM Performed by: Lance Coon Pre-anesthesia Checklist: Patient identified, Emergency Drugs available, Suction available, Patient being monitored and Timeout performed Patient Re-evaluated:Patient Re-evaluated prior to induction Oxygen Delivery Method: Nasal cannula

## 2016-11-08 ENCOUNTER — Ambulatory Visit (HOSPITAL_BASED_OUTPATIENT_CLINIC_OR_DEPARTMENT_OTHER): Payer: PPO | Admitting: Hematology & Oncology

## 2016-11-08 ENCOUNTER — Other Ambulatory Visit (HOSPITAL_BASED_OUTPATIENT_CLINIC_OR_DEPARTMENT_OTHER): Payer: PPO

## 2016-11-08 VITALS — BP 138/71 | HR 58 | Temp 98.3°F | Resp 16 | Wt 149.0 lb

## 2016-11-08 DIAGNOSIS — D6861 Antiphospholipid syndrome: Secondary | ICD-10-CM

## 2016-11-08 DIAGNOSIS — M545 Low back pain: Secondary | ICD-10-CM | POA: Diagnosis not present

## 2016-11-08 DIAGNOSIS — E119 Type 2 diabetes mellitus without complications: Secondary | ICD-10-CM | POA: Diagnosis not present

## 2016-11-08 DIAGNOSIS — K7581 Nonalcoholic steatohepatitis (NASH): Secondary | ICD-10-CM | POA: Diagnosis not present

## 2016-11-08 DIAGNOSIS — I82403 Acute embolism and thrombosis of unspecified deep veins of lower extremity, bilateral: Secondary | ICD-10-CM

## 2016-11-08 DIAGNOSIS — K8681 Exocrine pancreatic insufficiency: Secondary | ICD-10-CM

## 2016-11-08 DIAGNOSIS — K760 Fatty (change of) liver, not elsewhere classified: Secondary | ICD-10-CM

## 2016-11-08 LAB — CBC WITH DIFFERENTIAL (CANCER CENTER ONLY)
BASO#: 0.1 10*3/uL (ref 0.0–0.2)
BASO%: 0.8 % (ref 0.0–2.0)
EOS%: 4.9 % (ref 0.0–7.0)
Eosinophils Absolute: 0.3 10*3/uL (ref 0.0–0.5)
HCT: 42.3 % (ref 34.8–46.6)
HGB: 14.9 g/dL (ref 11.6–15.9)
LYMPH#: 1.6 10*3/uL (ref 0.9–3.3)
LYMPH%: 24 % (ref 14.0–48.0)
MCH: 34.7 pg — ABNORMAL HIGH (ref 26.0–34.0)
MCHC: 35.2 g/dL (ref 32.0–36.0)
MCV: 99 fL (ref 81–101)
MONO#: 0.7 10*3/uL (ref 0.1–0.9)
MONO%: 9.9 % (ref 0.0–13.0)
NEUT#: 4 10*3/uL (ref 1.5–6.5)
NEUT%: 60.4 % (ref 39.6–80.0)
Platelets: 252 10*3/uL (ref 145–400)
RBC: 4.29 10*6/uL (ref 3.70–5.32)
RDW: 12.5 % (ref 11.1–15.7)
WBC: 6.5 10*3/uL (ref 3.9–10.0)

## 2016-11-08 LAB — CMP (CANCER CENTER ONLY)
ALT(SGPT): 29 U/L (ref 10–47)
AST: 32 U/L (ref 11–38)
Albumin: 3.6 g/dL (ref 3.3–5.5)
Alkaline Phosphatase: 80 U/L (ref 26–84)
BUN, Bld: 17 mg/dL (ref 7–22)
CO2: 32 mEq/L (ref 18–33)
Calcium: 9.2 mg/dL (ref 8.0–10.3)
Chloride: 102 mEq/L (ref 98–108)
Creat: 0.9 mg/dl (ref 0.6–1.2)
Glucose, Bld: 149 mg/dL — ABNORMAL HIGH (ref 73–118)
Potassium: 3.7 mEq/L (ref 3.3–4.7)
Sodium: 139 mEq/L (ref 128–145)
Total Bilirubin: 1.9 mg/dl — ABNORMAL HIGH (ref 0.20–1.60)
Total Protein: 6.7 g/dL (ref 6.4–8.1)

## 2016-11-08 LAB — LACTATE DEHYDROGENASE: LDH: 216 U/L (ref 125–245)

## 2016-11-08 LAB — PROTIME-INR (CHCC SATELLITE)
INR: 1.1 — ABNORMAL LOW (ref 2.0–3.5)
Protime: 13.2 Seconds (ref 10.6–13.4)
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
## 2016-11-09 ENCOUNTER — Encounter (HOSPITAL_COMMUNITY): Payer: Self-pay | Admitting: Gastroenterology

## 2016-11-10 ENCOUNTER — Ambulatory Visit (HOSPITAL_BASED_OUTPATIENT_CLINIC_OR_DEPARTMENT_OTHER): Payer: PPO

## 2016-11-10 ENCOUNTER — Other Ambulatory Visit: Payer: Self-pay | Admitting: *Deleted

## 2016-11-10 DIAGNOSIS — D6861 Antiphospholipid syndrome: Secondary | ICD-10-CM

## 2016-11-10 DIAGNOSIS — K7581 Nonalcoholic steatohepatitis (NASH): Secondary | ICD-10-CM

## 2016-11-10 DIAGNOSIS — I82403 Acute embolism and thrombosis of unspecified deep veins of lower extremity, bilateral: Secondary | ICD-10-CM

## 2016-11-10 LAB — PROTIME-INR (CHCC SATELLITE)
INR: 1.2 — ABNORMAL LOW (ref 2.0–3.5)
Protime: 14.4 Seconds — ABNORMAL HIGH (ref 10.6–13.4)

## 2016-11-13 ENCOUNTER — Telehealth: Payer: Self-pay | Admitting: *Deleted

## 2016-11-13 DIAGNOSIS — Z794 Long term (current) use of insulin: Secondary | ICD-10-CM | POA: Diagnosis not present

## 2016-11-13 DIAGNOSIS — Z7901 Long term (current) use of anticoagulants: Secondary | ICD-10-CM | POA: Diagnosis not present

## 2016-11-13 DIAGNOSIS — I10 Essential (primary) hypertension: Secondary | ICD-10-CM | POA: Diagnosis not present

## 2016-11-13 DIAGNOSIS — E782 Mixed hyperlipidemia: Secondary | ICD-10-CM | POA: Diagnosis not present

## 2016-11-13 DIAGNOSIS — E1165 Type 2 diabetes mellitus with hyperglycemia: Secondary | ICD-10-CM | POA: Diagnosis not present

## 2016-11-13 LAB — LOWER RESPIRATORY CULTURE

## 2016-11-13 NOTE — Telephone Encounter (Signed)
Received call from Dr. Inda Merlin with patient PT INR which was 1.5.  Dr. Marin Olp notified.  Pleased it had gone up.  Wants to recheck it this Friday.  Patient notified of this.  States it is way easier for her to have it checked at Dr. Inda Merlin office. This is ok with Dr. Marin Olp.

## 2016-11-15 ENCOUNTER — Telehealth: Payer: Self-pay

## 2016-11-15 NOTE — Telephone Encounter (Addendum)
-----   Message from [name], MD sent at 11/15/2016  2:39 PM EDT ----- Call - NO bacterial growth noted in the sputum!!!  pete   Above message provided to pt via phone. dph

## 2016-11-17 DIAGNOSIS — Z7901 Long term (current) use of anticoagulants: Secondary | ICD-10-CM | POA: Diagnosis not present

## 2016-11-21 ENCOUNTER — Ambulatory Visit
Admission: RE | Admit: 2016-11-21 | Discharge: 2016-11-21 | Disposition: A | Discharge status: Home / Self Care | Payer: PPO | Source: Ambulatory Visit | Attending: Gastroenterology | Admitting: Gastroenterology

## 2016-11-21 DIAGNOSIS — K869 Disease of pancreas, unspecified: Secondary | ICD-10-CM | POA: Diagnosis not present

## 2016-11-21 DIAGNOSIS — R1084 Generalized abdominal pain: Secondary | ICD-10-CM

## 2016-11-21 DIAGNOSIS — K7460 Unspecified cirrhosis of liver: Secondary | ICD-10-CM | POA: Diagnosis not present

## 2016-11-21 MED ORDER — GADOBENATE DIMEGLUMINE 529 MG/ML IV SOLN
13.0000 mL | Freq: Once | INTRAVENOUS | Status: AC | PRN
  Administered 2016-11-21: 13 mL via INTRAVENOUS

## 2016-11-27 DIAGNOSIS — H04123 Dry eye syndrome of bilateral lacrimal glands: Secondary | ICD-10-CM | POA: Diagnosis not present

## 2016-11-29 DIAGNOSIS — R109 Unspecified abdominal pain: Secondary | ICD-10-CM | POA: Diagnosis not present

## 2016-11-30 ENCOUNTER — Encounter: Payer: Self-pay | Admitting: Hematology & Oncology

## 2016-12-13 DIAGNOSIS — Z1231 Encounter for screening mammogram for malignant neoplasm of breast: Secondary | ICD-10-CM | POA: Diagnosis not present

## 2016-12-13 DIAGNOSIS — Z803 Family history of malignant neoplasm of breast: Secondary | ICD-10-CM | POA: Diagnosis not present

## 2016-12-20 ENCOUNTER — Other Ambulatory Visit: Payer: Self-pay | Admitting: *Deleted

## 2016-12-20 DIAGNOSIS — I82403 Acute embolism and thrombosis of unspecified deep veins of lower extremity, bilateral: Secondary | ICD-10-CM

## 2016-12-21 ENCOUNTER — Other Ambulatory Visit (HOSPITAL_BASED_OUTPATIENT_CLINIC_OR_DEPARTMENT_OTHER): Payer: PPO

## 2016-12-21 ENCOUNTER — Ambulatory Visit (HOSPITAL_BASED_OUTPATIENT_CLINIC_OR_DEPARTMENT_OTHER): Payer: PPO | Admitting: Hematology & Oncology

## 2016-12-21 VITALS — BP 132/57 | HR 73 | Temp 98.5°F | Wt 151.1 lb

## 2016-12-21 DIAGNOSIS — I82403 Acute embolism and thrombosis of unspecified deep veins of lower extremity, bilateral: Secondary | ICD-10-CM

## 2016-12-21 DIAGNOSIS — J3489 Other specified disorders of nose and nasal sinuses: Secondary | ICD-10-CM | POA: Diagnosis not present

## 2016-12-21 DIAGNOSIS — K7581 Nonalcoholic steatohepatitis (NASH): Secondary | ICD-10-CM

## 2016-12-21 DIAGNOSIS — R05 Cough: Secondary | ICD-10-CM | POA: Diagnosis not present

## 2016-12-21 DIAGNOSIS — D6861 Antiphospholipid syndrome: Secondary | ICD-10-CM

## 2016-12-21 DIAGNOSIS — E119 Type 2 diabetes mellitus without complications: Secondary | ICD-10-CM | POA: Diagnosis not present

## 2016-12-21 DIAGNOSIS — K760 Fatty (change of) liver, not elsewhere classified: Secondary | ICD-10-CM | POA: Diagnosis not present

## 2016-12-21 DIAGNOSIS — J0100 Acute maxillary sinusitis, unspecified: Secondary | ICD-10-CM

## 2016-12-21 DIAGNOSIS — I82402 Acute embolism and thrombosis of unspecified deep veins of left lower extremity: Secondary | ICD-10-CM

## 2016-12-21 LAB — CMP (CANCER CENTER ONLY)
ALT(SGPT): 29 U/L (ref 10–47)
AST: 35 U/L (ref 11–38)
Albumin: 3.6 g/dL (ref 3.3–5.5)
Alkaline Phosphatase: 82 U/L (ref 26–84)
BUN, Bld: 21 mg/dL (ref 7–22)
CO2: 31 mEq/L (ref 18–33)
Calcium: 9.5 mg/dL (ref 8.0–10.3)
Chloride: 105 mEq/L (ref 98–108)
Creat: 0.7 mg/dl (ref 0.6–1.2)
Glucose, Bld: 128 mg/dL — ABNORMAL HIGH (ref 73–118)
Potassium: 4 mEq/L (ref 3.3–4.7)
Sodium: 141 mEq/L (ref 128–145)
Total Bilirubin: 1.2 mg/dl (ref 0.20–1.60)
Total Protein: 7.1 g/dL (ref 6.4–8.1)

## 2016-12-21 LAB — CBC WITH DIFFERENTIAL (CANCER CENTER ONLY)
BASO#: 0 10*3/uL (ref 0.0–0.2)
BASO%: 0.5 % (ref 0.0–2.0)
EOS%: 1.7 % (ref 0.0–7.0)
Eosinophils Absolute: 0.1 10*3/uL (ref 0.0–0.5)
HCT: 42.5 % (ref 34.8–46.6)
HGB: 15 g/dL (ref 11.6–15.9)
LYMPH#: 1.5 10*3/uL (ref 0.9–3.3)
LYMPH%: 18.2 % (ref 14.0–48.0)
MCH: 34.4 pg — ABNORMAL HIGH (ref 26.0–34.0)
MCHC: 35.3 g/dL (ref 32.0–36.0)
MCV: 98 fL (ref 81–101)
MONO#: 0.9 10*3/uL (ref 0.1–0.9)
MONO%: 10.5 % (ref 0.0–13.0)
NEUT#: 5.6 10*3/uL (ref 1.5–6.5)
NEUT%: 69.1 % (ref 39.6–80.0)
Platelets: 267 10*3/uL (ref 145–400)
RBC: 4.36 10*6/uL (ref 3.70–5.32)
RDW: 13.2 % (ref 11.1–15.7)
WBC: 8.2 10*3/uL (ref 3.9–10.0)

## 2016-12-21 LAB — PROTIME-INR (CHCC SATELLITE)
INR: 2.8 (ref 2.0–3.5)
Protime: 33.6 Seconds — ABNORMAL HIGH (ref 10.6–13.4)

## 2016-12-21 LAB — LACTATE DEHYDROGENASE: LDH: 265 U/L — ABNORMAL HIGH (ref 125–245)

## 2016-12-21 MED ORDER — CEFDINIR 300 MG PO CAPS: 300.0000 mg | ORAL_CAPSULE | Freq: Two times a day (BID) | ORAL | 0 refills | Status: DC

## 2016-12-21 NOTE — Progress Notes (Signed)
Hematology and Oncology Follow Up Visit  Laurie Green [id number] 09/21/1945 71 y.o. 12/21/2016   Principle Diagnosis:  1. Antiphospholipid antibody syndrome. 2. History of recurrent lower extremity deep venous thrombosis. 3. Nonalcoholic steatohepatitis.  Current Therapy:   Coumadin to maintain an INR between 2-3.     Interim History:  Ms.  Laurie Green is back for followup. She is doing quite well. She is taking this holistic agent-C6.  They make this at home. It is a isotope of carbon. She really believes this is helping her.  Her blood sugars are doing okay. Her insulin makes her hungry.  She's had no bleeding. She's had no nausea or vomiting. She's had no fever. She's had a productive cough. This brings up thick clear mucus..  There's been no leg swelling.  She's had a headache. She does state occasional sinus pain. She feels that she does have a sinus infection. I will call in some [place] for her.  Overall, her performance status is ECOG 1.   Medications:  Current Outpatient Prescriptions:  .  benazepril (LOTENSIN) 20 MG tablet, Take 20 mg by mouth every morning. , Disp: , Rfl:  .  Cholecalciferol (VITAMIN D3) 2000 UNITS capsule, Take 2,000 Units by mouth daily., Disp: , Rfl:  .  COUMADIN 7.5 MG tablet, TAKE 1 TABLET (7.5 MG TOTAL) BY MOUTH DAILY., Disp: 90 tablet, Rfl: 3 .  glucose 4 GM chewable tablet, Chew 16 g by mouth as needed for low blood sugar., Disp: , Rfl:  .  insulin glargine (LANTUS) 100 UNIT/ML injection, Inject 10 Units into the skin at bedtime. If CBG is greater than 70, if CBG greater than 120- 11 units, Disp: , Rfl:  .  JUBLIA 10 % SOLN, Apply 1 drop topically daily., Disp: , Rfl: 5 .  LOTEMAX 0.5 % GEL, , Disp: , Rfl:  .  OVER THE COUNTER MEDICATION, Take 1 tablet by mouth every morning. , Disp: , Rfl:  .  Pancrelipase, Lip-Prot-Amyl, 24000-76000 units CPEP, Take 1 capsule (24,000 Units total) by mouth 2 (two) times daily at 8 am and 10 pm., Disp: 180  capsule, Rfl: 3 .  PREMARIN vaginal cream, Place 1 Applicatorful vaginally 2 (two) times a week. Monday and Wednesday or Thursday., Disp: , Rfl: 3 .  propranolol (INDERAL) 80 MG tablet, Take 80 mg by mouth every morning. , Disp: , Rfl:  .  vitamin B-12 (CYANOCOBALAMIN) 1000 MCG tablet, Take 1,000 mcg by mouth daily., Disp: , Rfl:   Allergies:  Allergies  Allergen Reactions  . Caine-1 [Lidocaine Hcl] Anaphylaxis  . Morphine Anaphylaxis  . Restasis [Cyclosporine] Other (See Comments)    burns  . Zosyn [Piperacillin Sod-Tazobactam So] Itching    "scalp itch"-was given Benadryl to counteract.  . Codeine Nausea And Vomiting and Other (See Comments)  . Dilaudid [Hydromorphone Hcl] Nausea And Vomiting    Can take with zofran  . Morphine And Related Nausea And Vomiting  . Percocet [Oxycodone-Acetaminophen] Nausea And Vomiting  . Tramadol Nausea And Vomiting  . Vicodin [Hydrocodone-Acetaminophen] Nausea And Vomiting    Past Medical History, Surgical history, Social history, and Family History were reviewed and updated.  Review of Systems: As above  Physical Exam:  weight is 151 lb 2 oz (68.5 kg). Her oral temperature is 98.5 F (36.9 C). Her blood pressure is 132/57 (abnormal) and her pulse is 73.   Physical Exam  Constitutional: She is oriented to person, place, and time.  HENT:  Head: Normocephalic and atraumatic.  Mouth/Throat: Oropharynx is clear and moist.  Eyes: Pupils are equal, round, and reactive to light. EOM are normal.  Neck: Normal range of motion.  Cardiovascular: Normal rate, regular rhythm and normal heart sounds.   Pulmonary/Chest: Effort normal and breath sounds normal.  Abdominal: Soft. Bowel sounds are normal.    Musculoskeletal: Normal range of motion. She exhibits no edema, tenderness or deformity.  Lymphadenopathy:    She has no cervical adenopathy.  Neurological: She is alert and oriented to person, place, and time.  Skin: Skin is warm and dry. No rash  noted. No erythema.  Psychiatric: She has a normal mood and affect. Her behavior is normal. Judgment and thought content normal.  Vitals reviewed.   Lab Results  Component Value Date   WBC 8.2 12/21/2016   HGB 15.0 12/21/2016   HCT 42.5 12/21/2016   MCV 98 12/21/2016   PLT 267 12/21/2016     Chemistry      Component Value Date/Time   NA 141 12/21/2016 1039   NA 140 09/07/2016 0946   K 4.0 12/21/2016 1039   K 4.1 09/07/2016 0946   CL 105 12/21/2016 1039   CO2 31 12/21/2016 1039   CO2 28 09/07/2016 0946   BUN 21 12/21/2016 1039   BUN 22.6 09/07/2016 0946   CREATININE 0.7 12/21/2016 1039   CREATININE 0.8 09/07/2016 0946      Component Value Date/Time   CALCIUM 9.5 12/21/2016 1039   CALCIUM 9.6 09/07/2016 0946   ALKPHOS 82 12/21/2016 1039   ALKPHOS 81 09/07/2016 0946   AST 35 12/21/2016 1039   AST 32 09/07/2016 0946   ALT 29 12/21/2016 1039   ALT 33 09/07/2016 0946   BILITOT 1.20 12/21/2016 1039   BILITOT 1.34 (H) 09/07/2016 0946      INR is 2.8  Impression and Plan: Laurie Green is 71 year old white female with the anti-phospholipid antibody syndrome. This really has not been a problem for her. The bigger issue has been the hepatic problems and her diabetes. She has steatosis. She has NASH.  His only she may have a sinus infection. As such, I will call in [place]. I think that she should do okay with Omnicef. I see that she has a allergy to penicillin. However, I am not sure if of this really translates over to cephalosporins.   I'm glad to see her back is doing better.  Again, blood clotting really is not going to be a problem. Her INR is perfect.  We will continue to monitor her. I will have the INR checked in 2 weeks just to make sure that there is no problems with the [place].  I will see her back in 6 weeks.   [name], MD 9/6/201811:27 AM

## 2017-01-04 ENCOUNTER — Other Ambulatory Visit: Payer: PPO

## 2017-01-05 ENCOUNTER — Other Ambulatory Visit (HOSPITAL_BASED_OUTPATIENT_CLINIC_OR_DEPARTMENT_OTHER): Payer: PPO

## 2017-01-05 DIAGNOSIS — I82402 Acute embolism and thrombosis of unspecified deep veins of left lower extremity: Secondary | ICD-10-CM

## 2017-01-05 DIAGNOSIS — D6861 Antiphospholipid syndrome: Secondary | ICD-10-CM | POA: Diagnosis not present

## 2017-01-05 DIAGNOSIS — J0100 Acute maxillary sinusitis, unspecified: Secondary | ICD-10-CM

## 2017-01-05 LAB — PROTIME-INR (CHCC SATELLITE)
INR: 2.2 (ref 2.0–3.5)
Protime: 26.4 Seconds — ABNORMAL HIGH (ref 10.6–13.4)

## 2017-01-29 ENCOUNTER — Ambulatory Visit: Payer: PPO | Admitting: Hematology & Oncology

## 2017-01-29 ENCOUNTER — Other Ambulatory Visit: Payer: PPO

## 2017-02-16 ENCOUNTER — Ambulatory Visit (HOSPITAL_BASED_OUTPATIENT_CLINIC_OR_DEPARTMENT_OTHER): Payer: PPO | Admitting: Hematology & Oncology

## 2017-02-16 ENCOUNTER — Other Ambulatory Visit (HOSPITAL_BASED_OUTPATIENT_CLINIC_OR_DEPARTMENT_OTHER): Payer: PPO

## 2017-02-16 VITALS — BP 155/59 | HR 60 | Temp 98.1°F | Resp 16 | Wt 151.0 lb

## 2017-02-16 DIAGNOSIS — K760 Fatty (change of) liver, not elsewhere classified: Secondary | ICD-10-CM | POA: Diagnosis not present

## 2017-02-16 DIAGNOSIS — J0100 Acute maxillary sinusitis, unspecified: Secondary | ICD-10-CM

## 2017-02-16 DIAGNOSIS — I82403 Acute embolism and thrombosis of unspecified deep veins of lower extremity, bilateral: Secondary | ICD-10-CM

## 2017-02-16 DIAGNOSIS — D6861 Antiphospholipid syndrome: Secondary | ICD-10-CM | POA: Diagnosis not present

## 2017-02-16 DIAGNOSIS — E119 Type 2 diabetes mellitus without complications: Secondary | ICD-10-CM | POA: Diagnosis not present

## 2017-02-16 DIAGNOSIS — H9202 Otalgia, left ear: Secondary | ICD-10-CM

## 2017-02-16 DIAGNOSIS — K7581 Nonalcoholic steatohepatitis (NASH): Secondary | ICD-10-CM

## 2017-02-16 DIAGNOSIS — H6021 Malignant otitis externa, right ear: Secondary | ICD-10-CM

## 2017-02-16 DIAGNOSIS — I82402 Acute embolism and thrombosis of unspecified deep veins of left lower extremity: Secondary | ICD-10-CM

## 2017-02-16 LAB — CBC WITH DIFFERENTIAL (CANCER CENTER ONLY)
BASO#: 0 10*3/uL (ref 0.0–0.2)
BASO%: 0.8 % (ref 0.0–2.0)
EOS%: 3.5 % (ref 0.0–7.0)
Eosinophils Absolute: 0.2 10*3/uL (ref 0.0–0.5)
HCT: 42.7 % (ref 34.8–46.6)
HGB: 15.1 g/dL (ref 11.6–15.9)
LYMPH#: 1.5 10*3/uL (ref 0.9–3.3)
LYMPH%: 30.7 % (ref 14.0–48.0)
MCH: 34.1 pg — ABNORMAL HIGH (ref 26.0–34.0)
MCHC: 35.4 g/dL (ref 32.0–36.0)
MCV: 96 fL (ref 81–101)
MONO#: 0.6 10*3/uL (ref 0.1–0.9)
MONO%: 12.5 % (ref 0.0–13.0)
NEUT#: 2.6 10*3/uL (ref 1.5–6.5)
NEUT%: 52.5 % (ref 39.6–80.0)
Platelets: 279 10*3/uL (ref 145–400)
RBC: 4.43 10*6/uL (ref 3.70–5.32)
RDW: 13.2 % (ref 11.1–15.7)
WBC: 4.9 10*3/uL (ref 3.9–10.0)

## 2017-02-16 LAB — CMP (CANCER CENTER ONLY)
ALT(SGPT): 29 U/L (ref 10–47)
AST: 33 U/L (ref 11–38)
Albumin: 3.7 g/dL (ref 3.3–5.5)
Alkaline Phosphatase: 74 U/L (ref 26–84)
BUN, Bld: 18 mg/dL (ref 7–22)
CO2: 30 mEq/L (ref 18–33)
Calcium: 9.5 mg/dL (ref 8.0–10.3)
Chloride: 105 mEq/L (ref 98–108)
Creat: 0.8 mg/dl (ref 0.6–1.2)
Glucose, Bld: 156 mg/dL — ABNORMAL HIGH (ref 73–118)
Potassium: 3.7 mEq/L (ref 3.3–4.7)
Sodium: 145 mEq/L (ref 128–145)
Total Bilirubin: 1.3 mg/dl (ref 0.20–1.60)
Total Protein: 7.1 g/dL (ref 6.4–8.1)

## 2017-02-16 LAB — LACTATE DEHYDROGENASE: LDH: 224 U/L (ref 125–245)

## 2017-02-16 LAB — PROTIME-INR (CHCC SATELLITE)
INR: 2.6 (ref 2.0–3.5)
Protime: 31.2 Seconds — ABNORMAL HIGH (ref 10.6–13.4)

## 2017-02-16 MED ORDER — NEOMYCIN-POLYMYXIN-HC 1 % OT SOLN: 3.0000 [drp] | Freq: Four times a day (QID) | OTIC | 0 refills | Status: DC

## 2017-02-16 NOTE — Progress Notes (Signed)
Hematology and Oncology Follow Up Visit  Laurie Green [id number] 10/01/1945 71 y.o. 02/16/2017   Principle Diagnosis:  1. Antiphospholipid antibody syndrome. 2. History of recurrent lower extremity deep venous thrombosis. 3. Nonalcoholic steatohepatitis.  Current Therapy:   Coumadin to maintain an INR between 2-3.     Interim History:  [name] is back for followup.  She still complaining of some problems with her left ear.  I looked in the ear today.  I do not see any fluid behind the tympanic membrane.  She may have had some erythema in the external auditory canal.  I will go ahead and give her some eardrops-Cortisporin.  She has had no problems with her Coumadin.  Her INR has been relatively therapeutic.  She has had no abdominal pain.  She is trying to manage her blood sugars.  This is been difficult for her.  She has had no fever.  She has had no cough.  She has had no bleeding.  She does take a holistic agent-C6 which she feels is helping her.  Hopefully, she will be able to enjoy Thanksgiving with her family.  Overall, her performance status is ECOG 1.  Medications:  Current Outpatient Prescriptions:  .  benazepril (LOTENSIN) 20 MG tablet, Take 20 mg by mouth every morning. , Disp: , Rfl:  .  cefdinir (OMNICEF) 300 MG capsule, Take 1 capsule (300 mg total) by mouth 2 (two) times daily., Disp: 14 capsule, Rfl: 0 .  Cholecalciferol (VITAMIN D3) 2000 UNITS capsule, Take 2,000 Units by mouth daily., Disp: , Rfl:  .  COUMADIN 7.5 MG tablet, TAKE 1 TABLET (7.5 MG TOTAL) BY MOUTH DAILY., Disp: 90 tablet, Rfl: 3 .  glucose 4 GM chewable tablet, Chew 16 g by mouth as needed for low blood sugar., Disp: , Rfl:  .  insulin glargine (LANTUS) 100 UNIT/ML injection, Inject 10 Units into the skin at bedtime. If CBG is greater than 70, if CBG greater than 120- 11 units, Disp: , Rfl:  .  JUBLIA 10 % SOLN, Apply 1 drop topically daily., Disp: , Rfl: 5 .  LOTEMAX 0.5 % GEL, , Disp: ,  Rfl:  .  NEOMYCIN-POLYMYXIN-HYDROCORTISONE (CORTISPORIN) 1 % SOLN OTIC solution, Place 3 drops into the left ear 4 (four) times daily., Disp: 10 mL, Rfl: 0 .  OVER THE COUNTER MEDICATION, Take 1 tablet by mouth every morning. , Disp: , Rfl:  .  Pancrelipase, Lip-Prot-Amyl, 24000-76000 units CPEP, Take 1 capsule (24,000 Units total) by mouth 2 (two) times daily at 8 am and 10 pm., Disp: 180 capsule, Rfl: 3 .  PREMARIN vaginal cream, Place 1 Applicatorful vaginally 2 (two) times a week. Monday and Wednesday or Thursday., Disp: , Rfl: 3 .  propranolol (INDERAL) 80 MG tablet, Take 80 mg by mouth every morning. , Disp: , Rfl:  .  vitamin B-12 (CYANOCOBALAMIN) 1000 MCG tablet, Take 1,000 mcg by mouth daily. Take 2 tablets of 1000 mcg, a total of 2000 mcg once a day, per patient., Disp: , Rfl:   Allergies:  Allergies  Allergen Reactions  . Caine-1 [Lidocaine Hcl] Anaphylaxis  . Morphine Anaphylaxis  . Restasis [Cyclosporine] Other (See Comments)    burns  . Zosyn [Piperacillin Sod-Tazobactam So] Itching    "scalp itch"-was given Benadryl to counteract.  . Codeine Nausea And Vomiting and Other (See Comments)  . Dilaudid [Hydromorphone Hcl] Nausea And Vomiting    Can take with zofran  . Morphine And Related Nausea And Vomiting  .  Percocet [Oxycodone-Acetaminophen] Nausea And Vomiting  . Tramadol Nausea And Vomiting  . Vicodin [Hydrocodone-Acetaminophen] Nausea And Vomiting    Past Medical History, Surgical history, Social history, and Family History were reviewed and updated.  Review of Systems: As above  Physical Exam:  weight is 151 lb (68.5 kg). Her oral temperature is 98.1 F (36.7 C). Her blood pressure is 155/59 (abnormal) and her pulse is 60. Her respiration is 16 and oxygen saturation is 96%.   Physical Exam  Constitutional: She is oriented to person, place, and time.  HENT:  Head: Normocephalic and atraumatic.  Mouth/Throat: Oropharynx is clear and moist.  Eyes: Pupils are  equal, round, and reactive to light. EOM are normal.  Neck: Normal range of motion.  Cardiovascular: Normal rate, regular rhythm and normal heart sounds.   Pulmonary/Chest: Effort normal and breath sounds normal.  Abdominal: Soft. Bowel sounds are normal.    Musculoskeletal: Normal range of motion. She exhibits no edema, tenderness or deformity.  Lymphadenopathy:    She has no cervical adenopathy.  Neurological: She is alert and oriented to person, place, and time.  Skin: Skin is warm and dry. No rash noted. No erythema.  Psychiatric: She has a normal mood and affect. Her behavior is normal. Judgment and thought content normal.  Vitals reviewed.   Lab Results  Component Value Date   WBC 4.9 02/16/2017   HGB 15.1 02/16/2017   HCT 42.7 02/16/2017   MCV 96 02/16/2017   PLT 279 02/16/2017     Chemistry      Component Value Date/Time   NA 145 02/16/2017 1047   NA 140 09/07/2016 0946   K 3.7 02/16/2017 1047   K 4.1 09/07/2016 0946   CL 105 02/16/2017 1047   CO2 30 02/16/2017 1047   CO2 28 09/07/2016 0946   BUN 18 02/16/2017 1047   BUN 22.6 09/07/2016 0946   CREATININE 0.8 02/16/2017 1047   CREATININE 0.8 09/07/2016 0946      Component Value Date/Time   CALCIUM 9.5 02/16/2017 1047   CALCIUM 9.6 09/07/2016 0946   ALKPHOS 74 02/16/2017 1047   ALKPHOS 81 09/07/2016 0946   AST 33 02/16/2017 1047   AST 32 09/07/2016 0946   ALT 29 02/16/2017 1047   ALT 33 09/07/2016 0946   BILITOT 1.30 02/16/2017 1047   BILITOT 1.34 (H) 09/07/2016 0946      INR is 2.6  Impression and Plan: Laurie Green is 71 year old white female with the anti-phospholipid antibody syndrome. This really has not been a problem for her. The bigger issue has been the hepatic problems and her diabetes. She has steatosis. She has NASH.  Overall, I really think she is doing fairly well.  She does have her chronic medical issues.  I think that in the long run, diabetes will be her biggest problem.  I will plan  to see her back in 6 weeks.  I does want to maintain a little closer follow-up with her.  I want to see how her ear is doing.  If her left ear is still causing problems, then she will have to go back to see her otolaryngologist.  I spent about 30 minutes with her.  I was spent quite a bit of time with her as she feels that we tend to give her the most time to talk about her medical problems and she enjoys the fact that we are able to listen to her and she feels very confident in our care.   Brihana Quickel  R, MD 11/2/201812:23 PM

## 2017-02-21 LAB — CARDIOLIPIN ANTIBODIES, IGG, IGM, IGA
Anticardiolipin Ab,IgA,Qn: <9 APL U/mL (ref 0–11)
Anticardiolipin Ab,IgG,Qn: <9 GPL U/mL (ref 0–14)
Anticardiolipin Ab,IgM,Qn: <9 MPL U/mL (ref 0–12)

## 2017-03-29 DIAGNOSIS — E782 Mixed hyperlipidemia: Secondary | ICD-10-CM | POA: Diagnosis not present

## 2017-03-29 DIAGNOSIS — E119 Type 2 diabetes mellitus without complications: Secondary | ICD-10-CM | POA: Diagnosis not present

## 2017-03-29 DIAGNOSIS — I10 Essential (primary) hypertension: Secondary | ICD-10-CM | POA: Diagnosis not present

## 2017-04-04 ENCOUNTER — Other Ambulatory Visit: Payer: Self-pay

## 2017-04-04 ENCOUNTER — Other Ambulatory Visit (HOSPITAL_BASED_OUTPATIENT_CLINIC_OR_DEPARTMENT_OTHER): Payer: PPO

## 2017-04-04 ENCOUNTER — Ambulatory Visit (HOSPITAL_BASED_OUTPATIENT_CLINIC_OR_DEPARTMENT_OTHER): Payer: PPO | Admitting: Hematology & Oncology

## 2017-04-04 VITALS — BP 156/72 | HR 57 | Temp 98.0°F | Resp 20 | Wt 151.5 lb

## 2017-04-04 DIAGNOSIS — K7581 Nonalcoholic steatohepatitis (NASH): Secondary | ICD-10-CM | POA: Diagnosis not present

## 2017-04-04 DIAGNOSIS — D6861 Antiphospholipid syndrome: Secondary | ICD-10-CM | POA: Diagnosis not present

## 2017-04-04 DIAGNOSIS — E119 Type 2 diabetes mellitus without complications: Secondary | ICD-10-CM | POA: Diagnosis not present

## 2017-04-04 DIAGNOSIS — H6021 Malignant otitis externa, right ear: Secondary | ICD-10-CM

## 2017-04-04 DIAGNOSIS — I82402 Acute embolism and thrombosis of unspecified deep veins of left lower extremity: Secondary | ICD-10-CM

## 2017-04-04 DIAGNOSIS — K760 Fatty (change of) liver, not elsewhere classified: Secondary | ICD-10-CM

## 2017-04-04 DIAGNOSIS — I82403 Acute embolism and thrombosis of unspecified deep veins of lower extremity, bilateral: Secondary | ICD-10-CM

## 2017-04-04 LAB — CBC WITH DIFFERENTIAL (CANCER CENTER ONLY)
BASO#: 0.1 10*3/uL (ref 0.0–0.2)
BASO%: 0.8 % (ref 0.0–2.0)
EOS%: 3.7 % (ref 0.0–7.0)
Eosinophils Absolute: 0.2 10*3/uL (ref 0.0–0.5)
HCT: 43.3 % (ref 34.8–46.6)
HGB: 14.9 g/dL (ref 11.6–15.9)
LYMPH#: 2.1 10*3/uL (ref 0.9–3.3)
LYMPH%: 35.2 % (ref 14.0–48.0)
MCH: 33.3 pg (ref 26.0–34.0)
MCHC: 34.4 g/dL (ref 32.0–36.0)
MCV: 97 fL (ref 81–101)
MONO#: 0.8 10*3/uL (ref 0.1–0.9)
MONO%: 12.6 % (ref 0.0–13.0)
NEUT#: 2.8 10*3/uL (ref 1.5–6.5)
NEUT%: 47.7 % (ref 39.6–80.0)
Platelets: 330 10*3/uL (ref 145–400)
RBC: 4.47 10*6/uL (ref 3.70–5.32)
RDW: 13.3 % (ref 11.1–15.7)
WBC: 5.9 10*3/uL (ref 3.9–10.0)

## 2017-04-04 LAB — CMP (CANCER CENTER ONLY)
ALT(SGPT): 36 U/L (ref 10–47)
AST: 36 U/L (ref 11–38)
Albumin: 3.7 g/dL (ref 3.3–5.5)
Alkaline Phosphatase: 93 U/L — ABNORMAL HIGH (ref 26–84)
BUN, Bld: 15 mg/dL (ref 7–22)
CO2: 33 mEq/L (ref 18–33)
Calcium: 9.5 mg/dL (ref 8.0–10.3)
Chloride: 103 mEq/L (ref 98–108)
Creat: 0.9 mg/dl (ref 0.6–1.2)
Glucose, Bld: 107 mg/dL (ref 73–118)
Potassium: 3.5 mEq/L (ref 3.3–4.7)
Sodium: 144 mEq/L (ref 128–145)
Total Bilirubin: 1.5 mg/dl (ref 0.20–1.60)
Total Protein: 7 g/dL (ref 6.4–8.1)

## 2017-04-04 LAB — PROTIME-INR (CHCC SATELLITE)
INR: 2.7 (ref 2.0–3.5)
Protime: 32.4 Seconds — ABNORMAL HIGH (ref 10.6–13.4)

## 2017-04-04 NOTE — Progress Notes (Signed)
Hematology and Oncology Follow Up Visit  Laurie Green [id number] Feb 10, 1946 71 y.o. 04/04/2017   Principle Diagnosis:  1. Antiphospholipid antibody syndrome. 2. History of recurrent lower extremity deep venous thrombosis. 3. Nonalcoholic steatohepatitis.  Current Therapy:   Coumadin to maintain an INR between 2-3.     Interim History:  Laurie Green is back for followup.  She is doing much better.  She feels better.  She had a nice Thanksgiving.  She will be going to the beach to [place] beach house for 10 days.  They leave tomorrow.  She says she passed some gallstones.  She knows when she passes gallstones.  Thankfully, she does not get pancreatitis.  She has had no bleeding.  She is on Coumadin.  Her INR today was 2.7.  She has had no fever.  She has had no cough or shortness of breath.  She has had no leg swelling.    There is been no change in bowel or bladder habits.    Overall, her performance status is ECOG 1.  Medications:  Current Outpatient Medications:  .  benazepril (LOTENSIN) 20 MG tablet, Take 20 mg by mouth every morning. , Disp: , Rfl:  .  Cholecalciferol (VITAMIN D3) 2000 UNITS capsule, Take 2,000 Units by mouth daily., Disp: , Rfl:  .  COUMADIN 7.5 MG tablet, TAKE 1 TABLET (7.5 MG TOTAL) BY MOUTH DAILY., Disp: 90 tablet, Rfl: 3 .  glucose 4 GM chewable tablet, Chew 16 g by mouth as needed for low blood sugar., Disp: , Rfl:  .  insulin glargine (LANTUS) 100 UNIT/ML injection, Inject 10 Units into the skin at bedtime. If CBG is greater than 70, if CBG greater than 120- 11 units, Disp: , Rfl:  .  LOTEMAX 0.5 % GEL, as needed. , Disp: , Rfl:  .  NEOMYCIN-POLYMYXIN-HYDROCORTISONE (CORTISPORIN) 1 % SOLN OTIC solution, Place 3 drops into the left ear 4 (four) times daily., Disp: 10 mL, Rfl: 0 .  NON FORMULARY, Carbon-60 two tablespoons daily, Disp: , Rfl:  .  NON FORMULARY, Serrapeptase 3 capsules daily, Disp: , Rfl:  .  NON FORMULARY, Curcumin two capsules in AM,  Disp: , Rfl:  .  OVER THE COUNTER MEDICATION, Take 1 tablet by mouth every morning. , Disp: , Rfl:  .  PREMARIN vaginal cream, Place 1 Applicatorful vaginally 2 (two) times a week. Monday and Wednesday or Thursday., Disp: , Rfl: 3 .  propranolol (INDERAL) 80 MG tablet, Take 80 mg by mouth every morning. , Disp: , Rfl:  .  vitamin B-12 (CYANOCOBALAMIN) 1000 MCG tablet, Take 1,000 mcg by mouth daily. Take 2 tablets of 1000 mcg, a total of 2000 mcg once a day, per patient., Disp: , Rfl:   Allergies:  Allergies  Allergen Reactions  . Caine-1 [Lidocaine Hcl] Anaphylaxis  . Morphine Anaphylaxis  . Restasis [Cyclosporine] Other (See Comments)    burns  . Zosyn [Piperacillin Sod-Tazobactam So] Itching    "scalp itch"-was given Benadryl to counteract.  . Codeine Nausea And Vomiting and Other (See Comments)  . Dilaudid [Hydromorphone Hcl] Nausea And Vomiting    Can take with zofran  . Morphine And Related Nausea And Vomiting  . Percocet [Oxycodone-Acetaminophen] Nausea And Vomiting  . Tramadol Nausea And Vomiting  . Vicodin [Hydrocodone-Acetaminophen] Nausea And Vomiting    Past Medical History, Surgical history, Social history, and Family History were reviewed and updated.  Review of Systems: As above  Physical Exam:  weight is 151 lb 8 oz (  68.7 kg). Her oral temperature is 98 F (36.7 C). Her blood pressure is 156/72 (abnormal) and her pulse is 57 (abnormal). Her respiration is 20 and oxygen saturation is 96%.   Physical Exam  Constitutional: She is oriented to person, place, and time.  HENT:  Head: Normocephalic and atraumatic.  Mouth/Throat: Oropharynx is clear and moist.  Eyes: EOM are normal. Pupils are equal, round, and reactive to light.  Neck: Normal range of motion.  Cardiovascular: Normal rate, regular rhythm and normal heart sounds.  Pulmonary/Chest: Effort normal and breath sounds normal.  Abdominal: Soft. Bowel sounds are normal.    Musculoskeletal: Normal range of  motion. She exhibits no edema, tenderness or deformity.  Lymphadenopathy:    She has no cervical adenopathy.  Neurological: She is alert and oriented to person, place, and time.  Skin: Skin is warm and dry. No rash noted. No erythema.  Psychiatric: She has a normal mood and affect. Her behavior is normal. Judgment and thought content normal.  Vitals reviewed.   Lab Results  Component Value Date   WBC 5.9 04/04/2017   HGB 14.9 04/04/2017   HCT 43.3 04/04/2017   MCV 97 04/04/2017   PLT 330 04/04/2017     Chemistry      Component Value Date/Time   NA 144 04/04/2017 1139   NA 140 09/07/2016 0946   K 3.5 04/04/2017 1139   K 4.1 09/07/2016 0946   CL 103 04/04/2017 1139   CO2 33 04/04/2017 1139   CO2 28 09/07/2016 0946   BUN 15 04/04/2017 1139   BUN 22.6 09/07/2016 0946   CREATININE 0.9 04/04/2017 1139   CREATININE 0.8 09/07/2016 0946      Component Value Date/Time   CALCIUM 9.5 04/04/2017 1139   CALCIUM 9.6 09/07/2016 0946   ALKPHOS 93 (H) 04/04/2017 1139   ALKPHOS 81 09/07/2016 0946   AST 36 04/04/2017 1139   AST 32 09/07/2016 0946   ALT 36 04/04/2017 1139   ALT 33 09/07/2016 0946   BILITOT 1.50 04/04/2017 1139   BILITOT 1.34 (H) 09/07/2016 0946      INR is 2.7  Impression and Plan: Laurie Green is 71 year old white female with the anti-phospholipid antibody syndrome. This really has not been a problem for her. The bigger issue has been the hepatic problems and her diabetes. She has steatosis. She has NASH.  Overall, I really think she is doing fairly well.  She does have her chronic medical issues.  I think that in the long run, diabetes will be her biggest problem.  I will plan to see her back in 6 weeks.  I am glad that she is feeling better.  I know that she does have health issues.  I know that her diabetes has been a problem.  I think she is to be quite aggressive with trying to manage this.  [name], MD 12/19/20181:25 PM

## 2017-04-14 DIAGNOSIS — Z888 Allergy status to other drugs, medicaments and biological substances status: Secondary | ICD-10-CM | POA: Diagnosis not present

## 2017-04-14 DIAGNOSIS — J029 Acute pharyngitis, unspecified: Secondary | ICD-10-CM | POA: Diagnosis not present

## 2017-04-14 DIAGNOSIS — Z884 Allergy status to anesthetic agent status: Secondary | ICD-10-CM | POA: Diagnosis not present

## 2017-04-14 DIAGNOSIS — J028 Acute pharyngitis due to other specified organisms: Secondary | ICD-10-CM | POA: Diagnosis not present

## 2017-04-14 DIAGNOSIS — Z79899 Other long term (current) drug therapy: Secondary | ICD-10-CM | POA: Diagnosis not present

## 2017-04-14 DIAGNOSIS — Z7901 Long term (current) use of anticoagulants: Secondary | ICD-10-CM | POA: Diagnosis not present

## 2017-04-14 DIAGNOSIS — Z880 Allergy status to penicillin: Secondary | ICD-10-CM | POA: Diagnosis not present

## 2017-04-14 DIAGNOSIS — B9789 Other viral agents as the cause of diseases classified elsewhere: Secondary | ICD-10-CM | POA: Diagnosis not present

## 2017-04-14 DIAGNOSIS — Z885 Allergy status to narcotic agent status: Secondary | ICD-10-CM | POA: Diagnosis not present

## 2017-05-15 DIAGNOSIS — J209 Acute bronchitis, unspecified: Secondary | ICD-10-CM | POA: Diagnosis not present

## 2017-05-15 DIAGNOSIS — Z7901 Long term (current) use of anticoagulants: Secondary | ICD-10-CM | POA: Diagnosis not present

## 2017-05-17 DIAGNOSIS — Z7901 Long term (current) use of anticoagulants: Secondary | ICD-10-CM | POA: Diagnosis not present

## 2017-05-21 DIAGNOSIS — Z7901 Long term (current) use of anticoagulants: Secondary | ICD-10-CM | POA: Diagnosis not present

## 2017-05-21 DIAGNOSIS — Z794 Long term (current) use of insulin: Secondary | ICD-10-CM | POA: Diagnosis not present

## 2017-05-21 DIAGNOSIS — E1165 Type 2 diabetes mellitus with hyperglycemia: Secondary | ICD-10-CM | POA: Diagnosis not present

## 2017-05-21 DIAGNOSIS — I8500 Esophageal varices without bleeding: Secondary | ICD-10-CM | POA: Diagnosis not present

## 2017-05-21 DIAGNOSIS — I81 Portal vein thrombosis: Secondary | ICD-10-CM | POA: Diagnosis not present

## 2017-05-21 DIAGNOSIS — K219 Gastro-esophageal reflux disease without esophagitis: Secondary | ICD-10-CM | POA: Diagnosis not present

## 2017-05-21 DIAGNOSIS — K862 Cyst of pancreas: Secondary | ICD-10-CM | POA: Diagnosis not present

## 2017-05-21 DIAGNOSIS — R1084 Generalized abdominal pain: Secondary | ICD-10-CM | POA: Diagnosis not present

## 2017-05-25 ENCOUNTER — Encounter: Payer: Self-pay | Admitting: Hematology & Oncology

## 2017-05-29 ENCOUNTER — Other Ambulatory Visit: Payer: PPO

## 2017-05-29 ENCOUNTER — Ambulatory Visit: Payer: PPO | Admitting: Hematology & Oncology

## 2017-05-29 DIAGNOSIS — R109 Unspecified abdominal pain: Secondary | ICD-10-CM | POA: Diagnosis not present

## 2017-05-30 ENCOUNTER — Other Ambulatory Visit: Payer: Self-pay | Admitting: Gastroenterology

## 2017-05-30 DIAGNOSIS — R7989 Other specified abnormal findings of blood chemistry: Secondary | ICD-10-CM

## 2017-05-30 DIAGNOSIS — R945 Abnormal results of liver function studies: Principal | ICD-10-CM

## 2017-06-07 ENCOUNTER — Encounter: Payer: Self-pay | Admitting: Hematology & Oncology

## 2017-06-07 ENCOUNTER — Ambulatory Visit
Admission: RE | Admit: 2017-06-07 | Discharge: 2017-06-07 | Disposition: A | Discharge status: Home / Self Care | Payer: PPO | Source: Ambulatory Visit | Attending: Gastroenterology | Admitting: Gastroenterology

## 2017-06-07 ENCOUNTER — Other Ambulatory Visit: Payer: Self-pay

## 2017-06-07 ENCOUNTER — Inpatient Hospital Stay (HOSPITAL_BASED_OUTPATIENT_CLINIC_OR_DEPARTMENT_OTHER): Payer: PPO | Admitting: Hematology & Oncology

## 2017-06-07 ENCOUNTER — Inpatient Hospital Stay: Payer: PPO | Attending: Hematology & Oncology

## 2017-06-07 VITALS — BP 157/74 | HR 58 | Temp 98.3°F | Resp 18 | Wt 148.0 lb

## 2017-06-07 DIAGNOSIS — M791 Myalgia, unspecified site: Secondary | ICD-10-CM | POA: Diagnosis not present

## 2017-06-07 DIAGNOSIS — R945 Abnormal results of liver function studies: Principal | ICD-10-CM

## 2017-06-07 DIAGNOSIS — Z86718 Personal history of other venous thrombosis and embolism: Secondary | ICD-10-CM | POA: Insufficient documentation

## 2017-06-07 DIAGNOSIS — R531 Weakness: Secondary | ICD-10-CM | POA: Insufficient documentation

## 2017-06-07 DIAGNOSIS — Z79899 Other long term (current) drug therapy: Secondary | ICD-10-CM | POA: Diagnosis not present

## 2017-06-07 DIAGNOSIS — Z794 Long term (current) use of insulin: Secondary | ICD-10-CM | POA: Diagnosis not present

## 2017-06-07 DIAGNOSIS — Z7901 Long term (current) use of anticoagulants: Secondary | ICD-10-CM | POA: Diagnosis not present

## 2017-06-07 DIAGNOSIS — K8020 Calculus of gallbladder without cholecystitis without obstruction: Secondary | ICD-10-CM

## 2017-06-07 DIAGNOSIS — R5383 Other fatigue: Secondary | ICD-10-CM | POA: Diagnosis not present

## 2017-06-07 DIAGNOSIS — D6861 Antiphospholipid syndrome: Secondary | ICD-10-CM

## 2017-06-07 DIAGNOSIS — R109 Unspecified abdominal pain: Secondary | ICD-10-CM | POA: Diagnosis not present

## 2017-06-07 DIAGNOSIS — K7460 Unspecified cirrhosis of liver: Secondary | ICD-10-CM | POA: Diagnosis not present

## 2017-06-07 DIAGNOSIS — R9389 Abnormal findings on diagnostic imaging of other specified body structures: Secondary | ICD-10-CM | POA: Diagnosis not present

## 2017-06-07 DIAGNOSIS — R197 Diarrhea, unspecified: Secondary | ICD-10-CM

## 2017-06-07 DIAGNOSIS — K7581 Nonalcoholic steatohepatitis (NASH): Secondary | ICD-10-CM

## 2017-06-07 DIAGNOSIS — I82402 Acute embolism and thrombosis of unspecified deep veins of left lower extremity: Secondary | ICD-10-CM

## 2017-06-07 DIAGNOSIS — R7989 Other specified abnormal findings of blood chemistry: Secondary | ICD-10-CM

## 2017-06-07 LAB — CBC WITH DIFFERENTIAL (CANCER CENTER ONLY)
Basophils Absolute: 0.1 10*3/uL (ref 0.0–0.1)
Basophils Relative: 1 %
Eosinophils Absolute: 0.4 10*3/uL (ref 0.0–0.5)
Eosinophils Relative: 4 %
HCT: 42.5 % (ref 34.8–46.6)
Hemoglobin: 14.8 g/dL (ref 11.6–15.9)
Lymphocytes Relative: 24 %
Lymphs Abs: 2.1 10*3/uL (ref 0.9–3.3)
MCH: 33.4 pg (ref 26.0–34.0)
MCHC: 34.8 g/dL (ref 32.0–36.0)
MCV: 95.9 fL (ref 81.0–101.0)
Monocytes Absolute: 1 10*3/uL — ABNORMAL HIGH (ref 0.1–0.9)
Monocytes Relative: 11 %
Neutro Abs: 5.4 10*3/uL (ref 1.5–6.5)
Neutrophils Relative %: 60 %
Platelet Count: 412 10*3/uL — ABNORMAL HIGH (ref 145–400)
RBC: 4.43 MIL/uL (ref 3.70–5.32)
RDW: 13.3 % (ref 11.1–15.7)
WBC Count: 8.9 10*3/uL (ref 3.9–10.0)

## 2017-06-07 LAB — CMP (CANCER CENTER ONLY)
ALT: 37 U/L (ref 10–47)
AST: 39 U/L — ABNORMAL HIGH (ref 11–38)
Albumin: 3.8 g/dL (ref 3.5–5.0)
Alkaline Phosphatase: 89 U/L — ABNORMAL HIGH (ref 26–84)
Anion gap: 9 (ref 5–15)
BUN: 12 mg/dL (ref 7–22)
CO2: 33 mmol/L (ref 18–33)
Calcium: 9.8 mg/dL (ref 8.0–10.3)
Chloride: 104 mmol/L (ref 98–108)
Creatinine: 0.8 mg/dL (ref 0.60–1.20)
Glucose, Bld: 123 mg/dL — ABNORMAL HIGH (ref 73–118)
Potassium: 4.1 mmol/L (ref 3.3–4.7)
Sodium: 146 mmol/L — ABNORMAL HIGH (ref 128–145)
Total Bilirubin: 1.5 mg/dL (ref 0.2–1.6)
Total Protein: 7.3 g/dL (ref 6.4–8.1)

## 2017-06-07 LAB — PROTIME-INR
INR: 2.06
Prothrombin Time: 23 seconds — ABNORMAL HIGH (ref 11.4–15.2)

## 2017-06-07 MED ORDER — GADOBENATE DIMEGLUMINE 529 MG/ML IV SOLN
13.0000 mL | Freq: Once | INTRAVENOUS | Status: AC | PRN
  Administered 2017-06-07: 13 mL via INTRAVENOUS

## 2017-06-07 NOTE — Progress Notes (Signed)
Hematology and Oncology Follow Up Visit  Laurie Green [id number] [birth year] 72 y.o. 06/07/2017   Principle Diagnosis:  1. Antiphospholipid antibody syndrome. 2. History of recurrent lower extremity deep venous thrombosis. 3. Nonalcoholic steatohepatitis.  Current Therapy:   Coumadin to maintain an INR between 2-3.     Interim History:  Ms.  Laurie Green is back for followup.  She is had a very tough time since we last saw her.  Unfortunately, it looks like she passed some gallstones.  I am surprised that she was not admitted for pancreatitis.  She did not, thankfully developed acute pancreatitis.  She goes for an MRI today.  She has had no bleeding.  Her INR today was 2.1.  She has had no fever.  She is had no cough or shortness of breath.  Her blood sugars have been doing fairly well.  Her performance status is ECOG 1.  Medications:  Current Outpatient Medications:  .  benazepril (LOTENSIN) 20 MG tablet, Take 20 mg by mouth every morning. , Disp: , Rfl:  .  Cholecalciferol (VITAMIN D3) 2000 UNITS capsule, Take 2,000 Units by mouth daily., Disp: , Rfl:  .  COUMADIN 7.5 MG tablet, TAKE 1 TABLET (7.5 MG TOTAL) BY MOUTH DAILY., Disp: 90 tablet, Rfl: 3 .  glucose 4 GM chewable tablet, Chew 16 g by mouth as needed for low blood sugar., Disp: , Rfl:  .  insulin glargine (LANTUS) 100 UNIT/ML injection, Inject 10 Units into the skin at bedtime. If CBG is greater than 70, if CBG greater than 120- 11 units, Disp: , Rfl:  .  LOTEMAX 0.5 % GEL, as needed. , Disp: , Rfl:  .  NEOMYCIN-POLYMYXIN-HYDROCORTISONE (CORTISPORIN) 1 % SOLN OTIC solution, Place 3 drops into the left ear 4 (four) times daily., Disp: 10 mL, Rfl: 0 .  NON FORMULARY, Carbon-60 two tablespoons daily, Disp: , Rfl:  .  NON FORMULARY, Serrapeptase 3 capsules daily, Disp: , Rfl:  .  NON FORMULARY, Curcumin two capsules in AM, Disp: , Rfl:  .  OVER THE COUNTER MEDICATION, Take 1 tablet by mouth every morning. , Disp: , Rfl:  .   PREMARIN vaginal cream, Place 1 Applicatorful vaginally 2 (two) times a week. Monday and Wednesday or Thursday., Disp: , Rfl: 3 .  propranolol (INDERAL) 80 MG tablet, Take 80 mg by mouth every morning. , Disp: , Rfl:  .  vitamin B-12 (CYANOCOBALAMIN) 1000 MCG tablet, Take 1,000 mcg by mouth daily. Take 2 tablets of 1000 mcg, a total of 2000 mcg once a day, per patient., Disp: , Rfl:   Allergies:  Allergies  Allergen Reactions  . Caine-1 [Lidocaine Hcl] Anaphylaxis  . Lidocaine Hcl Anaphylaxis  . Morphine Anaphylaxis and Other (See Comments)  . Piperacillin Sod-Tazobactam So Itching    "scalp itch"-was given Benadryl to counteract. "scalp itch"-was given Benadryl to counteract.  . Codeine Nausea And Vomiting and Other (See Comments)  . Cyclosporine Other (See Comments)    burns burns  . Hydrocodone-Acetaminophen Nausea And Vomiting  . Hydromorphone Hcl Nausea And Vomiting    Can take with zofran  . Oxycodone-Acetaminophen Nausea And Vomiting  . Tramadol Nausea And Vomiting  . Dilaudid [Hydromorphone Hcl] Nausea And Vomiting    Can take with zofran  . Morphine And Related Nausea And Vomiting  . Percocet [Oxycodone-Acetaminophen] Nausea And Vomiting  . Vicodin [Hydrocodone-Acetaminophen] Nausea And Vomiting    Past Medical History, Surgical history, Social history, and Family History were reviewed and updated.  Review of  Systems: Review of Systems  Constitutional: Positive for malaise/fatigue.  HENT: Negative.   Eyes: Negative.   Respiratory: Negative.   Cardiovascular: Negative.   Gastrointestinal: Positive for abdominal pain and diarrhea.  Genitourinary: Negative.   Musculoskeletal: Positive for myalgias.  Skin: Negative.   Neurological: Negative.   Endo/Heme/Allergies: Negative.   Psychiatric/Behavioral: Negative.      Physical Exam:  weight is 148 lb (67.1 kg). Her oral temperature is 98.3 F (36.8 C). Her blood pressure is 157/74 (abnormal) and her pulse is 58  (abnormal). Her respiration is 18 and oxygen saturation is 95%.   Physical Exam  Constitutional: She is oriented to person, place, and time.  HENT:  Head: Normocephalic and atraumatic.  Mouth/Throat: Oropharynx is clear and moist.  Eyes: EOM are normal. Pupils are equal, round, and reactive to light.  Neck: Normal range of motion.  Cardiovascular: Normal rate, regular rhythm and normal heart sounds.  Pulmonary/Chest: Effort normal and breath sounds normal.  Abdominal: Soft. Bowel sounds are normal.    Musculoskeletal: Normal range of motion. She exhibits no edema, tenderness or deformity.  Lymphadenopathy:    She has no cervical adenopathy.  Neurological: She is alert and oriented to person, place, and time.  Skin: Skin is warm and dry. No rash noted. No erythema.  Psychiatric: She has a normal mood and affect. Her behavior is normal. Judgment and thought content normal.  Vitals reviewed.   Lab Results  Component Value Date   WBC 8.9 06/07/2017   HGB 14.9 04/04/2017   HCT 42.5 06/07/2017   MCV 95.9 06/07/2017   PLT 412 (H) 06/07/2017     Chemistry      Component Value Date/Time   NA 146 (H) 06/07/2017 1100   NA 144 04/04/2017 1139   NA 140 09/07/2016 0946   K 4.1 06/07/2017 1100   K 3.5 04/04/2017 1139   K 4.1 09/07/2016 0946   CL 104 06/07/2017 1100   CL 103 04/04/2017 1139   CO2 33 06/07/2017 1100   CO2 33 04/04/2017 1139   CO2 28 09/07/2016 0946   BUN 12 06/07/2017 1100   BUN 15 04/04/2017 1139   BUN 22.6 09/07/2016 0946   CREATININE 0.80 06/07/2017 1100   CREATININE 0.9 04/04/2017 1139   CREATININE 0.8 09/07/2016 0946      Component Value Date/Time   CALCIUM 9.8 06/07/2017 1100   CALCIUM 9.5 04/04/2017 1139   CALCIUM 9.6 09/07/2016 0946   ALKPHOS 89 (H) 06/07/2017 1100   ALKPHOS 93 (H) 04/04/2017 1139   ALKPHOS 81 09/07/2016 0946   AST 39 (H) 06/07/2017 1100   AST 32 09/07/2016 0946   ALT 37 06/07/2017 1100   ALT 36 04/04/2017 1139   ALT 33  09/07/2016 0946   BILITOT 1.5 06/07/2017 1100   BILITOT 1.34 (H) 09/07/2016 0946      INR is 2.1  Impression and Plan: Laurie Green is 72 year old white female with the anti-phospholipid antibody syndrome. This really has not been a problem for her. The bigger issue has been the hepatic problems and her diabetes. She has steatosis. She has NASH.  For right now, we will see what the MRI has to show.  I am sure that it will show that she has NASH.  I told her that I did not think that this would put her at any higher risk for cancer.  She is always worried about developing liver cancer or pancreatic cancer.  I told her that with gallstones, there is a  high risk for bile duct cancer (cholangiocarcinoma).  I would like to see her back in 1 month.  I think this is necessary so that we can monitor her labs.    [name], MD 2/21/201912:18 PM

## 2017-06-14 DIAGNOSIS — K862 Cyst of pancreas: Secondary | ICD-10-CM | POA: Diagnosis not present

## 2017-06-14 DIAGNOSIS — R109 Unspecified abdominal pain: Secondary | ICD-10-CM | POA: Diagnosis not present

## 2017-07-04 ENCOUNTER — Inpatient Hospital Stay: Payer: PPO | Attending: Hematology & Oncology | Admitting: Hematology & Oncology

## 2017-07-04 ENCOUNTER — Other Ambulatory Visit: Payer: Self-pay

## 2017-07-04 ENCOUNTER — Inpatient Hospital Stay: Payer: PPO

## 2017-07-04 VITALS — BP 140/69 | HR 61 | Temp 98.1°F | Resp 20 | Wt 151.8 lb

## 2017-07-04 DIAGNOSIS — Z7901 Long term (current) use of anticoagulants: Secondary | ICD-10-CM | POA: Diagnosis not present

## 2017-07-04 DIAGNOSIS — R531 Weakness: Secondary | ICD-10-CM | POA: Insufficient documentation

## 2017-07-04 DIAGNOSIS — Z794 Long term (current) use of insulin: Secondary | ICD-10-CM

## 2017-07-04 DIAGNOSIS — Z86718 Personal history of other venous thrombosis and embolism: Secondary | ICD-10-CM | POA: Insufficient documentation

## 2017-07-04 DIAGNOSIS — K7581 Nonalcoholic steatohepatitis (NASH): Secondary | ICD-10-CM | POA: Insufficient documentation

## 2017-07-04 DIAGNOSIS — E119 Type 2 diabetes mellitus without complications: Secondary | ICD-10-CM | POA: Diagnosis not present

## 2017-07-04 DIAGNOSIS — D6861 Antiphospholipid syndrome: Secondary | ICD-10-CM | POA: Diagnosis not present

## 2017-07-04 DIAGNOSIS — K869 Disease of pancreas, unspecified: Secondary | ICD-10-CM | POA: Diagnosis not present

## 2017-07-04 DIAGNOSIS — K769 Liver disease, unspecified: Secondary | ICD-10-CM | POA: Insufficient documentation

## 2017-07-04 DIAGNOSIS — Z79899 Other long term (current) drug therapy: Secondary | ICD-10-CM | POA: Insufficient documentation

## 2017-07-04 DIAGNOSIS — R112 Nausea with vomiting, unspecified: Secondary | ICD-10-CM | POA: Diagnosis not present

## 2017-07-04 DIAGNOSIS — R109 Unspecified abdominal pain: Secondary | ICD-10-CM | POA: Insufficient documentation

## 2017-07-04 DIAGNOSIS — R5383 Other fatigue: Secondary | ICD-10-CM | POA: Diagnosis not present

## 2017-07-04 DIAGNOSIS — I82401 Acute embolism and thrombosis of unspecified deep veins of right lower extremity: Secondary | ICD-10-CM

## 2017-07-04 LAB — CBC WITH DIFFERENTIAL (CANCER CENTER ONLY)
Basophils Absolute: 0.1 10*3/uL (ref 0.0–0.1)
Basophils Relative: 1 %
Eosinophils Absolute: 0.4 10*3/uL (ref 0.0–0.5)
Eosinophils Relative: 5 %
HCT: 43.5 % (ref 34.8–46.6)
Hemoglobin: 15.1 g/dL (ref 11.6–15.9)
Lymphocytes Relative: 25 %
Lymphs Abs: 1.9 10*3/uL (ref 0.9–3.3)
MCH: 33.5 pg (ref 26.0–34.0)
MCHC: 34.7 g/dL (ref 32.0–36.0)
MCV: 96.5 fL (ref 81.0–101.0)
Monocytes Absolute: 1 10*3/uL — ABNORMAL HIGH (ref 0.1–0.9)
Monocytes Relative: 14 %
Neutro Abs: 4.1 10*3/uL (ref 1.5–6.5)
Neutrophils Relative %: 55 %
Platelet Count: 298 10*3/uL (ref 145–400)
RBC: 4.51 MIL/uL (ref 3.70–5.32)
RDW: 13.9 % (ref 11.1–15.7)
WBC Count: 7.4 10*3/uL (ref 3.9–10.0)

## 2017-07-04 LAB — CMP (CANCER CENTER ONLY)
ALT: 21 U/L (ref 0–55)
AST: 26 U/L (ref 5–34)
Albumin: 3.6 g/dL (ref 3.5–5.0)
Alkaline Phosphatase: 86 U/L (ref 40–150)
Anion gap: 10 (ref 3–11)
BUN: 23 mg/dL (ref 7–26)
CO2: 28 mmol/L (ref 22–29)
Calcium: 9.8 mg/dL (ref 8.4–10.4)
Chloride: 104 mmol/L (ref 98–109)
Creatinine: 0.86 mg/dL (ref 0.60–1.10)
GFR, Est AFR Am: >60 mL/min (ref >60)
GFR, Estimated: >60 mL/min (ref >60)
Glucose, Bld: 160 mg/dL — ABNORMAL HIGH (ref 70–140)
Potassium: 4.5 mmol/L (ref 3.5–5.1)
Sodium: 142 mmol/L (ref 136–145)
Total Bilirubin: 1.4 mg/dL — ABNORMAL HIGH (ref 0.2–1.2)
Total Protein: 7 g/dL (ref 6.4–8.3)

## 2017-07-04 LAB — PROTIME-INR
INR: 2.06
Prothrombin Time: 23 seconds — ABNORMAL HIGH (ref 11.4–15.2)

## 2017-07-04 NOTE — Progress Notes (Signed)
Hematology and Oncology Follow Up Visit  Laurie Green [id number] 1945/08/15 72 y.o. 07/04/2017   Principle Diagnosis:  1. Antiphospholipid antibody syndrome. 2. History of recurrent lower extremity deep venous thrombosis. 3. Nonalcoholic steatohepatitis.  Current Therapy:   Coumadin to maintain an INR between 2-3.     Interim History:  [name] is back for followup.  She still having some issues.  She apparently had an MRI done of the abdomen.  This was done on February 21.  This was ordered by her gastroenterologist.  This showed a slight growth in this pancreatic lesion that she has had for quite a while.  It now measures 1.9 x 1.5 x 1.7 cm.  It appears that the radiologist feel this might be a slow-growing pancreatic neoplasm such as a serous cystadenoma.  They recommended an MRI in 6 months.  Laurie Green cannot wait 6 months.  She would like to have one done in 3 months.  I think this is reasonable.  She still has some abdominal pain.  This mostly is on the right side.  The MRI that she had showed some portal venous hypertension.  She had large varices.  She does not have a gallbladder.  I suspect that the pain probably is adhesions from past surgeries.  He has had no fever.  She is had no bleeding.  She is on Coumadin.  There is no leg swelling.  As always, her blood sugars have been on the high side.  She has had no cough.  She is had no change in bowel or bladder habits.  Is been no diarrhea.  She has had occasional nausea with emesis.    Overall, her performance status is ECOG 1.  Medications:  Current Outpatient Medications:  .  benazepril (LOTENSIN) 20 MG tablet, Take 20 mg by mouth every morning. , Disp: , Rfl:  .  Cholecalciferol (VITAMIN D3) 2000 UNITS capsule, Take 2,000 Units by mouth daily., Disp: , Rfl:  .  COUMADIN 7.5 MG tablet, TAKE 1 TABLET (7.5 MG TOTAL) BY MOUTH DAILY., Disp: 90 tablet, Rfl: 3 .  glucose 4 GM chewable tablet, Chew 16 g by mouth as  needed for low blood sugar., Disp: , Rfl:  .  insulin glargine (LANTUS) 100 UNIT/ML injection, Inject 18 Units into the skin at bedtime. If CBG is greater than 70, if CBG greater than 120- 11 units , Disp: , Rfl:  .  LOTEMAX 0.5 % GEL, as needed. , Disp: , Rfl:  .  NEOMYCIN-POLYMYXIN-HYDROCORTISONE (CORTISPORIN) 1 % SOLN OTIC solution, Place 3 drops into the left ear 4 (four) times daily., Disp: 10 mL, Rfl: 0 .  NON FORMULARY, Carbon-60 two tablespoons daily, Disp: , Rfl:  .  NON FORMULARY, Serrapeptase 3 capsules daily, Disp: , Rfl:  .  NON FORMULARY, Curcumin two capsules in AM, Disp: , Rfl:  .  PREMARIN vaginal cream, Place 1 Applicatorful vaginally 2 (two) times a week. Monday and Wednesday or Thursday., Disp: , Rfl: 3 .  propranolol (INDERAL) 80 MG tablet, Take 80 mg by mouth every morning. , Disp: , Rfl:  .  ursodiol (ACTIGALL) 300 MG capsule, Take 300 mg by mouth 2 (two) times daily., Disp: , Rfl: 3 .  vitamin B-12 (CYANOCOBALAMIN) 1000 MCG tablet, Take 1,000 mcg by mouth daily. Take 2 tablets of 1000 mcg, a total of 2000 mcg once a day, per patient., Disp: , Rfl:  .  OVER THE COUNTER MEDICATION, Take 1 tablet by mouth every  morning. , Disp: , Rfl:   Allergies:  Allergies  Allergen Reactions  . Caine-1 [Lidocaine Hcl] Anaphylaxis  . Lidocaine Hcl Anaphylaxis  . Morphine Anaphylaxis and Other (See Comments)  . Piperacillin Sod-Tazobactam So Itching    "scalp itch"-was given Benadryl to counteract. "scalp itch"-was given Benadryl to counteract.  . Codeine Nausea And Vomiting and Other (See Comments)  . Cyclosporine Other (See Comments)    burns burns  . Hydrocodone-Acetaminophen Nausea And Vomiting  . Hydromorphone Hcl Nausea And Vomiting    Can take with zofran  . Oxycodone-Acetaminophen Nausea And Vomiting  . Tramadol Nausea And Vomiting  . Dilaudid [Hydromorphone Hcl] Nausea And Vomiting    Can take with zofran  . Morphine And Related Nausea And Vomiting  . Percocet  [Oxycodone-Acetaminophen] Nausea And Vomiting  . Vicodin [Hydrocodone-Acetaminophen] Nausea And Vomiting    Past Medical History, Surgical history, Social history, and Family History were reviewed and updated.  Review of Systems: Review of Systems  Constitutional: Positive for malaise/fatigue.  HENT: Negative.   Eyes: Negative.   Respiratory: Negative.   Cardiovascular: Negative.   Gastrointestinal: Positive for abdominal pain and diarrhea.  Genitourinary: Negative.   Musculoskeletal: Positive for myalgias.  Skin: Negative.   Neurological: Negative.   Endo/Heme/Allergies: Negative.   Psychiatric/Behavioral: Negative.      Physical Exam:  weight is 151 lb 12.8 oz (68.9 kg). Her oral temperature is 98.1 F (36.7 C). Her blood pressure is 140/69 and her pulse is 61. Her respiration is 20 and oxygen saturation is 98%.   Physical Exam  Constitutional: She is oriented to person, place, and time.  HENT:  Head: Normocephalic and atraumatic.  Mouth/Throat: Oropharynx is clear and moist.  Eyes: EOM are normal. Pupils are equal, round, and reactive to light.  Neck: Normal range of motion.  Cardiovascular: Normal rate, regular rhythm and normal heart sounds.  Pulmonary/Chest: Effort normal and breath sounds normal.  Abdominal: Soft. Bowel sounds are normal.    Musculoskeletal: Normal range of motion. She exhibits no edema, tenderness or deformity.  Lymphadenopathy:    She has no cervical adenopathy.  Neurological: She is alert and oriented to person, place, and time.  Skin: Skin is warm and dry. No rash noted. No erythema.  Psychiatric: She has a normal mood and affect. Her behavior is normal. Judgment and thought content normal.  Vitals reviewed.   Lab Results  Component Value Date   WBC 7.4 07/04/2017   HGB 14.9 04/04/2017   HCT 43.5 07/04/2017   MCV 96.5 07/04/2017   PLT 298 07/04/2017     Chemistry      Component Value Date/Time   NA 142 07/04/2017 1047   NA 144  04/04/2017 1139   NA 140 09/07/2016 0946   K 4.5 07/04/2017 1047   K 3.5 04/04/2017 1139   K 4.1 09/07/2016 0946   CL 104 07/04/2017 1047   CL 103 04/04/2017 1139   CO2 28 07/04/2017 1047   CO2 33 04/04/2017 1139   CO2 28 09/07/2016 0946   BUN 23 07/04/2017 1047   BUN 15 04/04/2017 1139   BUN 22.6 09/07/2016 0946   CREATININE 0.86 07/04/2017 1047   CREATININE 0.9 04/04/2017 1139   CREATININE 0.8 09/07/2016 0946      Component Value Date/Time   CALCIUM 9.8 07/04/2017 1047   CALCIUM 9.5 04/04/2017 1139   CALCIUM 9.6 09/07/2016 0946   ALKPHOS 86 07/04/2017 1047   ALKPHOS 93 (H) 04/04/2017 1139   ALKPHOS 81 09/07/2016 0946  AST 26 07/04/2017 1047   AST 32 09/07/2016 0946   ALT 21 07/04/2017 1047   ALT 36 04/04/2017 1139   ALT 33 09/07/2016 0946   BILITOT 1.4 (H) 07/04/2017 1047   BILITOT 1.34 (H) 09/07/2016 0946      INR is 2.06  Impression and Plan: Laurie Green is 72 year old white female with the anti-phospholipid antibody syndrome. This really has not been a problem for her. The bigger issue has been the hepatic problems and her diabetes. She has steatosis. She has NASH.  I try to reassure her that I do not think that she was going to have an issue with pancreatic cancer.  I told her that there were different types of pancreatic neoplasms.  I would like to see her back in about 4 weeks.  Hopefully, when she goes to the beach with her family, she will be able to relax a little bit more.     [name], MD 3/20/20195:59 PM

## 2017-07-22 ENCOUNTER — Other Ambulatory Visit: Payer: Self-pay | Admitting: Hematology & Oncology

## 2017-07-22 DIAGNOSIS — I82409 Acute embolism and thrombosis of unspecified deep veins of unspecified lower extremity: Secondary | ICD-10-CM

## 2017-08-03 DIAGNOSIS — M25511 Pain in right shoulder: Secondary | ICD-10-CM | POA: Diagnosis not present

## 2017-08-03 DIAGNOSIS — Z888 Allergy status to other drugs, medicaments and biological substances status: Secondary | ICD-10-CM | POA: Diagnosis not present

## 2017-08-03 DIAGNOSIS — Z86718 Personal history of other venous thrombosis and embolism: Secondary | ICD-10-CM | POA: Diagnosis not present

## 2017-08-03 DIAGNOSIS — R079 Chest pain, unspecified: Secondary | ICD-10-CM | POA: Diagnosis not present

## 2017-08-03 DIAGNOSIS — E785 Hyperlipidemia, unspecified: Secondary | ICD-10-CM | POA: Diagnosis not present

## 2017-08-03 DIAGNOSIS — E119 Type 2 diabetes mellitus without complications: Secondary | ICD-10-CM | POA: Diagnosis not present

## 2017-08-03 DIAGNOSIS — R0789 Other chest pain: Secondary | ICD-10-CM | POA: Diagnosis not present

## 2017-08-03 DIAGNOSIS — M797 Fibromyalgia: Secondary | ICD-10-CM | POA: Diagnosis not present

## 2017-08-03 DIAGNOSIS — Z885 Allergy status to narcotic agent status: Secondary | ICD-10-CM | POA: Diagnosis not present

## 2017-08-04 DIAGNOSIS — M797 Fibromyalgia: Secondary | ICD-10-CM | POA: Diagnosis not present

## 2017-08-04 DIAGNOSIS — E785 Hyperlipidemia, unspecified: Secondary | ICD-10-CM | POA: Diagnosis not present

## 2017-08-04 DIAGNOSIS — R0789 Other chest pain: Secondary | ICD-10-CM | POA: Diagnosis not present

## 2017-08-04 DIAGNOSIS — E119 Type 2 diabetes mellitus without complications: Secondary | ICD-10-CM | POA: Diagnosis not present

## 2017-08-06 ENCOUNTER — Ambulatory Visit: Payer: PPO | Admitting: Hematology & Oncology

## 2017-08-06 ENCOUNTER — Other Ambulatory Visit: Payer: PPO

## 2017-08-08 ENCOUNTER — Telehealth: Payer: Self-pay

## 2017-08-08 DIAGNOSIS — E782 Mixed hyperlipidemia: Secondary | ICD-10-CM | POA: Diagnosis not present

## 2017-08-08 DIAGNOSIS — Z7901 Long term (current) use of anticoagulants: Secondary | ICD-10-CM | POA: Diagnosis not present

## 2017-08-08 DIAGNOSIS — E1165 Type 2 diabetes mellitus with hyperglycemia: Secondary | ICD-10-CM | POA: Diagnosis not present

## 2017-08-08 DIAGNOSIS — I10 Essential (primary) hypertension: Secondary | ICD-10-CM | POA: Diagnosis not present

## 2017-08-08 DIAGNOSIS — Z794 Long term (current) use of insulin: Secondary | ICD-10-CM | POA: Diagnosis not present

## 2017-08-08 NOTE — Telephone Encounter (Signed)
Received call from pt stating [place] and her INR was found to be 1.5. Was prescribed Lovenox 22m daily. INR was rechecked Monday at PCP and found to still be 1.5. Patient questions if she needs to alter her Coumadin dose.   Per Dr EMarin Olp increase Coumadin to 183mx 5 days. D/C Lovenox. Pt verbalizes understanding and repeats back instructions. dph

## 2017-08-15 ENCOUNTER — Other Ambulatory Visit: Payer: Self-pay

## 2017-08-15 ENCOUNTER — Encounter: Payer: Self-pay | Admitting: Hematology & Oncology

## 2017-08-15 ENCOUNTER — Inpatient Hospital Stay (HOSPITAL_BASED_OUTPATIENT_CLINIC_OR_DEPARTMENT_OTHER): Payer: PPO | Admitting: Hematology & Oncology

## 2017-08-15 ENCOUNTER — Inpatient Hospital Stay: Payer: PPO | Attending: Hematology & Oncology

## 2017-08-15 ENCOUNTER — Telehealth: Payer: Self-pay | Admitting: *Deleted

## 2017-08-15 VITALS — BP 162/74 | HR 58 | Temp 98.1°F | Resp 20 | Wt 152.8 lb

## 2017-08-15 DIAGNOSIS — Z794 Long term (current) use of insulin: Secondary | ICD-10-CM | POA: Diagnosis not present

## 2017-08-15 DIAGNOSIS — H6021 Malignant otitis externa, right ear: Secondary | ICD-10-CM

## 2017-08-15 DIAGNOSIS — K862 Cyst of pancreas: Secondary | ICD-10-CM

## 2017-08-15 DIAGNOSIS — Z7901 Long term (current) use of anticoagulants: Secondary | ICD-10-CM

## 2017-08-15 DIAGNOSIS — D6861 Antiphospholipid syndrome: Secondary | ICD-10-CM

## 2017-08-15 DIAGNOSIS — Z79899 Other long term (current) drug therapy: Secondary | ICD-10-CM | POA: Diagnosis not present

## 2017-08-15 DIAGNOSIS — Z86718 Personal history of other venous thrombosis and embolism: Secondary | ICD-10-CM

## 2017-08-15 DIAGNOSIS — K7581 Nonalcoholic steatohepatitis (NASH): Secondary | ICD-10-CM | POA: Insufficient documentation

## 2017-08-15 DIAGNOSIS — E119 Type 2 diabetes mellitus without complications: Secondary | ICD-10-CM

## 2017-08-15 DIAGNOSIS — R1030 Lower abdominal pain, unspecified: Secondary | ICD-10-CM

## 2017-08-15 DIAGNOSIS — N3000 Acute cystitis without hematuria: Secondary | ICD-10-CM

## 2017-08-15 DIAGNOSIS — I82401 Acute embolism and thrombosis of unspecified deep veins of right lower extremity: Secondary | ICD-10-CM

## 2017-08-15 DIAGNOSIS — I82403 Acute embolism and thrombosis of unspecified deep veins of lower extremity, bilateral: Secondary | ICD-10-CM

## 2017-08-15 LAB — CBC WITH DIFFERENTIAL (CANCER CENTER ONLY)
Basophils Absolute: 0 10*3/uL (ref 0.0–0.1)
Basophils Relative: 1 %
Eosinophils Absolute: 0.2 10*3/uL (ref 0.0–0.5)
Eosinophils Relative: 3 %
HCT: 41.8 % (ref 34.8–46.6)
Hemoglobin: 14.5 g/dL (ref 11.6–15.9)
Lymphocytes Relative: 30 %
Lymphs Abs: 2.1 10*3/uL (ref 0.9–3.3)
MCH: 34 pg (ref 26.0–34.0)
MCHC: 34.7 g/dL (ref 32.0–36.0)
MCV: 97.9 fL (ref 81.0–101.0)
Monocytes Absolute: 0.9 10*3/uL (ref 0.1–0.9)
Monocytes Relative: 12 %
Neutro Abs: 4 10*3/uL (ref 1.5–6.5)
Neutrophils Relative %: 54 %
Platelet Count: 291 10*3/uL (ref 145–400)
RBC: 4.27 MIL/uL (ref 3.70–5.32)
RDW: 13.6 % (ref 11.1–15.7)
WBC Count: 7.2 10*3/uL (ref 3.9–10.0)

## 2017-08-15 LAB — CMP (CANCER CENTER ONLY)
ALT: 40 U/L (ref 10–47)
AST: 39 U/L — ABNORMAL HIGH (ref 11–38)
Albumin: 3.7 g/dL (ref 3.5–5.0)
Alkaline Phosphatase: 103 U/L — ABNORMAL HIGH (ref 26–84)
Anion gap: 6 (ref 5–15)
BUN: 18 mg/dL (ref 7–22)
CO2: 33 mmol/L (ref 18–33)
Calcium: 10 mg/dL (ref 8.0–10.3)
Chloride: 107 mmol/L (ref 98–108)
Creatinine: 0.9 mg/dL (ref 0.60–1.20)
Glucose, Bld: 111 mg/dL (ref 73–118)
Potassium: 4 mmol/L (ref 3.3–4.7)
Sodium: 146 mmol/L — ABNORMAL HIGH (ref 128–145)
Total Bilirubin: 1.3 mg/dL (ref 0.2–1.6)
Total Protein: 6.6 g/dL (ref 6.4–8.1)

## 2017-08-15 LAB — URINALYSIS, COMPLETE (UACMP) WITH MICROSCOPIC
Bilirubin Urine: NEGATIVE
Glucose, UA: NEGATIVE mg/dL
Hgb urine dipstick: NEGATIVE
Ketones, ur: NEGATIVE mg/dL
Leukocytes, UA: NEGATIVE
Nitrite: NEGATIVE
Protein, ur: NEGATIVE mg/dL
Specific Gravity, Urine: 1.01 (ref 1.005–1.030)
pH: 8.5 — ABNORMAL HIGH (ref 5.0–8.0)

## 2017-08-15 LAB — PROTIME-INR
INR: 1.91
Prothrombin Time: 21.7 seconds — ABNORMAL HIGH (ref 11.4–15.2)

## 2017-08-15 MED ORDER — NEOMYCIN-POLYMYXIN-HC 1 % OT SOLN: 3.0000 [drp] | Freq: Four times a day (QID) | OTIC | 2 refills | Status: DC

## 2017-08-15 MED ORDER — CIPROFLOXACIN HCL 500 MG PO TABS: 500.0000 mg | ORAL_TABLET | Freq: Two times a day (BID) | ORAL | 0 refills | Status: DC

## 2017-08-15 NOTE — Telephone Encounter (Addendum)
Patient aware of results. Patient aware of new prescription and pharmacy confirmed.   ----- Message from [name], MD sent at 08/15/2017  3:26 PM EDT ----- Call - she may have a UTI.  Call in Cipro 500 mg po BID x 5 days.  Thanks!!  [name]

## 2017-08-15 NOTE — Progress Notes (Signed)
Hematology and Oncology Follow Up Visit  Laurie Green [id number] 11/02/1945 72 y.o. 08/15/2017   Principle Diagnosis:  1. Antiphospholipid antibody syndrome. 2. History of recurrent lower extremity deep venous thrombosis. 3. Nonalcoholic steatohepatitis.  Current Therapy:   Coumadin to maintain an INR between 2-3.     Interim History:  Ms.  Green is back for followup. Has happened since we last saw her.  Her dog now has bladder cancer.  Actually, the dog is undergoing a clinical trial.  This I think is fantastic.  Hopefully, it is a superficial bladder cancer that will be treated and not cause him problems.  She then was down at the beach.  She thought she is going to have a heart attack.  She was in [place].  Thankfully, and her husband to get her to the new hospital facility down there.  She did not have a heart attack.  She was complaining of a lot of pain in the right shoulder.  She probably developed some inflammation after holding an umbrella.  She did have a low INR recently.  She was put on Lovenox for a few days.  Today, her INR is 1.91.  She is back on her maintenance Coumadin dose of 7.5 mg p.o. daily.  She has had no leg swelling.  She has had no fever.  She is had no cough or shortness of breath.  Overall, her performance status is ECOG 1.  Medications:  Current Outpatient Medications:  .  benazepril (LOTENSIN) 20 MG tablet, Take 20 mg by mouth every morning. , Disp: , Rfl:  .  Biotin 10000 MCG TABS, Take 10,000 mcg by mouth daily., Disp: , Rfl:  .  Cholecalciferol (VITAMIN D3) 2000 UNITS capsule, Take 2,000 Units by mouth daily., Disp: , Rfl:  .  Continuous Blood Gluc Receiver (FREESTYLE LIBRE 44 DAY READER) DEVI, See admin instructions., Disp: , Rfl: 0 .  Continuous Blood Gluc Sensor (FREESTYLE LIBRE 14 DAY SENSOR) MISC, See admin instructions., Disp: , Rfl: 5 .  COUMADIN 7.5 MG tablet, TAKE 1 TABLET (7.5 MG TOTAL) BY MOUTH DAILY., Disp: 90 tablet, Rfl: 3 .   glucose 4 GM chewable tablet, Chew 16 g by mouth as needed for low blood sugar., Disp: , Rfl:  .  insulin glargine (LANTUS) 100 UNIT/ML injection, Inject 18 Units into the skin at bedtime. If CBG greater than 120, take 19 units., Disp: , Rfl:  .  NEOMYCIN-POLYMYXIN-HYDROCORTISONE (CORTISPORIN) 1 % SOLN OTIC solution, Place 3 drops into the left ear 4 (four) times daily., Disp: 10 mL, Rfl: 2 .  NON FORMULARY, Carbon-60 two tablespoons daily, Disp: , Rfl:  .  NON FORMULARY, Serrapeptase 3 capsules daily, Disp: , Rfl:  .  NON FORMULARY, Curcumin two capsules in AM, Disp: , Rfl:  .  OVER THE COUNTER MEDICATION, Take 1 tablet by mouth every morning. , Disp: , Rfl:  .  PREMARIN vaginal cream, Place 1 Applicatorful vaginally 2 (two) times a week. Monday and Wednesday or Thursday., Disp: , Rfl: 3 .  propranolol (INDERAL) 80 MG tablet, Take 80 mg by mouth every morning. , Disp: , Rfl:  .  ursodiol (ACTIGALL) 300 MG capsule, Take 300 mg by mouth 2 (two) times daily., Disp: , Rfl: 3 .  vitamin B-12 (CYANOCOBALAMIN) 1000 MCG tablet, Take 1,000 mcg by mouth daily. Take 2 tablets of 1000 mcg, a total of 2000 mcg once a day, per patient., Disp: , Rfl:   Allergies:  Allergies  Allergen Reactions  .  Caine-1 [Lidocaine Hcl] Anaphylaxis  . Lidocaine Hcl Anaphylaxis  . Morphine Anaphylaxis and Other (See Comments)  . Piperacillin Sod-Tazobactam So Itching    "scalp itch"-was given Benadryl to counteract. "scalp itch"-was given Benadryl to counteract.  . Codeine Nausea And Vomiting and Other (See Comments)  . Cyclosporine Other (See Comments)    burns burns  . Hydrocodone-Acetaminophen Nausea And Vomiting  . Hydromorphone Hcl Nausea And Vomiting    Can take with zofran  . Oxycodone-Acetaminophen Nausea And Vomiting  . Tramadol Nausea And Vomiting  . Dilaudid [Hydromorphone Hcl] Nausea And Vomiting    Can take with zofran  . Morphine And Related Nausea And Vomiting  . Percocet [Oxycodone-Acetaminophen]  Nausea And Vomiting  . Vicodin [Hydrocodone-Acetaminophen] Nausea And Vomiting    Past Medical History, Surgical history, Social history, and Family History were reviewed and updated.  Review of Systems: Review of Systems  Constitutional: Positive for malaise/fatigue.  HENT: Negative.   Eyes: Negative.   Respiratory: Negative.   Cardiovascular: Negative.   Gastrointestinal: Positive for abdominal pain and diarrhea.  Genitourinary: Negative.   Musculoskeletal: Positive for myalgias.  Skin: Negative.   Neurological: Negative.   Endo/Heme/Allergies: Negative.   Psychiatric/Behavioral: Negative.      Physical Exam:  weight is 152 lb 12.8 oz (69.3 kg). Her oral temperature is 98.1 F (36.7 C). Her blood pressure is 162/74 (abnormal) and her pulse is 58 (abnormal). Her respiration is 20 and oxygen saturation is 97%.   Physical Exam  Constitutional: She is oriented to person, place, and time.  HENT:  Head: Normocephalic and atraumatic.  Mouth/Throat: Oropharynx is clear and moist.  Eyes: Pupils are equal, round, and reactive to light. EOM are normal.  Neck: Normal range of motion.  Cardiovascular: Normal rate, regular rhythm and normal heart sounds.  Pulmonary/Chest: Effort normal and breath sounds normal.  Abdominal: Soft. Bowel sounds are normal.    Musculoskeletal: Normal range of motion. She exhibits no edema, tenderness or deformity.  Lymphadenopathy:    She has no cervical adenopathy.  Neurological: She is alert and oriented to person, place, and time.  Skin: Skin is warm and dry. No rash noted. No erythema.  Psychiatric: She has a normal mood and affect. Her behavior is normal. Judgment and thought content normal.  Vitals reviewed.   Lab Results  Component Value Date   WBC 7.2 08/15/2017   HGB 14.5 08/15/2017   HCT 41.8 08/15/2017   MCV 97.9 08/15/2017   PLT 291 08/15/2017     Chemistry      Component Value Date/Time   NA 146 (H) 08/15/2017 1305   NA 144  04/04/2017 1139   NA 140 09/07/2016 0946   K 4.0 08/15/2017 1305   K 3.5 04/04/2017 1139   K 4.1 09/07/2016 0946   CL 107 08/15/2017 1305   CL 103 04/04/2017 1139   CO2 33 08/15/2017 1305   CO2 33 04/04/2017 1139   CO2 28 09/07/2016 0946   BUN 18 08/15/2017 1305   BUN 15 04/04/2017 1139   BUN 22.6 09/07/2016 0946   CREATININE 0.90 08/15/2017 1305   CREATININE 0.9 04/04/2017 1139   CREATININE 0.8 09/07/2016 0946      Component Value Date/Time   CALCIUM 10.0 08/15/2017 1305   CALCIUM 9.5 04/04/2017 1139   CALCIUM 9.6 09/07/2016 0946   ALKPHOS 103 (H) 08/15/2017 1305   ALKPHOS 93 (H) 04/04/2017 1139   ALKPHOS 81 09/07/2016 0946   AST 39 (H) 08/15/2017 1305   AST 32  09/07/2016 0946   ALT 40 08/15/2017 1305   ALT 36 04/04/2017 1139   ALT 33 09/07/2016 0946   BILITOT 1.3 08/15/2017 1305   BILITOT 1.34 (H) 09/07/2016 0946      INR is 1.91  Impression and Plan: Ms. Mcanelly is 72 year old white female with the anti-phospholipid antibody syndrome. This really has not been a problem for her. The bigger issue has been the hepatic problems and her diabetes. She has steatosis. She has NASH.  I would like to see her back in about 3 weeks.  A lot goes on with her.  We are typically the only ones she likes to see because she has complete trust in [place].  We are the only ones who spend time with her.  I spent about 30 minutes with her today.  Over half the time was spent face-to-face.  I was trying to figure out what exactly what is been going on with her "heart attack."  She is complaining of some dysuria.  We will check a urinalysis on her.  [name], MD 5/1/20192:21 PM

## 2017-08-16 LAB — CANCER ANTIGEN 19-9: CA 19-9: 23 U/mL (ref 0–35)

## 2017-09-13 ENCOUNTER — Encounter: Payer: Self-pay | Admitting: Hematology & Oncology

## 2017-09-13 ENCOUNTER — Inpatient Hospital Stay: Payer: PPO

## 2017-09-13 ENCOUNTER — Inpatient Hospital Stay (HOSPITAL_BASED_OUTPATIENT_CLINIC_OR_DEPARTMENT_OTHER): Payer: PPO | Admitting: Hematology & Oncology

## 2017-09-13 ENCOUNTER — Other Ambulatory Visit: Payer: Self-pay

## 2017-09-13 VITALS — BP 138/78 | HR 60 | Temp 98.3°F | Resp 16 | Wt 151.0 lb

## 2017-09-13 DIAGNOSIS — D6861 Antiphospholipid syndrome: Secondary | ICD-10-CM

## 2017-09-13 DIAGNOSIS — Z7901 Long term (current) use of anticoagulants: Secondary | ICD-10-CM | POA: Diagnosis not present

## 2017-09-13 DIAGNOSIS — K862 Cyst of pancreas: Secondary | ICD-10-CM | POA: Diagnosis not present

## 2017-09-13 DIAGNOSIS — Z794 Long term (current) use of insulin: Secondary | ICD-10-CM

## 2017-09-13 DIAGNOSIS — I82402 Acute embolism and thrombosis of unspecified deep veins of left lower extremity: Secondary | ICD-10-CM

## 2017-09-13 DIAGNOSIS — E119 Type 2 diabetes mellitus without complications: Secondary | ICD-10-CM | POA: Diagnosis not present

## 2017-09-13 DIAGNOSIS — Z79899 Other long term (current) drug therapy: Secondary | ICD-10-CM

## 2017-09-13 DIAGNOSIS — Z86718 Personal history of other venous thrombosis and embolism: Secondary | ICD-10-CM | POA: Diagnosis not present

## 2017-09-13 DIAGNOSIS — K7581 Nonalcoholic steatohepatitis (NASH): Secondary | ICD-10-CM | POA: Diagnosis not present

## 2017-09-13 DIAGNOSIS — R1030 Lower abdominal pain, unspecified: Secondary | ICD-10-CM

## 2017-09-13 LAB — CBC WITH DIFFERENTIAL (CANCER CENTER ONLY)
Basophils Absolute: 0.1 10*3/uL (ref 0.0–0.1)
Basophils Relative: 1 %
Eosinophils Absolute: 0.3 10*3/uL (ref 0.0–0.5)
Eosinophils Relative: 4 %
HCT: 43.5 % (ref 34.8–46.6)
Hemoglobin: 15.2 g/dL (ref 11.6–15.9)
Lymphocytes Relative: 32 %
Lymphs Abs: 2.2 10*3/uL (ref 0.9–3.3)
MCH: 33.9 pg (ref 26.0–34.0)
MCHC: 34.9 g/dL (ref 32.0–36.0)
MCV: 96.9 fL (ref 81.0–101.0)
Monocytes Absolute: 0.9 10*3/uL (ref 0.1–0.9)
Monocytes Relative: 13 %
Neutro Abs: 3.4 10*3/uL (ref 1.5–6.5)
Neutrophils Relative %: 50 %
Platelet Count: 296 10*3/uL (ref 145–400)
RBC: 4.49 MIL/uL (ref 3.70–5.32)
RDW: 13.3 % (ref 11.1–15.7)
WBC Count: 6.8 10*3/uL (ref 3.9–10.0)

## 2017-09-13 LAB — CMP (CANCER CENTER ONLY)
ALT: 36 U/L (ref 10–47)
AST: 31 U/L (ref 11–38)
Albumin: 3.7 g/dL (ref 3.5–5.0)
Alkaline Phosphatase: 89 U/L — ABNORMAL HIGH (ref 26–84)
Anion gap: 10 (ref 5–15)
BUN: 15 mg/dL (ref 7–22)
CO2: 32 mmol/L (ref 18–33)
Calcium: 9.2 mg/dL (ref 8.0–10.3)
Chloride: 103 mmol/L (ref 98–108)
Creatinine: 0.8 mg/dL (ref 0.60–1.20)
Glucose, Bld: 147 mg/dL — ABNORMAL HIGH (ref 73–118)
Potassium: 3.7 mmol/L (ref 3.3–4.7)
Sodium: 145 mmol/L (ref 128–145)
Total Bilirubin: 1.6 mg/dL (ref 0.2–1.6)
Total Protein: 6.9 g/dL (ref 6.4–8.1)

## 2017-09-13 LAB — PROTIME-INR
INR: 1.48
Prothrombin Time: 17.8 seconds — ABNORMAL HIGH (ref 11.4–15.2)

## 2017-09-13 NOTE — Progress Notes (Signed)
Hematology and Oncology Follow Up Visit  Laurie Green [id number] 1945-04-30 72 y.o. 09/13/2017   Principle Diagnosis:  1. Antiphospholipid antibody syndrome. 2. History of recurrent lower extremity deep venous thrombosis. 3. Nonalcoholic steatohepatitis.  Current Therapy:   Coumadin to maintain an INR between 2-3.     Interim History:  Ms.  Green is back for followup. She is doing okay.  She and her [place] were [place] for [facility] Day weekend.  They had a good time.  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  Last MRI was done back in February.  We probably should set up another MRI in June to reevaluate.  She has had no bleeding.  Her INR today is only 1.48.  She has had no fever.  She has had no leg swelling.  She wants to know about a "tummy tuck.".  I am not sure if this is a good idea with her given her past abdominal surgeries, her diabetes, and the fact that she is on blood thinner.  She is had no cough.  Her appetite is good.  She is trying to watch what she eats because of her diabetes.   Overall, her performance status is ECOG 1.  Medications:  Current Outpatient Medications:  .  benazepril (LOTENSIN) 20 MG tablet, Take 20 mg by mouth every morning. , Disp: , Rfl:  .  Biotin 10000 MCG TABS, Take 10,000 mcg by mouth daily., Disp: , Rfl:  .  Cholecalciferol (VITAMIN D3) 2000 UNITS capsule, Take 2,000 Units by mouth daily., Disp: , Rfl:  .  ciprofloxacin (CIPRO) 500 MG tablet, Take 1 tablet (500 mg total) by mouth 2 (two) times daily. For 5 days., Disp: 10 tablet, Rfl: 0 .  Continuous Blood Gluc Receiver (FREESTYLE LIBRE 32 DAY READER) DEVI, See admin instructions., Disp: , Rfl: 0 .  Continuous Blood Gluc Sensor (FREESTYLE LIBRE 14 DAY SENSOR) MISC, See admin instructions., Disp: , Rfl: 5 .  COUMADIN 7.5 MG tablet, TAKE 1 TABLET (7.5 MG TOTAL) BY MOUTH DAILY., Disp: 90 tablet, Rfl: 3 .  glucose 4 GM chewable tablet, Chew 16 g by mouth as  needed for low blood sugar., Disp: , Rfl:  .  insulin glargine (LANTUS) 100 UNIT/ML injection, Inject 18 Units into the skin at bedtime. If CBG greater than 120, take 19 units., Disp: , Rfl:  .  NEOMYCIN-POLYMYXIN-HYDROCORTISONE (CORTISPORIN) 1 % SOLN OTIC solution, Place 3 drops into the left ear 4 (four) times daily., Disp: 10 mL, Rfl: 2 .  NON FORMULARY, Carbon-60 two tablespoons daily, Disp: , Rfl:  .  NON FORMULARY, Serrapeptase 3 capsules daily, Disp: , Rfl:  .  NON FORMULARY, Curcumin two capsules in AM, Disp: , Rfl:  .  OVER THE COUNTER MEDICATION, Take 1 tablet by mouth every morning. , Disp: , Rfl:  .  PREMARIN vaginal cream, Place 1 Applicatorful vaginally 2 (two) times a week. Monday and Wednesday or Thursday., Disp: , Rfl: 3 .  propranolol (INDERAL) 80 MG tablet, Take 80 mg by mouth every morning. , Disp: , Rfl:  .  ursodiol (ACTIGALL) 300 MG capsule, Take 300 mg by mouth 2 (two) times daily., Disp: , Rfl: 3 .  vitamin B-12 (CYANOCOBALAMIN) 1000 MCG tablet, Take 1,000 mcg by mouth daily. Take 2 tablets of 1000 mcg, a total of 2000 mcg once a day, per patient., Disp: , Rfl:   Allergies:  Allergies  Allergen Reactions  . Caine-1 [Lidocaine Hcl] Anaphylaxis  .  Lidocaine Hcl Anaphylaxis  . Morphine Anaphylaxis and Other (See Comments)  . Piperacillin Sod-Tazobactam So Itching    "scalp itch"-was given Benadryl to counteract. "scalp itch"-was given Benadryl to counteract.  . Codeine Nausea And Vomiting and Other (See Comments)  . Cyclosporine Other (See Comments)    burns burns  . Hydrocodone-Acetaminophen Nausea And Vomiting  . Hydromorphone Hcl Nausea And Vomiting    Can take with zofran  . Oxycodone-Acetaminophen Nausea And Vomiting  . Tramadol Nausea And Vomiting  . Dilaudid [Hydromorphone Hcl] Nausea And Vomiting    Can take with zofran  . Morphine And Related Nausea And Vomiting  . Percocet [Oxycodone-Acetaminophen] Nausea And Vomiting  . Vicodin  [Hydrocodone-Acetaminophen] Nausea And Vomiting    Past Medical History, Surgical history, Social history, and Family History were reviewed and updated.  Review of Systems: Review of Systems  Constitutional: Positive for malaise/fatigue.  HENT: Negative.   Eyes: Negative.   Respiratory: Negative.   Cardiovascular: Negative.   Gastrointestinal: Positive for abdominal pain and diarrhea.  Genitourinary: Negative.   Musculoskeletal: Positive for myalgias.  Skin: Negative.   Neurological: Negative.   Endo/Heme/Allergies: Negative.   Psychiatric/Behavioral: Negative.      Physical Exam:  weight is 151 lb (68.5 kg). Her oral temperature is 98.3 F (36.8 C). Her blood pressure is 138/78 and her pulse is 60. Her respiration is 16 and oxygen saturation is 95%.   Physical Exam  Constitutional: She is oriented to person, place, and time.  HENT:  Head: Normocephalic and atraumatic.  Mouth/Throat: Oropharynx is clear and moist.  Eyes: Pupils are equal, round, and reactive to light. EOM are normal.  Neck: Normal range of motion.  Cardiovascular: Normal rate, regular rhythm and normal heart sounds.  Pulmonary/Chest: Effort normal and breath sounds normal.  Abdominal: Soft. Bowel sounds are normal.    Musculoskeletal: Normal range of motion. She exhibits no edema, tenderness or deformity.  Lymphadenopathy:    She has no cervical adenopathy.  Neurological: She is alert and oriented to person, place, and time.  Skin: Skin is warm and dry. No rash noted. No erythema.  Psychiatric: She has a normal mood and affect. Her behavior is normal. Judgment and thought content normal.  Vitals reviewed.   Lab Results  Component Value Date   WBC 6.8 09/13/2017   HGB 15.2 09/13/2017   HCT 43.5 09/13/2017   MCV 96.9 09/13/2017   PLT 296 09/13/2017     Chemistry      Component Value Date/Time   NA 145 09/13/2017 1147   NA 144 04/04/2017 1139   NA 140 09/07/2016 0946   K 3.7 09/13/2017 1147     K 3.5 04/04/2017 1139   K 4.1 09/07/2016 0946   CL 103 09/13/2017 1147   CL 103 04/04/2017 1139   CO2 32 09/13/2017 1147   CO2 33 04/04/2017 1139   CO2 28 09/07/2016 0946   BUN 15 09/13/2017 1147   BUN 15 04/04/2017 1139   BUN 22.6 09/07/2016 0946   CREATININE 0.80 09/13/2017 1147   CREATININE 0.9 04/04/2017 1139   CREATININE 0.8 09/07/2016 0946      Component Value Date/Time   CALCIUM 9.2 09/13/2017 1147   CALCIUM 9.5 04/04/2017 1139   CALCIUM 9.6 09/07/2016 0946   ALKPHOS 89 (H) 09/13/2017 1147   ALKPHOS 93 (H) 04/04/2017 1139   ALKPHOS 81 09/07/2016 0946   AST 31 09/13/2017 1147   AST 32 09/07/2016 0946   ALT 36 09/13/2017 1147   ALT  36 04/04/2017 1139   ALT 33 09/07/2016 0946   BILITOT 1.6 09/13/2017 1147   BILITOT 1.34 (H) 09/07/2016 0946      INR is 1.48  Impression and Plan: Laurie Green is 72 year old white female with the anti-phospholipid antibody syndrome. This really has not been a problem for her. The bigger issue has been the hepatic problems and her diabetes. She has steatosis. She has NASH.  Her INR is too low.  I told her to go ahead and increase her Coumadin to 10 mg a day for the next 4 days.  She will then go back to 7.5 mg a day.  We will then check her INR in 1 week.  We will check a MRI of her abdomen to reevaluate her pancreatic cyst in 2 weeks.  I will see her back in 3 weeks.    [name], MD 5/30/20191:37 PM

## 2017-09-17 ENCOUNTER — Other Ambulatory Visit: Payer: Self-pay | Admitting: Family

## 2017-09-17 DIAGNOSIS — K862 Cyst of pancreas: Secondary | ICD-10-CM

## 2017-09-20 ENCOUNTER — Other Ambulatory Visit: Payer: Self-pay | Admitting: *Deleted

## 2017-09-20 ENCOUNTER — Telehealth: Payer: Self-pay | Admitting: *Deleted

## 2017-09-20 ENCOUNTER — Inpatient Hospital Stay: Payer: PPO | Attending: Hematology & Oncology

## 2017-09-20 DIAGNOSIS — K7581 Nonalcoholic steatohepatitis (NASH): Secondary | ICD-10-CM | POA: Diagnosis not present

## 2017-09-20 DIAGNOSIS — Z794 Long term (current) use of insulin: Secondary | ICD-10-CM | POA: Diagnosis not present

## 2017-09-20 DIAGNOSIS — Z79899 Other long term (current) drug therapy: Secondary | ICD-10-CM | POA: Insufficient documentation

## 2017-09-20 DIAGNOSIS — Z7901 Long term (current) use of anticoagulants: Secondary | ICD-10-CM | POA: Diagnosis not present

## 2017-09-20 DIAGNOSIS — D6861 Antiphospholipid syndrome: Secondary | ICD-10-CM

## 2017-09-20 DIAGNOSIS — Z86718 Personal history of other venous thrombosis and embolism: Secondary | ICD-10-CM | POA: Diagnosis not present

## 2017-09-20 DIAGNOSIS — I81 Portal vein thrombosis: Secondary | ICD-10-CM | POA: Insufficient documentation

## 2017-09-20 DIAGNOSIS — G8929 Other chronic pain: Secondary | ICD-10-CM | POA: Insufficient documentation

## 2017-09-20 DIAGNOSIS — K869 Disease of pancreas, unspecified: Secondary | ICD-10-CM | POA: Diagnosis not present

## 2017-09-20 DIAGNOSIS — I82409 Acute embolism and thrombosis of unspecified deep veins of unspecified lower extremity: Secondary | ICD-10-CM

## 2017-09-20 DIAGNOSIS — E119 Type 2 diabetes mellitus without complications: Secondary | ICD-10-CM | POA: Insufficient documentation

## 2017-09-20 DIAGNOSIS — I82402 Acute embolism and thrombosis of unspecified deep veins of left lower extremity: Secondary | ICD-10-CM

## 2017-09-20 LAB — PROTIME-INR
INR: 1.87
Prothrombin Time: 21.3 seconds — ABNORMAL HIGH (ref 11.4–15.2)

## 2017-09-20 MED ORDER — COUMADIN 10 MG PO TABS: 10.0000 mg | ORAL_TABLET | Freq: Every day | ORAL | 2 refills | Status: DC

## 2017-09-20 NOTE — Telephone Encounter (Addendum)
Patient is aware of results and medication changes. Confirmed with teach back. Prescription sent in for 89m dose. She already has an appointment which she wishes to keep on 10/04/17.  ----- Message from PVolanda Napoleon MD sent at 09/20/2017  2:59 PM EDT ----- Call - the INR is still a little low.  Need to alternate Coumadin 7.5 mg daily with 10 mg daily.  Please set up PT/INR lab in 10 days.  PLaurey Arrow

## 2017-09-26 ENCOUNTER — Other Ambulatory Visit: Payer: PPO

## 2017-09-27 ENCOUNTER — Ambulatory Visit (HOSPITAL_COMMUNITY): Payer: PPO

## 2017-10-01 ENCOUNTER — Ambulatory Visit
Admission: RE | Admit: 2017-10-01 | Discharge: 2017-10-01 | Disposition: A | Discharge status: Home / Self Care | Payer: PPO | Source: Ambulatory Visit | Attending: Family | Admitting: Family

## 2017-10-01 DIAGNOSIS — K7460 Unspecified cirrhosis of liver: Secondary | ICD-10-CM | POA: Diagnosis not present

## 2017-10-01 DIAGNOSIS — K862 Cyst of pancreas: Secondary | ICD-10-CM | POA: Diagnosis not present

## 2017-10-01 MED ORDER — GADOBENATE DIMEGLUMINE 529 MG/ML IV SOLN
14.0000 mL | Freq: Once | INTRAVENOUS | Status: AC | PRN
Start: 2017-10-01 — End: 2017-10-01
  Administered 2017-10-01: 14 mL via INTRAVENOUS

## 2017-10-02 ENCOUNTER — Telehealth: Payer: Self-pay | Admitting: *Deleted

## 2017-10-02 NOTE — Telephone Encounter (Addendum)
Patient is aware of results  ----- Message from [name], MD sent at 10/02/2017 12:16 PM EDT ----- Call - the pancreatic cyst is a little bigger!!!  Nothing to worry about yet!! Laurie Green

## 2017-10-04 ENCOUNTER — Inpatient Hospital Stay: Payer: PPO

## 2017-10-04 ENCOUNTER — Inpatient Hospital Stay (HOSPITAL_BASED_OUTPATIENT_CLINIC_OR_DEPARTMENT_OTHER): Payer: PPO | Admitting: Hematology & Oncology

## 2017-10-04 ENCOUNTER — Other Ambulatory Visit: Payer: Self-pay

## 2017-10-04 VITALS — BP 140/67 | HR 56 | Temp 98.4°F | Resp 16 | Wt 151.0 lb

## 2017-10-04 DIAGNOSIS — Z86718 Personal history of other venous thrombosis and embolism: Secondary | ICD-10-CM | POA: Diagnosis not present

## 2017-10-04 DIAGNOSIS — D6861 Antiphospholipid syndrome: Secondary | ICD-10-CM | POA: Diagnosis not present

## 2017-10-04 DIAGNOSIS — E119 Type 2 diabetes mellitus without complications: Secondary | ICD-10-CM | POA: Diagnosis not present

## 2017-10-04 DIAGNOSIS — K869 Disease of pancreas, unspecified: Secondary | ICD-10-CM

## 2017-10-04 DIAGNOSIS — Z794 Long term (current) use of insulin: Secondary | ICD-10-CM

## 2017-10-04 DIAGNOSIS — Z79899 Other long term (current) drug therapy: Secondary | ICD-10-CM

## 2017-10-04 DIAGNOSIS — Z7901 Long term (current) use of anticoagulants: Secondary | ICD-10-CM

## 2017-10-04 DIAGNOSIS — K7581 Nonalcoholic steatohepatitis (NASH): Secondary | ICD-10-CM | POA: Diagnosis not present

## 2017-10-04 DIAGNOSIS — I81 Portal vein thrombosis: Secondary | ICD-10-CM | POA: Diagnosis not present

## 2017-10-04 DIAGNOSIS — I82402 Acute embolism and thrombosis of unspecified deep veins of left lower extremity: Secondary | ICD-10-CM

## 2017-10-04 DIAGNOSIS — G8929 Other chronic pain: Secondary | ICD-10-CM | POA: Diagnosis not present

## 2017-10-04 LAB — CBC WITH DIFFERENTIAL (CANCER CENTER ONLY)
Basophils Absolute: 0.1 10*3/uL (ref 0.0–0.1)
Basophils Relative: 1 %
Eosinophils Absolute: 0.2 10*3/uL (ref 0.0–0.5)
Eosinophils Relative: 4 %
HCT: 43.4 % (ref 34.8–46.6)
Hemoglobin: 15.2 g/dL (ref 11.6–15.9)
Lymphocytes Relative: 36 %
Lymphs Abs: 1.9 10*3/uL (ref 0.9–3.3)
MCH: 33.8 pg (ref 26.0–34.0)
MCHC: 35 g/dL (ref 32.0–36.0)
MCV: 96.4 fL (ref 81.0–101.0)
Monocytes Absolute: 0.7 10*3/uL (ref 0.1–0.9)
Monocytes Relative: 13 %
Neutro Abs: 2.4 10*3/uL (ref 1.5–6.5)
Neutrophils Relative %: 46 %
Platelet Count: 278 10*3/uL (ref 145–400)
RBC: 4.5 MIL/uL (ref 3.70–5.32)
RDW: 13 % (ref 11.1–15.7)
WBC Count: 5.3 10*3/uL (ref 3.9–10.0)

## 2017-10-04 LAB — CMP (CANCER CENTER ONLY)
ALT: 27 U/L (ref 10–47)
AST: 31 U/L (ref 11–38)
Albumin: 3.7 g/dL (ref 3.5–5.0)
Alkaline Phosphatase: 79 U/L (ref 26–84)
Anion gap: 10 (ref 5–15)
BUN: 17 mg/dL (ref 7–22)
CO2: 30 mmol/L (ref 18–33)
Calcium: 9.4 mg/dL (ref 8.0–10.3)
Chloride: 104 mmol/L (ref 98–108)
Creatinine: 0.7 mg/dL (ref 0.60–1.20)
Glucose, Bld: 163 mg/dL — ABNORMAL HIGH (ref 73–118)
Potassium: 4.1 mmol/L (ref 3.3–4.7)
Sodium: 144 mmol/L (ref 128–145)
Total Bilirubin: 1.5 mg/dL (ref 0.2–1.6)
Total Protein: 7 g/dL (ref 6.4–8.1)

## 2017-10-04 LAB — PROTIME-INR
INR: 1.9
Prothrombin Time: 21.6 seconds — ABNORMAL HIGH (ref 11.4–15.2)

## 2017-10-04 NOTE — Progress Notes (Signed)
Hematology and Oncology Follow Up Visit  Laurie Green [id number] [birth year] 72 y.o. 10/04/2017   Principle Diagnosis:  1. Antiphospholipid antibody syndrome. 2. History of recurrent lower extremity deep venous thrombosis. 3. Nonalcoholic steatohepatitis.  Current Therapy:   Coumadin to maintain an INR between 2-3.     Interim History:  Ms.  Laurie Green is back for followup. She is now worried about the recent MRI that she had done.  The MRI was done on June 17.  It showed minimal change in the cystic mass in the pancreatic head.  It now measures 1.7 x 1.4 x 1.8 cm.  She continues to have the cirrhosis.  She has some dilated intra-hepatic ducts.  There is no adenopathy in the abdomen.  Blood vessels all look fine.  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  She has chronic portal vein thrombosis with cavernous transformation.  I try to reassure her that I just did not think that this cystic pancreatic lesion was going to be a problem for her.  She has been having her Coumadin checked recently.  Her INR today was 1.9.  She is on 10 mg alternating with 7.5 mg daily.  She [place] ocean [place].  They all looking forward to this.  She has had no problems with headache.  She has had chronic abdominal pain.  She is had multiple abdominal surgeries.  There is no diarrhea.  She is had no leg swelling.  Overall, her performance status is ECOG 1.    Medications:  Current Outpatient Medications:  .  benazepril (LOTENSIN) 20 MG tablet, Take 20 mg by mouth every morning. , Disp: , Rfl:  .  Biotin 10000 MCG TABS, Take 10,000 mcg by mouth daily., Disp: , Rfl:  .  Cholecalciferol (VITAMIN D3) 2000 UNITS capsule, Take 2,000 Units by mouth daily., Disp: , Rfl:  .  ciprofloxacin (CIPRO) 500 MG tablet, Take 1 tablet (500 mg total) by mouth 2 (two) times daily. For 5 days., Disp: 10 tablet, Rfl: 0 .  Continuous Blood Gluc Receiver (FREESTYLE LIBRE 5 DAY READER)  DEVI, See admin instructions., Disp: , Rfl: 0 .  Continuous Blood Gluc Sensor (FREESTYLE LIBRE 14 DAY SENSOR) MISC, See admin instructions., Disp: , Rfl: 5 .  COUMADIN 10 MG tablet, Take 1 tablet (10 mg total) by mouth daily., Disp: 30 tablet, Rfl: 2 .  COUMADIN 7.5 MG tablet, TAKE 1 TABLET (7.5 MG TOTAL) BY MOUTH DAILY., Disp: 90 tablet, Rfl: 3 .  glucose 4 GM chewable tablet, Chew 16 g by mouth as needed for low blood sugar., Disp: , Rfl:  .  insulin glargine (LANTUS) 100 UNIT/ML injection, Inject 18 Units into the skin at bedtime. If CBG greater than 120, take 19 units., Disp: , Rfl:  .  NEOMYCIN-POLYMYXIN-HYDROCORTISONE (CORTISPORIN) 1 % SOLN OTIC solution, Place 3 drops into the left ear 4 (four) times daily., Disp: 10 mL, Rfl: 2 .  NON FORMULARY, Carbon-60 two tablespoons daily, Disp: , Rfl:  .  NON FORMULARY, Serrapeptase 3 capsules daily, Disp: , Rfl:  .  NON FORMULARY, Curcumin two capsules in AM, Disp: , Rfl:  .  OVER THE COUNTER MEDICATION, Take 1 tablet by mouth every morning. , Disp: , Rfl:  .  PREMARIN vaginal cream, Place 1 Applicatorful vaginally 2 (two) times a week. Monday and Wednesday or Thursday., Disp: , Rfl: 3 .  propranolol (INDERAL) 80 MG tablet, Take 80 mg by mouth every morning. ,  Disp: , Rfl:  .  ursodiol (ACTIGALL) 300 MG capsule, Take 300 mg by mouth 2 (two) times daily., Disp: , Rfl: 3 .  vitamin B-12 (CYANOCOBALAMIN) 1000 MCG tablet, Take 1,000 mcg by mouth daily. Take 2 tablets of 1000 mcg, a total of 2000 mcg once a day, per patient., Disp: , Rfl:   Allergies:  Allergies  Allergen Reactions  . Caine-1 [Lidocaine Hcl] Anaphylaxis  . Lidocaine Hcl Anaphylaxis  . Morphine Anaphylaxis and Other (See Comments)  . Piperacillin Sod-Tazobactam So Itching    "scalp itch"-was given Benadryl to counteract. "scalp itch"-was given Benadryl to counteract. "scalp itch"-was given Benadryl to counteract.  . Codeine Nausea And Vomiting and Other (See Comments)  .  Cyclosporine Other (See Comments)    burns burns burns  . Hydrocodone-Acetaminophen Nausea And Vomiting  . Hydromorphone Hcl Nausea And Vomiting    Can take with zofran  . Oxycodone-Acetaminophen Nausea And Vomiting  . Tramadol Nausea And Vomiting  . Dilaudid [Hydromorphone Hcl] Nausea And Vomiting    Can take with zofran  . Morphine And Related Nausea And Vomiting  . Percocet [Oxycodone-Acetaminophen] Nausea And Vomiting  . Vicodin [Hydrocodone-Acetaminophen] Nausea And Vomiting    Past Medical History, Surgical history, Social history, and Family History were reviewed and updated.  Review of Systems: Review of Systems  Constitutional: Positive for malaise/fatigue.  HENT: Negative.   Eyes: Negative.   Respiratory: Negative.   Cardiovascular: Negative.   Gastrointestinal: Positive for abdominal pain and diarrhea.  Genitourinary: Negative.   Musculoskeletal: Positive for myalgias.  Skin: Negative.   Neurological: Negative.   Endo/Heme/Allergies: Negative.   Psychiatric/Behavioral: Negative.      Physical Exam:  weight is 151 lb (68.5 kg). Her oral temperature is 98.4 F (36.9 C). Her blood pressure is 140/67 and her pulse is 56 (abnormal). Her respiration is 16 and oxygen saturation is 97%.   Physical Exam  Constitutional: She is oriented to person, place, and time.  HENT:  Head: Normocephalic and atraumatic.  Mouth/Throat: Oropharynx is clear and moist.  Eyes: Pupils are equal, round, and reactive to light. EOM are normal.  Neck: Normal range of motion.  Cardiovascular: Normal rate, regular rhythm and normal heart sounds.  Pulmonary/Chest: Effort normal and breath sounds normal.  Abdominal: Soft. Bowel sounds are normal.    Musculoskeletal: Normal range of motion. She exhibits no edema, tenderness or deformity.  Lymphadenopathy:    She has no cervical adenopathy.  Neurological: She is alert and oriented to person, place, and time.  Skin: Skin is warm and dry. No  rash noted. No erythema.  Psychiatric: She has a normal mood and affect. Her behavior is normal. Judgment and thought content normal.  Vitals reviewed.   Lab Results  Component Value Date   WBC 5.3 10/04/2017   HGB 15.2 10/04/2017   HCT 43.4 10/04/2017   MCV 96.4 10/04/2017   PLT 278 10/04/2017     Chemistry      Component Value Date/Time   NA 144 10/04/2017 1116   NA 144 04/04/2017 1139   NA 140 09/07/2016 0946   K 4.1 10/04/2017 1116   K 3.5 04/04/2017 1139   K 4.1 09/07/2016 0946   CL 104 10/04/2017 1116   CL 103 04/04/2017 1139   CO2 30 10/04/2017 1116   CO2 33 04/04/2017 1139   CO2 28 09/07/2016 0946   BUN 17 10/04/2017 1116   BUN 15 04/04/2017 1139   BUN 22.6 09/07/2016 0946   CREATININE 0.70 10/04/2017  1116   CREATININE 0.9 04/04/2017 1139   CREATININE 0.8 09/07/2016 0946      Component Value Date/Time   CALCIUM 9.4 10/04/2017 1116   CALCIUM 9.5 04/04/2017 1139   CALCIUM 9.6 09/07/2016 0946   ALKPHOS 79 10/04/2017 1116   ALKPHOS 93 (H) 04/04/2017 1139   ALKPHOS 81 09/07/2016 0946   AST 31 10/04/2017 1116   AST 32 09/07/2016 0946   ALT 27 10/04/2017 1116   ALT 36 04/04/2017 1139   ALT 33 09/07/2016 0946   BILITOT 1.5 10/04/2017 1116   BILITOT 1.34 (H) 09/07/2016 0946      INR is 1.90  Impression and Plan: Ms. Schlup is 72 year old white female with the anti-phospholipid antibody syndrome. This really has not been a problem for her. The bigger issue has been the hepatic problems and her diabetes. She has steatosis. She has NASH.  I told her that we would repeat an MRI in 4 months.  I think this would be reasonable.  I told her that any surgery for this would be incredibly high risk.  We will have her repeat the INR in 2 weeks.  I will see her back in 6 weeks.  [name], MD 6/20/201912:53 PM

## 2017-11-15 ENCOUNTER — Encounter: Payer: Self-pay | Admitting: Hematology & Oncology

## 2017-11-15 ENCOUNTER — Inpatient Hospital Stay: Payer: PPO | Attending: Hematology & Oncology

## 2017-11-15 ENCOUNTER — Other Ambulatory Visit: Payer: Self-pay

## 2017-11-15 ENCOUNTER — Inpatient Hospital Stay (HOSPITAL_BASED_OUTPATIENT_CLINIC_OR_DEPARTMENT_OTHER): Payer: PPO | Admitting: Hematology & Oncology

## 2017-11-15 VITALS — BP 137/75 | HR 77 | Temp 98.2°F | Resp 16 | Wt 153.0 lb

## 2017-11-15 DIAGNOSIS — Z7901 Long term (current) use of anticoagulants: Secondary | ICD-10-CM | POA: Insufficient documentation

## 2017-11-15 DIAGNOSIS — R1900 Intra-abdominal and pelvic swelling, mass and lump, unspecified site: Secondary | ICD-10-CM

## 2017-11-15 DIAGNOSIS — Z86718 Personal history of other venous thrombosis and embolism: Secondary | ICD-10-CM | POA: Diagnosis not present

## 2017-11-15 DIAGNOSIS — R531 Weakness: Secondary | ICD-10-CM | POA: Diagnosis not present

## 2017-11-15 DIAGNOSIS — Z794 Long term (current) use of insulin: Secondary | ICD-10-CM

## 2017-11-15 DIAGNOSIS — Z79899 Other long term (current) drug therapy: Secondary | ICD-10-CM | POA: Diagnosis not present

## 2017-11-15 DIAGNOSIS — D6861 Antiphospholipid syndrome: Secondary | ICD-10-CM

## 2017-11-15 DIAGNOSIS — K7581 Nonalcoholic steatohepatitis (NASH): Secondary | ICD-10-CM | POA: Diagnosis not present

## 2017-11-15 DIAGNOSIS — I82401 Acute embolism and thrombosis of unspecified deep veins of right lower extremity: Secondary | ICD-10-CM

## 2017-11-15 DIAGNOSIS — R5383 Other fatigue: Secondary | ICD-10-CM | POA: Diagnosis not present

## 2017-11-15 DIAGNOSIS — E119 Type 2 diabetes mellitus without complications: Secondary | ICD-10-CM

## 2017-11-15 DIAGNOSIS — R5382 Chronic fatigue, unspecified: Secondary | ICD-10-CM

## 2017-11-15 DIAGNOSIS — E0800 Diabetes mellitus due to underlying condition with hyperosmolarity without nonketotic hyperglycemic-hyperosmolar coma (NKHHC): Secondary | ICD-10-CM

## 2017-11-15 LAB — COMPREHENSIVE METABOLIC PANEL
ALT: 23 U/L (ref 0–44)
AST: 26 U/L (ref 15–41)
Albumin: 3.8 g/dL (ref 3.5–5.0)
Alkaline Phosphatase: 77 U/L (ref 38–126)
Anion gap: 8 (ref 5–15)
BUN: 16 mg/dL (ref 8–23)
CO2: 29 mmol/L (ref 22–32)
Calcium: 9.5 mg/dL (ref 8.9–10.3)
Chloride: 103 mmol/L (ref 98–111)
Creatinine, Ser: 0.66 mg/dL (ref 0.44–1.00)
GFR calc Af Amer: >60 mL/min (ref >60)
GFR calc non Af Amer: >60 mL/min (ref >60)
Glucose, Bld: 176 mg/dL — ABNORMAL HIGH (ref 70–99)
Potassium: 3.8 mmol/L (ref 3.5–5.1)
Sodium: 140 mmol/L (ref 135–145)
Total Bilirubin: 1.3 mg/dL — ABNORMAL HIGH (ref 0.3–1.2)
Total Protein: 6.8 g/dL (ref 6.5–8.1)

## 2017-11-15 LAB — CBC WITH DIFFERENTIAL (CANCER CENTER ONLY)
Basophils Absolute: 0 10*3/uL (ref 0.0–0.1)
Basophils Relative: 1 %
Eosinophils Absolute: 0.2 10*3/uL (ref 0.0–0.5)
Eosinophils Relative: 4 %
HCT: 43.4 % (ref 34.8–46.6)
Hemoglobin: 15 g/dL (ref 11.6–15.9)
Lymphocytes Relative: 37 %
Lymphs Abs: 1.6 10*3/uL (ref 0.9–3.3)
MCH: 33.7 pg (ref 26.0–34.0)
MCHC: 34.6 g/dL (ref 32.0–36.0)
MCV: 97.5 fL (ref 81.0–101.0)
Monocytes Absolute: 0.6 10*3/uL (ref 0.1–0.9)
Monocytes Relative: 14 %
Neutro Abs: 1.9 10*3/uL (ref 1.5–6.5)
Neutrophils Relative %: 44 %
Platelet Count: 178 10*3/uL (ref 145–400)
RBC: 4.45 MIL/uL (ref 3.70–5.32)
RDW: 12.7 % (ref 11.1–15.7)
WBC Count: 4.4 10*3/uL (ref 3.9–10.0)

## 2017-11-15 LAB — PROTIME-INR
INR: 1.81
Prothrombin Time: 20.8 seconds — ABNORMAL HIGH (ref 11.4–15.2)

## 2017-11-15 NOTE — Progress Notes (Signed)
Hematology and Oncology Follow Up Visit  Laurie Green [id number] Dec 14, 1945 72 y.o. 11/15/2017   Principle Diagnosis:  1. Antiphospholipid antibody syndrome. 2. History of recurrent lower extremity deep venous thrombosis. 3. Nonalcoholic steatohepatitis.  Current Therapy:   Coumadin to maintain an INR between 2-3.     Interim History:  Ms.  Laurie Green is back for followup. Apparently, she needs to have some oral surgery.  She has some teeth that are not doing well.  She is going to have extractions the end of August.  Her graph she is on Coumadin.  Her INR is 1.8 today.  I think this is adequate for her.  I would not adjust her Coumadin dose as of yet.  I want to see her back for few days before she has surgery for her teeth.  I told her to to keep on her Coumadin until she sees me back and then we will recheck her INR.  If, for some reason, her INR is quite high, I will give her some vitamin K.  Otherwise, she feels very tired.  She does not have a lot of energy.  She is diabetic now.  She is on insulin.  We are checking her TSH.  She has not had any problems with bowels or bladder.  She is really not had abdominal discomfort.  She was having pain on the right side of her abdomen.  The last time he saw her, she had MRI done which showed slight cystic changes in the pancreatic head with a cystic mass.  We have to follow-up with an MRI in probably about 3 or 4 months.  She is had no bleeding.    Overall, her performance status is ECOG 1.    Medications:  Current Outpatient Medications:  .  benazepril (LOTENSIN) 20 MG tablet, Take 20 mg by mouth every morning. , Disp: , Rfl:  .  Biotin 10000 MCG TABS, Take 10,000 mcg by mouth daily., Disp: , Rfl:  .  Cholecalciferol (VITAMIN D3) 2000 UNITS capsule, Take 2,000 Units by mouth daily., Disp: , Rfl:  .  ciprofloxacin (CIPRO) 500 MG tablet, Take 1 tablet (500 mg total) by mouth 2 (two) times daily. For 5 days., Disp: 10 tablet, Rfl: 0 .   Continuous Blood Gluc Receiver (FREESTYLE LIBRE 90 DAY READER) DEVI, See admin instructions., Disp: , Rfl: 0 .  Continuous Blood Gluc Sensor (FREESTYLE LIBRE 14 DAY SENSOR) MISC, See admin instructions., Disp: , Rfl: 5 .  COUMADIN 10 MG tablet, Take 1 tablet (10 mg total) by mouth daily., Disp: 30 tablet, Rfl: 2 .  COUMADIN 7.5 MG tablet, TAKE 1 TABLET (7.5 MG TOTAL) BY MOUTH DAILY., Disp: 90 tablet, Rfl: 3 .  glucose 4 GM chewable tablet, Chew 16 g by mouth as needed for low blood sugar., Disp: , Rfl:  .  insulin glargine (LANTUS) 100 UNIT/ML injection, Inject 18 Units into the skin at bedtime. If CBG greater than 120, take 19 units., Disp: , Rfl:  .  NEOMYCIN-POLYMYXIN-HYDROCORTISONE (CORTISPORIN) 1 % SOLN OTIC solution, Place 3 drops into the left ear 4 (four) times daily., Disp: 10 mL, Rfl: 2 .  NON FORMULARY, Carbon-60 two tablespoons daily, Disp: , Rfl:  .  NON FORMULARY, Serrapeptase 3 capsules daily, Disp: , Rfl:  .  NON FORMULARY, Curcumin two capsules in AM, Disp: , Rfl:  .  OVER THE COUNTER MEDICATION, Take 1 tablet by mouth every morning. , Disp: , Rfl:  .  PREMARIN vaginal cream, Place  1 Applicatorful vaginally 2 (two) times a week. Monday and Wednesday or Thursday., Disp: , Rfl: 3 .  propranolol (INDERAL) 80 MG tablet, Take 80 mg by mouth every morning. , Disp: , Rfl:  .  ursodiol (ACTIGALL) 300 MG capsule, Take 300 mg by mouth 2 (two) times daily., Disp: , Rfl: 3 .  vitamin B-12 (CYANOCOBALAMIN) 1000 MCG tablet, Take 1,000 mcg by mouth daily. Take 2 tablets of 1000 mcg, a total of 2000 mcg once a day, per patient., Disp: , Rfl:   Allergies:  Allergies  Allergen Reactions  . Caine-1 [Lidocaine Hcl] Anaphylaxis  . Lidocaine Hcl Anaphylaxis  . Morphine Anaphylaxis and Other (See Comments)  . Piperacillin Sod-Tazobactam So Itching    "scalp itch"-was given Benadryl to counteract. "scalp itch"-was given Benadryl to counteract. "scalp itch"-was given Benadryl to counteract.  .  Codeine Nausea And Vomiting and Other (See Comments)  . Cyclosporine Other (See Comments)    burns burns burns  . Hydrocodone-Acetaminophen Nausea And Vomiting  . Hydromorphone Hcl Nausea And Vomiting    Can take with zofran  . Oxycodone-Acetaminophen Nausea And Vomiting  . Tramadol Nausea And Vomiting  . Dilaudid [Hydromorphone Hcl] Nausea And Vomiting    Can take with zofran  . Morphine And Related Nausea And Vomiting  . Percocet [Oxycodone-Acetaminophen] Nausea And Vomiting  . Vicodin [Hydrocodone-Acetaminophen] Nausea And Vomiting    Past Medical History, Surgical history, Social history, and Family History were reviewed and updated.  Review of Systems: Review of Systems  Constitutional: Positive for malaise/fatigue.  HENT: Negative.   Eyes: Negative.   Respiratory: Negative.   Cardiovascular: Negative.   Gastrointestinal: Positive for abdominal pain and diarrhea.  Genitourinary: Negative.   Musculoskeletal: Positive for myalgias.  Skin: Negative.   Neurological: Negative.   Endo/Heme/Allergies: Negative.   Psychiatric/Behavioral: Negative.      Physical Exam:  weight is 153 lb (69.4 kg). Her oral temperature is 98.2 F (36.8 C). Her blood pressure is 137/75 and her pulse is 77. Her respiration is 16 and oxygen saturation is 96%.   Physical Exam  Constitutional: She is oriented to person, place, and time.  HENT:  Head: Normocephalic and atraumatic.  Mouth/Throat: Oropharynx is clear and moist.  Eyes: Pupils are equal, round, and reactive to light. EOM are normal.  Neck: Normal range of motion.  Cardiovascular: Normal rate, regular rhythm and normal heart sounds.  Pulmonary/Chest: Effort normal and breath sounds normal.  Abdominal: Soft. Bowel sounds are normal.    Musculoskeletal: Normal range of motion. She exhibits no edema, tenderness or deformity.  Lymphadenopathy:    She has no cervical adenopathy.  Neurological: She is alert and oriented to person,  place, and time.  Skin: Skin is warm and dry. No rash noted. No erythema.  Psychiatric: She has a normal mood and affect. Her behavior is normal. Judgment and thought content normal.  Vitals reviewed.   Lab Results  Component Value Date   WBC 4.4 11/15/2017   HGB 15.0 11/15/2017   HCT 43.4 11/15/2017   MCV 97.5 11/15/2017   PLT 178 11/15/2017     Chemistry      Component Value Date/Time   NA 144 10/04/2017 1116   NA 144 04/04/2017 1139   NA 140 09/07/2016 0946   K 4.1 10/04/2017 1116   K 3.5 04/04/2017 1139   K 4.1 09/07/2016 0946   CL 104 10/04/2017 1116   CL 103 04/04/2017 1139   CO2 30 10/04/2017 1116   CO2 33  04/04/2017 1139   CO2 28 09/07/2016 0946   BUN 17 10/04/2017 1116   BUN 15 04/04/2017 1139   BUN 22.6 09/07/2016 0946   CREATININE 0.70 10/04/2017 1116   CREATININE 0.9 04/04/2017 1139   CREATININE 0.8 09/07/2016 0946      Component Value Date/Time   CALCIUM 9.4 10/04/2017 1116   CALCIUM 9.5 04/04/2017 1139   CALCIUM 9.6 09/07/2016 0946   ALKPHOS 79 10/04/2017 1116   ALKPHOS 93 (H) 04/04/2017 1139   ALKPHOS 81 09/07/2016 0946   AST 31 10/04/2017 1116   AST 32 09/07/2016 0946   ALT 27 10/04/2017 1116   ALT 36 04/04/2017 1139   ALT 33 09/07/2016 0946   BILITOT 1.5 10/04/2017 1116   BILITOT 1.34 (H) 09/07/2016 0946      INR is 1.81  Impression and Plan: Ms. Mccaul is 72 year old white female with the anti-phospholipid antibody syndrome. This really has not been a problem for her. The bigger issue has been the hepatic problems and her diabetes. She has steatosis. She has NASH.  For right now, we will have to focus on this tooth extractions.  Again we have to make sure that her INR is not going to be a problem.  We will see her back on August 22.  The MRI of the abdomen for the pancreatic mass we will follow-up in about 2-3 months.  [name], MD 8/1/201912:10 PM

## 2017-11-16 ENCOUNTER — Encounter: Payer: Self-pay | Admitting: *Deleted

## 2017-11-16 LAB — COMPREHENSIVE METABOLIC PANEL
ALT: 23 U/L (ref 0–44)
AST: 26 U/L (ref 15–41)
Albumin: 3.8 g/dL (ref 3.5–5.0)
Alkaline Phosphatase: 77 U/L (ref 38–126)
Anion gap: 8 (ref 5–15)
BUN: 16 mg/dL (ref 8–23)
CO2: 29 mmol/L (ref 22–32)
Calcium: 9.5 mg/dL (ref 8.9–10.3)
Chloride: 103 mmol/L (ref 98–111)
Creatinine, Ser: 0.66 mg/dL (ref 0.44–1.00)
GFR calc Af Amer: >60 mL/min (ref >60)
GFR calc non Af Amer: >60 mL/min (ref >60)
Glucose, Bld: 176 mg/dL — ABNORMAL HIGH (ref 70–99)
Potassium: 3.8 mmol/L (ref 3.5–5.1)
Sodium: 140 mmol/L (ref 135–145)
Total Bilirubin: 1.3 mg/dL — ABNORMAL HIGH (ref 0.3–1.2)
Total Protein: 6.8 g/dL (ref 6.5–8.1)

## 2017-11-16 LAB — CANCER ANTIGEN 19-9: CA 19-9: 25 U/mL (ref 0–35)

## 2017-11-16 LAB — TSH: TSH: 1.908 u[IU]/mL (ref 0.308–3.960)

## 2017-12-10 ENCOUNTER — Inpatient Hospital Stay: Payer: PPO

## 2017-12-10 ENCOUNTER — Inpatient Hospital Stay (HOSPITAL_BASED_OUTPATIENT_CLINIC_OR_DEPARTMENT_OTHER): Payer: PPO | Admitting: Hematology & Oncology

## 2017-12-10 ENCOUNTER — Encounter: Payer: Self-pay | Admitting: Hematology & Oncology

## 2017-12-10 ENCOUNTER — Other Ambulatory Visit: Payer: Self-pay

## 2017-12-10 DIAGNOSIS — Z79899 Other long term (current) drug therapy: Secondary | ICD-10-CM | POA: Diagnosis not present

## 2017-12-10 DIAGNOSIS — I82401 Acute embolism and thrombosis of unspecified deep veins of right lower extremity: Secondary | ICD-10-CM

## 2017-12-10 DIAGNOSIS — E0800 Diabetes mellitus due to underlying condition with hyperosmolarity without nonketotic hyperglycemic-hyperosmolar coma (NKHHC): Secondary | ICD-10-CM

## 2017-12-10 DIAGNOSIS — R5383 Other fatigue: Secondary | ICD-10-CM | POA: Diagnosis not present

## 2017-12-10 DIAGNOSIS — Z794 Long term (current) use of insulin: Secondary | ICD-10-CM

## 2017-12-10 DIAGNOSIS — R531 Weakness: Secondary | ICD-10-CM | POA: Diagnosis not present

## 2017-12-10 DIAGNOSIS — Z86718 Personal history of other venous thrombosis and embolism: Secondary | ICD-10-CM | POA: Diagnosis not present

## 2017-12-10 DIAGNOSIS — R1900 Intra-abdominal and pelvic swelling, mass and lump, unspecified site: Secondary | ICD-10-CM

## 2017-12-10 DIAGNOSIS — D6861 Antiphospholipid syndrome: Secondary | ICD-10-CM

## 2017-12-10 DIAGNOSIS — E119 Type 2 diabetes mellitus without complications: Secondary | ICD-10-CM

## 2017-12-10 DIAGNOSIS — Z7901 Long term (current) use of anticoagulants: Secondary | ICD-10-CM

## 2017-12-10 DIAGNOSIS — K7581 Nonalcoholic steatohepatitis (NASH): Secondary | ICD-10-CM | POA: Diagnosis not present

## 2017-12-10 LAB — CBC WITH DIFFERENTIAL (CANCER CENTER ONLY)
Basophils Absolute: 0.1 10*3/uL (ref 0.0–0.1)
Basophils Relative: 1 %
Eosinophils Absolute: 0.3 10*3/uL (ref 0.0–0.5)
Eosinophils Relative: 4 %
HCT: 43.3 % (ref 34.8–46.6)
Hemoglobin: 14.8 g/dL (ref 11.6–15.9)
Lymphocytes Relative: 37 %
Lymphs Abs: 2.3 10*3/uL (ref 0.9–3.3)
MCH: 33.6 pg (ref 26.0–34.0)
MCHC: 34.2 g/dL (ref 32.0–36.0)
MCV: 98.4 fL (ref 81.0–101.0)
Monocytes Absolute: 0.8 10*3/uL (ref 0.1–0.9)
Monocytes Relative: 12 %
Neutro Abs: 2.8 10*3/uL (ref 1.5–6.5)
Neutrophils Relative %: 46 %
Platelet Count: 289 10*3/uL (ref 145–400)
RBC: 4.4 MIL/uL (ref 3.70–5.32)
RDW: 13.1 % (ref 11.1–15.7)
WBC Count: 6.2 10*3/uL (ref 3.9–10.0)

## 2017-12-10 LAB — CMP (CANCER CENTER ONLY)
ALT: 26 U/L (ref 10–47)
AST: 30 U/L (ref 11–38)
Albumin: 3.6 g/dL (ref 3.5–5.0)
Alkaline Phosphatase: 89 U/L — ABNORMAL HIGH (ref 26–84)
Anion gap: 8 (ref 5–15)
BUN: 17 mg/dL (ref 7–22)
CO2: 32 mmol/L (ref 18–33)
Calcium: 9.6 mg/dL (ref 8.0–10.3)
Chloride: 104 mmol/L (ref 98–108)
Creatinine: 0.7 mg/dL (ref 0.60–1.20)
Glucose, Bld: 137 mg/dL — ABNORMAL HIGH (ref 73–118)
Potassium: 3.9 mmol/L (ref 3.3–4.7)
Sodium: 144 mmol/L (ref 128–145)
Total Bilirubin: 1.8 mg/dL — ABNORMAL HIGH (ref 0.2–1.6)
Total Protein: 6.7 g/dL (ref 6.4–8.1)

## 2017-12-10 LAB — PROTIME-INR
INR: 1.25
Prothrombin Time: 15.6 seconds — ABNORMAL HIGH (ref 11.4–15.2)

## 2017-12-10 MED ORDER — AMOXICILLIN 500 MG PO TABS: ORAL_TABLET | ORAL | 1 refills | Status: DC

## 2017-12-10 MED ORDER — PREMARIN 0.625 MG/GM VA CREA: 1.0000 | TOPICAL_CREAM | VAGINAL | 3 refills | Status: DC

## 2017-12-10 NOTE — Progress Notes (Signed)
Hematology and Oncology Follow Up Visit  LORELL THIBODAUX [id number] 08-23-1945 72 y.o. 12/10/2017   Principle Diagnosis:  1. Antiphospholipid antibody syndrome. 2. History of recurrent lower extremity deep venous thrombosis. 3. Nonalcoholic steatohepatitis.  Current Therapy:   Coumadin to maintain an INR between 2-3.     Interim History:  Ms.  Kozakiewicz is back for followup.  She goes for her dental surgery tomorrow.  She will have extractions tomorrow.  I really think that she needs to be on pre-op antibiotics.  I will go ahead and call in some amoxicillin for her.  I told her to take 4 tablets 1 hour prior to her oral surgery.  Her labs look really good.  She stopped the Coumadin 3 days ago.  Her INR is pending but I would have to think that this is going to be okay.  She has had no fever.  She has had no bleeding.  Her legs have been feeling okay.  She has had no leg swelling.  Overall, her performance status is ECOG 1.   Medications:  Current Outpatient Medications:  .  benazepril (LOTENSIN) 20 MG tablet, Take 20 mg by mouth every morning. , Disp: , Rfl:  .  Biotin 10000 MCG TABS, Take 10,000 mcg by mouth daily., Disp: , Rfl:  .  Cholecalciferol (VITAMIN D3) 2000 UNITS capsule, Take 2,000 Units by mouth daily., Disp: , Rfl:  .  ciprofloxacin (CIPRO) 500 MG tablet, Take 1 tablet (500 mg total) by mouth 2 (two) times daily. For 5 days., Disp: 10 tablet, Rfl: 0 .  Continuous Blood Gluc Receiver (FREESTYLE LIBRE 88 DAY READER) DEVI, See admin instructions., Disp: , Rfl: 0 .  Continuous Blood Gluc Sensor (FREESTYLE LIBRE 14 DAY SENSOR) MISC, See admin instructions., Disp: , Rfl: 5 .  COUMADIN 10 MG tablet, Take 1 tablet (10 mg total) by mouth daily., Disp: 30 tablet, Rfl: 2 .  COUMADIN 7.5 MG tablet, TAKE 1 TABLET (7.5 MG TOTAL) BY MOUTH DAILY., Disp: 90 tablet, Rfl: 3 .  glucose 4 GM chewable tablet, Chew 16 g by mouth as needed for low blood sugar., Disp: , Rfl:  .  insulin  glargine (LANTUS) 100 UNIT/ML injection, Inject 18 Units into the skin at bedtime. If CBG greater than 120, take 19 units., Disp: , Rfl:  .  NEOMYCIN-POLYMYXIN-HYDROCORTISONE (CORTISPORIN) 1 % SOLN OTIC solution, Place 3 drops into the left ear 4 (four) times daily., Disp: 10 mL, Rfl: 2 .  NON FORMULARY, Carbon-60 two tablespoons daily, Disp: , Rfl:  .  NON FORMULARY, Serrapeptase 3 capsules daily, Disp: , Rfl:  .  NON FORMULARY, Curcumin two capsules in AM, Disp: , Rfl:  .  OVER THE COUNTER MEDICATION, Take 1 tablet by mouth every morning. , Disp: , Rfl:  .  PREMARIN vaginal cream, Place 1 Applicatorful vaginally 2 (two) times a week. Monday and Wednesday or Thursday., Disp: , Rfl: 3 .  propranolol (INDERAL) 80 MG tablet, Take 80 mg by mouth every morning. , Disp: , Rfl:  .  ursodiol (ACTIGALL) 300 MG capsule, Take 300 mg by mouth 2 (two) times daily., Disp: , Rfl: 3 .  vitamin B-12 (CYANOCOBALAMIN) 1000 MCG tablet, Take 1,000 mcg by mouth daily. Take 2 tablets of 1000 mcg, a total of 2000 mcg once a day, per patient., Disp: , Rfl:   Allergies:  Allergies  Allergen Reactions  . Caine-1 [Lidocaine Hcl] Anaphylaxis  . Lidocaine Hcl Anaphylaxis  . Morphine Anaphylaxis and Other (See Comments)  .  Piperacillin Sod-Tazobactam So Itching    "scalp itch"-was given Benadryl to counteract. "scalp itch"-was given Benadryl to counteract. "scalp itch"-was given Benadryl to counteract.  . Codeine Nausea And Vomiting and Other (See Comments)  . Cyclosporine Other (See Comments)    burns burns burns  . Hydrocodone-Acetaminophen Nausea And Vomiting  . Hydromorphone Hcl Nausea And Vomiting    Can take with zofran  . Oxycodone-Acetaminophen Nausea And Vomiting  . Tramadol Nausea And Vomiting  . Dilaudid [Hydromorphone Hcl] Nausea And Vomiting    Can take with zofran  . Morphine And Related Nausea And Vomiting  . Percocet [Oxycodone-Acetaminophen] Nausea And Vomiting  . Vicodin  [Hydrocodone-Acetaminophen] Nausea And Vomiting    Past Medical History, Surgical history, Social history, and Family History were reviewed and updated.  Review of Systems: Review of Systems  Constitutional: Positive for malaise/fatigue.  HENT: Negative.   Eyes: Negative.   Respiratory: Negative.   Cardiovascular: Negative.   Gastrointestinal: Positive for abdominal pain and diarrhea.  Genitourinary: Negative.   Musculoskeletal: Positive for myalgias.  Skin: Negative.   Neurological: Negative.   Endo/Heme/Allergies: Negative.   Psychiatric/Behavioral: Negative.      Physical Exam:  vitals were not taken for this visit.   Physical Exam  Constitutional: She is oriented to person, place, and time.  HENT:  Head: Normocephalic and atraumatic.  Mouth/Throat: Oropharynx is clear and moist.  Eyes: Pupils are equal, round, and reactive to light. EOM are normal.  Neck: Normal range of motion.  Cardiovascular: Normal rate, regular rhythm and normal heart sounds.  Pulmonary/Chest: Effort normal and breath sounds normal.  Abdominal: Soft. Bowel sounds are normal.    Musculoskeletal: Normal range of motion. She exhibits no edema, tenderness or deformity.  Lymphadenopathy:    She has no cervical adenopathy.  Neurological: She is alert and oriented to person, place, and time.  Skin: Skin is warm and dry. No rash noted. No erythema.  Psychiatric: She has a normal mood and affect. Her behavior is normal. Judgment and thought content normal.  Vitals reviewed.   Lab Results  Component Value Date   WBC 6.2 12/10/2017   HGB 14.8 12/10/2017   HCT 43.3 12/10/2017   MCV 98.4 12/10/2017   PLT 289 12/10/2017     Chemistry      Component Value Date/Time   NA 140 11/15/2017 1651   NA 144 04/04/2017 1139   NA 140 09/07/2016 0946   K 3.8 11/15/2017 1651   K 3.5 04/04/2017 1139   K 4.1 09/07/2016 0946   CL 103 11/15/2017 1651   CL 103 04/04/2017 1139   CO2 29 11/15/2017 1651   CO2  33 04/04/2017 1139   CO2 28 09/07/2016 0946   BUN 16 11/15/2017 1651   BUN 15 04/04/2017 1139   BUN 22.6 09/07/2016 0946   CREATININE 0.66 11/15/2017 1651   CREATININE 0.70 10/04/2017 1116   CREATININE 0.9 04/04/2017 1139   CREATININE 0.8 09/07/2016 0946      Component Value Date/Time   CALCIUM 9.5 11/15/2017 1651   CALCIUM 9.5 04/04/2017 1139   CALCIUM 9.6 09/07/2016 0946   ALKPHOS 77 11/15/2017 1651   ALKPHOS 93 (H) 04/04/2017 1139   ALKPHOS 81 09/07/2016 0946   AST 26 11/15/2017 1651   AST 31 10/04/2017 1116   AST 32 09/07/2016 0946   ALT 23 11/15/2017 1651   ALT 27 10/04/2017 1116   ALT 36 04/04/2017 1139   ALT 33 09/07/2016 0946   BILITOT 1.3 (H) 11/15/2017 1651  BILITOT 1.5 10/04/2017 1116   BILITOT 1.34 (H) 09/07/2016 0946       Impression and Plan: Ms. Riggan is 72 year old white female with the anti-phospholipid antibody syndrome. This really has not been a problem for her. The bigger issue has been the hepatic problems and her diabetes. She has steatosis. She has NASH.  We will go ahead and see how she does with her oral surgery.  I think that she should do okay.  Again, she is off the Coumadin 3 days ago.  We will see what her INR is.  I will plan to get her back to see me in another 4 weeks or so.  She and her family are going to the beach for [place] weekend.    [name], MD 8/26/20198:44 AM

## 2017-12-18 ENCOUNTER — Other Ambulatory Visit: Payer: Self-pay | Admitting: Hematology & Oncology

## 2017-12-18 DIAGNOSIS — D6861 Antiphospholipid syndrome: Secondary | ICD-10-CM

## 2017-12-18 DIAGNOSIS — I82409 Acute embolism and thrombosis of unspecified deep veins of unspecified lower extremity: Secondary | ICD-10-CM

## 2017-12-24 DIAGNOSIS — R000 Tachycardia, unspecified: Secondary | ICD-10-CM | POA: Diagnosis not present

## 2017-12-24 DIAGNOSIS — I10 Essential (primary) hypertension: Secondary | ICD-10-CM | POA: Diagnosis not present

## 2017-12-24 DIAGNOSIS — E782 Mixed hyperlipidemia: Secondary | ICD-10-CM | POA: Diagnosis not present

## 2017-12-24 DIAGNOSIS — E1165 Type 2 diabetes mellitus with hyperglycemia: Secondary | ICD-10-CM | POA: Diagnosis not present

## 2017-12-26 ENCOUNTER — Other Ambulatory Visit (HOSPITAL_COMMUNITY): Payer: Self-pay | Admitting: Family Medicine

## 2017-12-26 DIAGNOSIS — R000 Tachycardia, unspecified: Secondary | ICD-10-CM

## 2017-12-31 ENCOUNTER — Other Ambulatory Visit: Payer: Self-pay

## 2017-12-31 ENCOUNTER — Ambulatory Visit (HOSPITAL_COMMUNITY): Payer: PPO | Attending: Family Medicine

## 2017-12-31 DIAGNOSIS — I071 Rheumatic tricuspid insufficiency: Secondary | ICD-10-CM | POA: Insufficient documentation

## 2017-12-31 DIAGNOSIS — E119 Type 2 diabetes mellitus without complications: Secondary | ICD-10-CM | POA: Insufficient documentation

## 2017-12-31 DIAGNOSIS — R011 Cardiac murmur, unspecified: Secondary | ICD-10-CM | POA: Insufficient documentation

## 2017-12-31 DIAGNOSIS — R000 Tachycardia, unspecified: Secondary | ICD-10-CM | POA: Insufficient documentation

## 2018-01-14 ENCOUNTER — Inpatient Hospital Stay: Payer: PPO

## 2018-01-14 ENCOUNTER — Inpatient Hospital Stay: Payer: PPO | Attending: Hematology & Oncology | Admitting: Hematology & Oncology

## 2018-01-14 ENCOUNTER — Encounter: Payer: Self-pay | Admitting: Hematology & Oncology

## 2018-01-14 ENCOUNTER — Other Ambulatory Visit: Payer: Self-pay

## 2018-01-14 VITALS — BP 137/71 | HR 61 | Temp 98.4°F | Resp 16 | Wt 154.0 lb

## 2018-01-14 DIAGNOSIS — D6861 Antiphospholipid syndrome: Secondary | ICD-10-CM | POA: Insufficient documentation

## 2018-01-14 DIAGNOSIS — E119 Type 2 diabetes mellitus without complications: Secondary | ICD-10-CM

## 2018-01-14 DIAGNOSIS — R531 Weakness: Secondary | ICD-10-CM | POA: Diagnosis not present

## 2018-01-14 DIAGNOSIS — Z794 Long term (current) use of insulin: Secondary | ICD-10-CM

## 2018-01-14 DIAGNOSIS — Z86718 Personal history of other venous thrombosis and embolism: Secondary | ICD-10-CM | POA: Diagnosis not present

## 2018-01-14 DIAGNOSIS — R1031 Right lower quadrant pain: Secondary | ICD-10-CM | POA: Insufficient documentation

## 2018-01-14 DIAGNOSIS — Z79899 Other long term (current) drug therapy: Secondary | ICD-10-CM | POA: Insufficient documentation

## 2018-01-14 DIAGNOSIS — R5383 Other fatigue: Secondary | ICD-10-CM | POA: Diagnosis not present

## 2018-01-14 DIAGNOSIS — K7581 Nonalcoholic steatohepatitis (NASH): Secondary | ICD-10-CM | POA: Diagnosis not present

## 2018-01-14 DIAGNOSIS — Z7901 Long term (current) use of anticoagulants: Secondary | ICD-10-CM

## 2018-01-14 LAB — CMP (CANCER CENTER ONLY)
ALT: 25 U/L (ref 10–47)
AST: 30 U/L (ref 11–38)
Albumin: 3.8 g/dL (ref 3.5–5.0)
Alkaline Phosphatase: 86 U/L — ABNORMAL HIGH (ref 26–84)
Anion gap: 1 — ABNORMAL LOW (ref 5–15)
BUN: 18 mg/dL (ref 7–22)
CO2: 32 mmol/L (ref 18–33)
Calcium: 9.8 mg/dL (ref 8.0–10.3)
Chloride: 109 mmol/L — ABNORMAL HIGH (ref 98–108)
Creatinine: 0.8 mg/dL (ref 0.60–1.20)
Glucose, Bld: 165 mg/dL — ABNORMAL HIGH (ref 73–118)
Potassium: 4.9 mmol/L — ABNORMAL HIGH (ref 3.3–4.7)
Sodium: 142 mmol/L (ref 128–145)
Total Bilirubin: 1.6 mg/dL (ref 0.2–1.6)
Total Protein: 6.8 g/dL (ref 6.4–8.1)

## 2018-01-14 LAB — CBC WITH DIFFERENTIAL (CANCER CENTER ONLY)
Basophils Absolute: 0.1 10*3/uL (ref 0.0–0.1)
Basophils Relative: 1 %
Eosinophils Absolute: 0.3 10*3/uL (ref 0.0–0.5)
Eosinophils Relative: 6 %
HCT: 43.1 % (ref 34.8–46.6)
Hemoglobin: 14.9 g/dL (ref 11.6–15.9)
Lymphocytes Relative: 34 %
Lymphs Abs: 1.7 10*3/uL (ref 0.9–3.3)
MCH: 33.9 pg (ref 26.0–34.0)
MCHC: 34.6 g/dL (ref 32.0–36.0)
MCV: 98.2 fL (ref 81.0–101.0)
Monocytes Absolute: 0.7 10*3/uL (ref 0.1–0.9)
Monocytes Relative: 13 %
Neutro Abs: 2.3 10*3/uL (ref 1.5–6.5)
Neutrophils Relative %: 46 %
Platelet Count: 287 10*3/uL (ref 145–400)
RBC: 4.39 MIL/uL (ref 3.70–5.32)
RDW: 13 % (ref 11.1–15.7)
WBC Count: 5.1 10*3/uL (ref 3.9–10.0)

## 2018-01-14 LAB — PROTIME-INR
INR: 1.53
Prothrombin Time: 18.2 seconds — ABNORMAL HIGH (ref 11.4–15.2)

## 2018-01-14 NOTE — Progress Notes (Signed)
Hematology and Oncology Follow Up Visit  Laurie Green [id number] [birth year] 72 y.o. 01/14/2018   Principle Diagnosis:  1. Antiphospholipid antibody syndrome. 2. History of recurrent lower extremity deep venous thrombosis. 3. Nonalcoholic steatohepatitis.  Current Therapy:   Coumadin - 7.5 mg po q day -  to maintain an INR between 2-3.     Interim History:  Ms.  Bogdanski is back for followup.  She is having some difficulties.  She had oral surgery in a couple weeks ago.  She had quite a tough time with this.  Thankfully, there is no bleeding.  She goes back in about 3 weeks for a follow-up.  She is back on Coumadin.  Her INR today is 1.5.  I told her to take 10 mg today and tomorrow.  We will recheck her INR in 1 week.  She is had no problems of bleeding.  She had a bout of right lower quadrant abdominal pain.  She cannot get in with her gastroenterologist.  She was not happy about this.  She is had no cough.  She is had no leg swelling.  There is been no fever.  She has had no change in bowel or bladder habits.  Overall, her performance status is ECOG 1.  Medications:  Current Outpatient Medications:  .  amoxicillin (AMOXIL) 500 MG tablet, Take 4 tablets 1 hour PRIOR to dental surgery, Disp: 4 tablet, Rfl: 1 .  benazepril (LOTENSIN) 20 MG tablet, Take 20 mg by mouth every morning. , Disp: , Rfl:  .  Biotin 10000 MCG TABS, Take 10,000 mcg by mouth daily., Disp: , Rfl:  .  Cholecalciferol (VITAMIN D3) 2000 UNITS capsule, Take 2,000 Units by mouth daily., Disp: , Rfl:  .  ciprofloxacin (CIPRO) 500 MG tablet, Take 1 tablet (500 mg total) by mouth 2 (two) times daily. For 5 days., Disp: 10 tablet, Rfl: 0 .  Continuous Blood Gluc Receiver (FREESTYLE LIBRE 69 DAY READER) DEVI, See admin instructions., Disp: , Rfl: 0 .  Continuous Blood Gluc Sensor (FREESTYLE LIBRE 14 DAY SENSOR) MISC, See admin instructions., Disp: , Rfl: 5 .  COUMADIN 10 MG tablet, TAKE 1 TABLET BY MOUTH EVERY DAY, Disp:  90 tablet, Rfl: 0 .  COUMADIN 7.5 MG tablet, TAKE 1 TABLET (7.5 MG TOTAL) BY MOUTH DAILY., Disp: 90 tablet, Rfl: 3 .  glucose 4 GM chewable tablet, Chew 16 g by mouth as needed for low blood sugar., Disp: , Rfl:  .  insulin glargine (LANTUS) 100 UNIT/ML injection, Inject 18 Units into the skin at bedtime. If CBG greater than 120, take 19 units., Disp: , Rfl:  .  NEOMYCIN-POLYMYXIN-HYDROCORTISONE (CORTISPORIN) 1 % SOLN OTIC solution, Place 3 drops into the left ear 4 (four) times daily., Disp: 10 mL, Rfl: 2 .  NON FORMULARY, Carbon-60 two tablespoons daily, Disp: , Rfl:  .  NON FORMULARY, Serrapeptase 3 capsules daily, Disp: , Rfl:  .  NON FORMULARY, Curcumin two capsules in AM, Disp: , Rfl:  .  OVER THE COUNTER MEDICATION, Take 1 tablet by mouth every morning. , Disp: , Rfl:  .  PREMARIN vaginal cream, Place 1 Applicatorful vaginally 2 (two) times a week. Monday and Wednesday or Thursday., Disp: 42.5 g, Rfl: 3 .  propranolol (INDERAL) 80 MG tablet, Take 80 mg by mouth every morning. , Disp: , Rfl:  .  ursodiol (ACTIGALL) 300 MG capsule, Take 300 mg by mouth 2 (two) times daily., Disp: , Rfl: 3 .  vitamin B-12 (CYANOCOBALAMIN) 1000  MCG tablet, Take 1,000 mcg by mouth daily. Take 2 tablets of 1000 mcg, a total of 2000 mcg once a day, per patient., Disp: , Rfl:   Allergies:  Allergies  Allergen Reactions  . Caine-1 [Lidocaine Hcl] Anaphylaxis  . Lidocaine Hcl Anaphylaxis  . Morphine Anaphylaxis and Other (See Comments)  . Piperacillin Sod-Tazobactam So Itching    "scalp itch"-was given Benadryl to counteract. "scalp itch"-was given Benadryl to counteract. "scalp itch"-was given Benadryl to counteract.  . Codeine Nausea And Vomiting and Other (See Comments)  . Cyclosporine Other (See Comments)    burns burns burns  . Hydrocodone-Acetaminophen Nausea And Vomiting  . Hydromorphone Hcl Nausea And Vomiting    Can take with zofran  . Oxycodone-Acetaminophen Nausea And Vomiting  . Tramadol  Nausea And Vomiting  . Dilaudid [Hydromorphone Hcl] Nausea And Vomiting    Can take with zofran  . Morphine And Related Nausea And Vomiting  . Percocet [Oxycodone-Acetaminophen] Nausea And Vomiting  . Vicodin [Hydrocodone-Acetaminophen] Nausea And Vomiting    Past Medical History, Surgical history, Social history, and Family History were reviewed and updated.  Review of Systems: Review of Systems  Constitutional: Positive for malaise/fatigue.  HENT: Negative.   Eyes: Negative.   Respiratory: Negative.   Cardiovascular: Negative.   Gastrointestinal: Positive for abdominal pain and diarrhea.  Genitourinary: Negative.   Musculoskeletal: Positive for myalgias.  Skin: Negative.   Neurological: Negative.   Endo/Heme/Allergies: Negative.   Psychiatric/Behavioral: Negative.      Physical Exam:  weight is 154 lb (69.9 kg). Her oral temperature is 98.4 F (36.9 C). Her blood pressure is 137/71 and her pulse is 61. Her respiration is 16 and oxygen saturation is 96%.   Physical Exam  Constitutional: She is oriented to person, place, and time.  HENT:  Head: Normocephalic and atraumatic.  Mouth/Throat: Oropharynx is clear and moist.  Eyes: Pupils are equal, round, and reactive to light. EOM are normal.  Neck: Normal range of motion.  Cardiovascular: Normal rate, regular rhythm and normal heart sounds.  Pulmonary/Chest: Effort normal and breath sounds normal.  Abdominal: Soft. Bowel sounds are normal.    Musculoskeletal: Normal range of motion. She exhibits no edema, tenderness or deformity.  Lymphadenopathy:    She has no cervical adenopathy.  Neurological: She is alert and oriented to person, place, and time.  Skin: Skin is warm and dry. No rash noted. No erythema.  Psychiatric: She has a normal mood and affect. Her behavior is normal. Judgment and thought content normal.  Vitals reviewed.   Lab Results  Component Value Date   WBC 5.1 01/14/2018   HGB 14.9 01/14/2018   HCT  43.1 01/14/2018   MCV 98.2 01/14/2018   PLT 287 01/14/2018     Chemistry      Component Value Date/Time   NA 142 01/14/2018 0836   NA 144 04/04/2017 1139   NA 140 09/07/2016 0946   K 4.9 (H) 01/14/2018 0836   K 3.5 04/04/2017 1139   K 4.1 09/07/2016 0946   CL 109 (H) 01/14/2018 0836   CL 103 04/04/2017 1139   CO2 32 01/14/2018 0836   CO2 33 04/04/2017 1139   CO2 28 09/07/2016 0946   BUN 18 01/14/2018 0836   BUN 15 04/04/2017 1139   BUN 22.6 09/07/2016 0946   CREATININE 0.80 01/14/2018 0836   CREATININE 0.9 04/04/2017 1139   CREATININE 0.8 09/07/2016 0946      Component Value Date/Time   CALCIUM 9.8 01/14/2018 0836  CALCIUM 9.5 04/04/2017 1139   CALCIUM 9.6 09/07/2016 0946   ALKPHOS 86 (H) 01/14/2018 0836   ALKPHOS 93 (H) 04/04/2017 1139   ALKPHOS 81 09/07/2016 0946   AST 30 01/14/2018 0836   AST 32 09/07/2016 0946   ALT 25 01/14/2018 0836   ALT 36 04/04/2017 1139   ALT 33 09/07/2016 0946   BILITOT 1.6 01/14/2018 0836   BILITOT 1.34 (H) 09/07/2016 0946       Impression and Plan: Ms. Lagrange is 72 year old white female with the anti-phospholipid antibody syndrome. This really has not been a problem for her. The bigger issue has been the hepatic problems and her diabetes. She has steatosis. She has NASH.  Her big 72th birthday is coming up in 3 weeks.  She wants this to be a quiet day for her. I am sure that she will need further surgery for her mouth.  If so, she will be back on antibiotics.  I would like to see her back in about 5 weeks or so.  Again, we will check her INR in 1 week.  Marland Kitchen    [name], MD 9/30/20199:58 AM

## 2018-01-21 ENCOUNTER — Inpatient Hospital Stay: Payer: PPO | Attending: Hematology & Oncology

## 2018-01-21 DIAGNOSIS — D6861 Antiphospholipid syndrome: Secondary | ICD-10-CM | POA: Diagnosis not present

## 2018-01-21 LAB — PROTIME-INR
INR: 1.57
Prothrombin Time: 18.6 seconds — ABNORMAL HIGH (ref 11.4–15.2)

## 2018-01-22 ENCOUNTER — Telehealth: Payer: Self-pay | Admitting: *Deleted

## 2018-01-22 NOTE — Telephone Encounter (Addendum)
Patient is aware of results. Appointment made  ----- Message from [name], MD sent at 01/21/2018  3:20 PM EDT ----- Call - INR is still low at 1.57.  Need to re-check INR on Friday!!  [place]

## 2018-01-28 ENCOUNTER — Telehealth: Payer: Self-pay | Admitting: *Deleted

## 2018-01-28 ENCOUNTER — Inpatient Hospital Stay: Payer: PPO

## 2018-01-28 DIAGNOSIS — D6861 Antiphospholipid syndrome: Secondary | ICD-10-CM | POA: Diagnosis not present

## 2018-01-28 LAB — PROTIME-INR
INR: 1.67
Prothrombin Time: 19.6 seconds — ABNORMAL HIGH (ref 11.4–15.2)

## 2018-01-28 NOTE — Telephone Encounter (Addendum)
Patient aware of results. Confirmed medication teaching with teach back. Appointment made.   ----- Message from [name], MD sent at 01/28/2018  3:33 PM EDT ----- Call - INR is still low!!  Take 10 mg daily for next 2 days then take 7.5 mg daily.  Re-check INR in 1 week. Please check INR in 1 week.  Laurie Green

## 2018-02-04 ENCOUNTER — Inpatient Hospital Stay: Payer: PPO

## 2018-02-04 DIAGNOSIS — D6861 Antiphospholipid syndrome: Secondary | ICD-10-CM | POA: Diagnosis not present

## 2018-02-04 LAB — PROTIME-INR
INR: 1.62
Prothrombin Time: 19 seconds — ABNORMAL HIGH (ref 11.4–15.2)

## 2018-03-04 ENCOUNTER — Other Ambulatory Visit: Payer: Self-pay | Admitting: Hematology & Oncology

## 2018-03-04 ENCOUNTER — Other Ambulatory Visit: Payer: Self-pay

## 2018-03-04 ENCOUNTER — Inpatient Hospital Stay: Payer: PPO | Attending: Hematology & Oncology | Admitting: Hematology & Oncology

## 2018-03-04 ENCOUNTER — Encounter: Payer: Self-pay | Admitting: Hematology & Oncology

## 2018-03-04 ENCOUNTER — Inpatient Hospital Stay: Payer: PPO

## 2018-03-04 VITALS — BP 138/77 | HR 55 | Temp 98.6°F | Resp 16 | Wt 152.0 lb

## 2018-03-04 DIAGNOSIS — R5383 Other fatigue: Secondary | ICD-10-CM | POA: Diagnosis not present

## 2018-03-04 DIAGNOSIS — Z79899 Other long term (current) drug therapy: Secondary | ICD-10-CM | POA: Diagnosis not present

## 2018-03-04 DIAGNOSIS — Z794 Long term (current) use of insulin: Secondary | ICD-10-CM | POA: Insufficient documentation

## 2018-03-04 DIAGNOSIS — Z86718 Personal history of other venous thrombosis and embolism: Secondary | ICD-10-CM | POA: Diagnosis not present

## 2018-03-04 DIAGNOSIS — Z7901 Long term (current) use of anticoagulants: Secondary | ICD-10-CM | POA: Insufficient documentation

## 2018-03-04 DIAGNOSIS — E119 Type 2 diabetes mellitus without complications: Secondary | ICD-10-CM | POA: Diagnosis not present

## 2018-03-04 DIAGNOSIS — K7581 Nonalcoholic steatohepatitis (NASH): Secondary | ICD-10-CM | POA: Insufficient documentation

## 2018-03-04 DIAGNOSIS — R531 Weakness: Secondary | ICD-10-CM | POA: Insufficient documentation

## 2018-03-04 DIAGNOSIS — D6861 Antiphospholipid syndrome: Secondary | ICD-10-CM | POA: Diagnosis not present

## 2018-03-04 DIAGNOSIS — I82401 Acute embolism and thrombosis of unspecified deep veins of right lower extremity: Secondary | ICD-10-CM

## 2018-03-04 LAB — CMP (CANCER CENTER ONLY)
ALT: 22 U/L (ref 0–44)
AST: 25 U/L (ref 15–41)
Albumin: 3.7 g/dL (ref 3.5–5.0)
Alkaline Phosphatase: 89 U/L (ref 38–126)
Anion gap: 9 (ref 5–15)
BUN: 16 mg/dL (ref 8–23)
CO2: 30 mmol/L (ref 22–32)
Calcium: 9.5 mg/dL (ref 8.9–10.3)
Chloride: 105 mmol/L (ref 98–111)
Creatinine: 0.87 mg/dL (ref 0.44–1.00)
GFR, Est AFR Am: >60 mL/min (ref >60)
GFR, Estimated: >60 mL/min (ref >60)
Glucose, Bld: 139 mg/dL — ABNORMAL HIGH (ref 70–99)
Potassium: 4.2 mmol/L (ref 3.5–5.1)
Sodium: 144 mmol/L (ref 135–145)
Total Bilirubin: 1.2 mg/dL (ref 0.3–1.2)
Total Protein: 7 g/dL (ref 6.5–8.1)

## 2018-03-04 LAB — CBC WITH DIFFERENTIAL (CANCER CENTER ONLY)
Abs Immature Granulocytes: 0.01 10*3/uL (ref 0.00–0.07)
Basophils Absolute: 0.1 10*3/uL (ref 0.0–0.1)
Basophils Relative: 2 %
Eosinophils Absolute: 0.3 10*3/uL (ref 0.0–0.5)
Eosinophils Relative: 6 %
HCT: 44.2 % (ref 36.0–46.0)
Hemoglobin: 15.1 g/dL — ABNORMAL HIGH (ref 12.0–15.0)
Immature Granulocytes: 0 %
Lymphocytes Relative: 37 %
Lymphs Abs: 2 10*3/uL (ref 0.7–4.0)
MCH: 33.6 pg (ref 26.0–34.0)
MCHC: 34.2 g/dL (ref 30.0–36.0)
MCV: 98.2 fL (ref 80.0–100.0)
Monocytes Absolute: 0.8 10*3/uL (ref 0.1–1.0)
Monocytes Relative: 15 %
Neutro Abs: 2.1 10*3/uL (ref 1.7–7.7)
Neutrophils Relative %: 40 %
Platelet Count: 312 10*3/uL (ref 150–400)
RBC: 4.5 MIL/uL (ref 3.87–5.11)
RDW: 12.7 % (ref 11.5–15.5)
WBC Count: 5.3 10*3/uL (ref 4.0–10.5)
nRBC: 0 % (ref 0.0–0.2)

## 2018-03-04 LAB — PROTIME-INR
INR: 1.24
Prothrombin Time: 15.5 seconds — ABNORMAL HIGH (ref 11.4–15.2)

## 2018-03-04 NOTE — Progress Notes (Signed)
Hematology and Oncology Follow Up Visit  Laurie Green [id number] 1945-09-29 72 y.o. 03/04/2018   Principle Diagnosis:  1. Antiphospholipid antibody syndrome. 2. History of recurrent lower extremity deep venous thrombosis. 3. Nonalcoholic steatohepatitis.  Current Therapy:   Coumadin - 7.5 mg po q day -  to maintain an INR between 2-3.     Interim History:  [name] is back for followup.  She is still getting through the oral surgery.  She had a bone graft which she is having some tough times with.  She I think is having another procedure tomorrow.  She been on and off the Coumadin.  She needs to be off the Coumadin quite a bit because of these oral surgery procedures.  We will check her INR today.  She is had no problems with cough.  Is been no shortness of breath.  She has had no abdominal pain.  There is been no change in bowel or bladder habits.  She had no leg swelling.  She has had no nausea or vomiting.  There is been no issues with her pancreas.    Overall, her performance status is ECOG 1.  Medications:  Current Outpatient Medications:  .  amoxicillin (AMOXIL) 500 MG tablet, Take 4 tablets 1 hour PRIOR to dental surgery, Disp: 4 tablet, Rfl: 1 .  benazepril (LOTENSIN) 20 MG tablet, Take 20 mg by mouth every morning. , Disp: , Rfl:  .  Biotin 10000 MCG TABS, Take 10,000 mcg by mouth daily., Disp: , Rfl:  .  Cholecalciferol (VITAMIN D3) 2000 UNITS capsule, Take 2,000 Units by mouth daily., Disp: , Rfl:  .  ciprofloxacin (CIPRO) 500 MG tablet, Take 1 tablet (500 mg total) by mouth 2 (two) times daily. For 5 days., Disp: 10 tablet, Rfl: 0 .  Continuous Blood Gluc Receiver (FREESTYLE LIBRE 66 DAY READER) DEVI, See admin instructions., Disp: , Rfl: 0 .  Continuous Blood Gluc Sensor (FREESTYLE LIBRE 14 DAY SENSOR) MISC, See admin instructions., Disp: , Rfl: 5 .  COUMADIN 10 MG tablet, TAKE 1 TABLET BY MOUTH EVERY DAY, Disp: 90 tablet, Rfl: 0 .  COUMADIN 7.5 MG tablet,  TAKE 1 TABLET (7.5 MG TOTAL) BY MOUTH DAILY., Disp: 90 tablet, Rfl: 3 .  glucose 4 GM chewable tablet, Chew 16 g by mouth as needed for low blood sugar., Disp: , Rfl:  .  insulin glargine (LANTUS) 100 UNIT/ML injection, Inject 18 Units into the skin at bedtime. If CBG greater than 120, take 19 units., Disp: , Rfl:  .  NEOMYCIN-POLYMYXIN-HYDROCORTISONE (CORTISPORIN) 1 % SOLN OTIC solution, Place 3 drops into the left ear 4 (four) times daily., Disp: 10 mL, Rfl: 2 .  NON FORMULARY, Carbon-60 two tablespoons daily, Disp: , Rfl:  .  NON FORMULARY, Serrapeptase 3 capsules daily, Disp: , Rfl:  .  NON FORMULARY, Curcumin two capsules in AM, Disp: , Rfl:  .  OVER THE COUNTER MEDICATION, Take 1 tablet by mouth every morning. , Disp: , Rfl:  .  PREMARIN vaginal cream, Place 1 Applicatorful vaginally 2 (two) times a week. Monday and Wednesday or Thursday., Disp: 42.5 g, Rfl: 3 .  propranolol (INDERAL) 80 MG tablet, Take 80 mg by mouth every morning. , Disp: , Rfl:  .  ursodiol (ACTIGALL) 300 MG capsule, Take 300 mg by mouth 2 (two) times daily., Disp: , Rfl: 3 .  vitamin B-12 (CYANOCOBALAMIN) 1000 MCG tablet, Take 1,000 mcg by mouth daily. Take 2 tablets of 1000 mcg, a total  of 2000 mcg once a day, per patient., Disp: , Rfl:   Allergies:  Allergies  Allergen Reactions  . Caine-1 [Lidocaine Hcl] Anaphylaxis  . Lidocaine Hcl Anaphylaxis  . Morphine Anaphylaxis and Other (See Comments)  . Piperacillin Sod-Tazobactam So Itching    "scalp itch"-was given Benadryl to counteract. "scalp itch"-was given Benadryl to counteract. "scalp itch"-was given Benadryl to counteract.  . Codeine Nausea And Vomiting and Other (See Comments)  . Cyclosporine Other (See Comments)    burns burns burns  . Hydrocodone-Acetaminophen Nausea And Vomiting  . Hydromorphone Hcl Nausea And Vomiting    Can take with zofran  . Oxycodone-Acetaminophen Nausea And Vomiting  . Tramadol Nausea And Vomiting  . Dilaudid [Hydromorphone  Hcl] Nausea And Vomiting    Can take with zofran  . Morphine And Related Nausea And Vomiting  . Percocet [Oxycodone-Acetaminophen] Nausea And Vomiting  . Vicodin [Hydrocodone-Acetaminophen] Nausea And Vomiting    Past Medical History, Surgical history, Social history, and Family History were reviewed and updated.  Review of Systems: Review of Systems  Constitutional: Positive for malaise/fatigue.  HENT: Negative.   Eyes: Negative.   Respiratory: Negative.   Cardiovascular: Negative.   Gastrointestinal: Positive for abdominal pain and diarrhea.  Genitourinary: Negative.   Musculoskeletal: Positive for myalgias.  Skin: Negative.   Neurological: Negative.   Endo/Heme/Allergies: Negative.   Psychiatric/Behavioral: Negative.      Physical Exam:  weight is 152 lb (68.9 kg). Her oral temperature is 98.6 F (37 C). Her blood pressure is 138/77 and her pulse is 55 (abnormal). Her respiration is 16 and oxygen saturation is 96%.   Physical Exam  Constitutional: She is oriented to person, place, and time.  HENT:  Head: Normocephalic and atraumatic.  Mouth/Throat: Oropharynx is clear and moist.  Eyes: Pupils are equal, round, and reactive to light. EOM are normal.  Neck: Normal range of motion.  Cardiovascular: Normal rate, regular rhythm and normal heart sounds.  Pulmonary/Chest: Effort normal and breath sounds normal.  Abdominal: Soft. Bowel sounds are normal.    Musculoskeletal: Normal range of motion. She exhibits no edema, tenderness or deformity.  Lymphadenopathy:    She has no cervical adenopathy.  Neurological: She is alert and oriented to person, place, and time.  Skin: Skin is warm and dry. No rash noted. No erythema.  Psychiatric: She has a normal mood and affect. Her behavior is normal. Judgment and thought content normal.  Vitals reviewed.   Lab Results  Component Value Date   WBC 5.3 03/04/2018   HGB 15.1 (H) 03/04/2018   HCT 44.2 03/04/2018   MCV 98.2  03/04/2018   PLT 312 03/04/2018     Chemistry      Component Value Date/Time   NA 142 01/14/2018 0836   NA 144 04/04/2017 1139   NA 140 09/07/2016 0946   K 4.9 (H) 01/14/2018 0836   K 3.5 04/04/2017 1139   K 4.1 09/07/2016 0946   CL 109 (H) 01/14/2018 0836   CL 103 04/04/2017 1139   CO2 32 01/14/2018 0836   CO2 33 04/04/2017 1139   CO2 28 09/07/2016 0946   BUN 18 01/14/2018 0836   BUN 15 04/04/2017 1139   BUN 22.6 09/07/2016 0946   CREATININE 0.80 01/14/2018 0836   CREATININE 0.9 04/04/2017 1139   CREATININE 0.8 09/07/2016 0946      Component Value Date/Time   CALCIUM 9.8 01/14/2018 0836   CALCIUM 9.5 04/04/2017 1139   CALCIUM 9.6 09/07/2016 0946   ALKPHOS 86 (  H) 01/14/2018 0836   ALKPHOS 93 (H) 04/04/2017 1139   ALKPHOS 81 09/07/2016 0946   AST 30 01/14/2018 0836   AST 32 09/07/2016 0946   ALT 25 01/14/2018 0836   ALT 36 04/04/2017 1139   ALT 33 09/07/2016 0946   BILITOT 1.6 01/14/2018 0836   BILITOT 1.34 (H) 09/07/2016 0946       Impression and Plan: Laurie Green is 72 year old white female with the anti-phospholipid antibody syndrome. This really has not been a problem for her. The bigger issue has been the hepatic problems and her diabetes. She has steatosis. She has NASH.  Now that she is 72 years old, she thinks that she has to do more things to worry about.  I reassured her that from my point of view, everything is doing quite well with respect to blood clotting.  I do not think that she we will have another thromboembolic event.  We will check her INR monthly.  I will see her back in 1 month.   [name], MD 11/18/201912:16 PM

## 2018-03-05 ENCOUNTER — Telehealth: Payer: Self-pay | Admitting: Hematology & Oncology

## 2018-03-05 ENCOUNTER — Telehealth: Payer: Self-pay | Admitting: *Deleted

## 2018-03-05 NOTE — Telephone Encounter (Signed)
Spoke with patient to confirm lab only appt 03/11/18 at 11:45 am per sch msg

## 2018-03-05 NOTE — Telephone Encounter (Addendum)
Notes recorded by [name], MD on 03/04/2018 at 5:33 PM EST I called Mrs. Laurie Green. I told her that the INR was only 1.24. She needs to take 15 mg of Coumadin daily for the next 3 days and then drop back to 7.5 mg daily until she has her INR checked a week from today-November 25. I will put the appointment in for her.  [name]  ----- Message from [name], MD sent at 03/04/2018  2:03 PM EST ----- Call - INR is 1.24.  Is there any surgery planned??  [name]

## 2018-03-08 ENCOUNTER — Inpatient Hospital Stay: Payer: PPO

## 2018-03-08 DIAGNOSIS — D6861 Antiphospholipid syndrome: Secondary | ICD-10-CM | POA: Diagnosis not present

## 2018-03-08 DIAGNOSIS — I82401 Acute embolism and thrombosis of unspecified deep veins of right lower extremity: Secondary | ICD-10-CM

## 2018-03-08 LAB — PROTIME-INR
INR: 2.21
Prothrombin Time: 24.2 seconds — ABNORMAL HIGH (ref 11.4–15.2)

## 2018-03-11 ENCOUNTER — Other Ambulatory Visit: Payer: PPO

## 2018-03-17 ENCOUNTER — Other Ambulatory Visit: Payer: Self-pay | Admitting: Hematology & Oncology

## 2018-03-17 DIAGNOSIS — I82409 Acute embolism and thrombosis of unspecified deep veins of unspecified lower extremity: Secondary | ICD-10-CM

## 2018-03-17 DIAGNOSIS — D6861 Antiphospholipid syndrome: Secondary | ICD-10-CM

## 2018-03-21 DIAGNOSIS — R509 Fever, unspecified: Secondary | ICD-10-CM | POA: Diagnosis not present

## 2018-03-27 ENCOUNTER — Telehealth: Payer: Self-pay | Admitting: Hematology & Oncology

## 2018-03-27 NOTE — Telephone Encounter (Signed)
Spoke with patient regarding r/s appt from 12/17/ letter/calendar mailed  per 12/11 email provider off

## 2018-04-02 ENCOUNTER — Other Ambulatory Visit: Payer: PPO

## 2018-04-02 ENCOUNTER — Ambulatory Visit: Payer: PPO | Admitting: Hematology & Oncology

## 2018-04-03 DIAGNOSIS — J019 Acute sinusitis, unspecified: Secondary | ICD-10-CM | POA: Diagnosis not present

## 2018-04-26 ENCOUNTER — Ambulatory Visit: Payer: PPO | Admitting: Hematology & Oncology

## 2018-04-26 ENCOUNTER — Other Ambulatory Visit: Payer: PPO

## 2018-05-16 ENCOUNTER — Inpatient Hospital Stay (HOSPITAL_BASED_OUTPATIENT_CLINIC_OR_DEPARTMENT_OTHER): Payer: PPO | Admitting: Hematology & Oncology

## 2018-05-16 ENCOUNTER — Other Ambulatory Visit: Payer: Self-pay

## 2018-05-16 ENCOUNTER — Inpatient Hospital Stay: Payer: PPO | Attending: Hematology & Oncology

## 2018-05-16 ENCOUNTER — Encounter: Payer: Self-pay | Admitting: Hematology & Oncology

## 2018-05-16 VITALS — BP 146/68 | HR 57 | Temp 97.7°F | Resp 18 | Wt 148.8 lb

## 2018-05-16 DIAGNOSIS — D6861 Antiphospholipid syndrome: Secondary | ICD-10-CM

## 2018-05-16 DIAGNOSIS — Z86718 Personal history of other venous thrombosis and embolism: Secondary | ICD-10-CM | POA: Insufficient documentation

## 2018-05-16 DIAGNOSIS — Z794 Long term (current) use of insulin: Secondary | ICD-10-CM

## 2018-05-16 DIAGNOSIS — Z7901 Long term (current) use of anticoagulants: Secondary | ICD-10-CM

## 2018-05-16 DIAGNOSIS — Z79899 Other long term (current) drug therapy: Secondary | ICD-10-CM

## 2018-05-16 DIAGNOSIS — I82401 Acute embolism and thrombosis of unspecified deep veins of right lower extremity: Secondary | ICD-10-CM

## 2018-05-16 DIAGNOSIS — K7581 Nonalcoholic steatohepatitis (NASH): Secondary | ICD-10-CM | POA: Insufficient documentation

## 2018-05-16 DIAGNOSIS — K760 Fatty (change of) liver, not elsewhere classified: Secondary | ICD-10-CM

## 2018-05-16 LAB — CBC WITH DIFFERENTIAL (CANCER CENTER ONLY)
Abs Immature Granulocytes: 0.01 10*3/uL (ref 0.00–0.07)
Basophils Absolute: 0 10*3/uL (ref 0.0–0.1)
Basophils Relative: 1 %
Eosinophils Absolute: 0.4 10*3/uL (ref 0.0–0.5)
Eosinophils Relative: 6 %
HCT: 44.8 % (ref 36.0–46.0)
Hemoglobin: 15.2 g/dL — ABNORMAL HIGH (ref 12.0–15.0)
Immature Granulocytes: 0 %
Lymphocytes Relative: 32 %
Lymphs Abs: 1.9 10*3/uL (ref 0.7–4.0)
MCH: 33.8 pg (ref 26.0–34.0)
MCHC: 33.9 g/dL (ref 30.0–36.0)
MCV: 99.6 fL (ref 80.0–100.0)
Monocytes Absolute: 0.6 10*3/uL (ref 0.1–1.0)
Monocytes Relative: 10 %
Neutro Abs: 3 10*3/uL (ref 1.7–7.7)
Neutrophils Relative %: 51 %
Platelet Count: 325 10*3/uL (ref 150–400)
RBC: 4.5 MIL/uL (ref 3.87–5.11)
RDW: 13.1 % (ref 11.5–15.5)
WBC Count: 5.9 10*3/uL (ref 4.0–10.5)
nRBC: 0 % (ref 0.0–0.2)

## 2018-05-16 LAB — CMP (CANCER CENTER ONLY)
ALT: 22 U/L (ref 0–44)
AST: 23 U/L (ref 15–41)
Albumin: 4.2 g/dL (ref 3.5–5.0)
Alkaline Phosphatase: 86 U/L (ref 38–126)
Anion gap: 7 (ref 5–15)
BUN: 22 mg/dL (ref 8–23)
CO2: 34 mmol/L — ABNORMAL HIGH (ref 22–32)
Calcium: 10.1 mg/dL (ref 8.9–10.3)
Chloride: 104 mmol/L (ref 98–111)
Creatinine: 0.81 mg/dL (ref 0.44–1.00)
GFR, Est AFR Am: >60 mL/min (ref >60)
GFR, Estimated: >60 mL/min (ref >60)
Glucose, Bld: 177 mg/dL — ABNORMAL HIGH (ref 70–99)
Potassium: 4.9 mmol/L (ref 3.5–5.1)
Sodium: 145 mmol/L (ref 135–145)
Total Bilirubin: 1 mg/dL (ref 0.3–1.2)
Total Protein: 6.9 g/dL (ref 6.5–8.1)

## 2018-05-16 LAB — PROTIME-INR
INR: 1.33
Prothrombin Time: 16.3 seconds — ABNORMAL HIGH (ref 11.4–15.2)

## 2018-05-16 NOTE — Progress Notes (Signed)
Hematology and Oncology Follow Up Visit  Laurie Green [id number] [birth year] 73 y.o. 05/16/2018   Principle Diagnosis:  1. Antiphospholipid antibody syndrome. 2. History of recurrent lower extremity deep venous thrombosis. 3. Nonalcoholic steatohepatitis.  Current Therapy:   Coumadin - 7.5 mg po q day -  to maintain an INR between 2-3.     Interim History:  Ms.  Green is back for followup.  She is having a tough time.  We last saw her back in November.  Unfortunately, she still is trying to deal with the dental issues.  She had teeth removed.  I am unsure when the implants are going to be put in.  She is having quite a bit of pain.  She cannot take narcotics.  She also had a viral infection right around Thanksgiving.  This lasted for about 5 weeks.  She has not had any issues with her Coumadin.  She is had no bleeding.  Her INR is 1.33.  I told her to take 10 mg daily for 4 days and then we will recheck her INR next Tuesday.    She will need another MRI so we can check out her pancreas.  Her last MRI was back in June.  We will see about getting an MRI set up in 2 weeks.  Overall, her performance status is ECOG 1.  Medications:  Current Outpatient Medications:  .  benazepril (LOTENSIN) 20 MG tablet, Take 20 mg by mouth every morning. , Disp: , Rfl:  .  Biotin 10000 MCG TABS, Take 10,000 mcg by mouth daily., Disp: , Rfl:  .  Cholecalciferol (VITAMIN D3) 2000 UNITS capsule, Take 2,000 Units by mouth daily., Disp: , Rfl:  .  Continuous Blood Gluc Receiver (FREESTYLE LIBRE 34 DAY READER) DEVI, See admin instructions., Disp: , Rfl: 0 .  Continuous Blood Gluc Sensor (FREESTYLE LIBRE 14 DAY SENSOR) MISC, See admin instructions., Disp: , Rfl: 5 .  COUMADIN 7.5 MG tablet, TAKE 1 TABLET (7.5 MG TOTAL) BY MOUTH DAILY., Disp: 90 tablet, Rfl: 3 .  glucose 4 GM chewable tablet, Chew 16 g by mouth as needed for low blood sugar., Disp: , Rfl:  .  insulin glargine (LANTUS) 100 UNIT/ML  injection, Inject 18 Units into the skin at bedtime. If CBG greater than 120, take 19 units., Disp: , Rfl:  .  NEOMYCIN-POLYMYXIN-HYDROCORTISONE (CORTISPORIN) 1 % SOLN OTIC solution, Place 3 drops into the left ear 4 (four) times daily., Disp: 10 mL, Rfl: 2 .  NON FORMULARY, Carbon-60 two tablespoons daily, Disp: , Rfl:  .  NON FORMULARY, Serrapeptase 3 capsules daily, Disp: , Rfl:  .  NON FORMULARY, Curcumin two capsules in AM, Disp: , Rfl:  .  PREMARIN vaginal cream, Place 1 Applicatorful vaginally 2 (two) times a week. Monday and Wednesday or Thursday., Disp: 42.5 g, Rfl: 3 .  propranolol (INDERAL) 80 MG tablet, Take 80 mg by mouth every morning. , Disp: , Rfl:  .  vitamin B-12 (CYANOCOBALAMIN) 1000 MCG tablet, Take 1,000 mcg by mouth daily. Take 2 tablets of 1000 mcg, a total of 2000 mcg once a day, per patient., Disp: , Rfl:   Allergies:  Allergies  Allergen Reactions  . Caine-1 [Lidocaine Hcl] Anaphylaxis  . Lidocaine Hcl Anaphylaxis  . Morphine Anaphylaxis and Other (See Comments)  . Piperacillin Sod-Tazobactam So Itching    "scalp itch"-was given Benadryl to counteract. "scalp itch"-was given Benadryl to counteract. "scalp itch"-was given Benadryl to counteract.  . Codeine Nausea And Vomiting and  Other (See Comments)  . Cyclosporine Other (See Comments)    burns burns burns  . Hydrocodone-Acetaminophen Nausea And Vomiting  . Hydromorphone Hcl Nausea And Vomiting    Can take with zofran  . Oxycodone-Acetaminophen Nausea And Vomiting  . Tramadol Nausea And Vomiting  . Dilaudid [Hydromorphone Hcl] Nausea And Vomiting    Can take with zofran  . Morphine And Related Nausea And Vomiting  . Percocet [Oxycodone-Acetaminophen] Nausea And Vomiting  . Vicodin [Hydrocodone-Acetaminophen] Nausea And Vomiting    Past Medical History, Surgical history, Social history, and Family History were reviewed and updated.  Review of Systems: Review of Systems  Constitutional: Positive for  malaise/fatigue.  HENT: Negative.   Eyes: Negative.   Respiratory: Negative.   Cardiovascular: Negative.   Gastrointestinal: Positive for abdominal pain and diarrhea.  Genitourinary: Negative.   Musculoskeletal: Positive for myalgias.  Skin: Negative.   Neurological: Negative.   Endo/Heme/Allergies: Negative.   Psychiatric/Behavioral: Negative.      Physical Exam:  weight is 148 lb 12 oz (67.5 kg). Her oral temperature is 97.7 F (36.5 C). Her blood pressure is 146/68 (abnormal) and her pulse is 57 (abnormal). Her respiration is 18 and oxygen saturation is 98%.   Physical Exam Vitals signs reviewed.  HENT:     Head: Normocephalic and atraumatic.  Eyes:     Pupils: Pupils are equal, round, and reactive to light.  Neck:     Musculoskeletal: Normal range of motion.  Cardiovascular:     Rate and Rhythm: Normal rate and regular rhythm.     Heart sounds: Normal heart sounds.  Pulmonary:     Effort: Pulmonary effort is normal.     Breath sounds: Normal breath sounds.  Abdominal:     General: Bowel sounds are normal.     Palpations: Abdomen is soft.    Musculoskeletal: Normal range of motion.        General: No tenderness or deformity.  Lymphadenopathy:     Cervical: No cervical adenopathy.  Skin:    General: Skin is warm and dry.     Findings: No erythema or rash.  Neurological:     Mental Status: She is alert and oriented to person, place, and time.  Psychiatric:        Behavior: Behavior normal.        Thought Content: Thought content normal.        Judgment: Judgment normal.     Lab Results  Component Value Date   WBC 5.9 05/16/2018   HGB 15.2 (H) 05/16/2018   HCT 44.8 05/16/2018   MCV 99.6 05/16/2018   PLT 325 05/16/2018     Chemistry      Component Value Date/Time   NA 145 05/16/2018 1031   NA 144 04/04/2017 1139   NA 140 09/07/2016 0946   K 4.9 05/16/2018 1031   K 3.5 04/04/2017 1139   K 4.1 09/07/2016 0946   CL 104 05/16/2018 1031   CL 103  04/04/2017 1139   CO2 34 (H) 05/16/2018 1031   CO2 33 04/04/2017 1139   CO2 28 09/07/2016 0946   BUN 22 05/16/2018 1031   BUN 15 04/04/2017 1139   BUN 22.6 09/07/2016 0946   CREATININE 0.81 05/16/2018 1031   CREATININE 0.9 04/04/2017 1139   CREATININE 0.8 09/07/2016 0946      Component Value Date/Time   CALCIUM 10.1 05/16/2018 1031   CALCIUM 9.5 04/04/2017 1139   CALCIUM 9.6 09/07/2016 0946   ALKPHOS 86 05/16/2018 1031  ALKPHOS 93 (H) 04/04/2017 1139   ALKPHOS 81 09/07/2016 0946   AST 23 05/16/2018 1031   AST 32 09/07/2016 0946   ALT 22 05/16/2018 1031   ALT 36 04/04/2017 1139   ALT 33 09/07/2016 0946   BILITOT 1.0 05/16/2018 1031   BILITOT 1.34 (H) 09/07/2016 0946       Impression and Plan: Ms. Godsil is 73 year old white female with the anti-phospholipid antibody syndrome. This really has not been a problem for her. The bigger issue has been the hepatic problems and her diabetes. She has steatosis. She has NASH.  Her blood sugars actually are doing pretty well considering that she was on steroids.  We will have to see what her MRI shows.  I will have her come back in about 5-6 weeks.  She will come back in 1 week to have her INR rechecked.  [name], MD 1/30/202011:58 AM

## 2018-05-17 ENCOUNTER — Other Ambulatory Visit: Payer: Self-pay | Admitting: Family

## 2018-05-17 DIAGNOSIS — K760 Fatty (change of) liver, not elsewhere classified: Secondary | ICD-10-CM

## 2018-05-21 ENCOUNTER — Inpatient Hospital Stay: Payer: PPO | Attending: Hematology & Oncology

## 2018-05-21 DIAGNOSIS — Z7901 Long term (current) use of anticoagulants: Secondary | ICD-10-CM | POA: Insufficient documentation

## 2018-05-21 DIAGNOSIS — Z79899 Other long term (current) drug therapy: Secondary | ICD-10-CM | POA: Diagnosis not present

## 2018-05-21 DIAGNOSIS — I82401 Acute embolism and thrombosis of unspecified deep veins of right lower extremity: Secondary | ICD-10-CM | POA: Diagnosis not present

## 2018-05-21 DIAGNOSIS — D6861 Antiphospholipid syndrome: Secondary | ICD-10-CM | POA: Insufficient documentation

## 2018-05-21 LAB — PROTIME-INR
INR: 1.94
Prothrombin Time: 21.9 seconds — ABNORMAL HIGH (ref 11.4–15.2)

## 2018-05-22 ENCOUNTER — Telehealth: Payer: Self-pay

## 2018-05-22 NOTE — Telephone Encounter (Addendum)
Attached message given to pt via phone. Pt verbalized understanding and new Coumadin instructions. dph  ----- Message from [name], MD sent at 05/22/2018  6:23 AM EST ----- Call - INR is 1.94.  Go back to 7.5 mg dose of coumadin.  pete

## 2018-05-28 ENCOUNTER — Other Ambulatory Visit: Payer: Self-pay | Admitting: Family

## 2018-05-28 ENCOUNTER — Telehealth: Payer: Self-pay | Admitting: *Deleted

## 2018-05-28 DIAGNOSIS — M79605 Pain in left leg: Secondary | ICD-10-CM

## 2018-05-28 NOTE — Telephone Encounter (Signed)
Patient calling c/o pain to her Left foot and calf. She also claims there is some warmth to site. She has a history of DVT and wants to know what she should do.   Spoke with Laverna Peace NP and notified her of patient complaints. Order for a doppler placed. Patient notified to call [phone] to schedule a doppler. Will follow up with results.

## 2018-05-31 DIAGNOSIS — M79672 Pain in left foot: Secondary | ICD-10-CM | POA: Diagnosis not present

## 2018-06-03 ENCOUNTER — Telehealth: Payer: Self-pay | Admitting: *Deleted

## 2018-06-03 ENCOUNTER — Ambulatory Visit (HOSPITAL_BASED_OUTPATIENT_CLINIC_OR_DEPARTMENT_OTHER)
Admission: RE | Admit: 2018-06-03 | Discharge: 2018-06-03 | Disposition: A | Discharge status: Home / Self Care | Payer: PPO | Source: Ambulatory Visit | Attending: Family | Admitting: Family

## 2018-06-03 DIAGNOSIS — M79605 Pain in left leg: Secondary | ICD-10-CM

## 2018-06-03 DIAGNOSIS — M79662 Pain in left lower leg: Secondary | ICD-10-CM | POA: Diagnosis not present

## 2018-06-03 NOTE — Telephone Encounter (Signed)
-----   Message from Eliezer Bottom, NP sent at 06/03/2018  2:50 PM EST ----- No DVT :)  ----- Message ----- From: Interface, Rad Results In Sent: 06/03/2018   2:19 PM EST To: Eliezer Bottom, NP

## 2018-06-03 NOTE — Telephone Encounter (Signed)
As noted below by Laverna Peace, NP, I informed the patient that she does not have a DVT. She verbalized understanding.

## 2018-06-05 ENCOUNTER — Ambulatory Visit
Admission: RE | Admit: 2018-06-05 | Discharge: 2018-06-05 | Disposition: A | Discharge status: Home / Self Care | Payer: PPO | Source: Ambulatory Visit | Attending: Family | Admitting: Family

## 2018-06-05 DIAGNOSIS — K862 Cyst of pancreas: Secondary | ICD-10-CM | POA: Diagnosis not present

## 2018-06-05 DIAGNOSIS — K760 Fatty (change of) liver, not elsewhere classified: Secondary | ICD-10-CM

## 2018-06-05 MED ORDER — GADOBENATE DIMEGLUMINE 529 MG/ML IV SOLN
14.0000 mL | Freq: Once | INTRAVENOUS | Status: AC | PRN
  Administered 2018-06-05: 14 mL via INTRAVENOUS

## 2018-06-07 ENCOUNTER — Telehealth: Payer: Self-pay | Admitting: *Deleted

## 2018-06-07 NOTE — Telephone Encounter (Signed)
Patient notified per order of Dr. Marin Olp that the lesion in the pancreas is smaller. Patient appreciative of call and has no questions or concerns at this time.

## 2018-06-07 NOTE — Telephone Encounter (Signed)
-----   Message from [name], MD sent at 06/07/2018 10:15 AM EST ----- Call - tell hjer that the lesion in the pancreas is smaller!!!  pete

## 2018-06-26 ENCOUNTER — Inpatient Hospital Stay: Payer: PPO | Attending: Hematology & Oncology | Admitting: Hematology & Oncology

## 2018-06-26 ENCOUNTER — Encounter: Payer: Self-pay | Admitting: Hematology & Oncology

## 2018-06-26 ENCOUNTER — Inpatient Hospital Stay: Payer: PPO

## 2018-06-26 ENCOUNTER — Other Ambulatory Visit: Payer: Self-pay

## 2018-06-26 VITALS — BP 151/61 | HR 57 | Temp 98.6°F | Resp 20 | Wt 150.0 lb

## 2018-06-26 DIAGNOSIS — Z794 Long term (current) use of insulin: Secondary | ICD-10-CM | POA: Diagnosis not present

## 2018-06-26 DIAGNOSIS — Z86718 Personal history of other venous thrombosis and embolism: Secondary | ICD-10-CM | POA: Diagnosis not present

## 2018-06-26 DIAGNOSIS — K760 Fatty (change of) liver, not elsewhere classified: Secondary | ICD-10-CM

## 2018-06-26 DIAGNOSIS — K7581 Nonalcoholic steatohepatitis (NASH): Secondary | ICD-10-CM

## 2018-06-26 DIAGNOSIS — E119 Type 2 diabetes mellitus without complications: Secondary | ICD-10-CM

## 2018-06-26 DIAGNOSIS — D6861 Antiphospholipid syndrome: Secondary | ICD-10-CM

## 2018-06-26 DIAGNOSIS — Z7901 Long term (current) use of anticoagulants: Secondary | ICD-10-CM | POA: Diagnosis not present

## 2018-06-26 DIAGNOSIS — Z79899 Other long term (current) drug therapy: Secondary | ICD-10-CM

## 2018-06-26 DIAGNOSIS — I82401 Acute embolism and thrombosis of unspecified deep veins of right lower extremity: Secondary | ICD-10-CM

## 2018-06-26 LAB — CMP (CANCER CENTER ONLY)
ALT: 21 U/L (ref 0–44)
AST: 25 U/L (ref 15–41)
Albumin: 4.3 g/dL (ref 3.5–5.0)
Alkaline Phosphatase: 81 U/L (ref 38–126)
Anion gap: 7 (ref 5–15)
BUN: 20 mg/dL (ref 8–23)
CO2: 33 mmol/L — ABNORMAL HIGH (ref 22–32)
Calcium: 9.9 mg/dL (ref 8.9–10.3)
Chloride: 103 mmol/L (ref 98–111)
Creatinine: 0.76 mg/dL (ref 0.44–1.00)
GFR, Est AFR Am: >60 mL/min (ref >60)
GFR, Estimated: >60 mL/min (ref >60)
Glucose, Bld: 178 mg/dL — ABNORMAL HIGH (ref 70–99)
Potassium: 4.3 mmol/L (ref 3.5–5.1)
Sodium: 143 mmol/L (ref 135–145)
Total Bilirubin: 1.3 mg/dL — ABNORMAL HIGH (ref 0.3–1.2)
Total Protein: 7.1 g/dL (ref 6.5–8.1)

## 2018-06-26 LAB — PROTIME-INR
INR: 2.1 — ABNORMAL HIGH (ref 0.8–1.2)
Prothrombin Time: 23.6 seconds — ABNORMAL HIGH (ref 11.4–15.2)

## 2018-06-26 LAB — CBC WITH DIFFERENTIAL (CANCER CENTER ONLY)
Abs Immature Granulocytes: 0.01 10*3/uL (ref 0.00–0.07)
Basophils Absolute: 0.1 10*3/uL (ref 0.0–0.1)
Basophils Relative: 1 %
Eosinophils Absolute: 0.2 10*3/uL (ref 0.0–0.5)
Eosinophils Relative: 3 %
HCT: 43.9 % (ref 36.0–46.0)
Hemoglobin: 14.9 g/dL (ref 12.0–15.0)
Immature Granulocytes: 0 %
Lymphocytes Relative: 33 %
Lymphs Abs: 1.8 10*3/uL (ref 0.7–4.0)
MCH: 33.6 pg (ref 26.0–34.0)
MCHC: 33.9 g/dL (ref 30.0–36.0)
MCV: 99.1 fL (ref 80.0–100.0)
Monocytes Absolute: 0.6 10*3/uL (ref 0.1–1.0)
Monocytes Relative: 12 %
Neutro Abs: 2.7 10*3/uL (ref 1.7–7.7)
Neutrophils Relative %: 51 %
Platelet Count: 298 10*3/uL (ref 150–400)
RBC: 4.43 MIL/uL (ref 3.87–5.11)
RDW: 12.9 % (ref 11.5–15.5)
WBC Count: 5.3 10*3/uL (ref 4.0–10.5)
nRBC: 0 % (ref 0.0–0.2)

## 2018-06-26 NOTE — Progress Notes (Signed)
Hematology and Oncology Follow Up Visit  ELASHA TESS [id number] Jun 09, 1945 73 y.o. 06/26/2018   Principle Diagnosis:  1. Antiphospholipid antibody syndrome. 2. History of recurrent lower extremity deep venous thrombosis. 3. Nonalcoholic steatohepatitis.  Current Therapy:   Coumadin - 7.5 mg po q day -  to maintain an INR between 2-3.     Interim History:  Ms.  Helvey is back for followup.  She is pretty emotional today.  Her dog has had its cancer come back.  He is declining.  He may have another month.  He is 71 years old.  This has really been tough for Ms. Lonia Skinner.  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  In fact, it decreased in size a little bit.  It now measures 1.6 x 1.2 x 1.5 cm.  Her INR today is 2.1.  This is perfect.  She is had no cough.  She is had no bleeding.  There is been no change in bowel or bladder habits.  She has had no leg swelling.  She has had no rashes.  Overall, her performance status is ECOG 1.  Medications:  Current Outpatient Medications:  .  benazepril (LOTENSIN) 20 MG tablet, Take 20 mg by mouth every morning. , Disp: , Rfl:  .  Biotin 10000 MCG TABS, Take 10,000 mcg by mouth daily., Disp: , Rfl:  .  Cholecalciferol (VITAMIN D3) 2000 UNITS capsule, Take 2,000 Units by mouth daily., Disp: , Rfl:  .  Continuous Blood Gluc Receiver (FREESTYLE LIBRE 82 DAY READER) DEVI, See admin instructions., Disp: , Rfl: 0 .  Continuous Blood Gluc Sensor (FREESTYLE LIBRE 14 DAY SENSOR) MISC, See admin instructions., Disp: , Rfl: 5 .  COUMADIN 7.5 MG tablet, TAKE 1 TABLET (7.5 MG TOTAL) BY MOUTH DAILY., Disp: 90 tablet, Rfl: 3 .  glucose 4 GM chewable tablet, Chew 16 g by mouth as needed for low blood sugar., Disp: , Rfl:  .  insulin glargine (LANTUS) 100 UNIT/ML injection, Inject 18 Units into the skin at bedtime. If CBG greater than 120, take 19 units., Disp: , Rfl:  .  NEOMYCIN-POLYMYXIN-HYDROCORTISONE (CORTISPORIN) 1 %  SOLN OTIC solution, Place 3 drops into the left ear 4 (four) times daily., Disp: 10 mL, Rfl: 2 .  NON FORMULARY, Carbon-60 two tablespoons daily, Disp: , Rfl:  .  NON FORMULARY, Serrapeptase 3 capsules daily, Disp: , Rfl:  .  PREMARIN vaginal cream, Place 1 Applicatorful vaginally 2 (two) times a week. Monday and Wednesday or Thursday., Disp: 42.5 g, Rfl: 3 .  propranolol (INDERAL) 80 MG tablet, Take 80 mg by mouth every morning. , Disp: , Rfl:  .  vitamin B-12 (CYANOCOBALAMIN) 1000 MCG tablet, Take 1,000 mcg by mouth daily. Take 2 tablets of 1000 mcg, a total of 2000 mcg once a day, per patient., Disp: , Rfl:  .  NON FORMULARY, Curcumin two capsules in AM, Disp: , Rfl:   Allergies:  Allergies  Allergen Reactions  . Caine-1 [Lidocaine Hcl] Anaphylaxis  . Lidocaine Hcl Anaphylaxis  . Morphine Anaphylaxis and Other (See Comments)  . Piperacillin Sod-Tazobactam So Itching    "scalp itch"-was given Benadryl to counteract. "scalp itch"-was given Benadryl to counteract. "scalp itch"-was given Benadryl to counteract.  . Codeine Nausea And Vomiting and Other (See Comments)  . Cyclosporine Other (See Comments)    burns burns burns  . Hydrocodone-Acetaminophen Nausea And Vomiting  . Hydromorphone Hcl Nausea And Vomiting    Can take  with zofran  . Oxycodone-Acetaminophen Nausea And Vomiting  . Tramadol Nausea And Vomiting  . Dilaudid [Hydromorphone Hcl] Nausea And Vomiting    Can take with zofran  . Morphine And Related Nausea And Vomiting  . Percocet [Oxycodone-Acetaminophen] Nausea And Vomiting  . Vicodin [Hydrocodone-Acetaminophen] Nausea And Vomiting    Past Medical History, Surgical history, Social history, and Family History were reviewed and updated.  Review of Systems: Review of Systems  Constitutional: Positive for malaise/fatigue.  HENT: Negative.   Eyes: Negative.   Respiratory: Negative.   Cardiovascular: Negative.   Gastrointestinal: Positive for abdominal pain and  diarrhea.  Genitourinary: Negative.   Musculoskeletal: Positive for myalgias.  Skin: Negative.   Neurological: Negative.   Endo/Heme/Allergies: Negative.   Psychiatric/Behavioral: Negative.      Physical Exam:  weight is 150 lb (68 kg). Her oral temperature is 98.6 F (37 C). Her blood pressure is 151/61 (abnormal) and her pulse is 57 (abnormal). Her respiration is 20 and oxygen saturation is 96%.   Physical Exam Vitals signs reviewed.  HENT:     Head: Normocephalic and atraumatic.  Eyes:     Pupils: Pupils are equal, round, and reactive to light.  Neck:     Musculoskeletal: Normal range of motion.  Cardiovascular:     Rate and Rhythm: Normal rate and regular rhythm.     Heart sounds: Normal heart sounds.  Pulmonary:     Effort: Pulmonary effort is normal.     Breath sounds: Normal breath sounds.  Abdominal:     General: Bowel sounds are normal.     Palpations: Abdomen is soft.    Musculoskeletal: Normal range of motion.        General: No tenderness or deformity.  Lymphadenopathy:     Cervical: No cervical adenopathy.  Skin:    General: Skin is warm and dry.     Findings: No erythema or rash.  Neurological:     Mental Status: She is alert and oriented to person, place, and time.  Psychiatric:        Behavior: Behavior normal.        Thought Content: Thought content normal.        Judgment: Judgment normal.     Lab Results  Component Value Date   WBC 5.3 06/26/2018   HGB 14.9 06/26/2018   HCT 43.9 06/26/2018   MCV 99.1 06/26/2018   PLT 298 06/26/2018     Chemistry      Component Value Date/Time   NA 143 06/26/2018 1149   NA 144 04/04/2017 1139   NA 140 09/07/2016 0946   K 4.3 06/26/2018 1149   K 3.5 04/04/2017 1139   K 4.1 09/07/2016 0946   CL 103 06/26/2018 1149   CL 103 04/04/2017 1139   CO2 33 (H) 06/26/2018 1149   CO2 33 04/04/2017 1139   CO2 28 09/07/2016 0946   BUN 20 06/26/2018 1149   BUN 15 04/04/2017 1139   BUN 22.6 09/07/2016 0946    CREATININE 0.76 06/26/2018 1149   CREATININE 0.9 04/04/2017 1139   CREATININE 0.8 09/07/2016 0946      Component Value Date/Time   CALCIUM 9.9 06/26/2018 1149   CALCIUM 9.5 04/04/2017 1139   CALCIUM 9.6 09/07/2016 0946   ALKPHOS 81 06/26/2018 1149   ALKPHOS 93 (H) 04/04/2017 1139   ALKPHOS 81 09/07/2016 0946   AST 25 06/26/2018 1149   AST 32 09/07/2016 0946   ALT 21 06/26/2018 1149   ALT 36 04/04/2017 1139  ALT 33 09/07/2016 0946   BILITOT 1.3 (H) 06/26/2018 1149   BILITOT 1.34 (H) 09/07/2016 0946       Impression and Plan: Ms. Babin is 73 year old white female with the anti-phospholipid antibody syndrome. This really has not been a problem for her. The bigger issue has been the hepatic problems and her diabetes. She has steatosis. She has NASH.  We can now get her back in about 6 weeks.  I do think we have to check her INR before we see her back again.  I just feel bad about her poor dog.  I am going through the same thing right now with my own dog.  As such, we consoled each other.    [name], MD 3/11/20201:26 PM

## 2018-07-01 DIAGNOSIS — I10 Essential (primary) hypertension: Secondary | ICD-10-CM | POA: Diagnosis not present

## 2018-07-01 DIAGNOSIS — E559 Vitamin D deficiency, unspecified: Secondary | ICD-10-CM | POA: Diagnosis not present

## 2018-07-01 DIAGNOSIS — E782 Mixed hyperlipidemia: Secondary | ICD-10-CM | POA: Diagnosis not present

## 2018-07-01 DIAGNOSIS — E119 Type 2 diabetes mellitus without complications: Secondary | ICD-10-CM | POA: Diagnosis not present

## 2018-07-03 DIAGNOSIS — E785 Hyperlipidemia, unspecified: Secondary | ICD-10-CM | POA: Diagnosis not present

## 2018-07-03 DIAGNOSIS — Z0000 Encounter for general adult medical examination without abnormal findings: Secondary | ICD-10-CM | POA: Diagnosis not present

## 2018-07-03 DIAGNOSIS — E119 Type 2 diabetes mellitus without complications: Secondary | ICD-10-CM | POA: Diagnosis not present

## 2018-07-03 DIAGNOSIS — I10 Essential (primary) hypertension: Secondary | ICD-10-CM | POA: Diagnosis not present

## 2018-08-07 ENCOUNTER — Other Ambulatory Visit: Payer: PPO

## 2018-08-07 ENCOUNTER — Ambulatory Visit: Payer: PPO | Admitting: Hematology & Oncology

## 2018-08-19 ENCOUNTER — Other Ambulatory Visit: Payer: PPO

## 2018-08-19 ENCOUNTER — Ambulatory Visit: Payer: PPO | Admitting: Hematology & Oncology

## 2018-08-23 ENCOUNTER — Other Ambulatory Visit: Payer: PPO

## 2018-08-23 ENCOUNTER — Ambulatory Visit: Payer: PPO | Admitting: Hematology & Oncology

## 2018-08-26 ENCOUNTER — Other Ambulatory Visit: Payer: Self-pay

## 2018-08-26 ENCOUNTER — Inpatient Hospital Stay (HOSPITAL_BASED_OUTPATIENT_CLINIC_OR_DEPARTMENT_OTHER): Payer: PPO | Admitting: Family

## 2018-08-26 ENCOUNTER — Inpatient Hospital Stay: Payer: PPO | Attending: Hematology & Oncology

## 2018-08-26 VITALS — BP 154/64 | HR 57 | Resp 19 | Ht 64.0 in | Wt 152.0 lb

## 2018-08-26 DIAGNOSIS — I82401 Acute embolism and thrombosis of unspecified deep veins of right lower extremity: Secondary | ICD-10-CM

## 2018-08-26 DIAGNOSIS — K7581 Nonalcoholic steatohepatitis (NASH): Secondary | ICD-10-CM | POA: Insufficient documentation

## 2018-08-26 DIAGNOSIS — Z79899 Other long term (current) drug therapy: Secondary | ICD-10-CM | POA: Diagnosis not present

## 2018-08-26 DIAGNOSIS — I82402 Acute embolism and thrombosis of unspecified deep veins of left lower extremity: Secondary | ICD-10-CM | POA: Diagnosis not present

## 2018-08-26 DIAGNOSIS — D6861 Antiphospholipid syndrome: Secondary | ICD-10-CM | POA: Diagnosis not present

## 2018-08-26 DIAGNOSIS — R197 Diarrhea, unspecified: Secondary | ICD-10-CM | POA: Diagnosis not present

## 2018-08-26 DIAGNOSIS — Z7901 Long term (current) use of anticoagulants: Secondary | ICD-10-CM | POA: Insufficient documentation

## 2018-08-26 LAB — CBC WITH DIFFERENTIAL (CANCER CENTER ONLY)
Abs Immature Granulocytes: 0.01 10*3/uL (ref 0.00–0.07)
Basophils Absolute: 0.1 10*3/uL (ref 0.0–0.1)
Basophils Relative: 1 %
Eosinophils Absolute: 0.3 10*3/uL (ref 0.0–0.5)
Eosinophils Relative: 4 %
HCT: 43.7 % (ref 36.0–46.0)
Hemoglobin: 15 g/dL (ref 12.0–15.0)
Immature Granulocytes: 0 %
Lymphocytes Relative: 32 %
Lymphs Abs: 1.8 10*3/uL (ref 0.7–4.0)
MCH: 33.7 pg (ref 26.0–34.0)
MCHC: 34.3 g/dL (ref 30.0–36.0)
MCV: 98.2 fL (ref 80.0–100.0)
Monocytes Absolute: 0.7 10*3/uL (ref 0.1–1.0)
Monocytes Relative: 13 %
Neutro Abs: 2.8 10*3/uL (ref 1.7–7.7)
Neutrophils Relative %: 50 %
Platelet Count: 286 10*3/uL (ref 150–400)
RBC: 4.45 MIL/uL (ref 3.87–5.11)
RDW: 12.7 % (ref 11.5–15.5)
WBC Count: 5.6 10*3/uL (ref 4.0–10.5)
nRBC: 0 % (ref 0.0–0.2)

## 2018-08-26 LAB — CMP (CANCER CENTER ONLY)
ALT: 23 U/L (ref 0–44)
AST: 26 U/L (ref 15–41)
Albumin: 4.1 g/dL (ref 3.5–5.0)
Alkaline Phosphatase: 82 U/L (ref 38–126)
Anion gap: 7 (ref 5–15)
BUN: 16 mg/dL (ref 8–23)
CO2: 32 mmol/L (ref 22–32)
Calcium: 9.9 mg/dL (ref 8.9–10.3)
Chloride: 104 mmol/L (ref 98–111)
Creatinine: 0.8 mg/dL (ref 0.44–1.00)
GFR, Est AFR Am: >60 mL/min (ref >60)
GFR, Estimated: >60 mL/min (ref >60)
Glucose, Bld: 149 mg/dL — ABNORMAL HIGH (ref 70–99)
Potassium: 4.4 mmol/L (ref 3.5–5.1)
Sodium: 143 mmol/L (ref 135–145)
Total Bilirubin: 1.3 mg/dL — ABNORMAL HIGH (ref 0.3–1.2)
Total Protein: 6.8 g/dL (ref 6.5–8.1)

## 2018-08-26 LAB — PROTIME-INR
INR: 2.7 — ABNORMAL HIGH (ref 0.8–1.2)
Prothrombin Time: 28.3 seconds — ABNORMAL HIGH (ref 11.4–15.2)

## 2018-08-26 NOTE — Progress Notes (Signed)
Hematology and Oncology Follow Up Visit  Laurie Green [id number] [birth year] 73 y.o. 08/26/2018   Principle Diagnosis:  Antiphospholipid antibody syndrome. History of recurrent lower extremity deep venous thrombosis. Nonalcoholic steatohepatitis.  Current Therapy:   Coumadin - 7.5 mg po q day - to maintain an INR between 2-3   Interim History:  Laurie Green is here today for follow-up. She has had a hard few weeks. Unfortunately, her sweet dog Sep 21, 2022 passed away in 2022/07/22 from cancer. She has felt depressed over this and misses him terribly. He other dog has been sad as well. She is hoping to find another dog in the next few weeks to help heal their broken hearts.  She has not had any issues with bleeding. No bruising or petechiae.  INR is pending. She is taking her Coumadin 7.5 mg PO daily at bedtime.  No issue with infection. No fever, chills, n/v, cough, rash, dizziness, SOB, chest pain, palpitations or changes in bowel or bladder habits.  She has occasional left sided discomfort where she had hernia surgery.  She has diarrhea off and on which is her baseline.  No swelling, tenderness, numbness or tingling in her extremities. No lymphadenopathy noted on exam.  She has maintained a good appetite and is staying hydrated. Her weight is stable.   ECOG Performance Status: 1 - Symptomatic but completely ambulatory  Medications:  Allergies as of 08/26/2018      Reactions   Caine-1 [lidocaine Hcl] Anaphylaxis   Lidocaine Hcl Anaphylaxis   Morphine Anaphylaxis, Other (See Comments)   Piperacillin Sod-tazobactam So Itching   "scalp itch"-was given Benadryl to counteract. "scalp itch"-was given Benadryl to counteract. "scalp itch"-was given Benadryl to counteract.   Codeine Nausea And Vomiting, Other (See Comments)   Cyclosporine Other (See Comments)   burns burns burns   Hydrocodone-acetaminophen Nausea And Vomiting   Hydromorphone Hcl Nausea And Vomiting   Can take with zofran   Oxycodone-acetaminophen Nausea And Vomiting   Tramadol Nausea And Vomiting   Dilaudid [hydromorphone Hcl] Nausea And Vomiting   Can take with zofran   Morphine And Related Nausea And Vomiting   Percocet [oxycodone-acetaminophen] Nausea And Vomiting   Vicodin [hydrocodone-acetaminophen] Nausea And Vomiting      Medication List       Accurate as of Aug 26, 2018 11:17 AM. If you have any questions, ask your nurse or doctor.        benazepril 20 MG tablet Commonly known as:  LOTENSIN Take 20 mg by mouth every morning.   Biotin 10000 MCG Tabs Take 10,000 mcg by mouth daily.   Coumadin 7.5 MG tablet Generic drug:  warfarin Take as directed by the anticoagulation clinic. If you are unsure how to take this medication, talk to your nurse or doctor. Original instructions:  TAKE 1 TABLET (7.5 MG TOTAL) BY MOUTH DAILY.   FreeStyle Libre 14 Day Reader Kerrin Mo See admin instructions.   FreeStyle [employer] 14 Day Sensor Misc See admin instructions.   glucose 4 GM chewable tablet Chew 16 g by mouth as needed for low blood sugar.   insulin glargine 100 UNIT/ML injection Commonly known as:  LANTUS Inject 18 Units into the skin at bedtime. If CBG greater than 120, take 19 units.   NEOMYCIN-POLYMYXIN-HYDROCORTISONE 1 % Soln OTIC solution Commonly known as:  CORTISPORIN Place 3 drops into the left ear 4 (four) times daily.   NON FORMULARY Carbon-60 two tablespoons daily   NON FORMULARY Serrapeptase 3 capsules daily   NON FORMULARY Curcumin  two capsules in AM   Premarin vaginal cream Generic drug:  conjugated estrogens Place 1 Applicatorful vaginally 2 (two) times a week. Monday and Wednesday or Thursday.   propranolol 80 MG tablet Commonly known as:  INDERAL Take 80 mg by mouth every morning.   vitamin B-12 1000 MCG tablet Commonly known as:  CYANOCOBALAMIN Take 1,000 mcg by mouth daily. Take 2 tablets of 1000 mcg, a total of 2000 mcg once a day, per patient.   Vitamin D3 50 MCG  (2000 UT) capsule Take 2,000 Units by mouth daily.       Allergies:  Allergies  Allergen Reactions  . Caine-1 [Lidocaine Hcl] Anaphylaxis  . Lidocaine Hcl Anaphylaxis  . Morphine Anaphylaxis and Other (See Comments)  . Piperacillin Sod-Tazobactam So Itching    "scalp itch"-was given Benadryl to counteract. "scalp itch"-was given Benadryl to counteract. "scalp itch"-was given Benadryl to counteract.  . Codeine Nausea And Vomiting and Other (See Comments)  . Cyclosporine Other (See Comments)    burns burns burns  . Hydrocodone-Acetaminophen Nausea And Vomiting  . Hydromorphone Hcl Nausea And Vomiting    Can take with zofran  . Oxycodone-Acetaminophen Nausea And Vomiting  . Tramadol Nausea And Vomiting  . Dilaudid [Hydromorphone Hcl] Nausea And Vomiting    Can take with zofran  . Morphine And Related Nausea And Vomiting  . Percocet [Oxycodone-Acetaminophen] Nausea And Vomiting  . Vicodin [Hydrocodone-Acetaminophen] Nausea And Vomiting    Past Medical History, Surgical history, Social history, and Family History were reviewed and updated.  Review of Systems: All other 10 point review of systems is negative.   Physical Exam:  vitals were not taken for this visit.   Wt Readings from Last 3 Encounters:  06/26/18 150 lb (68 kg)  05/16/18 148 lb 12 oz (67.5 kg)  03/04/18 152 lb (68.9 kg)    Ocular: Sclerae unicteric, pupils equal, round and reactive to light Ear-nose-throat: Oropharynx clear, dentition fair Lymphatic: No cervical or supraclavicular adenopathy Lungs no rales or rhonchi, good excursion bilaterally Heart regular rate and rhythm, no murmur appreciated Abd soft, nontender, positive bowel sounds, no liver or spleen tip palpated on exam, no fluid wave  MSK no focal spinal tenderness, no joint edema Neuro: non-focal, well-oriented, appropriate affect Breasts: Deferred   Lab Results  Component Value Date   WBC 5.3 06/26/2018   HGB 14.9 06/26/2018   HCT 43.9  06/26/2018   MCV 99.1 06/26/2018   PLT 298 06/26/2018   Lab Results  Component Value Date   FERRITIN 117 01/19/2016   IRON 83 01/19/2016   TIBC 313 01/19/2016   UIBC 229 01/19/2016   IRONPCTSAT 27 01/19/2016   Lab Results  Component Value Date   RBC 4.43 06/26/2018   No results found for: KPAFRELGTCHN, LAMBDASER, KAPLAMBRATIO No results found for: IGGSERUM, IGA, IGMSERUM No results found for: Odetta Pink, SPEI   Chemistry      Component Value Date/Time   NA 143 06/26/2018 1149   NA 144 04/04/2017 1139   NA 140 09/07/2016 0946   K 4.3 06/26/2018 1149   K 3.5 04/04/2017 1139   K 4.1 09/07/2016 0946   CL 103 06/26/2018 1149   CL 103 04/04/2017 1139   CO2 33 (H) 06/26/2018 1149   CO2 33 04/04/2017 1139   CO2 28 09/07/2016 0946   BUN 20 06/26/2018 1149   BUN 15 04/04/2017 1139   BUN 22.6 09/07/2016 0946   CREATININE 0.76 06/26/2018 1149  CREATININE 0.9 04/04/2017 1139   CREATININE 0.8 09/07/2016 0946      Component Value Date/Time   CALCIUM 9.9 06/26/2018 1149   CALCIUM 9.5 04/04/2017 1139   CALCIUM 9.6 09/07/2016 0946   ALKPHOS 81 06/26/2018 1149   ALKPHOS 93 (H) 04/04/2017 1139   ALKPHOS 81 09/07/2016 0946   AST 25 06/26/2018 1149   AST 32 09/07/2016 0946   ALT 21 06/26/2018 1149   ALT 36 04/04/2017 1139   ALT 33 09/07/2016 0946   BILITOT 1.3 (H) 06/26/2018 1149   BILITOT 1.34 (H) 09/07/2016 0946       Impression and Plan: Laurie Green is a very pleasant 73 yo caucasian female with antiphospholipid antibody syndrome and recurrent lower extremity DVT. She also has history of NASH.   INR is therapeutic at 2.7. She will continue her same regimen with Coumadin. We will see her back in another 6 weeks.  She will contact our office with any questions or concerns. We can certainly see her sooner if need be.   Laverna Peace, NP 5/11/202011:17 AM

## 2018-09-14 ENCOUNTER — Emergency Department (HOSPITAL_BASED_OUTPATIENT_CLINIC_OR_DEPARTMENT_OTHER): Payer: PPO

## 2018-09-14 ENCOUNTER — Emergency Department (HOSPITAL_BASED_OUTPATIENT_CLINIC_OR_DEPARTMENT_OTHER)
Admission: EM | Admit: 2018-09-14 | Discharge: 2018-09-14 | Disposition: A | Discharge status: Home / Self Care | Payer: PPO | Attending: Emergency Medicine | Admitting: Emergency Medicine

## 2018-09-14 ENCOUNTER — Encounter (HOSPITAL_BASED_OUTPATIENT_CLINIC_OR_DEPARTMENT_OTHER): Payer: Self-pay | Admitting: *Deleted

## 2018-09-14 ENCOUNTER — Other Ambulatory Visit: Payer: Self-pay

## 2018-09-14 DIAGNOSIS — Z794 Long term (current) use of insulin: Secondary | ICD-10-CM | POA: Insufficient documentation

## 2018-09-14 DIAGNOSIS — Z9049 Acquired absence of other specified parts of digestive tract: Secondary | ICD-10-CM | POA: Insufficient documentation

## 2018-09-14 DIAGNOSIS — Z79899 Other long term (current) drug therapy: Secondary | ICD-10-CM | POA: Insufficient documentation

## 2018-09-14 DIAGNOSIS — R51 Headache: Secondary | ICD-10-CM | POA: Diagnosis not present

## 2018-09-14 DIAGNOSIS — R1013 Epigastric pain: Secondary | ICD-10-CM | POA: Insufficient documentation

## 2018-09-14 DIAGNOSIS — E119 Type 2 diabetes mellitus without complications: Secondary | ICD-10-CM | POA: Insufficient documentation

## 2018-09-14 DIAGNOSIS — J45909 Unspecified asthma, uncomplicated: Secondary | ICD-10-CM | POA: Insufficient documentation

## 2018-09-14 DIAGNOSIS — Z8673 Personal history of transient ischemic attack (TIA), and cerebral infarction without residual deficits: Secondary | ICD-10-CM | POA: Diagnosis not present

## 2018-09-14 DIAGNOSIS — R509 Fever, unspecified: Secondary | ICD-10-CM | POA: Diagnosis not present

## 2018-09-14 DIAGNOSIS — R140 Abdominal distension (gaseous): Secondary | ICD-10-CM | POA: Diagnosis not present

## 2018-09-14 DIAGNOSIS — I10 Essential (primary) hypertension: Secondary | ICD-10-CM | POA: Insufficient documentation

## 2018-09-14 DIAGNOSIS — R0602 Shortness of breath: Secondary | ICD-10-CM | POA: Diagnosis not present

## 2018-09-14 DIAGNOSIS — R519 Headache, unspecified: Secondary | ICD-10-CM

## 2018-09-14 DIAGNOSIS — R197 Diarrhea, unspecified: Secondary | ICD-10-CM | POA: Diagnosis not present

## 2018-09-14 LAB — COMPREHENSIVE METABOLIC PANEL
ALT: 34 U/L (ref 0–44)
AST: 51 U/L — ABNORMAL HIGH (ref 15–41)
Albumin: 4.1 g/dL (ref 3.5–5.0)
Alkaline Phosphatase: 82 U/L (ref 38–126)
Anion gap: 14 (ref 5–15)
BUN: 21 mg/dL (ref 8–23)
CO2: 18 mmol/L — ABNORMAL LOW (ref 22–32)
Calcium: 8.9 mg/dL (ref 8.9–10.3)
Chloride: 105 mmol/L (ref 98–111)
Creatinine, Ser: 0.78 mg/dL (ref 0.44–1.00)
GFR calc Af Amer: >60 mL/min (ref >60)
GFR calc non Af Amer: >60 mL/min (ref >60)
Glucose, Bld: 114 mg/dL — ABNORMAL HIGH (ref 70–99)
Potassium: 4.2 mmol/L (ref 3.5–5.1)
Sodium: 137 mmol/L (ref 135–145)
Total Bilirubin: 2 mg/dL — ABNORMAL HIGH (ref 0.3–1.2)
Total Protein: 7.1 g/dL (ref 6.5–8.1)

## 2018-09-14 LAB — URINALYSIS, ROUTINE W REFLEX MICROSCOPIC
Bilirubin Urine: NEGATIVE
Glucose, UA: NEGATIVE mg/dL
Hgb urine dipstick: NEGATIVE
Ketones, ur: 15 mg/dL — AB
Leukocytes,Ua: NEGATIVE
Nitrite: NEGATIVE
Protein, ur: NEGATIVE mg/dL
Specific Gravity, Urine: 1.02 (ref 1.005–1.030)
pH: 5.5 (ref 5.0–8.0)

## 2018-09-14 LAB — CBC WITH DIFFERENTIAL/PLATELET
Abs Immature Granulocytes: 0.03 10*3/uL (ref 0.00–0.07)
Basophils Absolute: 0.1 10*3/uL (ref 0.0–0.1)
Basophils Relative: 1 %
Eosinophils Absolute: 0.1 10*3/uL (ref 0.0–0.5)
Eosinophils Relative: 1 %
HCT: 45.3 % (ref 36.0–46.0)
Hemoglobin: 15.4 g/dL — ABNORMAL HIGH (ref 12.0–15.0)
Immature Granulocytes: 0 %
Lymphocytes Relative: 10 %
Lymphs Abs: 1.2 10*3/uL (ref 0.7–4.0)
MCH: 33.1 pg (ref 26.0–34.0)
MCHC: 34 g/dL (ref 30.0–36.0)
MCV: 97.4 fL (ref 80.0–100.0)
Monocytes Absolute: 1 10*3/uL (ref 0.1–1.0)
Monocytes Relative: 9 %
Neutro Abs: 9 10*3/uL — ABNORMAL HIGH (ref 1.7–7.7)
Neutrophils Relative %: 79 %
Platelets: 279 10*3/uL (ref 150–400)
RBC: 4.65 MIL/uL (ref 3.87–5.11)
RDW: 13 % (ref 11.5–15.5)
WBC: 11.4 10*3/uL — ABNORMAL HIGH (ref 4.0–10.5)
nRBC: 0 % (ref 0.0–0.2)

## 2018-09-14 LAB — LIPASE, BLOOD: Lipase: 35 U/L (ref 11–51)

## 2018-09-14 LAB — CBG MONITORING, ED: Glucose-Capillary: 113 mg/dL — ABNORMAL HIGH (ref 70–99)

## 2018-09-14 LAB — PROTIME-INR
INR: 1.9 — ABNORMAL HIGH (ref 0.8–1.2)
Prothrombin Time: 21.6 seconds — ABNORMAL HIGH (ref 11.4–15.2)

## 2018-09-14 LAB — TROPONIN I: Troponin I: <0.03 ng/mL (ref <0.03)

## 2018-09-14 MED ORDER — SODIUM CHLORIDE 0.9 % IV BOLUS
1000.0000 mL | Freq: Once | INTRAVENOUS | Status: AC
  Administered 2018-09-14: 1000 mL via INTRAVENOUS

## 2018-09-14 MED ORDER — IOHEXOL 300 MG/ML  SOLN
100.0000 mL | Freq: Once | INTRAMUSCULAR | Status: AC | PRN
  Administered 2018-09-14: 20:00:00 100 mL via INTRAVENOUS

## 2018-09-14 MED ORDER — ONDANSETRON HCL 4 MG/2ML IJ SOLN
4.0000 mg | Freq: Once | INTRAMUSCULAR | Status: AC
  Administered 2018-09-14: 4 mg via INTRAVENOUS
  Filled 2018-09-14: qty 2

## 2018-09-14 NOTE — ED Provider Notes (Signed)
[facility] Provider Note   CSN: [id number] Arrival date & time: 09/14/18  1737    History   Chief Complaint Chief Complaint  Patient presents with   Abdominal Pain    HPI Laurie Green is a 73 y.o. female history of antiphospholipid syndrome on Coumadin, celiac disease, diabetes, DVT, pancreatic cysts here presenting with abdominal pain, shortness of breath, headaches, diarrhea.  Patient states that she has been having epigastric pain and loose stools for the last 4 to 5 days.  Patient initially told me that she had some gallstones previously with similar symptoms.  She did have a cholecystectomy.  Upon chart review, patient actually had a pancreatic cyst and has been followed with serial MRIs.  Patient states that yesterday she had some right eye pain as well as headaches.  She also has some subjective shortness of breath but denies any fevers or chills.  She has some dysuria as well.     The history is provided by the patient.    Past Medical History:  Diagnosis Date   Anti-cardiolipin antibody syndrome (HCC)    antiphospholipid antibody syndrome   Antiphospholipid antibody with hypercoagulable state ([place]) 11/07/2012   Arthritis    Asthma    well controlled, no rescue inhaler used   Blood dyscrasia    anticardiolipid syndrome   Blood transfusion    age 72 yr old-none since   Celiac disease    Claustrophobia    Diabetes mellitus    Type II   Diverticulitis    DVT (deep venous thrombosis) ([place]) 05/01/2011   "to liver"   Fibromyalgia    GERD (gastroesophageal reflux disease)    11/01/16- not problem   H/O seasonal allergies    Heart murmur    "not a problem"   Hypertension    NASH (nonalcoholic steatohepatitis) 05/01/2011   Nausea & vomiting    11-17-14 "nausea and vomiting after meals" at present discussed with Dr. Oletta Lamas office per pt.   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   TIA (transient ischemic attack)    Varices 05/01/2011   Varices, esophageal (HCC)    Varices, gastric     Patient Active Problem List   Diagnosis Date Noted   Antiphospholipid antibody with hypercoagulable state ([place]) 11/07/2012   Transaminitis 09/07/2012   Fever, unspecified 09/07/2012   Abdominal pain 09/04/2012   Subtherapeutic international normalized ratio (INR) 09/04/2012   Diabetes mellitus ([place]) 09/04/2012   Leukocytosis, unspecified 09/04/2012   Varices, esophageal ([place]) 09/04/2012   Transaminasemia 09/04/2012   DVT (deep venous thrombosis) ([place]) 05/01/2011   Varices 05/01/2011   NASH (nonalcoholic steatohepatitis) 05/01/2011   Incisional hernia without mention of obstruction or gangrene 02/14/2011    Past Surgical History:  Procedure Laterality Date   ABDOMINAL HYSTERECTOMY     ANKLE FRACTURE SURGERY Left    retained hardware   CHOLECYSTECTOMY     COLON SURGERY     hx. diverticulitis   COLONOSCOPY N/A 11/23/2014   Procedure: COLONOSCOPY;  Surgeon: [name], MD;  Location: [facility] ENDOSCOPY;  Service: Endoscopy;  Laterality: N/A;   ESOPHAGOGASTRODUODENOSCOPY  04/28/2011   Procedure: ESOPHAGOGASTRODUODENOSCOPY (EGD);  Surgeon: [name]., MD;  Location: [facility] ENDOSCOPY;  Service: Endoscopy;  Laterality: N/A;   ESOPHAGOGASTRODUODENOSCOPY  N/A 11/23/2014   Procedure: ESOPHAGOGASTRODUODENOSCOPY (EGD);  Surgeon: [name], MD;  Location: [facility]COPY;  Service: Endoscopy;  Laterality: N/A;   ESOPHAGOGASTRODUODENOSCOPY (EGD) WITH PROPOFOL N/A 11/06/2016   Procedure: ESOPHAGOGASTRODUODENOSCOPY (EGD) WITH PROPOFOL;  Surgeon: [name], MD;  Location: [facility];  Service: Endoscopy;  Laterality: N/A;   EUS N/A 09/25/2012   Procedure: ESOPHAGEAL ENDOSCOPIC ULTRASOUND (EUS) RADIAL;  Surgeon: Arta Silence, MD;  Location: [facility]  ENDOSCOPY;  Service: Endoscopy;  Laterality: N/A;   HERNIA REPAIR     luq incisional   SPLENECTOMY, TOTAL     TONSILLECTOMY       OB History   No obstetric history on file.      Home Medications    Prior to Admission medications   Medication Sig Start Date End Date Taking? Authorizing Provider  benazepril (LOTENSIN) 20 MG tablet Take 20 mg by mouth every morning.    Yes [provider]  Biotin 10000 MCG TABS Take 10,000 mcg by mouth daily.   Yes [provider]  Cholecalciferol (VITAMIN D3) 2000 UNITS capsule Take 2,000 Units by mouth daily.   Yes [provider]  COUMADIN 7.5 MG tablet TAKE 1 TABLET (7.5 MG TOTAL) BY MOUTH DAILY. 07/23/17  Yes [name], MD  insulin glargine (LANTUS) 100 UNIT/ML injection Inject 18 Units into the skin at bedtime. If CBG greater than 120, take 19 units.   Yes [provider]  NEOMYCIN-POLYMYXIN-HYDROCORTISONE (CORTISPORIN) 1 % SOLN OTIC solution Place 3 drops into the left ear 4 (four) times daily. 08/15/17  Yes Ennever, Rudell Cobb, MD  Continuous Blood Gluc Receiver (FREESTYLE LIBRE 14 DAY READER) DEVI See admin instructions. 08/08/17   [provider]  Continuous Blood Gluc Sensor (FREESTYLE LIBRE 14 DAY SENSOR) MISC See admin instructions. 08/08/17   [provider]  glucose 4 GM chewable tablet Chew 16 g by mouth as needed for low blood sugar.    [provider]  NON FORMULARY Carbon-60 two tablespoons daily    [provider]  NON FORMULARY Serrapeptase 3 capsules daily    [provider]  NON FORMULARY Curcumin two capsules in AM    [provider]  PREMARIN vaginal cream Place 1 Applicatorful vaginally 2 (two) times a week. Monday and Wednesday or Thursday. 12/10/17   [name], MD  propranolol (INDERAL) 80 MG tablet Take 80 mg by mouth every morning.     [provider]  vitamin B-12 (CYANOCOBALAMIN) 1000 MCG tablet Take 1,000 mcg by mouth  daily. Take 2 tablets of 1000 mcg, a total of 2000 mcg once a day, per patient.    [provider]    Family History No family history on file.  Social History Social History   Tobacco Use   Smoking status: Never Smoker   Smokeless tobacco: Never Used   Tobacco comment: never used tobacco  Substance Use Topics   Alcohol use: No    Alcohol/week: 0.0 standard drinks   Drug use: No     Allergies   Caine-1 [lidocaine hcl]; Lidocaine hcl; Morphine; Piperacillin sod-tazobactam so; Codeine; Cyclosporine; Hydrocodone-acetaminophen; Hydromorphone hcl; Oxycodone-acetaminophen; Tramadol; Dilaudid [hydromorphone hcl]; Morphine and related; Percocet [oxycodone-acetaminophen]; and Vicodin [hydrocodone-acetaminophen]   Review of Systems Review of Systems  Respiratory: Positive for shortness of breath.   Gastrointestinal: Positive for abdominal pain and diarrhea.  All other systems reviewed and are negative.    Physical Exam Updated Vital Signs BP 135/78 (BP Location: Right Arm)    Pulse 77  Temp 99.2 F (37.3 C) (Oral)    Resp 18    Ht 5' 3"  (1.6 m)    Wt 66.2 kg    SpO2 93%    BMI 25.86 kg/m   Physical Exam Vitals signs and nursing note reviewed.  Constitutional:      Appearance: She is well-developed.     Comments: Anxious   HENT:     Head: Normocephalic.     Mouth/Throat:     Comments: MM slightly dry  Eyes:     Extraocular Movements: Extraocular movements intact.  Cardiovascular:     Heart sounds: Normal heart sounds.  Pulmonary:     Effort: Pulmonary effort is normal.     Breath sounds: Normal breath sounds.  Abdominal:     General: Abdomen is flat.     Comments: Mild epigastric tenderness, no rebound, no CVAT   Skin:    General: Skin is warm.     Capillary Refill: Capillary refill takes less than 2 seconds.  Neurological:     General: No focal deficit present.     Mental Status: She is alert and oriented to person, place, and time.     Comments:  CN 2- 12 intact, nl strength and sensation throughout. No temporal artery tenderness   Psychiatric:        Mood and Affect: Mood normal.        Behavior: Behavior normal.      ED Treatments / Results  Labs (all labs ordered are listed, but only abnormal results are displayed) Labs Reviewed  CBC WITH DIFFERENTIAL/PLATELET - Abnormal; Notable for the following components:      Result Value   WBC 11.4 (*)    Hemoglobin 15.4 (*)    Neutro Abs 9.0 (*)    All other components within normal limits  COMPREHENSIVE METABOLIC PANEL - Abnormal; Notable for the following components:   CO2 18 (*)    Glucose, Bld 114 (*)    AST 51 (*)    Total Bilirubin 2.0 (*)    All other components within normal limits  PROTIME-INR - Abnormal; Notable for the following components:   Prothrombin Time 21.6 (*)    INR 1.9 (*)    All other components within normal limits  CBG MONITORING, ED - Abnormal; Notable for the following components:   Glucose-Capillary 113 (*)    All other components within normal limits  TROPONIN I  LIPASE, BLOOD  URINALYSIS, ROUTINE W REFLEX MICROSCOPIC    EKG EKG Interpretation  Date/Time:  Saturday Sep 14 2018 18:33:02 EDT Ventricular Rate:  73 PR Interval:    QRS Duration: 94 QT Interval:  411 QTC Calculation: 453 R Axis:   22 Text Interpretation:  Sinus rhythm Borderline low voltage, extremity leads No significant change since last tracing Confirmed by Wandra Arthurs [phone]) on 09/14/2018 6:59:43 PM   Radiology Dg Chest 1 View  Result Date: 09/14/2018 CLINICAL DATA:  Fever EXAM: CHEST  1 VIEW COMPARISON:  10/04/2016 FINDINGS: Scarring in the lingula. Heart is normal size. No acute confluent airspace opacities or effusions. No acute bony abnormality. IMPRESSION: No active disease. Electronically Signed   By: Rolm Baptise M.D.   On: 09/14/2018 20:01   Ct Head Wo Contrast  Result Date: 09/14/2018 CLINICAL DATA:  Headache EXAM: CT HEAD WITHOUT CONTRAST TECHNIQUE:  Contiguous axial images were obtained from the base of the skull through the vertex without intravenous contrast. COMPARISON:  01/15/2016 FINDINGS: Brain: No acute intracranial abnormality. Specifically, no hemorrhage,  hydrocephalus, mass lesion, acute infarction, or significant intracranial injury. Vascular: No hyperdense vessel or unexpected calcification. Skull: No acute calvarial abnormality. Sinuses/Orbits: Visualized paranasal sinuses and mastoids clear. Orbital soft tissues unremarkable. Other: None IMPRESSION: Normal study. Electronically Signed   By: Rolm Baptise M.D.   On: 09/14/2018 19:42   Ct Abdomen Pelvis W Contrast  Result Date: 09/14/2018 CLINICAL DATA:  Abdominal distention.  Diarrhea for 5 days EXAM: CT ABDOMEN AND PELVIS WITH CONTRAST TECHNIQUE: Multidetector CT imaging of the abdomen and pelvis was performed using the standard protocol following bolus administration of intravenous contrast. CONTRAST:  163m OMNIPAQUE IOHEXOL 300 MG/ML  SOLN COMPARISON:  Abdominal MRI 06/05/2018 FINDINGS: Lower chest:  Periesophageal varices.  No acute finding Hepatobiliary: Chronic bile duct dilatation. Chronic intrahepatic bile duct dilatation. There has been prior cholecystectomy. Branching gas over the liver which is peripheral but aligned with the dilated ducts. Pneumobilia was also seen on prior MRI. No focal liver lesion. Pancreas: Unremarkable. Spleen: Surgically absent Adrenals/Urinary Tract: Negative adrenals. No hydronephrosis or stone. Unremarkable bladder. Stomach/Bowel: No obstruction. No appendicitis. History of diarrhea with no colonic wall thickening. There has been sigmoidectomy. Few remaining colonic diverticula. Varices are prominent along the rectum. Vascular/Lymphatic: Atherosclerotic calcification. 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 No mass or adenopathy. Reproductive:Hysterectomy Other: No ascites or pneumoperitoneum.  Fatty left spigelian hernia. There is asymmetric atrophy of the upper left rectus abdominus musculature. Musculoskeletal: Spinal degeneration with mild levocurvature. IMPRESSION: 1. No acute finding. There is history of diarrhea with no imaging evidence of colitis. 2. 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 3. Chronic intrahepatic bile duct dilatation with pneumobilia. 4. Fatty left abdominal hernia. Electronically Signed   By: JMonte FantasiaM.D.   On: 09/14/2018 20:07    Procedures Procedures (including critical care time)  Medications Ordered in ED Medications  sodium chloride 0.9 % bolus 1,000 mL (1,000 mLs Intravenous New Bag/Given 09/14/18 1821)  ondansetron (ZOFRAN) injection 4 mg (4 mg Intravenous Given 09/14/18 1824)  iohexol (OMNIPAQUE) 300 MG/ML solution 100 mL (100 mLs Intravenous Contrast Given 09/14/18 1941)     Initial Impression / Assessment and Plan / ED Course  I have reviewed the triage vital signs and the nursing notes.  Pertinent labs & imaging results that were available during my care of the patient were reviewed by me and considered in my medical decision making (see chart for details).       Laurie PROHASKAis a 73y.o. female here with headache, abdominal pain. She is on coumadin and has pancreatic cyst. Will get labs, UA, CT ab/pel, CT head. Will hydrate and reassess.   8:18 PM Labs showed bicarb 18, AG 14. Likely mild dehydration from gastro. UA normal. INR 1.9. Stable for discharge. Recommend follow up with her GI doctor   Final Clinical Impressions(s) / ED Diagnoses   Final diagnoses:  Fever    ED Discharge Orders    None       YDrenda Freeze MD 09/14/18 2046

## 2018-09-14 NOTE — Discharge Instructions (Signed)
Continue your current meds   Follow up with your GI doctor and primary care doctor   You likely have a stomach virus. Stay hydrated   Return to ER if you have worse abdominal pain, vomiting, fever

## 2018-09-14 NOTE — ED Triage Notes (Signed)
Pt reports she has had abdominal pain and diarrhea x 5 today. Hx of gallstones with similar Sx. She also c/o sharp pain in right eye last night and headache. She states she feels SOB and "I smell dampness in the air"

## 2018-09-19 ENCOUNTER — Other Ambulatory Visit: Payer: Self-pay | Admitting: Hematology & Oncology

## 2018-09-19 DIAGNOSIS — I82409 Acute embolism and thrombosis of unspecified deep veins of unspecified lower extremity: Secondary | ICD-10-CM

## 2018-10-07 ENCOUNTER — Inpatient Hospital Stay: Payer: PPO

## 2018-10-07 ENCOUNTER — Inpatient Hospital Stay: Payer: PPO | Attending: Hematology & Oncology | Admitting: Family

## 2018-10-07 ENCOUNTER — Other Ambulatory Visit: Payer: Self-pay

## 2018-10-07 VITALS — BP 150/65 | HR 54 | Resp 17 | Ht 63.0 in | Wt 149.0 lb

## 2018-10-07 DIAGNOSIS — D6861 Antiphospholipid syndrome: Secondary | ICD-10-CM | POA: Insufficient documentation

## 2018-10-07 DIAGNOSIS — Z7901 Long term (current) use of anticoagulants: Secondary | ICD-10-CM | POA: Insufficient documentation

## 2018-10-07 DIAGNOSIS — K7581 Nonalcoholic steatohepatitis (NASH): Secondary | ICD-10-CM

## 2018-10-07 DIAGNOSIS — Z794 Long term (current) use of insulin: Secondary | ICD-10-CM | POA: Diagnosis not present

## 2018-10-07 DIAGNOSIS — I82402 Acute embolism and thrombosis of unspecified deep veins of left lower extremity: Secondary | ICD-10-CM

## 2018-10-07 DIAGNOSIS — I82401 Acute embolism and thrombosis of unspecified deep veins of right lower extremity: Secondary | ICD-10-CM

## 2018-10-07 DIAGNOSIS — Z86718 Personal history of other venous thrombosis and embolism: Secondary | ICD-10-CM | POA: Diagnosis not present

## 2018-10-07 LAB — CBC WITH DIFFERENTIAL (CANCER CENTER ONLY)
Abs Immature Granulocytes: 0 10*3/uL (ref 0.00–0.07)
Basophils Absolute: 0.1 10*3/uL (ref 0.0–0.1)
Basophils Relative: 1 %
Eosinophils Absolute: 0.2 10*3/uL (ref 0.0–0.5)
Eosinophils Relative: 5 %
HCT: 43.7 % (ref 36.0–46.0)
Hemoglobin: 14.8 g/dL (ref 12.0–15.0)
Immature Granulocytes: 0 %
Lymphocytes Relative: 37 %
Lymphs Abs: 1.8 10*3/uL (ref 0.7–4.0)
MCH: 33.4 pg (ref 26.0–34.0)
MCHC: 33.9 g/dL (ref 30.0–36.0)
MCV: 98.6 fL (ref 80.0–100.0)
Monocytes Absolute: 0.8 10*3/uL (ref 0.1–1.0)
Monocytes Relative: 16 %
Neutro Abs: 2 10*3/uL (ref 1.7–7.7)
Neutrophils Relative %: 41 %
Platelet Count: 317 10*3/uL (ref 150–400)
RBC: 4.43 MIL/uL (ref 3.87–5.11)
RDW: 13.1 % (ref 11.5–15.5)
WBC Count: 4.9 10*3/uL (ref 4.0–10.5)
nRBC: 0 % (ref 0.0–0.2)

## 2018-10-07 LAB — CMP (CANCER CENTER ONLY)
ALT: 17 U/L (ref 0–44)
AST: 20 U/L (ref 15–41)
Albumin: 4.2 g/dL (ref 3.5–5.0)
Alkaline Phosphatase: 80 U/L (ref 38–126)
Anion gap: 8 (ref 5–15)
BUN: 21 mg/dL (ref 8–23)
CO2: 32 mmol/L (ref 22–32)
Calcium: 10.1 mg/dL (ref 8.9–10.3)
Chloride: 106 mmol/L (ref 98–111)
Creatinine: 0.83 mg/dL (ref 0.44–1.00)
GFR, Est AFR Am: >60 mL/min (ref >60)
GFR, Estimated: >60 mL/min (ref >60)
Glucose, Bld: 170 mg/dL — ABNORMAL HIGH (ref 70–99)
Potassium: 5 mmol/L (ref 3.5–5.1)
Sodium: 146 mmol/L — ABNORMAL HIGH (ref 135–145)
Total Bilirubin: 1.2 mg/dL (ref 0.3–1.2)
Total Protein: 6.8 g/dL (ref 6.5–8.1)

## 2018-10-07 LAB — PROTIME-INR
INR: 2.9 — ABNORMAL HIGH (ref 0.8–1.2)
Prothrombin Time: 29.6 seconds — ABNORMAL HIGH (ref 11.4–15.2)

## 2018-10-07 MED ORDER — WARFARIN SODIUM 7.5 MG PO TABS: 7.5000 mg | ORAL_TABLET | Freq: Every day | ORAL | 3 refills | Status: DC

## 2018-10-07 NOTE — Progress Notes (Signed)
Hematology and Oncology Follow Up Visit  Laurie ABBETT [id number] 11-03-1945 73 y.o. 10/07/2018   Principle Diagnosis:  Antiphospholipid antibody syndrome History of recurrent lower extremity deep venous thrombosis Nonalcoholic steatohepatitis  Current Therapy:   Coumadin - 7.5 mg po q day -to maintain an INR between 2-3, will change to [place] (generic warfarin in August)   Interim History:  Laurie Green is here today for follow-up. She is doing well and is happy to report that she got a new beagle named Daisy. This has been wonderful both for her and her other little dog.  She is doing well on Coumadin but apparently they are stopping the production of name brand coumadin. She will have to be switched to a generic warfarin in August once her coumadin runs out.  INR today is stable at 2.9. No episodes of bleeding, no bruising or petechiae.  No fever, chills, n/v, cough, rash, dizziness, SOB, chest pain, palpitations, abdominal pain or changes in bowel or bladder habits.  No swelling or tenderness in her extremities.  No lymphadenopathy noted on exam.  She is eating well and staying hydrated. Her weight is stable.   ECOG Performance Status: 1 - Symptomatic but completely ambulatory  Medications:  Allergies as of 10/07/2018      Reactions   Caine-1 [lidocaine Hcl] Anaphylaxis   Hydrocodone Anaphylaxis   Lidocaine Hcl Anaphylaxis   Morphine Anaphylaxis, Other (See Comments)   Piperacillin Sod-tazobactam So Itching   "scalp itch"-was given Benadryl to counteract. "scalp itch"-was given Benadryl to counteract. "scalp itch"-was given Benadryl to counteract.   Codeine Nausea And Vomiting, Other (See Comments)   Cyclosporine Other (See Comments)   burns burns burns   Hydrocodone-acetaminophen Nausea And Vomiting   Hydromorphone Hcl Nausea And Vomiting   Can take with zofran   Oxycodone-acetaminophen Nausea And Vomiting   Tramadol Nausea And Vomiting   Dilaudid  [hydromorphone Hcl] Nausea And Vomiting   Can take with zofran   Morphine And Related Nausea And Vomiting   Percocet [oxycodone-acetaminophen] Nausea And Vomiting   Vicodin [hydrocodone-acetaminophen] Nausea And Vomiting      Medication List       Accurate as of October 07, 2018 12:25 PM. If you have any questions, ask your nurse or doctor.        benazepril 20 MG tablet Commonly known as: LOTENSIN Take 20 mg by mouth every morning.   Biotin 10000 MCG Tabs Take 10,000 mcg by mouth daily.   Coumadin 7.5 MG tablet Generic drug: warfarin Take as directed by the anticoagulation clinic. If you are unsure how to take this medication, talk to your nurse or doctor. Original instructions: TAKE 1 TABLET (7.5 MG TOTAL) BY MOUTH DAILY.   FreeStyle Libre 14 Day Reader Kerrin Mo See admin instructions.   FreeStyle [employer] 14 Day Sensor Misc See admin instructions.   glucose 4 GM chewable tablet Chew 16 g by mouth as needed for low blood sugar.   insulin glargine 100 UNIT/ML injection Commonly known as: LANTUS Inject 18 Units into the skin at bedtime. If CBG greater than 120, take 19 units.   NEOMYCIN-POLYMYXIN-HYDROCORTISONE 1 % Soln OTIC solution Commonly known as: CORTISPORIN Place 3 drops into the left ear 4 (four) times daily.   NON FORMULARY Carbon-60 two tablespoons daily   NON FORMULARY Serrapeptase 3 capsules daily   NON FORMULARY Curcumin two capsules in AM   Premarin vaginal cream Generic drug: conjugated estrogens Place 1 Applicatorful vaginally 2 (two) times a week. Monday and Wednesday  or Thursday.   propranolol 80 MG tablet Commonly known as: INDERAL Take 80 mg by mouth every morning.   traMADol 50 MG tablet Commonly known as: ULTRAM   vitamin B-12 1000 MCG tablet Commonly known as: CYANOCOBALAMIN Take 1,000 mcg by mouth daily. Take 2 tablets of 1000 mcg, a total of 2000 mcg once a day, per patient.   Vitamin D3 50 MCG (2000 UT) capsule Take 2,000 Units by  mouth daily.       Allergies:  Allergies  Allergen Reactions  . Caine-1 [Lidocaine Hcl] Anaphylaxis  . Hydrocodone Anaphylaxis  . Lidocaine Hcl Anaphylaxis  . Morphine Anaphylaxis and Other (See Comments)  . Piperacillin Sod-Tazobactam So Itching    "scalp itch"-was given Benadryl to counteract. "scalp itch"-was given Benadryl to counteract. "scalp itch"-was given Benadryl to counteract.  . Codeine Nausea And Vomiting and Other (See Comments)  . Cyclosporine Other (See Comments)    burns burns burns  . Hydrocodone-Acetaminophen Nausea And Vomiting  . Hydromorphone Hcl Nausea And Vomiting    Can take with zofran  . Oxycodone-Acetaminophen Nausea And Vomiting  . Tramadol Nausea And Vomiting  . Dilaudid [Hydromorphone Hcl] Nausea And Vomiting    Can take with zofran  . Morphine And Related Nausea And Vomiting  . Percocet [Oxycodone-Acetaminophen] Nausea And Vomiting  . Vicodin [Hydrocodone-Acetaminophen] Nausea And Vomiting    Past Medical History, Surgical history, Social history, and Family History were reviewed and updated.  Review of Systems: All other 10 point review of systems is negative.   Physical Exam:  height is 5' 3"  (1.6 m) and weight is 149 lb (67.6 kg). Her blood pressure is 150/65 (abnormal) and her pulse is 54 (abnormal). Her respiration is 17 and oxygen saturation is 99%.   Wt Readings from Last 3 Encounters:  10/07/18 149 lb (67.6 kg)  09/14/18 146 lb (66.2 kg)  08/26/18 152 lb (68.9 kg)    Ocular: Sclerae unicteric, pupils equal, round and reactive to light Ear-nose-throat: Oropharynx clear, dentition fair Lymphatic: No cervical or supraclavicular adenopathy Lungs no rales or rhonchi, good excursion bilaterally Heart regular rate and rhythm, no murmur appreciated Abd soft, nontender, positive bowel sounds, no liver or spleen tip palpated on exam, no fluid wave  MSK no focal spinal tenderness, no joint edema Neuro: non-focal, well-oriented,  appropriate affect Breasts: Deferred   Lab Results  Component Value Date   WBC 4.9 10/07/2018   HGB 14.8 10/07/2018   HCT 43.7 10/07/2018   MCV 98.6 10/07/2018   PLT 317 10/07/2018   Lab Results  Component Value Date   FERRITIN 117 01/19/2016   IRON 83 01/19/2016   TIBC 313 01/19/2016   UIBC 229 01/19/2016   IRONPCTSAT 27 01/19/2016   Lab Results  Component Value Date   RBC 4.43 10/07/2018   No results found for: KPAFRELGTCHN, LAMBDASER, KAPLAMBRATIO No results found for: IGGSERUM, IGA, IGMSERUM No results found for: Kathrynn Ducking, MSPIKE, SPEI   Chemistry      Component Value Date/Time   NA 146 (H) 10/07/2018 1105   NA 144 04/04/2017 1139   NA 140 09/07/2016 0946   K 5.0 10/07/2018 1105   K 3.5 04/04/2017 1139   K 4.1 09/07/2016 0946   CL 106 10/07/2018 1105   CL 103 04/04/2017 1139   CO2 32 10/07/2018 1105   CO2 33 04/04/2017 1139   CO2 28 09/07/2016 0946   BUN 21 10/07/2018 1105   BUN 15 04/04/2017 1139  BUN 22.6 09/07/2016 0946   CREATININE 0.83 10/07/2018 1105   CREATININE 0.9 04/04/2017 1139   CREATININE 0.8 09/07/2016 0946      Component Value Date/Time   CALCIUM 10.1 10/07/2018 1105   CALCIUM 9.5 04/04/2017 1139   CALCIUM 9.6 09/07/2016 0946   ALKPHOS 80 10/07/2018 1105   ALKPHOS 93 (H) 04/04/2017 1139   ALKPHOS 81 09/07/2016 0946   AST 20 10/07/2018 1105   AST 32 09/07/2016 0946   ALT 17 10/07/2018 1105   ALT 36 04/04/2017 1139   ALT 33 09/07/2016 0946   BILITOT 1.2 10/07/2018 1105   BILITOT 1.34 (H) 09/07/2016 0946       Impression and Plan: Laurie Green is a very pleasant 73 yo caucasian female with antiphospholipid antibody syndrome and recurrent lower extremity DVT. She also has history of NASH.   INR is stable at 2.9. She continues to do well.  We will plan to switch her to [place] in August once she finishes her supply of Coumadin. When she switches we will have her come back after 2 weeks  for INR check.  We will plan to see her back in 6 weeks. She will contact our office with any questions or concerns.  We can certainly see her sooner if needed.   Laverna Peace, NP 6/22/202012:25 PM

## 2018-10-08 ENCOUNTER — Telehealth: Payer: Self-pay | Admitting: Hematology & Oncology

## 2018-10-08 NOTE — Telephone Encounter (Signed)
Appointments scheduled LMVM per 6/22 los

## 2018-11-21 ENCOUNTER — Other Ambulatory Visit: Payer: Self-pay

## 2018-11-21 ENCOUNTER — Encounter: Payer: Self-pay | Admitting: Hematology & Oncology

## 2018-11-21 ENCOUNTER — Inpatient Hospital Stay: Payer: PPO | Attending: Hematology & Oncology

## 2018-11-21 ENCOUNTER — Inpatient Hospital Stay (HOSPITAL_BASED_OUTPATIENT_CLINIC_OR_DEPARTMENT_OTHER): Payer: PPO | Admitting: Hematology & Oncology

## 2018-11-21 VITALS — BP 153/77 | HR 64 | Temp 97.4°F | Resp 20 | Wt 151.4 lb

## 2018-11-21 DIAGNOSIS — K7581 Nonalcoholic steatohepatitis (NASH): Secondary | ICD-10-CM | POA: Insufficient documentation

## 2018-11-21 DIAGNOSIS — I82402 Acute embolism and thrombosis of unspecified deep veins of left lower extremity: Secondary | ICD-10-CM

## 2018-11-21 DIAGNOSIS — D6861 Antiphospholipid syndrome: Secondary | ICD-10-CM

## 2018-11-21 DIAGNOSIS — Z79899 Other long term (current) drug therapy: Secondary | ICD-10-CM | POA: Diagnosis not present

## 2018-11-21 DIAGNOSIS — Z7901 Long term (current) use of anticoagulants: Secondary | ICD-10-CM | POA: Insufficient documentation

## 2018-11-21 DIAGNOSIS — Z86718 Personal history of other venous thrombosis and embolism: Secondary | ICD-10-CM | POA: Diagnosis not present

## 2018-11-21 DIAGNOSIS — E119 Type 2 diabetes mellitus without complications: Secondary | ICD-10-CM | POA: Insufficient documentation

## 2018-11-21 DIAGNOSIS — Z794 Long term (current) use of insulin: Secondary | ICD-10-CM | POA: Diagnosis not present

## 2018-11-21 LAB — CMP (CANCER CENTER ONLY)
ALT: 20 U/L (ref 0–44)
AST: 22 U/L (ref 15–41)
Albumin: 3.9 g/dL (ref 3.5–5.0)
Alkaline Phosphatase: 81 U/L (ref 38–126)
Anion gap: 7 (ref 5–15)
BUN: 17 mg/dL (ref 8–23)
CO2: 34 mmol/L — ABNORMAL HIGH (ref 22–32)
Calcium: 9.4 mg/dL (ref 8.9–10.3)
Chloride: 105 mmol/L (ref 98–111)
Creatinine: 0.78 mg/dL (ref 0.44–1.00)
GFR, Est AFR Am: >60 mL/min (ref >60)
GFR, Estimated: >60 mL/min (ref >60)
Glucose, Bld: 180 mg/dL — ABNORMAL HIGH (ref 70–99)
Potassium: 4.6 mmol/L (ref 3.5–5.1)
Sodium: 146 mmol/L — ABNORMAL HIGH (ref 135–145)
Total Bilirubin: 1.6 mg/dL — ABNORMAL HIGH (ref 0.3–1.2)
Total Protein: 6.6 g/dL (ref 6.5–8.1)

## 2018-11-21 LAB — CBC WITH DIFFERENTIAL (CANCER CENTER ONLY)
Abs Immature Granulocytes: 0.01 10*3/uL (ref 0.00–0.07)
Basophils Absolute: 0.1 10*3/uL (ref 0.0–0.1)
Basophils Relative: 1 %
Eosinophils Absolute: 0.2 10*3/uL (ref 0.0–0.5)
Eosinophils Relative: 4 %
HCT: 44.6 % (ref 36.0–46.0)
Hemoglobin: 15 g/dL (ref 12.0–15.0)
Immature Granulocytes: 0 %
Lymphocytes Relative: 32 %
Lymphs Abs: 1.8 10*3/uL (ref 0.7–4.0)
MCH: 32.8 pg (ref 26.0–34.0)
MCHC: 33.6 g/dL (ref 30.0–36.0)
MCV: 97.6 fL (ref 80.0–100.0)
Monocytes Absolute: 0.8 10*3/uL (ref 0.1–1.0)
Monocytes Relative: 14 %
Neutro Abs: 2.7 10*3/uL (ref 1.7–7.7)
Neutrophils Relative %: 49 %
Platelet Count: 293 10*3/uL (ref 150–400)
RBC: 4.57 MIL/uL (ref 3.87–5.11)
RDW: 12.9 % (ref 11.5–15.5)
WBC Count: 5.7 10*3/uL (ref 4.0–10.5)
nRBC: 0 % (ref 0.0–0.2)

## 2018-11-21 LAB — PROTIME-INR
INR: 2.6 — ABNORMAL HIGH (ref 0.8–1.2)
Prothrombin Time: 27.7 seconds — ABNORMAL HIGH (ref 11.4–15.2)

## 2018-11-21 MED ORDER — TRAZODONE HCL 50 MG PO TABS: 50.0000 mg | ORAL_TABLET | Freq: Every day | ORAL | 0 refills | Status: DC

## 2018-11-21 MED ORDER — APIXABAN 5 MG PO TABS: 5.0000 mg | ORAL_TABLET | Freq: Two times a day (BID) | ORAL | 6 refills | Status: DC
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
## 2018-12-18 ENCOUNTER — Other Ambulatory Visit: Payer: Self-pay | Admitting: Hematology & Oncology

## 2019-01-02 ENCOUNTER — Other Ambulatory Visit: Payer: PPO

## 2019-01-02 ENCOUNTER — Ambulatory Visit: Payer: PPO | Admitting: Hematology & Oncology

## 2019-01-06 DIAGNOSIS — Z1231 Encounter for screening mammogram for malignant neoplasm of breast: Secondary | ICD-10-CM | POA: Diagnosis not present

## 2019-01-06 DIAGNOSIS — Z803 Family history of malignant neoplasm of breast: Secondary | ICD-10-CM | POA: Diagnosis not present

## 2019-01-07 DIAGNOSIS — I8391 Asymptomatic varicose veins of right lower extremity: Secondary | ICD-10-CM | POA: Diagnosis not present

## 2019-01-07 DIAGNOSIS — L820 Inflamed seborrheic keratosis: Secondary | ICD-10-CM | POA: Diagnosis not present

## 2019-01-07 DIAGNOSIS — I788 Other diseases of capillaries: Secondary | ICD-10-CM | POA: Diagnosis not present

## 2019-01-07 DIAGNOSIS — L570 Actinic keratosis: Secondary | ICD-10-CM | POA: Diagnosis not present

## 2019-01-07 DIAGNOSIS — L814 Other melanin hyperpigmentation: Secondary | ICD-10-CM | POA: Diagnosis not present

## 2019-01-08 ENCOUNTER — Inpatient Hospital Stay: Payer: PPO

## 2019-01-08 ENCOUNTER — Other Ambulatory Visit: Payer: Self-pay

## 2019-01-08 ENCOUNTER — Encounter: Payer: Self-pay | Admitting: Hematology & Oncology

## 2019-01-08 ENCOUNTER — Telehealth: Payer: Self-pay | Admitting: Hematology & Oncology

## 2019-01-08 ENCOUNTER — Inpatient Hospital Stay: Payer: PPO | Attending: Hematology & Oncology | Admitting: Hematology & Oncology

## 2019-01-08 VITALS — BP 139/66 | HR 63 | Temp 97.5°F | Resp 20 | Wt 153.0 lb

## 2019-01-08 DIAGNOSIS — E785 Hyperlipidemia, unspecified: Secondary | ICD-10-CM | POA: Diagnosis not present

## 2019-01-08 DIAGNOSIS — K760 Fatty (change of) liver, not elsewhere classified: Secondary | ICD-10-CM

## 2019-01-08 DIAGNOSIS — Z7901 Long term (current) use of anticoagulants: Secondary | ICD-10-CM | POA: Diagnosis not present

## 2019-01-08 DIAGNOSIS — I10 Essential (primary) hypertension: Secondary | ICD-10-CM | POA: Diagnosis not present

## 2019-01-08 DIAGNOSIS — D6861 Antiphospholipid syndrome: Secondary | ICD-10-CM

## 2019-01-08 DIAGNOSIS — Z86718 Personal history of other venous thrombosis and embolism: Secondary | ICD-10-CM | POA: Diagnosis not present

## 2019-01-08 DIAGNOSIS — E119 Type 2 diabetes mellitus without complications: Secondary | ICD-10-CM | POA: Diagnosis not present

## 2019-01-08 DIAGNOSIS — K7581 Nonalcoholic steatohepatitis (NASH): Secondary | ICD-10-CM | POA: Diagnosis not present

## 2019-01-08 DIAGNOSIS — I82401 Acute embolism and thrombosis of unspecified deep veins of right lower extremity: Secondary | ICD-10-CM

## 2019-01-08 DIAGNOSIS — E611 Iron deficiency: Secondary | ICD-10-CM | POA: Diagnosis not present

## 2019-01-08 DIAGNOSIS — R5382 Chronic fatigue, unspecified: Secondary | ICD-10-CM | POA: Diagnosis not present

## 2019-01-08 DIAGNOSIS — Z79899 Other long term (current) drug therapy: Secondary | ICD-10-CM | POA: Insufficient documentation

## 2019-01-08 DIAGNOSIS — Z794 Long term (current) use of insulin: Secondary | ICD-10-CM | POA: Insufficient documentation

## 2019-01-08 LAB — CBC WITH DIFFERENTIAL (CANCER CENTER ONLY)
Abs Immature Granulocytes: 0.01 10*3/uL (ref 0.00–0.07)
Basophils Absolute: 0.1 10*3/uL (ref 0.0–0.1)
Basophils Relative: 1 %
Eosinophils Absolute: 0.3 10*3/uL (ref 0.0–0.5)
Eosinophils Relative: 4 %
HCT: 43.5 % (ref 36.0–46.0)
Hemoglobin: 14.8 g/dL (ref 12.0–15.0)
Immature Granulocytes: 0 %
Lymphocytes Relative: 31 %
Lymphs Abs: 1.9 10*3/uL (ref 0.7–4.0)
MCH: 33 pg (ref 26.0–34.0)
MCHC: 34 g/dL (ref 30.0–36.0)
MCV: 97.1 fL (ref 80.0–100.0)
Monocytes Absolute: 0.8 10*3/uL (ref 0.1–1.0)
Monocytes Relative: 13 %
Neutro Abs: 3.3 10*3/uL (ref 1.7–7.7)
Neutrophils Relative %: 51 %
Platelet Count: 281 10*3/uL (ref 150–400)
RBC: 4.48 MIL/uL (ref 3.87–5.11)
RDW: 13.2 % (ref 11.5–15.5)
WBC Count: 6.3 10*3/uL (ref 4.0–10.5)
nRBC: 0 % (ref 0.0–0.2)

## 2019-01-08 LAB — PROTIME-INR
INR: 1.3 — ABNORMAL HIGH (ref 0.8–1.2)
Prothrombin Time: 16.2 seconds — ABNORMAL HIGH (ref 11.4–15.2)

## 2019-01-08 LAB — CMP (CANCER CENTER ONLY)
ALT: 21 U/L (ref 0–44)
AST: 24 U/L (ref 15–41)
Albumin: 4.2 g/dL (ref 3.5–5.0)
Alkaline Phosphatase: 74 U/L (ref 38–126)
Anion gap: 8 (ref 5–15)
BUN: 19 mg/dL (ref 8–23)
CO2: 31 mmol/L (ref 22–32)
Calcium: 9.7 mg/dL (ref 8.9–10.3)
Chloride: 105 mmol/L (ref 98–111)
Creatinine: 0.81 mg/dL (ref 0.44–1.00)
GFR, Est AFR Am: >60 mL/min (ref >60)
GFR, Estimated: >60 mL/min (ref >60)
Glucose, Bld: 179 mg/dL — ABNORMAL HIGH (ref 70–99)
Potassium: 4 mmol/L (ref 3.5–5.1)
Sodium: 144 mmol/L (ref 135–145)
Total Bilirubin: 1.8 mg/dL — ABNORMAL HIGH (ref 0.3–1.2)
Total Protein: 7 g/dL (ref 6.5–8.1)

## 2019-01-08 NOTE — Telephone Encounter (Signed)
lmom to inform patient of 11/11 appt at 1015 am per 9/23 LOS
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
## 2019-01-20 ENCOUNTER — Encounter: Payer: Self-pay | Admitting: Hematology & Oncology

## 2019-02-06 DIAGNOSIS — H2513 Age-related nuclear cataract, bilateral: Secondary | ICD-10-CM | POA: Diagnosis not present

## 2019-02-06 DIAGNOSIS — E119 Type 2 diabetes mellitus without complications: Secondary | ICD-10-CM | POA: Diagnosis not present

## 2019-02-26 ENCOUNTER — Inpatient Hospital Stay: Payer: PPO | Attending: Hematology & Oncology | Admitting: Hematology & Oncology

## 2019-02-26 ENCOUNTER — Inpatient Hospital Stay: Payer: PPO

## 2019-02-26 ENCOUNTER — Other Ambulatory Visit: Payer: Self-pay

## 2019-02-26 ENCOUNTER — Telehealth: Payer: Self-pay | Admitting: *Deleted

## 2019-02-26 ENCOUNTER — Encounter: Payer: Self-pay | Admitting: Hematology & Oncology

## 2019-02-26 VITALS — BP 148/74 | HR 62 | Temp 97.4°F | Resp 20 | Wt 150.1 lb

## 2019-02-26 DIAGNOSIS — D6861 Antiphospholipid syndrome: Secondary | ICD-10-CM | POA: Diagnosis not present

## 2019-02-26 DIAGNOSIS — K7581 Nonalcoholic steatohepatitis (NASH): Secondary | ICD-10-CM | POA: Insufficient documentation

## 2019-02-26 DIAGNOSIS — E119 Type 2 diabetes mellitus without complications: Secondary | ICD-10-CM | POA: Diagnosis not present

## 2019-02-26 DIAGNOSIS — Z79899 Other long term (current) drug therapy: Secondary | ICD-10-CM | POA: Insufficient documentation

## 2019-02-26 DIAGNOSIS — Z794 Long term (current) use of insulin: Secondary | ICD-10-CM | POA: Diagnosis not present

## 2019-02-26 DIAGNOSIS — Z7901 Long term (current) use of anticoagulants: Secondary | ICD-10-CM | POA: Diagnosis not present

## 2019-02-26 DIAGNOSIS — Z86718 Personal history of other venous thrombosis and embolism: Secondary | ICD-10-CM | POA: Insufficient documentation

## 2019-02-26 LAB — CBC WITH DIFFERENTIAL (CANCER CENTER ONLY)
Abs Immature Granulocytes: 0.01 10*3/uL (ref 0.00–0.07)
Basophils Absolute: 0.1 10*3/uL (ref 0.0–0.1)
Basophils Relative: 1 %
Eosinophils Absolute: 0.2 10*3/uL (ref 0.0–0.5)
Eosinophils Relative: 4 %
HCT: 43.4 % (ref 36.0–46.0)
Hemoglobin: 15 g/dL (ref 12.0–15.0)
Immature Granulocytes: 0 %
Lymphocytes Relative: 29 %
Lymphs Abs: 1.9 10*3/uL (ref 0.7–4.0)
MCH: 33.9 pg (ref 26.0–34.0)
MCHC: 34.6 g/dL (ref 30.0–36.0)
MCV: 98.2 fL (ref 80.0–100.0)
Monocytes Absolute: 0.7 10*3/uL (ref 0.1–1.0)
Monocytes Relative: 11 %
Neutro Abs: 3.6 10*3/uL (ref 1.7–7.7)
Neutrophils Relative %: 55 %
Platelet Count: 287 10*3/uL (ref 150–400)
RBC: 4.42 MIL/uL (ref 3.87–5.11)
RDW: 12.6 % (ref 11.5–15.5)
WBC Count: 6.5 10*3/uL (ref 4.0–10.5)
nRBC: 0 % (ref 0.0–0.2)

## 2019-02-26 LAB — PROTIME-INR
INR: 1.4 — ABNORMAL HIGH (ref 0.8–1.2)
Prothrombin Time: 16.6 seconds — ABNORMAL HIGH (ref 11.4–15.2)

## 2019-02-26 LAB — CMP (CANCER CENTER ONLY)
ALT: 19 U/L (ref 0–44)
AST: 23 U/L (ref 15–41)
Albumin: 4.3 g/dL (ref 3.5–5.0)
Alkaline Phosphatase: 72 U/L (ref 38–126)
Anion gap: 8 (ref 5–15)
BUN: 21 mg/dL (ref 8–23)
CO2: 31 mmol/L (ref 22–32)
Calcium: 9.8 mg/dL (ref 8.9–10.3)
Chloride: 103 mmol/L (ref 98–111)
Creatinine: 0.78 mg/dL (ref 0.44–1.00)
GFR, Est AFR Am: >60 mL/min (ref >60)
GFR, Estimated: >60 mL/min (ref >60)
Glucose, Bld: 166 mg/dL — ABNORMAL HIGH (ref 70–99)
Potassium: 3.9 mmol/L (ref 3.5–5.1)
Sodium: 142 mmol/L (ref 135–145)
Total Bilirubin: 0.9 mg/dL (ref 0.3–1.2)
Total Protein: 6.8 g/dL (ref 6.5–8.1)

## 2019-02-26 LAB — HEMOGLOBIN A1C
Hgb A1c MFr Bld: 7.2 % — ABNORMAL HIGH (ref 4.8–5.6)
Mean Plasma Glucose: 159.94 mg/dL

## 2019-02-26 NOTE — Telephone Encounter (Signed)
-----   Message from [name], MD sent at 02/26/2019  2:46 PM EST ----- Call - the hemoglobin A1C is 7.2.  Not too bad!!  [name]

## 2019-02-26 NOTE — Telephone Encounter (Signed)
Notified pt of results 
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
## 2019-02-26 NOTE — Telephone Encounter (Signed)
-----   Message from [name], MD sent at 02/26/2019  2:46 PM EST ----- Call - the hemoglobin A1C is 7.2.  Not too bad!!  Laurie Green

## 2019-02-26 NOTE — Telephone Encounter (Signed)
Message left to notify pt per order of Dr. Marin Olp that "the hemoglobin A1C is 7.2.  Not too bad!!"  Instructed pt to call office back with any questions or concerns.

## 2019-04-18 DIAGNOSIS — J189 Pneumonia, unspecified organism: Secondary | ICD-10-CM

## 2019-04-18 HISTORY — DX: Pneumonia, unspecified organism: J18.9

## 2019-05-07 ENCOUNTER — Inpatient Hospital Stay (HOSPITAL_BASED_OUTPATIENT_CLINIC_OR_DEPARTMENT_OTHER): Payer: PPO | Admitting: Hematology & Oncology

## 2019-05-07 ENCOUNTER — Other Ambulatory Visit: Payer: Self-pay

## 2019-05-07 ENCOUNTER — Inpatient Hospital Stay: Payer: PPO | Attending: Hematology & Oncology

## 2019-05-07 ENCOUNTER — Telehealth: Payer: Self-pay | Admitting: Family

## 2019-05-07 ENCOUNTER — Encounter: Payer: Self-pay | Admitting: Hematology & Oncology

## 2019-05-07 VITALS — BP 145/66 | HR 61 | Temp 97.3°F | Resp 18 | Wt 152.0 lb

## 2019-05-07 DIAGNOSIS — I82401 Acute embolism and thrombosis of unspecified deep veins of right lower extremity: Secondary | ICD-10-CM | POA: Diagnosis not present

## 2019-05-07 DIAGNOSIS — E119 Type 2 diabetes mellitus without complications: Secondary | ICD-10-CM | POA: Insufficient documentation

## 2019-05-07 DIAGNOSIS — Z794 Long term (current) use of insulin: Secondary | ICD-10-CM | POA: Diagnosis not present

## 2019-05-07 DIAGNOSIS — D6861 Antiphospholipid syndrome: Secondary | ICD-10-CM

## 2019-05-07 DIAGNOSIS — E0800 Diabetes mellitus due to underlying condition with hyperosmolarity without nonketotic hyperglycemic-hyperosmolar coma (NKHHC): Secondary | ICD-10-CM | POA: Diagnosis not present

## 2019-05-07 DIAGNOSIS — K7581 Nonalcoholic steatohepatitis (NASH): Secondary | ICD-10-CM | POA: Insufficient documentation

## 2019-05-07 DIAGNOSIS — Z7901 Long term (current) use of anticoagulants: Secondary | ICD-10-CM | POA: Insufficient documentation

## 2019-05-07 DIAGNOSIS — Z86718 Personal history of other venous thrombosis and embolism: Secondary | ICD-10-CM | POA: Insufficient documentation

## 2019-05-07 DIAGNOSIS — Z79899 Other long term (current) drug therapy: Secondary | ICD-10-CM | POA: Insufficient documentation

## 2019-05-07 LAB — CMP (CANCER CENTER ONLY)
ALT: 19 U/L (ref 0–44)
AST: 20 U/L (ref 15–41)
Albumin: 4.1 g/dL (ref 3.5–5.0)
Alkaline Phosphatase: 78 U/L (ref 38–126)
Anion gap: 8 (ref 5–15)
BUN: 21 mg/dL (ref 8–23)
CO2: 32 mmol/L (ref 22–32)
Calcium: 9.8 mg/dL (ref 8.9–10.3)
Chloride: 105 mmol/L (ref 98–111)
Creatinine: 0.77 mg/dL (ref 0.44–1.00)
GFR, Est AFR Am: >60 mL/min (ref >60)
GFR, Estimated: >60 mL/min (ref >60)
Glucose, Bld: 191 mg/dL — ABNORMAL HIGH (ref 70–99)
Potassium: 4 mmol/L (ref 3.5–5.1)
Sodium: 145 mmol/L (ref 135–145)
Total Bilirubin: 1.2 mg/dL (ref 0.3–1.2)
Total Protein: 6.8 g/dL (ref 6.5–8.1)

## 2019-05-07 LAB — CBC WITH DIFFERENTIAL (CANCER CENTER ONLY)
Abs Immature Granulocytes: 0.01 10*3/uL (ref 0.00–0.07)
Basophils Absolute: 0.1 10*3/uL (ref 0.0–0.1)
Basophils Relative: 2 %
Eosinophils Absolute: 0.2 10*3/uL (ref 0.0–0.5)
Eosinophils Relative: 3 %
HCT: 42.8 % (ref 36.0–46.0)
Hemoglobin: 14.6 g/dL (ref 12.0–15.0)
Immature Granulocytes: 0 %
Lymphocytes Relative: 32 %
Lymphs Abs: 2.1 10*3/uL (ref 0.7–4.0)
MCH: 33.5 pg (ref 26.0–34.0)
MCHC: 34.1 g/dL (ref 30.0–36.0)
MCV: 98.2 fL (ref 80.0–100.0)
Monocytes Absolute: 0.8 10*3/uL (ref 0.1–1.0)
Monocytes Relative: 12 %
Neutro Abs: 3.2 10*3/uL (ref 1.7–7.7)
Neutrophils Relative %: 51 %
Platelet Count: 297 10*3/uL (ref 150–400)
RBC: 4.36 MIL/uL (ref 3.87–5.11)
RDW: 12.4 % (ref 11.5–15.5)
WBC Count: 6.4 10*3/uL (ref 4.0–10.5)
nRBC: 0 % (ref 0.0–0.2)

## 2019-05-07 LAB — HEMOGLOBIN A1C
Hgb A1c MFr Bld: 7.8 % — ABNORMAL HIGH (ref 4.8–5.6)
Mean Plasma Glucose: 177.16 mg/dL

## 2019-05-07 LAB — LACTATE DEHYDROGENASE: LDH: 208 U/L — ABNORMAL HIGH (ref 98–192)

## 2019-05-07 MED ORDER — FLUCONAZOLE 150 MG PO TABS: 150.0000 mg | ORAL_TABLET | Freq: Every day | ORAL | 6 refills | Status: DC

## 2019-05-07 NOTE — Telephone Encounter (Signed)
Appointments scheudled and updated calendar was printed per 1/20 los

## 2019-05-07 NOTE — Progress Notes (Signed)
Hematology and Oncology Follow Up Visit  Laurie Green [id number] 1945-05-01 74 y.o. Green/20/2021   Principle Diagnosis:  Green. Antiphospholipid antibody syndrome. 2. History of recurrent lower extremity deep venous thrombosis. 3. Nonalcoholic steatohepatitis.  Current Therapy:   Coumadin - 7.5 mg po q day -  to maintain an INR between 2-3. -- d/c due to lack of availability  Eliquis 5 mg po BID -- started on 11/21/2018     Interim History:  Laurie Green is back for followup.  She is doing okay.  She enjoys being on Eliquis.  She really thinks that the Eliquis is a lot more convenient for her.  Her blood sugars still are on the high side.  She is doing her best to try to get her blood sugars down..  She does have some gallstones.  I think she says she passes gallstones.  She has had no issues with nausea or vomiting.  She has had no bleeding.  There is been no diarrhea.  There is some issues with her family.  One of her family members is going through a crisis right now.  This is really upset Laurie Green.  As a mom, she is trying to help out as much as possible.  She has had no leg pain.    Overall, her performance status is Laurie Green.  Medications:  Current Outpatient Medications:  .  apixaban (ELIQUIS) 5 MG TABS tablet, Take Green tablet (5 mg total) by mouth 2 (two) times daily., Disp: 60 tablet, Rfl: 6 .  benazepril (LOTENSIN) 20 MG tablet, Take 20 mg by mouth every morning. , Disp: , Rfl:  .  Biotin 10000 MCG TABS, Take 10,000 mcg by mouth daily., Disp: , Rfl:  .  Cholecalciferol (VITAMIN D3) 2000 UNITS capsule, Take 2,000 Units by mouth daily., Disp: , Rfl:  .  Continuous Blood Gluc Receiver (FREESTYLE LIBRE Green DAY READER) DEVI, See admin instructions., Disp: , Rfl: 0 .  Continuous Blood Gluc Sensor (FREESTYLE LIBRE 14 DAY SENSOR) MISC, See admin instructions., Disp: , Rfl: 5 .  glucose 4 GM chewable tablet, Chew 16 g by mouth as needed for low blood sugar., Disp: , Rfl:  .   insulin glargine (LANTUS) 100 UNIT/ML injection, Inject 18 Units into the skin at bedtime. If CBG greater than 120, take 19 units., Disp: , Rfl:  .  NEOMYCIN-POLYMYXIN-HYDROCORTISONE (CORTISPORIN) Green % SOLN OTIC solution, Place 3 drops into the left ear 4 (four) times daily., Disp: 10 mL, Rfl: 2 .  NON FORMULARY, Carbon-60 two tablespoons daily, Disp: , Rfl:  .  NON FORMULARY, Serrapeptase 3 capsules daily, Disp: , Rfl:  .  NON FORMULARY, Curcumin two capsules in AM, Disp: , Rfl:  .  PREMARIN vaginal cream, Place Green Applicatorful vaginally 2 (two) times a week. Monday and Wednesday or Thursday., Disp: 42.5 g, Rfl: 3 .  propranolol (INDERAL) 80 MG tablet, Take 80 mg by mouth every morning. , Disp: , Rfl:  .  propranolol (INNOPRAN XL) 80 MG 24 hr capsule, propranolol ER 80 mg capsule,24 hr,extended release  TAKE Green CAPSULE BY MOUTH EVERY DAY, Disp: , Rfl:  .  traMADol (ULTRAM) 50 MG tablet, , Disp: , Rfl:  .  traZODone (DESYREL) 50 MG tablet, TAKE Green TABLET BY MOUTH EVERYDAY AT BEDTIME, Disp: 90 tablet, Rfl: Green .  vitamin B-12 (CYANOCOBALAMIN) 1000 MCG tablet, Take Green,000 mcg by mouth daily. Take 2 tablets of 1000 mcg, a total of 2000 mcg once a day, per  patient., Disp: , Rfl:  .  fluconazole (DIFLUCAN) 150 MG tablet, Take Green tablet (150 mg total) by mouth daily. Take daily for 3 days for yeast infection., Disp: 20 tablet, Rfl: 6  Allergies:  Allergies  Allergen Reactions  . Caine-Green [Lidocaine Hcl] Anaphylaxis  . Hydrocodone Anaphylaxis  . Lidocaine Hcl Anaphylaxis  . Morphine Anaphylaxis and Other (See Comments)  . Piperacillin Sod-Tazobactam So Itching    "scalp itch"-was given Benadryl to counteract. "scalp itch"-was given Benadryl to counteract. "scalp itch"-was given Benadryl to counteract.  . Codeine Nausea And Vomiting and Other (See Comments)  . Cyclosporine Other (See Comments)    burns burns burns  . Hydrocodone-Acetaminophen Nausea And Vomiting  . Hydromorphone Hcl Nausea And Vomiting     Can take with zofran  . Oxycodone-Acetaminophen Nausea And Vomiting  . Tramadol Nausea And Vomiting  . Dilaudid [Hydromorphone Hcl] Nausea And Vomiting    Can take with zofran  . Morphine And Related Nausea And Vomiting  . Percocet [Oxycodone-Acetaminophen] Nausea And Vomiting  . Vicodin [Hydrocodone-Acetaminophen] Nausea And Vomiting    Past Medical History, Surgical history, Social history, and Family History were reviewed and updated.  Review of Systems: Review of Systems  Constitutional: Positive for malaise/fatigue.  HENT: Negative.   Eyes: Negative.   Respiratory: Negative.   Cardiovascular: Negative.   Gastrointestinal: Positive for abdominal pain and diarrhea.  Genitourinary: Negative.   Musculoskeletal: Positive for myalgias.  Skin: Negative.   Neurological: Negative.   Endo/Heme/Allergies: Negative.   Psychiatric/Behavioral: Negative.      Physical Exam:  weight is 152 lb (68.9 kg). Her temporal temperature is 97.3 F (36.3 C) (abnormal). Her blood pressure is 145/66 (abnormal) and her pulse is 61. Her respiration is 18 and oxygen saturation is 98%.   Physical Exam Vitals reviewed.  HENT:     Head: Normocephalic and atraumatic.  Eyes:     Pupils: Pupils are equal, round, and reactive to light.  Cardiovascular:     Rate and Rhythm: Normal rate and regular rhythm.     Heart sounds: Normal heart sounds.  Pulmonary:     Effort: Pulmonary effort is normal.     Breath sounds: Normal breath sounds.  Abdominal:     General: Bowel sounds are normal.     Palpations: Abdomen is soft.    Musculoskeletal:        General: No tenderness or deformity. Normal range of motion.     Cervical back: Normal range of motion.  Lymphadenopathy:     Cervical: No cervical adenopathy.  Skin:    General: Skin is warm and dry.     Findings: No erythema or rash.  Neurological:     Mental Status: She is alert and oriented to person, place, and time.  Psychiatric:         Behavior: Behavior normal.        Thought Content: Thought content normal.        Judgment: Judgment normal.     Lab Results  Component Value Date   WBC 6.4 05/07/2019   HGB 14.6 05/07/2019   HCT 42.8 05/07/2019   MCV 98.2 05/07/2019   PLT 297 05/07/2019     Chemistry      Component Value Date/Time   NA 145 05/07/2019 1142   NA 144 04/04/2017 1139   NA 140 09/07/2016 0946   K 4.0 05/07/2019 1142   K 3.5 04/04/2017 1139   K 4.Green 09/07/2016 0946   CL 105 05/07/2019 1142  CL 103 04/04/2017 1139   CO2 32 05/07/2019 1142   CO2 33 04/04/2017 1139   CO2 28 09/07/2016 0946   BUN 21 05/07/2019 1142   BUN 15 04/04/2017 1139   BUN 22.6 09/07/2016 0946   CREATININE 0.77 05/07/2019 1142   CREATININE 0.9 04/04/2017 1139   CREATININE 0.8 09/07/2016 0946      Component Value Date/Time   CALCIUM 9.8 05/07/2019 1142   CALCIUM 9.5 04/04/2017 1139   CALCIUM 9.6 09/07/2016 0946   ALKPHOS 78 05/07/2019 1142   ALKPHOS 93 (H) 04/04/2017 1139   ALKPHOS 81 09/07/2016 0946   AST 20 05/07/2019 1142   AST 32 09/07/2016 0946   ALT 19 05/07/2019 1142   ALT 36 04/04/2017 1139   ALT 33 09/07/2016 0946   BILITOT Green.2 05/07/2019 1142   BILITOT Green.34 (H) 09/07/2016 0946       Impression and Plan: Laurie Green is 74 year old white female with the anti-phospholipid antibody syndrome. This really has not been a problem for her. The bigger issue has been the hepatic problems and her diabetes. She has steatosis. She has NASH.  I will plan to have her come back to see [place] in another 6 weeks.  Hopefully, she will be under much less stress when we see her back.  [name], MD Green/20/20211:56 PM

## 2019-06-03 ENCOUNTER — Other Ambulatory Visit: Payer: Self-pay | Admitting: Hematology & Oncology

## 2019-06-03 DIAGNOSIS — D6861 Antiphospholipid syndrome: Secondary | ICD-10-CM

## 2019-06-19 ENCOUNTER — Inpatient Hospital Stay: Payer: PPO

## 2019-06-19 ENCOUNTER — Inpatient Hospital Stay: Payer: PPO | Admitting: Hematology & Oncology

## 2019-07-02 ENCOUNTER — Inpatient Hospital Stay (HOSPITAL_BASED_OUTPATIENT_CLINIC_OR_DEPARTMENT_OTHER): Payer: PPO | Admitting: Hematology & Oncology

## 2019-07-02 ENCOUNTER — Other Ambulatory Visit: Payer: Self-pay

## 2019-07-02 ENCOUNTER — Inpatient Hospital Stay: Payer: PPO | Attending: Hematology & Oncology

## 2019-07-02 ENCOUNTER — Encounter: Payer: Self-pay | Admitting: Hematology & Oncology

## 2019-07-02 ENCOUNTER — Telehealth: Payer: Self-pay | Admitting: Hematology & Oncology

## 2019-07-02 VITALS — BP 145/75 | HR 65 | Temp 97.3°F | Resp 20 | Wt 150.8 lb

## 2019-07-02 DIAGNOSIS — Z79899 Other long term (current) drug therapy: Secondary | ICD-10-CM | POA: Insufficient documentation

## 2019-07-02 DIAGNOSIS — E119 Type 2 diabetes mellitus without complications: Secondary | ICD-10-CM | POA: Insufficient documentation

## 2019-07-02 DIAGNOSIS — Z86718 Personal history of other venous thrombosis and embolism: Secondary | ICD-10-CM | POA: Diagnosis not present

## 2019-07-02 DIAGNOSIS — Z7901 Long term (current) use of anticoagulants: Secondary | ICD-10-CM | POA: Diagnosis not present

## 2019-07-02 DIAGNOSIS — K7581 Nonalcoholic steatohepatitis (NASH): Secondary | ICD-10-CM | POA: Insufficient documentation

## 2019-07-02 DIAGNOSIS — I82401 Acute embolism and thrombosis of unspecified deep veins of right lower extremity: Secondary | ICD-10-CM

## 2019-07-02 DIAGNOSIS — Z794 Long term (current) use of insulin: Secondary | ICD-10-CM | POA: Diagnosis not present

## 2019-07-02 DIAGNOSIS — D6861 Antiphospholipid syndrome: Secondary | ICD-10-CM | POA: Diagnosis not present

## 2019-07-02 DIAGNOSIS — E0800 Diabetes mellitus due to underlying condition with hyperosmolarity without nonketotic hyperglycemic-hyperosmolar coma (NKHHC): Secondary | ICD-10-CM

## 2019-07-02 LAB — CMP (CANCER CENTER ONLY)
ALT: 20 U/L (ref 0–44)
AST: 22 U/L (ref 15–41)
Albumin: 4.3 g/dL (ref 3.5–5.0)
Alkaline Phosphatase: 69 U/L (ref 38–126)
Anion gap: 5 (ref 5–15)
BUN: 23 mg/dL (ref 8–23)
CO2: 32 mmol/L (ref 22–32)
Calcium: 9.5 mg/dL (ref 8.9–10.3)
Chloride: 103 mmol/L (ref 98–111)
Creatinine: 0.78 mg/dL (ref 0.44–1.00)
GFR, Est AFR Am: >60 mL/min (ref >60)
GFR, Estimated: >60 mL/min (ref >60)
Glucose, Bld: 169 mg/dL — ABNORMAL HIGH (ref 70–99)
Potassium: 4.3 mmol/L (ref 3.5–5.1)
Sodium: 140 mmol/L (ref 135–145)
Total Bilirubin: 1.3 mg/dL — ABNORMAL HIGH (ref 0.3–1.2)
Total Protein: 6.7 g/dL (ref 6.5–8.1)

## 2019-07-02 LAB — CBC WITH DIFFERENTIAL (CANCER CENTER ONLY)
Abs Immature Granulocytes: 0.01 10*3/uL (ref 0.00–0.07)
Basophils Absolute: 0.1 10*3/uL (ref 0.0–0.1)
Basophils Relative: 1 %
Eosinophils Absolute: 0.2 10*3/uL (ref 0.0–0.5)
Eosinophils Relative: 3 %
HCT: 44.1 % (ref 36.0–46.0)
Hemoglobin: 15.1 g/dL — ABNORMAL HIGH (ref 12.0–15.0)
Immature Granulocytes: 0 %
Lymphocytes Relative: 28 %
Lymphs Abs: 1.8 10*3/uL (ref 0.7–4.0)
MCH: 33.6 pg (ref 26.0–34.0)
MCHC: 34.2 g/dL (ref 30.0–36.0)
MCV: 98.2 fL (ref 80.0–100.0)
Monocytes Absolute: 0.8 10*3/uL (ref 0.1–1.0)
Monocytes Relative: 12 %
Neutro Abs: 3.7 10*3/uL (ref 1.7–7.7)
Neutrophils Relative %: 56 %
Platelet Count: 304 10*3/uL (ref 150–400)
RBC: 4.49 MIL/uL (ref 3.87–5.11)
RDW: 13 % (ref 11.5–15.5)
WBC Count: 6.5 10*3/uL (ref 4.0–10.5)
nRBC: 0 % (ref 0.0–0.2)

## 2019-07-02 NOTE — Progress Notes (Signed)
Hematology and Oncology Follow Up Visit  Laurie Green [id number] April 30, 1945 74 y.o. 07/02/2019   Principle Diagnosis:  1. Antiphospholipid antibody syndrome. 2. History of recurrent lower extremity deep venous thrombosis. 3. Nonalcoholic steatohepatitis.  Current Therapy:   Coumadin - 7.5 mg po q day -  to maintain an INR between 2-3. -- d/c due to lack of availability  Eliquis 5 mg po BID -- started on 11/21/2018     Interim History:  Ms.  Green is back for followup.  She is still dealing with stress with the family.  She is trying to help one of her daughters.  She is doing okay otherwise.  She does complain of some chest pain.  This is not with exertion.  Just seems to happen.  I told her that she probably needs to have an echocardiogram.  Her last one was 2 years ago.  She is also complained of some right upper quadrant pain.  She does not have a gallbladder.  She has cirrhosis.  We will get a ultrasound of her abdomen to make sure that there is nothing going on with her liver.  Her diabetes still is a problem.  I am not sure what her hemoglobin A1c is.  Her blood sugar this morning when she woke up she said was 114.  She has had no leg swelling.  She has fallen a little bit.  She has little bit of pain and swelling with the right lower leg.  She has had no fever.  She still been very diligent with the coronavirus.  Overall, her performance status is ECOG 1.  Medications:  Current Outpatient Medications:  .  apixaban (ELIQUIS) 5 MG TABS tablet, Take 1 tablet (5 mg total) by mouth 2 (two) times daily., Disp: 60 tablet, Rfl: 6 .  benazepril (LOTENSIN) 20 MG tablet, Take 20 mg by mouth every morning. , Disp: , Rfl:  .  Biotin 10000 MCG TABS, Take 10,000 mcg by mouth daily., Disp: , Rfl:  .  Cholecalciferol (VITAMIN D3) 2000 UNITS capsule, Take 2,000 Units by mouth daily., Disp: , Rfl:  .  Continuous Blood Gluc Receiver (FREESTYLE LIBRE 36 DAY READER) DEVI, See admin  instructions., Disp: , Rfl: 0 .  Continuous Blood Gluc Sensor (FREESTYLE LIBRE 14 DAY SENSOR) MISC, See admin instructions., Disp: , Rfl: 5 .  insulin glargine (LANTUS) 100 UNIT/ML injection, Inject 18 Units into the skin at bedtime. If CBG greater than 120, take 19 units., Disp: , Rfl:  .  LANTUS SOLOSTAR 100 UNIT/ML Solostar Pen, SMARTSIG:18-24 Unit(s) SUB-Q Every Night, Disp: , Rfl:  .  NEOMYCIN-POLYMYXIN-HYDROCORTISONE (CORTISPORIN) 1 % SOLN OTIC solution, Place 3 drops into the left ear 4 (four) times daily., Disp: 10 mL, Rfl: 2 .  NON FORMULARY, Carbon-60 two tablespoons daily, Disp: , Rfl:  .  NON FORMULARY, Serrapeptase 3 capsules daily, Disp: , Rfl:  .  PREMARIN vaginal cream, PLACE 1 APPLICATORFUL VAGINALLY 2 TIMES A WEEK. MONDAY AND WEDNESDAY OR THURSDAY., Disp: 30 g, Rfl: 3 .  propranolol (INNOPRAN XL) 80 MG 24 hr capsule, propranolol ER 80 mg capsule,24 hr,extended release  TAKE 1 CAPSULE BY MOUTH EVERY DAY, Disp: , Rfl:  .  vitamin B-12 (CYANOCOBALAMIN) 1000 MCG tablet, Take 1,000 mcg by mouth daily. Take 2 tablets of 1000 mcg, a total of 2000 mcg once a day, per patient., Disp: , Rfl:  .  fluconazole (DIFLUCAN) 150 MG tablet, Take 1 tablet (150 mg total) by mouth daily. Take daily for  3 days for yeast infection. (Patient not taking: Reported on 07/02/2019), Disp: 20 tablet, Rfl: 6 .  glucose 4 GM chewable tablet, Chew 16 g by mouth as needed for low blood sugar., Disp: , Rfl:   Allergies:  Allergies  Allergen Reactions  . Caine-1 [Lidocaine Hcl] Anaphylaxis  . Hydrocodone Anaphylaxis  . Lidocaine Hcl Anaphylaxis  . Morphine Anaphylaxis and Other (See Comments)  . Piperacillin Sod-Tazobactam So Itching    "scalp itch"-was given Benadryl to counteract. "scalp itch"-was given Benadryl to counteract. "scalp itch"-was given Benadryl to counteract.  . Codeine Nausea And Vomiting and Other (See Comments)  . Cyclosporine Other (See Comments)    burns burns burns  .  Hydrocodone-Acetaminophen Nausea And Vomiting  . Hydromorphone Hcl Nausea And Vomiting    Can take with zofran  . Oxycodone-Acetaminophen Nausea And Vomiting  . Tramadol Nausea And Vomiting  . Dilaudid [Hydromorphone Hcl] Nausea And Vomiting    Can take with zofran  . Morphine And Related Nausea And Vomiting  . Percocet [Oxycodone-Acetaminophen] Nausea And Vomiting  . Vicodin [Hydrocodone-Acetaminophen] Nausea And Vomiting    Past Medical History, Surgical history, Social history, and Family History were reviewed and updated.  Review of Systems: Review of Systems  Constitutional: Positive for malaise/fatigue.  HENT: Negative.   Eyes: Negative.   Respiratory: Negative.   Cardiovascular: Negative.   Gastrointestinal: Positive for abdominal pain and diarrhea.  Genitourinary: Negative.   Musculoskeletal: Positive for myalgias.  Skin: Negative.   Neurological: Negative.   Endo/Heme/Allergies: Negative.   Psychiatric/Behavioral: Negative.      Physical Exam:  weight is 150 lb 12.8 oz (68.4 kg). Her temporal temperature is 97.3 F (36.3 C) (abnormal). Her blood pressure is 145/75 (abnormal) and her pulse is 65. Her respiration is 20 and oxygen saturation is 98%.   Physical Exam Vitals reviewed.  HENT:     Head: Normocephalic and atraumatic.  Eyes:     Pupils: Pupils are equal, round, and reactive to light.  Cardiovascular:     Rate and Rhythm: Normal rate and regular rhythm.     Heart sounds: Normal heart sounds.  Pulmonary:     Effort: Pulmonary effort is normal.     Breath sounds: Normal breath sounds.  Abdominal:     General: Bowel sounds are normal.     Palpations: Abdomen is soft.    Musculoskeletal:        General: No tenderness or deformity. Normal range of motion.     Cervical back: Normal range of motion.  Lymphadenopathy:     Cervical: No cervical adenopathy.  Skin:    General: Skin is warm and dry.     Findings: No erythema or rash.  Neurological:      Mental Status: She is alert and oriented to person, place, and time.  Psychiatric:        Behavior: Behavior normal.        Thought Content: Thought content normal.        Judgment: Judgment normal.     Lab Results  Component Value Date   WBC 6.5 07/02/2019   HGB 15.1 (H) 07/02/2019   HCT 44.1 07/02/2019   MCV 98.2 07/02/2019   PLT 304 07/02/2019     Chemistry      Component Value Date/Time   NA 140 07/02/2019 1113   NA 144 04/04/2017 1139   NA 140 09/07/2016 0946   K 4.3 07/02/2019 1113   K 3.5 04/04/2017 1139   K 4.1 09/07/2016 0946  CL 103 07/02/2019 1113   CL 103 04/04/2017 1139   CO2 32 07/02/2019 1113   CO2 33 04/04/2017 1139   CO2 28 09/07/2016 0946   BUN 23 07/02/2019 1113   BUN 15 04/04/2017 1139   BUN 22.6 09/07/2016 0946   CREATININE 0.78 07/02/2019 1113   CREATININE 0.9 04/04/2017 1139   CREATININE 0.8 09/07/2016 0946      Component Value Date/Time   CALCIUM 9.5 07/02/2019 1113   CALCIUM 9.5 04/04/2017 1139   CALCIUM 9.6 09/07/2016 0946   ALKPHOS 69 07/02/2019 1113   ALKPHOS 93 (H) 04/04/2017 1139   ALKPHOS 81 09/07/2016 0946   AST 22 07/02/2019 1113   AST 32 09/07/2016 0946   ALT 20 07/02/2019 1113   ALT 36 04/04/2017 1139   ALT 33 09/07/2016 0946   BILITOT 1.3 (H) 07/02/2019 1113   BILITOT 1.34 (H) 09/07/2016 0946       Impression and Plan: Ms. Jurek is 74 year old white female with the anti-phospholipid antibody syndrome. This really has not been a problem for her. The bigger issue has been the hepatic problems and her diabetes. She has steatosis. She has NASH.  We will see about the echocardiogram.  We will also see about a ultrasound of her abdomen.  I will plan to have her come back to see [place] in another 6 weeks.  Hopefully, she will be under much less stress when we see her back.  [name], MD 3/17/202112:14 PM

## 2019-07-02 NOTE — Telephone Encounter (Signed)
Appointments scheduled calendar printed per 3/17 los 

## 2019-07-09 ENCOUNTER — Other Ambulatory Visit: Payer: Self-pay

## 2019-07-09 ENCOUNTER — Ambulatory Visit (HOSPITAL_BASED_OUTPATIENT_CLINIC_OR_DEPARTMENT_OTHER)
Admission: RE | Admit: 2019-07-09 | Discharge: 2019-07-09 | Disposition: A | Discharge status: Home / Self Care | Payer: PPO | Source: Ambulatory Visit | Attending: Hematology & Oncology | Admitting: Hematology & Oncology

## 2019-07-09 DIAGNOSIS — D6861 Antiphospholipid syndrome: Secondary | ICD-10-CM | POA: Diagnosis not present

## 2019-07-09 DIAGNOSIS — K7460 Unspecified cirrhosis of liver: Secondary | ICD-10-CM | POA: Diagnosis not present

## 2019-07-09 DIAGNOSIS — K7581 Nonalcoholic steatohepatitis (NASH): Secondary | ICD-10-CM | POA: Diagnosis not present

## 2019-07-10 ENCOUNTER — Telehealth: Payer: Self-pay | Admitting: *Deleted

## 2019-07-10 NOTE — Telephone Encounter (Signed)
Pt notified per order of Dr. Marin Olp that there is "no cancer seen on the U/S."  Pt appreciative of call and has no questions or concerns at this time.

## 2019-07-10 NOTE — Telephone Encounter (Signed)
-----   Message from [name], MD sent at 07/09/2019  4:46 PM EDT ----- Call - no cancer seen on the U/S!!  [name]

## 2019-07-15 ENCOUNTER — Other Ambulatory Visit: Payer: Self-pay | Admitting: Hematology & Oncology

## 2019-07-21 ENCOUNTER — Other Ambulatory Visit (HOSPITAL_COMMUNITY): Payer: PPO

## 2019-08-05 ENCOUNTER — Other Ambulatory Visit: Payer: Self-pay

## 2019-08-05 ENCOUNTER — Ambulatory Visit (HOSPITAL_COMMUNITY): Payer: PPO | Attending: Cardiovascular Disease

## 2019-08-05 DIAGNOSIS — D6861 Antiphospholipid syndrome: Secondary | ICD-10-CM | POA: Insufficient documentation

## 2019-08-05 DIAGNOSIS — E119 Type 2 diabetes mellitus without complications: Secondary | ICD-10-CM | POA: Diagnosis not present

## 2019-08-05 DIAGNOSIS — I082 Rheumatic disorders of both aortic and tricuspid valves: Secondary | ICD-10-CM | POA: Diagnosis not present

## 2019-08-05 DIAGNOSIS — I491 Atrial premature depolarization: Secondary | ICD-10-CM | POA: Diagnosis not present

## 2019-08-05 DIAGNOSIS — R079 Chest pain, unspecified: Secondary | ICD-10-CM

## 2019-08-08 ENCOUNTER — Telehealth: Payer: Self-pay | Admitting: *Deleted

## 2019-08-08 NOTE — Telephone Encounter (Signed)
-----   Message from [name], MD sent at 08/08/2019  9:29 AM EDT ----- Call - the echo shows that your heart is pumping normally and there are NO valve issues!!!  [name]

## 2019-08-08 NOTE — Telephone Encounter (Signed)
Pt notified per order of Dr. Marin Olp that "the echo shows that your heart is pumping normally and there are NO valve issues!!!"  Pt appreciative of call and has no questions or concerns at this time.

## 2019-08-13 ENCOUNTER — Inpatient Hospital Stay: Payer: PPO | Attending: Hematology & Oncology

## 2019-08-13 ENCOUNTER — Inpatient Hospital Stay (HOSPITAL_BASED_OUTPATIENT_CLINIC_OR_DEPARTMENT_OTHER): Payer: PPO | Admitting: Hematology & Oncology

## 2019-08-13 ENCOUNTER — Other Ambulatory Visit: Payer: Self-pay

## 2019-08-13 ENCOUNTER — Encounter: Payer: Self-pay | Admitting: Hematology & Oncology

## 2019-08-13 VITALS — BP 149/81 | HR 70 | Temp 98.0°F | Resp 16 | Wt 151.0 lb

## 2019-08-13 DIAGNOSIS — Z7901 Long term (current) use of anticoagulants: Secondary | ICD-10-CM | POA: Diagnosis not present

## 2019-08-13 DIAGNOSIS — E119 Type 2 diabetes mellitus without complications: Secondary | ICD-10-CM | POA: Insufficient documentation

## 2019-08-13 DIAGNOSIS — Z794 Long term (current) use of insulin: Secondary | ICD-10-CM | POA: Diagnosis not present

## 2019-08-13 DIAGNOSIS — K7581 Nonalcoholic steatohepatitis (NASH): Secondary | ICD-10-CM

## 2019-08-13 DIAGNOSIS — Z79899 Other long term (current) drug therapy: Secondary | ICD-10-CM | POA: Diagnosis not present

## 2019-08-13 DIAGNOSIS — K760 Fatty (change of) liver, not elsewhere classified: Secondary | ICD-10-CM

## 2019-08-13 DIAGNOSIS — D6861 Antiphospholipid syndrome: Secondary | ICD-10-CM | POA: Insufficient documentation

## 2019-08-13 DIAGNOSIS — Z86718 Personal history of other venous thrombosis and embolism: Secondary | ICD-10-CM | POA: Insufficient documentation

## 2019-08-13 DIAGNOSIS — K7460 Unspecified cirrhosis of liver: Secondary | ICD-10-CM | POA: Diagnosis not present

## 2019-08-13 LAB — CMP (CANCER CENTER ONLY)
ALT: 22 U/L (ref 0–44)
AST: 24 U/L (ref 15–41)
Albumin: 4.2 g/dL (ref 3.5–5.0)
Alkaline Phosphatase: 76 U/L (ref 38–126)
Anion gap: 9 (ref 5–15)
BUN: 20 mg/dL (ref 8–23)
CO2: 32 mmol/L (ref 22–32)
Calcium: 9.9 mg/dL (ref 8.9–10.3)
Chloride: 103 mmol/L (ref 98–111)
Creatinine: 0.83 mg/dL (ref 0.44–1.00)
GFR, Est AFR Am: >60 mL/min (ref >60)
GFR, Estimated: >60 mL/min (ref >60)
Glucose, Bld: 214 mg/dL — ABNORMAL HIGH (ref 70–99)
Potassium: 4.1 mmol/L (ref 3.5–5.1)
Sodium: 144 mmol/L (ref 135–145)
Total Bilirubin: 1.7 mg/dL — ABNORMAL HIGH (ref 0.3–1.2)
Total Protein: 6.9 g/dL (ref 6.5–8.1)

## 2019-08-13 LAB — CBC WITH DIFFERENTIAL (CANCER CENTER ONLY)
Abs Immature Granulocytes: 0.02 10*3/uL (ref 0.00–0.07)
Basophils Absolute: 0.1 10*3/uL (ref 0.0–0.1)
Basophils Relative: 1 %
Eosinophils Absolute: 0.2 10*3/uL (ref 0.0–0.5)
Eosinophils Relative: 2 %
HCT: 43.1 % (ref 36.0–46.0)
Hemoglobin: 14.9 g/dL (ref 12.0–15.0)
Immature Granulocytes: 0 %
Lymphocytes Relative: 21 %
Lymphs Abs: 1.8 10*3/uL (ref 0.7–4.0)
MCH: 33.6 pg (ref 26.0–34.0)
MCHC: 34.6 g/dL (ref 30.0–36.0)
MCV: 97.1 fL (ref 80.0–100.0)
Monocytes Absolute: 0.7 10*3/uL (ref 0.1–1.0)
Monocytes Relative: 9 %
Neutro Abs: 5.9 10*3/uL (ref 1.7–7.7)
Neutrophils Relative %: 67 %
Platelet Count: 294 10*3/uL (ref 150–400)
RBC: 4.44 MIL/uL (ref 3.87–5.11)
RDW: 12.6 % (ref 11.5–15.5)
WBC Count: 8.7 10*3/uL (ref 4.0–10.5)
nRBC: 0 % (ref 0.0–0.2)

## 2019-08-14 ENCOUNTER — Telehealth: Payer: Self-pay | Admitting: Hematology & Oncology

## 2019-08-14 LAB — LACTATE DEHYDROGENASE: LDH: 215 U/L — ABNORMAL HIGH (ref 98–192)

## 2019-08-14 NOTE — Telephone Encounter (Signed)
No los 4/28

## 2019-08-15 ENCOUNTER — Ambulatory Visit: Payer: PPO | Admitting: Hematology & Oncology

## 2019-08-15 ENCOUNTER — Other Ambulatory Visit: Payer: PPO

## 2019-08-18 NOTE — Progress Notes (Signed)
Hematology and Oncology Follow Up Visit  Laurie Green [id number] [birth year] 74 y.o. 08/18/2019   Principle Diagnosis:  1. Antiphospholipid antibody syndrome. 2. History of recurrent lower extremity deep venous thrombosis. 3. Nonalcoholic steatohepatitis.  Current Therapy:   Coumadin - 7.5 mg po q day -  to maintain an INR between 2-3. -- d/c due to lack of availability  Eliquis 5 mg po BID -- started on 11/21/2018     Interim History:  Ms.  Green is back for followup.  Overall, she seems to be holding her own.  There is still a lot of problems that she is dealing with with respect to a daughter of hers.  I did state that her daughter is going through some of this.  Laurie Green is having some abdominal discomfort.  She was always worried about developing cancer.  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  As such, we probably should follow up with another MRI.  She has a pancreatic cyst.  This was noted on MRI that was done a year ago.  She is doing well on the Eliquis.  She is not having any problems with bleeding.  There is no obvious change in bowel or bladder habits.  There has been no problems with leg swelling.  Blood sugars just not under good control.  Blood sugar today is 214.  Hopefully, her family doctor will be able to get this under better control.  Of note, she had echocardiogram done on 08/05/2019.  This showed a good ejection fraction of 60-65%.  Everything else looked pretty good.  There is no obvious valvular issues.  She has had no problems with fever.  She has had no headache.  Hopefully, she will be able to go to the beach with her family.  The have a home down there that she likes to go to.  Overall, I would say her performance status is ECOG 1.    Medications:  Current Outpatient Medications:  .  benazepril (LOTENSIN) 20 MG tablet, Take 20 mg by mouth every morning. , Disp: , Rfl:  .  Biotin 10000 MCG TABS, Take 10,000 mcg by mouth daily., Disp: , Rfl:  .   Cholecalciferol (VITAMIN D3) 2000 UNITS capsule, Take 2,000 Units by mouth daily., Disp: , Rfl:  .  Continuous Blood Gluc Receiver (FREESTYLE LIBRE 40 DAY READER) DEVI, See admin instructions., Disp: , Rfl: 0 .  Continuous Blood Gluc Sensor (FREESTYLE LIBRE 14 DAY SENSOR) MISC, See admin instructions., Disp: , Rfl: 5 .  ELIQUIS 5 MG TABS tablet, TAKE 1 TABLET BY MOUTH TWICE A DAY, Disp: 60 tablet, Rfl: 6 .  fluconazole (DIFLUCAN) 150 MG tablet, Take 1 tablet (150 mg total) by mouth daily. Take daily for 3 days for yeast infection. (Patient not taking: Reported on 07/02/2019), Disp: 20 tablet, Rfl: 6 .  glucose 4 GM chewable tablet, Chew 16 g by mouth as needed for low blood sugar., Disp: , Rfl:  .  insulin glargine (LANTUS) 100 UNIT/ML injection, Inject 18 Units into the skin at bedtime. If CBG greater than 120, take 19 units., Disp: , Rfl:  .  LANTUS SOLOSTAR 100 UNIT/ML Solostar Pen, SMARTSIG:18-24 Unit(s) SUB-Q Every Night, Disp: , Rfl:  .  NEOMYCIN-POLYMYXIN-HYDROCORTISONE (CORTISPORIN) 1 % SOLN OTIC solution, Place 3 drops into the left ear 4 (four) times daily., Disp: 10 mL, Rfl: 2 .  NON FORMULARY, Carbon-60 two tablespoons daily, Disp: , Rfl:  .  NON FORMULARY, Serrapeptase 3 capsules daily, Disp: , Rfl:  .  PREMARIN vaginal cream, PLACE 1 APPLICATORFUL VAGINALLY 2 TIMES A WEEK. MONDAY AND WEDNESDAY OR THURSDAY., Disp: 30 g, Rfl: 3 .  propranolol (INNOPRAN XL) 80 MG 24 hr capsule, propranolol ER 80 mg capsule,24 hr,extended release  TAKE 1 CAPSULE BY MOUTH EVERY DAY, Disp: , Rfl:  .  vitamin B-12 (CYANOCOBALAMIN) 1000 MCG tablet, Take 1,000 mcg by mouth daily. Take 2 tablets of 1000 mcg, a total of 2000 mcg once a day, per patient., Disp: , Rfl:   Allergies:  Allergies  Allergen Reactions  . Caine-1 [Lidocaine Hcl] Anaphylaxis  . Hydrocodone Anaphylaxis  . Lidocaine Hcl Anaphylaxis  . Morphine Anaphylaxis and Other (See Comments)  . Piperacillin Sod-Tazobactam So Itching    "scalp  itch"-was given Benadryl to counteract. "scalp itch"-was given Benadryl to counteract. "scalp itch"-was given Benadryl to counteract.  . Codeine Nausea And Vomiting and Other (See Comments)  . Cyclosporine Other (See Comments)    burns burns burns  . Hydrocodone-Acetaminophen Nausea And Vomiting  . Hydromorphone Hcl Nausea And Vomiting    Can take with zofran  . Oxycodone-Acetaminophen Nausea And Vomiting  . Tramadol Nausea And Vomiting  . Dilaudid [Hydromorphone Hcl] Nausea And Vomiting    Can take with zofran  . Morphine And Related Nausea And Vomiting  . Percocet [Oxycodone-Acetaminophen] Nausea And Vomiting  . Vicodin [Hydrocodone-Acetaminophen] Nausea And Vomiting    Past Medical History, Surgical history, Social history, and Family History were reviewed and updated.  Review of Systems: Review of Systems  Constitutional: Positive for malaise/fatigue.  HENT: Negative.   Eyes: Negative.   Respiratory: Negative.   Cardiovascular: Negative.   Gastrointestinal: Positive for abdominal pain and diarrhea.  Genitourinary: Negative.   Musculoskeletal: Positive for myalgias.  Skin: Negative.   Neurological: Negative.   Endo/Heme/Allergies: Negative.   Psychiatric/Behavioral: Negative.      Physical Exam:  weight is 151 lb (68.5 kg). Her temporal temperature is 98 F (36.7 C). Her blood pressure is 149/81 (abnormal) and her pulse is 70. Her respiration is 16 and oxygen saturation is 98%.   Physical Exam Vitals reviewed.  HENT:     Head: Normocephalic and atraumatic.  Eyes:     Pupils: Pupils are equal, round, and reactive to light.  Cardiovascular:     Rate and Rhythm: Normal rate and regular rhythm.     Heart sounds: Normal heart sounds.  Pulmonary:     Effort: Pulmonary effort is normal.     Breath sounds: Normal breath sounds.  Abdominal:     General: Bowel sounds are normal.     Palpations: Abdomen is soft.    Musculoskeletal:        General: No tenderness or  deformity. Normal range of motion.     Cervical back: Normal range of motion.  Lymphadenopathy:     Cervical: No cervical adenopathy.  Skin:    General: Skin is warm and dry.     Findings: No erythema or rash.  Neurological:     Mental Status: She is alert and oriented to person, place, and time.  Psychiatric:        Behavior: Behavior normal.        Thought Content: Thought content normal.        Judgment: Judgment normal.     Lab Results  Component Value Date   WBC 8.7 08/13/2019   HGB 14.9 08/13/2019   HCT 43.1 08/13/2019   MCV 97.1 08/13/2019   PLT 294 08/13/2019     Chemistry  Component Value Date/Time   NA 144 08/13/2019 1350   NA 144 04/04/2017 1139   NA 140 09/07/2016 0946   K 4.1 08/13/2019 1350   K 3.5 04/04/2017 1139   K 4.1 09/07/2016 0946   CL 103 08/13/2019 1350   CL 103 04/04/2017 1139   CO2 32 08/13/2019 1350   CO2 33 04/04/2017 1139   CO2 28 09/07/2016 0946   BUN 20 08/13/2019 1350   BUN 15 04/04/2017 1139   BUN 22.6 09/07/2016 0946   CREATININE 0.83 08/13/2019 1350   CREATININE 0.9 04/04/2017 1139   CREATININE 0.8 09/07/2016 0946      Component Value Date/Time   CALCIUM 9.9 08/13/2019 1350   CALCIUM 9.5 04/04/2017 1139   CALCIUM 9.6 09/07/2016 0946   ALKPHOS 76 08/13/2019 1350   ALKPHOS 93 (H) 04/04/2017 1139   ALKPHOS 81 09/07/2016 0946   AST 24 08/13/2019 1350   AST 32 09/07/2016 0946   ALT 22 08/13/2019 1350   ALT 36 04/04/2017 1139   ALT 33 09/07/2016 0946   BILITOT 1.7 (H) 08/13/2019 1350   BILITOT 1.34 (H) 09/07/2016 0946       Impression and Plan: Laurie Green is 74 year old white female with the anti-phospholipid antibody syndrome. This really has not been a problem for her. The bigger issue has been the hepatic problems and her diabetes. She has steatosis. She has NASH.  I will set an MRI for her.  This will let us know how things are going with respect to her liver and pancreas.  This was gives her peace of mind.  We  will plan to get her back to see [place] in another couple months.  [name], MD 5/3/20211:51 PM

## 2019-08-19 DIAGNOSIS — J209 Acute bronchitis, unspecified: Secondary | ICD-10-CM | POA: Diagnosis not present

## 2019-08-20 DIAGNOSIS — R05 Cough: Secondary | ICD-10-CM | POA: Diagnosis not present

## 2019-08-20 DIAGNOSIS — Z1152 Encounter for screening for COVID-19: Secondary | ICD-10-CM | POA: Diagnosis not present

## 2019-09-02 DIAGNOSIS — I10 Essential (primary) hypertension: Secondary | ICD-10-CM | POA: Diagnosis not present

## 2019-09-02 DIAGNOSIS — K766 Portal hypertension: Secondary | ICD-10-CM | POA: Diagnosis not present

## 2019-09-02 DIAGNOSIS — D6861 Antiphospholipid syndrome: Secondary | ICD-10-CM | POA: Diagnosis not present

## 2019-09-02 DIAGNOSIS — E119 Type 2 diabetes mellitus without complications: Secondary | ICD-10-CM | POA: Diagnosis not present

## 2019-09-02 DIAGNOSIS — Z0000 Encounter for general adult medical examination without abnormal findings: Secondary | ICD-10-CM | POA: Diagnosis not present

## 2019-09-02 DIAGNOSIS — R1013 Epigastric pain: Secondary | ICD-10-CM | POA: Diagnosis not present

## 2019-09-02 DIAGNOSIS — Z794 Long term (current) use of insulin: Secondary | ICD-10-CM | POA: Diagnosis not present

## 2019-09-02 DIAGNOSIS — K7469 Other cirrhosis of liver: Secondary | ICD-10-CM | POA: Diagnosis not present

## 2019-09-02 DIAGNOSIS — E782 Mixed hyperlipidemia: Secondary | ICD-10-CM | POA: Diagnosis not present

## 2019-09-02 DIAGNOSIS — I8500 Esophageal varices without bleeding: Secondary | ICD-10-CM | POA: Diagnosis not present

## 2019-09-17 ENCOUNTER — Ambulatory Visit
Admission: RE | Admit: 2019-09-17 | Discharge: 2019-09-17 | Disposition: A | Discharge status: Home / Self Care | Payer: PPO | Source: Ambulatory Visit | Attending: Hematology & Oncology | Admitting: Hematology & Oncology

## 2019-09-17 ENCOUNTER — Other Ambulatory Visit: Payer: Self-pay

## 2019-09-17 DIAGNOSIS — K760 Fatty (change of) liver, not elsewhere classified: Secondary | ICD-10-CM

## 2019-09-18 ENCOUNTER — Ambulatory Visit
Admission: RE | Admit: 2019-09-18 | Discharge: 2019-09-18 | Disposition: A | Discharge status: Home / Self Care | Payer: PPO | Source: Ambulatory Visit | Attending: Hematology & Oncology | Admitting: Hematology & Oncology

## 2019-09-18 DIAGNOSIS — K7460 Unspecified cirrhosis of liver: Secondary | ICD-10-CM | POA: Diagnosis not present

## 2019-09-18 MED ORDER — GADOBENATE DIMEGLUMINE 529 MG/ML IV SOLN
14.0000 mL | Freq: Once | INTRAVENOUS | Status: AC | PRN
  Administered 2019-09-18: 14 mL via INTRAVENOUS

## 2019-09-19 ENCOUNTER — Telehealth: Payer: Self-pay | Admitting: *Deleted

## 2019-09-19 NOTE — Telephone Encounter (Signed)
-----   Message from [name], MD sent at 09/18/2019  5:23 PM EDT ----- Please call and let her know that the CT scan does not show anything that is changed.  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  She has the liver irregularities that are consistent with cirrhosis.  There is nothing that suggest cancer.  Thank you.  [name]

## 2019-09-19 NOTE — Telephone Encounter (Signed)
As noted below by Dr. Marin Olp, I informed the patient that the CT scan does not show anything has changed. The cyst in the pancreas has not grown at all. You still have liver irregularities that are consistent with cirrhosis. There is nothing that suggest cancer. She verbalized understanding.

## 2019-11-26 DIAGNOSIS — R52 Pain, unspecified: Secondary | ICD-10-CM | POA: Diagnosis not present

## 2019-11-26 DIAGNOSIS — W57XXXA Bitten or stung by nonvenomous insect and other nonvenomous arthropods, initial encounter: Secondary | ICD-10-CM | POA: Diagnosis not present

## 2019-12-11 DIAGNOSIS — M545 Low back pain: Secondary | ICD-10-CM | POA: Diagnosis not present

## 2019-12-23 DIAGNOSIS — M5126 Other intervertebral disc displacement, lumbar region: Secondary | ICD-10-CM | POA: Diagnosis not present

## 2019-12-23 DIAGNOSIS — M419 Scoliosis, unspecified: Secondary | ICD-10-CM | POA: Diagnosis not present

## 2019-12-23 DIAGNOSIS — M47816 Spondylosis without myelopathy or radiculopathy, lumbar region: Secondary | ICD-10-CM | POA: Diagnosis not present

## 2019-12-23 DIAGNOSIS — M48061 Spinal stenosis, lumbar region without neurogenic claudication: Secondary | ICD-10-CM | POA: Diagnosis not present

## 2020-01-11 ENCOUNTER — Emergency Department (HOSPITAL_BASED_OUTPATIENT_CLINIC_OR_DEPARTMENT_OTHER): Payer: PPO

## 2020-01-11 ENCOUNTER — Emergency Department (HOSPITAL_BASED_OUTPATIENT_CLINIC_OR_DEPARTMENT_OTHER)
Admission: EM | Admit: 2020-01-11 | Discharge: 2020-01-11 | Disposition: A | Discharge status: Home / Self Care | Payer: PPO | Attending: Emergency Medicine | Admitting: Emergency Medicine

## 2020-01-11 ENCOUNTER — Other Ambulatory Visit: Payer: Self-pay

## 2020-01-11 ENCOUNTER — Encounter (HOSPITAL_BASED_OUTPATIENT_CLINIC_OR_DEPARTMENT_OTHER): Payer: Self-pay | Admitting: Emergency Medicine

## 2020-01-11 DIAGNOSIS — R0602 Shortness of breath: Secondary | ICD-10-CM | POA: Diagnosis not present

## 2020-01-11 DIAGNOSIS — Z79899 Other long term (current) drug therapy: Secondary | ICD-10-CM | POA: Diagnosis not present

## 2020-01-11 DIAGNOSIS — Z794 Long term (current) use of insulin: Secondary | ICD-10-CM | POA: Diagnosis not present

## 2020-01-11 DIAGNOSIS — Z7901 Long term (current) use of anticoagulants: Secondary | ICD-10-CM | POA: Insufficient documentation

## 2020-01-11 DIAGNOSIS — I10 Essential (primary) hypertension: Secondary | ICD-10-CM | POA: Diagnosis not present

## 2020-01-11 DIAGNOSIS — J45909 Unspecified asthma, uncomplicated: Secondary | ICD-10-CM | POA: Diagnosis not present

## 2020-01-11 DIAGNOSIS — J069 Acute upper respiratory infection, unspecified: Secondary | ICD-10-CM | POA: Diagnosis not present

## 2020-01-11 DIAGNOSIS — J9811 Atelectasis: Secondary | ICD-10-CM | POA: Diagnosis not present

## 2020-01-11 DIAGNOSIS — E119 Type 2 diabetes mellitus without complications: Secondary | ICD-10-CM | POA: Insufficient documentation

## 2020-01-11 LAB — CBC
HCT: 41.2 % (ref 36.0–46.0)
Hemoglobin: 14.4 g/dL (ref 12.0–15.0)
MCH: 33.5 pg (ref 26.0–34.0)
MCHC: 35 g/dL (ref 30.0–36.0)
MCV: 95.8 fL (ref 80.0–100.0)
Platelets: 293 10*3/uL (ref 150–400)
RBC: 4.3 MIL/uL (ref 3.87–5.11)
RDW: 12.5 % (ref 11.5–15.5)
WBC: 6 10*3/uL (ref 4.0–10.5)
nRBC: 0 % (ref 0.0–0.2)

## 2020-01-11 LAB — BASIC METABOLIC PANEL
Anion gap: 11 (ref 5–15)
BUN: 20 mg/dL (ref 8–23)
CO2: 24 mmol/L (ref 22–32)
Calcium: 9.1 mg/dL (ref 8.9–10.3)
Chloride: 103 mmol/L (ref 98–111)
Creatinine, Ser: 0.58 mg/dL (ref 0.44–1.00)
GFR calc Af Amer: >60 mL/min (ref >60)
GFR calc non Af Amer: >60 mL/min (ref >60)
Glucose, Bld: 180 mg/dL — ABNORMAL HIGH (ref 70–99)
Potassium: 4.1 mmol/L (ref 3.5–5.1)
Sodium: 138 mmol/L (ref 135–145)

## 2020-01-11 LAB — TROPONIN I (HIGH SENSITIVITY): Troponin I (High Sensitivity): 5 ng/L (ref <18)

## 2020-01-11 NOTE — Discharge Instructions (Addendum)
Your x-ray did not show pneumonia.  Your blood work was otherwise reassuring.  You have refused Covid test.  Follow-up with your primary care doctor.

## 2020-01-11 NOTE — ED Triage Notes (Signed)
Reports she had pneumonia in April and [place] spotted fever a month ago.  Reports she feels SOB with thick clear productive cough.  Feels like she may have pneumonia.

## 2020-01-11 NOTE — ED Provider Notes (Signed)
[place] EMERGENCY DEPARTMENT Provider Note   CSN: [id number] Arrival date & time: 01/11/20  1628     History Chief Complaint  Patient presents with  . Shortness of Breath  . Cough    Laurie Green is a 74 y.o. female.  HPI Patient presents with cough.  States she feels little short of breath with it.  States that in April she had pneumonia and around a month ago diagnosed with [facility] spotted fever.  States she had an x-ray in April and had negative Covid test when she was diagnosed with [facility] spotted fever a month ago.  States she still feels if she has a cough.  States she is bringing up some sputum.  Has not had her Covid vaccine.  No swelling in her legs.    Past Medical History:  Diagnosis Date  . 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 Antiphospholipid antibody with hypercoagulable state ([place]) 11/07/2012  . Arthritis   . Asthma    well controlled, no rescue inhaler used  . Blood dyscrasia    anticardiolipid syndrome  . Blood transfusion    age 26 yr old-none since  . Celiac disease   . Claustrophobia   . Diabetes mellitus    Type II  . Diverticulitis   . DVT (deep venous thrombosis) ([place]) 05/01/2011   "to liver"  . Fibromyalgia   . GERD (gastroesophageal reflux disease)    11/01/16- not problem  . H/O seasonal allergies   . Heart murmur    "not a problem"  . Hypertension   . NASH (nonalcoholic steatohepatitis) 05/01/2011  . Nausea & vomiting    11-17-14 "nausea and vomiting after meals" at present discussed with Dr. Oletta Lamas office per pt.  . Pneumonia    in 6 grade- "double"  . PONV (postoperative nausea and vomiting)    'I dont have it if they give me something in the IV."  . Portal hypertension ([place])   . 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 TIA (transient ischemic attack)   . Varices 05/01/2011  . Varices, esophageal ([place])   . Varices, gastric     Patient Active Problem List   Diagnosis Date  Noted  . Antiphospholipid antibody with hypercoagulable state ([place]) 11/07/2012  . Transaminitis 09/07/2012  . Fever, unspecified 09/07/2012  . Abdominal pain 09/04/2012  . Subtherapeutic international normalized ratio (INR) 09/04/2012  . Diabetes mellitus ([place]) 09/04/2012  . Leukocytosis, unspecified 09/04/2012  . Varices, esophageal ([place]) 09/04/2012  . Transaminasemia 09/04/2012  . DVT (deep venous thrombosis) ([place]) 05/01/2011  . Varices 05/01/2011  . NASH (nonalcoholic steatohepatitis) 05/01/2011  . Incisional hernia without mention of obstruction or gangrene 02/14/2011    Past Surgical History:  Procedure Laterality Date  . ABDOMINAL HYSTERECTOMY    . ANKLE FRACTURE SURGERY Left    retained hardware  . CHOLECYSTECTOMY    . COLON SURGERY     hx. diverticulitis  . COLONOSCOPY N/A 11/23/2014   Procedure: COLONOSCOPY;  Surgeon: [name], MD;  Location: [facility] ENDOSCOPY;  Service: Endoscopy;  Laterality: N/A;  . ESOPHAGOGASTRODUODENOSCOPY  04/28/2011   Procedure: ESOPHAGOGASTRODUODENOSCOPY (EGD);  Surgeon: [name]., MD;  Location: [facility] ENDOSCOPY;  Service: Endoscopy;  Laterality: N/A;  . ESOPHAGOGASTRODUODENOSCOPY N/A 11/23/2014   Procedure: ESOPHAGOGASTRODUODENOSCOPY (EGD);  Surgeon: [name], MD;  Location: [facility] ENDOSCOPY;  Service: Endoscopy;  Laterality: N/A;  . ESOPHAGOGASTRODUODENOSCOPY (EGD) WITH PROPOFOL N/A 11/06/2016   Procedure: ESOPHAGOGASTRODUODENOSCOPY (EGD) WITH  PROPOFOL;  Surgeon: [name], MD;  Location: [place];  Service: Endoscopy;  Laterality: N/A;  . EUS N/A 09/25/2012   Procedure: ESOPHAGEAL ENDOSCOPIC ULTRASOUND (EUS) RADIAL;  Surgeon: Arta Silence, MD;  Location: [facility] ENDOSCOPY;  Service: Endoscopy;  Laterality: N/A;  . HERNIA REPAIR     luq incisional  . SPLENECTOMY, TOTAL    . TONSILLECTOMY       OB History   No obstetric history on file.     No family history on file.  Social History   Tobacco Use  . Smoking status: Never  Smoker  . Smokeless tobacco: Never Used  . Tobacco comment: never used tobacco  Vaping Use  . Vaping Use: Never used  Substance Use Topics  . Alcohol use: No    Alcohol/week: 0.0 standard drinks  . Drug use: No    Home Medications Prior to Admission medications   Medication Sig Start Date End Date Taking? Authorizing Provider  benazepril (LOTENSIN) 20 MG tablet Take 20 mg by mouth every morning.     [provider]  Biotin 10000 MCG TABS Take 10,000 mcg by mouth daily.    [provider]  Cholecalciferol (VITAMIN D3) 2000 UNITS capsule Take 2,000 Units by mouth daily.    [provider]  Continuous Blood Gluc Receiver (FREESTYLE LIBRE 14 DAY READER) [place] See admin instructions. 08/08/17   [provider]  Continuous Blood Gluc Sensor (FREESTYLE LIBRE 14 DAY SENSOR) [place] See admin instructions. 08/08/17   [provider]  ELIQUIS 5 MG TABS tablet TAKE 1 TABLET BY MOUTH TWICE A DAY 07/15/19   [name], MD  fluconazole (DIFLUCAN) 150 MG tablet Take 1 tablet (150 mg total) by mouth daily. Take daily for 3 days for yeast infection. Patient not taking: Reported on 07/02/2019 05/07/19   [name], MD  glucose 4 GM chewable tablet Chew 16 g by mouth as needed for low blood sugar.    [provider]  insulin glargine (LANTUS) 100 UNIT/ML injection Inject 18 Units into the skin at bedtime. If CBG greater than 120, take 19 units.    [provider]  LANTUS SOLOSTAR 100 UNIT/ML Solostar Pen SMARTSIG:18-24 Unit(s) SUB-Q Every Night 04/21/19   [provider]  NEOMYCIN-POLYMYXIN-HYDROCORTISONE (CORTISPORIN) 1 % SOLN OTIC solution Place 3 drops into the left ear 4 (four) times daily. 08/15/17   [name], MD  NON FORMULARY Carbon-60 two tablespoons daily    [provider]  NON FORMULARY Serrapeptase 3 capsules daily    [provider]  PREMARIN vaginal cream PLACE 1 APPLICATORFUL VAGINALLY 2 TIMES A  WEEK. MONDAY AND WEDNESDAY OR THURSDAY. 06/03/19   [name], MD  propranolol (INNOPRAN XL) 80 MG 24 hr capsule propranolol ER 80 mg capsule,24 hr,extended release  TAKE 1 CAPSULE BY MOUTH EVERY DAY    [provider]  vitamin B-12 (CYANOCOBALAMIN) 1000 MCG tablet Take 1,000 mcg by mouth daily. Take 2 tablets of 1000 mcg, a total of 2000 mcg once a day, per patient.    [provider]    Allergies    Caine-1 [lidocaine hcl], Hydrocodone, Lidocaine hcl, Morphine, Piperacillin sod-tazobactam so, Codeine, Cyclosporine, Hydrocodone-acetaminophen, Hydromorphone hcl, Oxycodone-acetaminophen, Tramadol, Dilaudid [hydromorphone hcl], Morphine and related, Percocet [oxycodone-acetaminophen], and Vicodin [hydrocodone-acetaminophen]  Review of Systems   Review of Systems  Constitutional: Positive for fatigue. Negative for appetite change and fever.  HENT: Positive for congestion.   Respiratory: Positive for cough and shortness of breath.  Gastrointestinal: Negative for abdominal pain.  Genitourinary: Negative for flank pain.  Musculoskeletal: Negative for back pain.  Neurological: Negative for weakness.  Psychiatric/Behavioral: Negative for confusion.    Physical Exam Updated Vital Signs BP (!) 167/84 (BP Location: Left Arm)   Pulse 64   Temp 98.6 F (37 C) (Oral)   Resp 16   Ht 5' 3.5" (1.613 m)   Wt 64.9 kg   SpO2 98%   BMI 24.93 kg/m   Physical Exam Vitals and nursing note reviewed.  HENT:     Head: Normocephalic.  Cardiovascular:     Rate and Rhythm: Normal rate and regular rhythm.  Pulmonary:     Breath sounds: Normal breath sounds. No wheezing or rhonchi.  Chest:     Chest wall: No tenderness.  Musculoskeletal:     Right lower leg: No edema.     Left lower leg: No edema.  Skin:    General: Skin is warm.     Capillary Refill: Capillary refill takes less than 2 seconds.  Neurological:     Mental Status: She is alert and oriented to person, place,  and time.     ED Results / Procedures / Treatments   Labs (all labs ordered are listed, but only abnormal results are displayed) Labs Reviewed  BASIC METABOLIC PANEL - Abnormal; Notable for the following components:      Result Value   Glucose, Bld 180 (*)    All other components within normal limits  CBC  TROPONIN I (HIGH SENSITIVITY)  TROPONIN I (HIGH SENSITIVITY)    EKG EKG Interpretation  Date/Time:  Sunday January 11 2020 16:37:57 EDT Ventricular Rate:  71 PR Interval:  144 QRS Duration: 78 QT Interval:  410 QTC Calculation: 445 R Axis:   46 Text Interpretation: Normal sinus rhythm Low voltage QRS Cannot rule out Anterior infarct , age undetermined Abnormal ECG Confirmed by Davonna Belling [phone]) on 01/11/2020 6:35:52 PM   Radiology DG Chest 2 View  Result Date: 01/11/2020 CLINICAL DATA:  Shortness of breath and chest pain.  Cough. EXAM: CHEST - 2 VIEW COMPARISON:  Radiograph 09/14/2018 FINDINGS: The cardiomediastinal contours are normal. Unchanged subsegmental scarring in the lingula. There is minimal subsegmental opacity at the right lung base that is new from prior. Pulmonary vasculature is normal. No confluent consolidation, pleural effusion, or pneumothorax. No acute osseous abnormalities are seen. Surgical clips in the right and left upper quadrant of the abdomen. IMPRESSION: Minimal subsegmental right lung base opacity, typical of atelectasis. Minimal lingular scarring is unchanged. Electronically Signed   By: Keith Rake M.D.   On: 01/11/2020 17:16    Procedures Procedures (including critical care time)  Medications Ordered in ED Medications - No data to display  ED Course  I have reviewed the triage vital signs and the nursing notes.  Pertinent labs & imaging results that were available during my care of the patient were reviewed by me and considered in my medical decision making (see chart for details).    MDM Rules/Calculators/A&P                           Patient with URI type symptoms.  States has had some since pneumonia and then also felt bad with [facility] spotted fever.  Had been on doxycycline for the [facility] spotted fever and on a different antibiotic for the pneumonia.  X-ray does not show pneumonia and lungs are clear.  Has not had her  Covid vaccine and states that we will not give it to her here.  Patient also refused Covid testing because she states that there is something on the swab that is carcinogen and a pesticide.  Not hypoxic.  Doubt pulmonary embolism.  EKG reassuring.  I think stable for outpatient follow-up. Final Clinical Impression(s) / ED Diagnoses Final diagnoses:  Upper respiratory tract infection, unspecified type    Rx / DC Orders ED Discharge Orders    None       Davonna Belling, MD 01/11/20 1840

## 2020-03-03 DIAGNOSIS — R5382 Chronic fatigue, unspecified: Secondary | ICD-10-CM | POA: Diagnosis not present

## 2020-03-03 DIAGNOSIS — E119 Type 2 diabetes mellitus without complications: Secondary | ICD-10-CM | POA: Diagnosis not present

## 2020-03-03 DIAGNOSIS — R0602 Shortness of breath: Secondary | ICD-10-CM | POA: Diagnosis not present

## 2020-03-04 ENCOUNTER — Other Ambulatory Visit: Payer: Self-pay | Admitting: Family Medicine

## 2020-03-04 ENCOUNTER — Ambulatory Visit
Admission: RE | Admit: 2020-03-04 | Discharge: 2020-03-04 | Disposition: A | Discharge status: Home / Self Care | Payer: PPO | Source: Ambulatory Visit | Attending: Family Medicine | Admitting: Family Medicine

## 2020-03-04 DIAGNOSIS — R0602 Shortness of breath: Secondary | ICD-10-CM

## 2020-03-05 DIAGNOSIS — R059 Cough, unspecified: Secondary | ICD-10-CM | POA: Diagnosis not present

## 2020-03-07 ENCOUNTER — Other Ambulatory Visit: Payer: Self-pay | Admitting: Hematology & Oncology

## 2020-03-22 DIAGNOSIS — M25561 Pain in right knee: Secondary | ICD-10-CM | POA: Diagnosis not present

## 2020-03-30 DIAGNOSIS — M25561 Pain in right knee: Secondary | ICD-10-CM | POA: Diagnosis not present

## 2020-03-30 DIAGNOSIS — M545 Low back pain, unspecified: Secondary | ICD-10-CM | POA: Diagnosis not present

## 2020-04-20 DIAGNOSIS — M25561 Pain in right knee: Secondary | ICD-10-CM | POA: Diagnosis not present

## 2020-04-20 DIAGNOSIS — M545 Low back pain, unspecified: Secondary | ICD-10-CM | POA: Diagnosis not present

## 2020-04-21 DIAGNOSIS — M25561 Pain in right knee: Secondary | ICD-10-CM | POA: Diagnosis not present

## 2020-04-21 DIAGNOSIS — M545 Low back pain, unspecified: Secondary | ICD-10-CM | POA: Diagnosis not present

## 2020-04-23 DIAGNOSIS — Z1231 Encounter for screening mammogram for malignant neoplasm of breast: Secondary | ICD-10-CM | POA: Diagnosis not present

## 2020-04-26 DIAGNOSIS — M25561 Pain in right knee: Secondary | ICD-10-CM | POA: Diagnosis not present

## 2020-04-26 DIAGNOSIS — M545 Low back pain, unspecified: Secondary | ICD-10-CM | POA: Diagnosis not present

## 2020-04-29 DIAGNOSIS — M1711 Unilateral primary osteoarthritis, right knee: Secondary | ICD-10-CM | POA: Diagnosis not present

## 2020-04-29 DIAGNOSIS — M25561 Pain in right knee: Secondary | ICD-10-CM | POA: Diagnosis not present

## 2020-05-01 DIAGNOSIS — M25561 Pain in right knee: Secondary | ICD-10-CM | POA: Diagnosis not present

## 2020-05-02 ENCOUNTER — Encounter (HOSPITAL_BASED_OUTPATIENT_CLINIC_OR_DEPARTMENT_OTHER): Payer: Self-pay

## 2020-05-02 ENCOUNTER — Other Ambulatory Visit: Payer: Self-pay

## 2020-05-02 ENCOUNTER — Emergency Department (HOSPITAL_BASED_OUTPATIENT_CLINIC_OR_DEPARTMENT_OTHER)
Admission: EM | Admit: 2020-05-02 | Discharge: 2020-05-02 | Disposition: A | Discharge status: Home / Self Care | Payer: PPO | Attending: Emergency Medicine | Admitting: Emergency Medicine

## 2020-05-02 DIAGNOSIS — I10 Essential (primary) hypertension: Secondary | ICD-10-CM | POA: Diagnosis not present

## 2020-05-02 DIAGNOSIS — J45909 Unspecified asthma, uncomplicated: Secondary | ICD-10-CM | POA: Diagnosis not present

## 2020-05-02 DIAGNOSIS — Z79899 Other long term (current) drug therapy: Secondary | ICD-10-CM | POA: Insufficient documentation

## 2020-05-02 DIAGNOSIS — R21 Rash and other nonspecific skin eruption: Secondary | ICD-10-CM | POA: Diagnosis not present

## 2020-05-02 DIAGNOSIS — Z7901 Long term (current) use of anticoagulants: Secondary | ICD-10-CM | POA: Diagnosis not present

## 2020-05-02 DIAGNOSIS — Z794 Long term (current) use of insulin: Secondary | ICD-10-CM | POA: Insufficient documentation

## 2020-05-02 DIAGNOSIS — E119 Type 2 diabetes mellitus without complications: Secondary | ICD-10-CM | POA: Insufficient documentation

## 2020-05-02 DIAGNOSIS — L509 Urticaria, unspecified: Secondary | ICD-10-CM | POA: Insufficient documentation

## 2020-05-02 MED ORDER — CETIRIZINE HCL 10 MG PO TABS: 10.0000 mg | ORAL_TABLET | Freq: Every day | ORAL | 0 refills | Status: DC

## 2020-05-02 MED ORDER — FAMOTIDINE 20 MG PO TABS: 20.0000 mg | ORAL_TABLET | Freq: Two times a day (BID) | ORAL | 0 refills | Status: DC

## 2020-05-02 MED ORDER — HYDROXYZINE HCL 25 MG PO TABS: 25.0000 mg | ORAL_TABLET | Freq: Four times a day (QID) | ORAL | 0 refills | Status: DC | PRN

## 2020-05-02 MED ORDER — TRIAMCINOLONE ACETONIDE 0.1 % EX CREA: 1.0000 "application " | TOPICAL_CREAM | Freq: Two times a day (BID) | CUTANEOUS | 0 refills | Status: DC

## 2020-05-02 NOTE — Discharge Instructions (Addendum)
Apply Triamcinolone cream as prescribed. Take Hydroxyzine as needed as prescribed for itching. You can cut this tablet in half if it makes you too sleepy. Take Zyrtec daily. Take Pepcid as prescribed.  Follow up with your doctor if itching/rash persist.   Do NOT take Benadryl while taking Hydroxyzine.

## 2020-05-02 NOTE — ED Triage Notes (Signed)
Pt presents with a rash that she describes as hives x 3 days. Pt states the itching is too bad and is unrelieved by benadryl.

## 2020-05-02 NOTE — ED Provider Notes (Signed)
[place] EMERGENCY DEPARTMENT Provider Note   CSN: [id number] Arrival date & time: 05/02/20  1436     History Chief Complaint  Patient presents with  . Rash    Laurie Green is a 75 y.o. female.  75 year old female with complaint of itching/rash x 3 days, onset on her face, now spread to trunk. No relief with benadryl and hot showers. No new medications/lotions/soaps/detergents. No respiratory complaints, no other concerns. History of diabetes, states the last time she had a steroid injection her blood sugar was elevated at 500 (A1C typically 7).         Past Medical History:  Diagnosis Date  . 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 Antiphospholipid antibody with hypercoagulable state ([place]) 11/07/2012  . Arthritis   . Asthma    well controlled, no rescue inhaler used  . Blood dyscrasia    anticardiolipid syndrome  . Blood transfusion    age 37 yr old-none since  . Celiac disease   . Claustrophobia   . Diabetes mellitus    Type II  . Diverticulitis   . DVT (deep venous thrombosis) ([place]) 05/01/2011   "to liver"  . Fibromyalgia   . GERD (gastroesophageal reflux disease)    11/01/16- not problem  . H/O seasonal allergies   . Heart murmur    "not a problem"  . Hypertension   . NASH (nonalcoholic steatohepatitis) 05/01/2011  . Nausea & vomiting    11-17-14 "nausea and vomiting after meals" at present discussed with Dr. Oletta Lamas office per pt.  . Pneumonia    in 6 grade- "double"  . PONV (postoperative nausea and vomiting)    'I dont have it if they give me something in the IV."  . Portal hypertension ([place])   . 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 TIA (transient ischemic attack)   . Varices 05/01/2011  . Varices, esophageal ([place])   . Varices, gastric     Patient Active Problem List   Diagnosis Date Noted  . Antiphospholipid antibody with hypercoagulable state ([place]) 11/07/2012  . Transaminitis 09/07/2012  . Fever,  unspecified 09/07/2012  . Abdominal pain 09/04/2012  . Subtherapeutic international normalized ratio (INR) 09/04/2012  . Diabetes mellitus ([place]) 09/04/2012  . Leukocytosis, unspecified 09/04/2012  . Varices, esophageal ([place]) 09/04/2012  . Transaminasemia 09/04/2012  . DVT (deep venous thrombosis) ([place]) 05/01/2011  . Varices 05/01/2011  . NASH (nonalcoholic steatohepatitis) 05/01/2011  . Incisional hernia without mention of obstruction or gangrene 02/14/2011    Past Surgical History:  Procedure Laterality Date  . ABDOMINAL HYSTERECTOMY    . ANKLE FRACTURE SURGERY Left    retained hardware  . CHOLECYSTECTOMY    . COLON SURGERY     hx. diverticulitis  . COLONOSCOPY N/A 11/23/2014   Procedure: COLONOSCOPY;  Surgeon: [name], MD;  Location: [facility] ENDOSCOPY;  Service: Endoscopy;  Laterality: N/A;  . ESOPHAGOGASTRODUODENOSCOPY  04/28/2011   Procedure: ESOPHAGOGASTRODUODENOSCOPY (EGD);  Surgeon: [name]., MD;  Location: [facility] ENDOSCOPY;  Service: Endoscopy;  Laterality: N/A;  . ESOPHAGOGASTRODUODENOSCOPY N/A 11/23/2014   Procedure: ESOPHAGOGASTRODUODENOSCOPY (EGD);  Surgeon: [name], MD;  Location: [facility] ENDOSCOPY;  Service: Endoscopy;  Laterality: N/A;  . ESOPHAGOGASTRODUODENOSCOPY (EGD) WITH PROPOFOL N/A 11/06/2016   Procedure: ESOPHAGOGASTRODUODENOSCOPY (EGD) WITH PROPOFOL;  Surgeon: [name], MD;  Location: [place];  Service: Endoscopy;  Laterality: N/A;  . EUS N/A 09/25/2012   Procedure: ESOPHAGEAL ENDOSCOPIC ULTRASOUND (EUS) RADIAL;  Surgeon: Arta Silence,  MD;  Location: [facility] ENDOSCOPY;  Service: Endoscopy;  Laterality: N/A;  . HERNIA REPAIR     luq incisional  . SPLENECTOMY, TOTAL    . TONSILLECTOMY       OB History   No obstetric history on file.     No family history on file.  Social History   Tobacco Use  . Smoking status: Never Smoker  . Smokeless tobacco: Never Used  . Tobacco comment: never used tobacco  Vaping Use  . Vaping Use: Never used   Substance Use Topics  . Alcohol use: No    Alcohol/week: 0.0 standard drinks  . Drug use: No    Home Medications Prior to Admission medications   Medication Sig Start Date End Date Taking? Authorizing Provider  cetirizine (ZYRTEC ALLERGY) 10 MG tablet Take 1 tablet (10 mg total) by mouth daily. 05/02/20 06/01/20 Yes Tacy Learn, PA-C  famotidine (PEPCID) 20 MG tablet Take 1 tablet (20 mg total) by mouth 2 (two) times daily. 05/02/20  Yes Tacy Learn, PA-C  hydrOXYzine (ATARAX/VISTARIL) 25 MG tablet Take 1 tablet (25 mg total) by mouth every 6 (six) hours as needed for itching. 05/02/20  Yes Tacy Learn, PA-C  triamcinolone (KENALOG) 0.1 % Apply 1 application topically 2 (two) times daily. 05/02/20  Yes Tacy Learn, PA-C  benazepril (LOTENSIN) 20 MG tablet Take 20 mg by mouth every morning.     [provider]  Biotin 10000 MCG TABS Take 10,000 mcg by mouth daily.    [provider]  Cholecalciferol (VITAMIN D3) 2000 UNITS capsule Take 2,000 Units by mouth daily.    [provider]  Continuous Blood Gluc Receiver (FREESTYLE LIBRE 14 DAY READER) [place] See admin instructions. 08/08/17   [provider]  Continuous Blood Gluc Sensor (FREESTYLE LIBRE 14 DAY SENSOR) MISC See admin instructions. 08/08/17   [provider]  ELIQUIS 5 MG TABS tablet TAKE 1 TABLET BY MOUTH TWICE A DAY 03/08/20   [name], MD  fluconazole (DIFLUCAN) 150 MG tablet Take 1 tablet (150 mg total) by mouth daily. Take daily for 3 days for yeast infection. Patient not taking: Reported on 07/02/2019 05/07/19   [name], MD  glucose 4 GM chewable tablet Chew 16 g by mouth as needed for low blood sugar.    [provider]  insulin glargine (LANTUS) 100 UNIT/ML injection Inject 18 Units into the skin at bedtime. If CBG greater than 120, take 19 units.    [provider]  LANTUS SOLOSTAR 100 UNIT/ML Solostar Pen SMARTSIG:18-24 Unit(s) SUB-Q Every  Night 04/21/19   [provider]  NEOMYCIN-POLYMYXIN-HYDROCORTISONE (CORTISPORIN) 1 % SOLN OTIC solution Place 3 drops into the left ear 4 (four) times daily. 08/15/17   [name], MD  NON FORMULARY Carbon-60 two tablespoons daily    [provider]  NON FORMULARY Serrapeptase 3 capsules daily    [provider]  PREMARIN vaginal cream PLACE 1 APPLICATORFUL VAGINALLY 2 TIMES A WEEK. MONDAY AND WEDNESDAY OR THURSDAY. 06/03/19   [name], MD  propranolol (INNOPRAN XL) 80 MG 24 hr capsule propranolol ER 80 mg capsule,24 hr,extended release  TAKE 1 CAPSULE BY MOUTH EVERY DAY    [provider]  vitamin B-12 (CYANOCOBALAMIN) 1000 MCG tablet Take 1,000 mcg by mouth daily. Take 2 tablets of 1000 mcg, a total of 2000 mcg once a day, per patient.    [provider]    Allergies  Caine-1 [lidocaine hcl], Hydrocodone, Lidocaine hcl, Morphine, Piperacillin sod-tazobactam so, Codeine, Cyclosporine, Hydrocodone-acetaminophen, Hydromorphone hcl, Oxycodone-acetaminophen, Tramadol, Dilaudid [hydromorphone hcl], Morphine and related, Percocet [oxycodone-acetaminophen], and Vicodin [hydrocodone-acetaminophen]  Review of Systems   Review of Systems  Constitutional: Negative for fever.  HENT: Negative for trouble swallowing and voice change.   Respiratory: Negative for shortness of breath.   Cardiovascular: Negative for chest pain.  Gastrointestinal: Negative for nausea and vomiting.  Musculoskeletal: Negative for joint swelling.  Skin: Positive for rash.  Allergic/Immunologic: Positive for immunocompromised state.  All other systems reviewed and are negative.   Physical Exam Updated Vital Signs BP (!) 171/83 (BP Location: Left Arm)   Pulse 78   Temp 98 F (36.7 C) (Oral)   Resp 18   Ht 5' 4"  (1.626 m)   Wt 62.6 kg   SpO2 97%   BMI 23.69 kg/m   Physical Exam Vitals and nursing note reviewed.  Constitutional:      General: She is not in  acute distress.    Appearance: She is well-developed and well-nourished. She is not diaphoretic.  HENT:     Head: Normocephalic and atraumatic.     Mouth/Throat:     Mouth: Mucous membranes are moist.  Cardiovascular:     Rate and Rhythm: Normal rate and regular rhythm.     Heart sounds: Normal heart sounds.  Pulmonary:     Effort: Pulmonary effort is normal.     Breath sounds: Normal breath sounds.  Skin:    General: Skin is warm and dry.     Findings: Rash present.     Comments: Urticaria to trunk  Neurological:     Mental Status: She is alert and oriented to person, place, and time.  Psychiatric:        Mood and Affect: Mood and affect normal.        Behavior: Behavior normal.     ED Results / Procedures / Treatments   Labs (all labs ordered are listed, but only abnormal results are displayed) Labs Reviewed - No data to display  EKG None  Radiology No results found.  Procedures Procedures (including critical care time)  Medications Ordered in ED Medications - No data to display  ED Course  I have reviewed the triage vital signs and the nursing notes.  Pertinent labs & imaging results that were available during my care of the patient were reviewed by me and considered in my medical decision making (see chart for details).  Clinical Course as of 05/02/20 1516  Sun May 02, 1772  6560 75 year old female with urticarial rash to trunk, no known or identifiable cause. Plan was to treat with prednisone however due to DM and hyperglycemia with IM steroids, plan is to try hydroxyzine, zyrtec, pepcid and triamcinolone cream. Follow up with PCP, return to ER for worsening or concerning symptoms.  [LM]    Clinical Course User Index [LM] Roque Lias   MDM Rules/Calculators/A&P                          Final Clinical Impression(s) / ED Diagnoses Final diagnoses:  Rash    Rx / DC Orders ED Discharge Orders         Ordered    hydrOXYzine (ATARAX/VISTARIL)  25 MG tablet  Every 6 hours PRN        05/02/20 1510    famotidine (PEPCID) 20 MG tablet  2 times daily  05/02/20 1510    cetirizine (ZYRTEC ALLERGY) 10 MG tablet  Daily        05/02/20 1510    triamcinolone (KENALOG) 0.1 %  2 times daily        05/02/20 1510           Tacy Learn, PA-C 05/02/20 1516    Lucrezia Starch, MD 05/05/20 1745

## 2020-05-06 ENCOUNTER — Telehealth: Payer: Self-pay

## 2020-05-06 DIAGNOSIS — M25552 Pain in left hip: Secondary | ICD-10-CM | POA: Diagnosis not present

## 2020-05-06 DIAGNOSIS — M25561 Pain in right knee: Secondary | ICD-10-CM | POA: Diagnosis not present

## 2020-05-06 DIAGNOSIS — M25562 Pain in left knee: Secondary | ICD-10-CM | POA: Diagnosis not present

## 2020-05-06 NOTE — Telephone Encounter (Signed)
Pt called and left a vm x 2 to make an appt with dr Marin Olp, I called pt back and left her a vm to call.    Laurie Green

## 2020-05-07 DIAGNOSIS — L501 Idiopathic urticaria: Secondary | ICD-10-CM | POA: Diagnosis not present

## 2020-05-17 DIAGNOSIS — Z6825 Body mass index (BMI) 25.0-25.9, adult: Secondary | ICD-10-CM | POA: Diagnosis not present

## 2020-05-17 DIAGNOSIS — Z8673 Personal history of transient ischemic attack (TIA), and cerebral infarction without residual deficits: Secondary | ICD-10-CM | POA: Diagnosis not present

## 2020-05-17 DIAGNOSIS — E1065 Type 1 diabetes mellitus with hyperglycemia: Secondary | ICD-10-CM | POA: Diagnosis not present

## 2020-05-17 DIAGNOSIS — Z794 Long term (current) use of insulin: Secondary | ICD-10-CM | POA: Diagnosis not present

## 2020-05-28 DIAGNOSIS — E1065 Type 1 diabetes mellitus with hyperglycemia: Secondary | ICD-10-CM | POA: Diagnosis not present

## 2020-06-01 DIAGNOSIS — Z8673 Personal history of transient ischemic attack (TIA), and cerebral infarction without residual deficits: Secondary | ICD-10-CM | POA: Diagnosis not present

## 2020-06-01 DIAGNOSIS — Z794 Long term (current) use of insulin: Secondary | ICD-10-CM | POA: Diagnosis not present

## 2020-06-01 DIAGNOSIS — Z6825 Body mass index (BMI) 25.0-25.9, adult: Secondary | ICD-10-CM | POA: Diagnosis not present

## 2020-06-01 DIAGNOSIS — E1065 Type 1 diabetes mellitus with hyperglycemia: Secondary | ICD-10-CM | POA: Diagnosis not present

## 2020-06-03 ENCOUNTER — Inpatient Hospital Stay: Payer: PPO | Attending: Family

## 2020-06-03 ENCOUNTER — Encounter: Payer: Self-pay | Admitting: Family

## 2020-06-03 ENCOUNTER — Telehealth: Payer: Self-pay

## 2020-06-03 ENCOUNTER — Inpatient Hospital Stay (HOSPITAL_BASED_OUTPATIENT_CLINIC_OR_DEPARTMENT_OTHER): Payer: PPO | Admitting: Family

## 2020-06-03 ENCOUNTER — Other Ambulatory Visit: Payer: Self-pay

## 2020-06-03 VITALS — BP 138/67 | HR 60 | Temp 98.3°F | Resp 17 | Ht 64.0 in | Wt 142.0 lb

## 2020-06-03 DIAGNOSIS — D6861 Antiphospholipid syndrome: Secondary | ICD-10-CM | POA: Insufficient documentation

## 2020-06-03 DIAGNOSIS — I82402 Acute embolism and thrombosis of unspecified deep veins of left lower extremity: Secondary | ICD-10-CM | POA: Diagnosis not present

## 2020-06-03 DIAGNOSIS — K7581 Nonalcoholic steatohepatitis (NASH): Secondary | ICD-10-CM | POA: Insufficient documentation

## 2020-06-03 DIAGNOSIS — Z7901 Long term (current) use of anticoagulants: Secondary | ICD-10-CM | POA: Insufficient documentation

## 2020-06-03 DIAGNOSIS — Z79899 Other long term (current) drug therapy: Secondary | ICD-10-CM | POA: Insufficient documentation

## 2020-06-03 DIAGNOSIS — Z8616 Personal history of COVID-19: Secondary | ICD-10-CM | POA: Diagnosis not present

## 2020-06-03 DIAGNOSIS — I82401 Acute embolism and thrombosis of unspecified deep veins of right lower extremity: Secondary | ICD-10-CM | POA: Diagnosis not present

## 2020-06-03 DIAGNOSIS — Z86718 Personal history of other venous thrombosis and embolism: Secondary | ICD-10-CM | POA: Diagnosis not present

## 2020-06-03 DIAGNOSIS — K760 Fatty (change of) liver, not elsewhere classified: Secondary | ICD-10-CM

## 2020-06-03 LAB — CBC WITH DIFFERENTIAL (CANCER CENTER ONLY)
Abs Immature Granulocytes: 0.02 10*3/uL (ref 0.00–0.07)
Basophils Absolute: 0.1 10*3/uL (ref 0.0–0.1)
Basophils Relative: 1 %
Eosinophils Absolute: 0.6 10*3/uL — ABNORMAL HIGH (ref 0.0–0.5)
Eosinophils Relative: 7 %
HCT: 42.1 % (ref 36.0–46.0)
Hemoglobin: 14.9 g/dL (ref 12.0–15.0)
Immature Granulocytes: 0 %
Lymphocytes Relative: 19 %
Lymphs Abs: 1.6 10*3/uL (ref 0.7–4.0)
MCH: 34.6 pg — ABNORMAL HIGH (ref 26.0–34.0)
MCHC: 35.4 g/dL (ref 30.0–36.0)
MCV: 97.7 fL (ref 80.0–100.0)
Monocytes Absolute: 0.8 10*3/uL (ref 0.1–1.0)
Monocytes Relative: 10 %
Neutro Abs: 5.4 10*3/uL (ref 1.7–7.7)
Neutrophils Relative %: 63 %
Platelet Count: 290 10*3/uL (ref 150–400)
RBC: 4.31 MIL/uL (ref 3.87–5.11)
RDW: 12.4 % (ref 11.5–15.5)
WBC Count: 8.5 10*3/uL (ref 4.0–10.5)
nRBC: 0 % (ref 0.0–0.2)

## 2020-06-03 LAB — CMP (CANCER CENTER ONLY)
ALT: 18 U/L (ref 0–44)
AST: 19 U/L (ref 15–41)
Albumin: 4.2 g/dL (ref 3.5–5.0)
Alkaline Phosphatase: 66 U/L (ref 38–126)
Anion gap: 6 (ref 5–15)
BUN: 23 mg/dL (ref 8–23)
CO2: 33 mmol/L — ABNORMAL HIGH (ref 22–32)
Calcium: 10.3 mg/dL (ref 8.9–10.3)
Chloride: 105 mmol/L (ref 98–111)
Creatinine: 0.77 mg/dL (ref 0.44–1.00)
GFR, Estimated: >60 mL/min (ref >60)
Glucose, Bld: 160 mg/dL — ABNORMAL HIGH (ref 70–99)
Potassium: 4.8 mmol/L (ref 3.5–5.1)
Sodium: 144 mmol/L (ref 135–145)
Total Bilirubin: 1.3 mg/dL — ABNORMAL HIGH (ref 0.3–1.2)
Total Protein: 6.7 g/dL (ref 6.5–8.1)

## 2020-06-03 LAB — LACTATE DEHYDROGENASE: LDH: 192 U/L (ref 98–192)

## 2020-06-03 NOTE — Progress Notes (Signed)
Hematology and Oncology Follow Up Visit  MAIREAD SCHWARZKOPF [id number] 1946/01/14 75 y.o. 06/03/2020   Principle Diagnosis:  Antiphospholipid antibody syndrome. History of recurrent lower extremity deep venous thrombosis. Nonalcoholic steatohepatitis.  Past Therapy: Coumadin - 7.5 mg po q day -  to maintain an INR between 2-3. -- d/c due to lack of availability  Current Therapy:        Eliquis 5 mg po BID -- started on 11/21/2018   Interim History:  Ms. Esquivias is here today for follow-up. She was last here in April 2021. After her last visit she was diagnosed with [facility] Spotted Fever and then right after that she developed Covid pneumonia. She states that she [place] while [place] the beach and tore her right meniscus and is now experiencing bone and bone which is quite painful. She is in a knee brace for added support and is waiting to schedule her knee replacement. We got a letter for surgical clearance from dr. Susa Day and from our standpoint she is ok to proceed with surgery. She is instructed to stop her Eliquis 2 days prior to the procedure and then she can restart the day after. She verbalized understanding. Forms were filled out and faxed to dr. Bernadette Hoit office.  She denies fever, chills, cough, rash, dizziness, SOB, chest pain, palpitations, abdominal pain or changes in bowel or bladder habits.  She has long history of chronic nausea.  She notes numbness and tingling in her feet which she states is unchanged.  No new falls, no syncope.  She denies any blood loss. No abnormal bruising, no petechiae.  She states that she does not have an appetite but is staying well hydrated. Her weight is stable at 142 lbs.   ECOG Performance Status: 1 - Symptomatic but completely ambulatory  Medications:  Allergies as of 06/03/2020      Reactions   Caine-1 [lidocaine Hcl] Anaphylaxis   Hydrocodone Anaphylaxis   Lidocaine Hcl Anaphylaxis   Morphine Anaphylaxis, Other (See Comments)    Piperacillin Sod-tazobactam So Itching   "scalp itch"-was given Benadryl to counteract. "scalp itch"-was given Benadryl to counteract. "scalp itch"-was given Benadryl to counteract.   Codeine Nausea And Vomiting, Other (See Comments)   Cyclosporine Other (See Comments)   burns burns burns   Hydrocodone-acetaminophen Nausea And Vomiting   Hydromorphone Hcl Nausea And Vomiting   Can take with zofran   Oxycodone-acetaminophen Nausea And Vomiting   Tramadol Nausea And Vomiting   Dilaudid [hydromorphone Hcl] Nausea And Vomiting   Can take with zofran   Morphine And Related Nausea And Vomiting   Percocet [oxycodone-acetaminophen] Nausea And Vomiting   Vicodin [hydrocodone-acetaminophen] Nausea And Vomiting      Medication List       Accurate as of June 03, 2020  9:46 AM. If you have any questions, ask your nurse or doctor.        benazepril 20 MG tablet Commonly known as: LOTENSIN Take 20 mg by mouth every morning.   Biotin 10000 MCG Tabs Take 10,000 mcg by mouth daily.   cetirizine 10 MG tablet Commonly known as: ZyrTEC Allergy Take 1 tablet (10 mg total) by mouth daily.   Eliquis 5 MG Tabs tablet Generic drug: apixaban TAKE 1 TABLET BY MOUTH TWICE A DAY   famotidine 20 MG tablet Commonly known as: PEPCID Take 1 tablet (20 mg total) by mouth 2 (two) times daily.   fluconazole 150 MG tablet Commonly known as: DIFLUCAN Take 1 tablet (150 mg total) by  mouth daily. Take daily for 3 days for yeast infection.   FreeStyle Libre 14 Day Reader Kerrin Mo See admin instructions.   FreeStyle [employer] 14 Day Sensor Misc See admin instructions.   glucose 4 GM chewable tablet Chew 16 g by mouth as needed for low blood sugar.   hydrOXYzine 25 MG tablet Commonly known as: ATARAX/VISTARIL Take 1 tablet (25 mg total) by mouth every 6 (six) hours as needed for itching.   insulin glargine 100 UNIT/ML injection Commonly known as: LANTUS Inject 18 Units into the skin at bedtime. If  CBG greater than 120, take 19 units.   Lantus SoloStar 100 UNIT/ML Solostar Pen Generic drug: insulin glargine SMARTSIG:18-24 Unit(s) SUB-Q Every Night   NEOMYCIN-POLYMYXIN-HYDROCORTISONE 1 % Soln OTIC solution Commonly known as: CORTISPORIN Place 3 drops into the left ear 4 (four) times daily.   NON FORMULARY Carbon-60 two tablespoons daily   NON FORMULARY Serrapeptase 3 capsules daily   Premarin vaginal cream Generic drug: conjugated estrogens PLACE 1 APPLICATORFUL VAGINALLY 2 TIMES A WEEK. MONDAY AND WEDNESDAY OR THURSDAY.   propranolol 80 MG 24 hr capsule Commonly known as: INNOPRAN XL propranolol ER 80 mg capsule,24 hr,extended release  TAKE 1 CAPSULE BY MOUTH EVERY DAY   triamcinolone 0.1 % Commonly known as: KENALOG Apply 1 application topically 2 (two) times daily.   vitamin B-12 1000 MCG tablet Commonly known as: CYANOCOBALAMIN Take 1,000 mcg by mouth daily. Take 2 tablets of 1000 mcg, a total of 2000 mcg once a day, per patient.   Vitamin D3 50 MCG (2000 UT) capsule Take 2,000 Units by mouth daily.       Allergies:  Allergies  Allergen Reactions  . Caine-1 [Lidocaine Hcl] Anaphylaxis  . Hydrocodone Anaphylaxis  . Lidocaine Hcl Anaphylaxis  . Morphine Anaphylaxis and Other (See Comments)  . Piperacillin Sod-Tazobactam So Itching    "scalp itch"-was given Benadryl to counteract. "scalp itch"-was given Benadryl to counteract. "scalp itch"-was given Benadryl to counteract.  . Codeine Nausea And Vomiting and Other (See Comments)  . Cyclosporine Other (See Comments)    burns burns burns  . Hydrocodone-Acetaminophen Nausea And Vomiting  . Hydromorphone Hcl Nausea And Vomiting    Can take with zofran  . Oxycodone-Acetaminophen Nausea And Vomiting  . Tramadol Nausea And Vomiting  . Dilaudid [Hydromorphone Hcl] Nausea And Vomiting    Can take with zofran  . Morphine And Related Nausea And Vomiting  . Percocet [Oxycodone-Acetaminophen] Nausea And Vomiting   . Vicodin [Hydrocodone-Acetaminophen] Nausea And Vomiting    Past Medical History, Surgical history, Social history, and Family History were reviewed and updated.  Review of Systems: All other 10 point review of systems is negative.   Physical Exam:  vitals were not taken for this visit.   Wt Readings from Last 3 Encounters:  05/02/20 138 lb (62.6 kg)  01/11/20 143 lb (64.9 kg)  08/13/19 151 lb (68.5 kg)    Ocular: Sclerae unicteric, pupils equal, round and reactive to light Ear-nose-throat: Oropharynx clear, dentition fair Lymphatic: No cervical or supraclavicular adenopathy Lungs no rales or rhonchi, good excursion bilaterally Heart regular rate and rhythm, no murmur appreciated Abd soft, nontender, positive bowel sounds MSK no focal spinal tenderness, no joint edema Neuro: non-focal, well-oriented, appropriate affect Breasts: Deferred   Lab Results  Component Value Date   WBC 8.5 06/03/2020   HGB 14.9 06/03/2020   HCT 42.1 06/03/2020   MCV 97.7 06/03/2020   PLT 290 06/03/2020   Lab Results  Component Value Date  FERRITIN 117 01/19/2016   IRON 83 01/19/2016   TIBC 313 01/19/2016   UIBC 229 01/19/2016   IRONPCTSAT 27 01/19/2016   Lab Results  Component Value Date   RBC 4.31 06/03/2020   No results found for: KPAFRELGTCHN, LAMBDASER, KAPLAMBRATIO No results found for: IGGSERUM, IGA, IGMSERUM No results found for: Odetta Pink, SPEI   Chemistry      Component Value Date/Time   NA 138 01/11/2020 1646   NA 144 04/04/2017 1139   NA 140 09/07/2016 0946   K 4.1 01/11/2020 1646   K 3.5 04/04/2017 1139   K 4.1 09/07/2016 0946   CL 103 01/11/2020 1646   CL 103 04/04/2017 1139   CO2 24 01/11/2020 1646   CO2 33 04/04/2017 1139   CO2 28 09/07/2016 0946   BUN 20 01/11/2020 1646   BUN 15 04/04/2017 1139   BUN 22.6 09/07/2016 0946   CREATININE 0.58 01/11/2020 1646   CREATININE 0.83 08/13/2019 1350    CREATININE 0.9 04/04/2017 1139   CREATININE 0.8 09/07/2016 0946      Component Value Date/Time   CALCIUM 9.1 01/11/2020 1646   CALCIUM 9.5 04/04/2017 1139   CALCIUM 9.6 09/07/2016 0946   ALKPHOS 76 08/13/2019 1350   ALKPHOS 93 (H) 04/04/2017 1139   ALKPHOS 81 09/07/2016 0946   AST 24 08/13/2019 1350   AST 32 09/07/2016 0946   ALT 22 08/13/2019 1350   ALT 36 04/04/2017 1139   ALT 33 09/07/2016 0946   BILITOT 1.7 (H) 08/13/2019 1350   BILITOT 1.34 (H) 09/07/2016 0946       Impression and Plan: Ms. Edelman is a very pleasant 74 yo caucasian female with antiphospholipid antibody syndrome. She has hepatic steatosis with NASH. She is doing well on Eliquis and will continue her same regimen.  She is ok to proceed with knee replacement. She will hold her Eliquis 2 days prior to surgery and then restart the day after.  Follow-up in 3 months.  She can contact our office with any questions or concerns.   Laverna Peace, NP 2/17/20229:46 AM

## 2020-06-03 NOTE — Telephone Encounter (Signed)
appts made and printed for pt per 06/03/20 los   Laurie Green

## 2020-06-05 ENCOUNTER — Other Ambulatory Visit: Payer: Self-pay | Admitting: Hematology & Oncology

## 2020-06-05 DIAGNOSIS — D6861 Antiphospholipid syndrome: Secondary | ICD-10-CM

## 2020-07-01 ENCOUNTER — Other Ambulatory Visit: Payer: Self-pay

## 2020-07-08 ENCOUNTER — Ambulatory Visit: Payer: Self-pay | Admitting: Orthopedic Surgery

## 2020-07-13 NOTE — Patient Instructions (Addendum)
DUE TO COVID-19 ONLY ONE VISITOR IS ALLOWED TO COME WITH YOU AND STAY IN THE WAITING ROOM ONLY DURING PRE OP AND PROCEDURE DAY OF SURGERY. THE 2 VISITORS   MAY VISIT WITH YOU AFTER SURGERY IN YOUR PRIVATE ROOM DURING VISITING HOURS ONLY!  YOU NEED TO HAVE A COVID 19 TEST ON    4/4___ @_2 :35 pm______,  THIS TEST MUST BE DONE BEFORE SURGERY,  COVID TESTING SITE [place] [id number], IT IS ON THE RIGHT GOING OUT [address] APPROXIMATELY  2 MINUTES PAST [employer] ON THE RIGHT.  ONCE YOUR COVID TEST IS COMPLETED,  PLEASE BEGIN THE QUARANTINE INSTRUCTIONS AS OUTLINED IN YOUR HANDOUT.                Lanna Poche   Your procedure is scheduled on: 07/21/20   Report to [facility] Main  Entrance   Report to admitting at   6:00 AM     Call this number if you have problems the morning of surgery [place], NO CHEWING GUM [place].   No food after midnight.    You may have clear liquid until 5:30 AM.   At 5:00 AM drink pre surgery drink.   Nothing by mouth after 5:30 AM.    Take these medicines the morning of surgery with A SIP OF WATER: Propanolol  How to Manage Your Diabetes Before and After Surgery  Why is it important to control my blood sugar before and after surgery? . Improving blood sugar levels before and after surgery helps healing and can limit problems. . A way of improving blood sugar control is eating a healthy diet by: o  Eating less sugar and carbohydrates o  Increasing activity/exercise o  Talking with your doctor about reaching your blood sugar goals . High blood sugars (greater than 180 mg/dL) can raise your risk of infections and slow your recovery, so you will need to focus on controlling your diabetes during the weeks before surgery. . Make sure that the doctor who takes care of your diabetes knows about your planned surgery including the date and  location.  How do I manage my blood sugar before surgery? . Check your blood sugar at least 4 times a day, starting 2 days before surgery, to make sure that the level is not too high or low. o Check your blood sugar the morning of your surgery when you wake up and every 2 hours until you get to the Short Stay unit. . If your blood sugar is less than 70 mg/dL, you will need to treat for low blood sugar: o Do not take insulin. o Treat a low blood sugar (less than 70 mg/dL) with  cup of clear juice (cranberry or apple), 4 glucose tablets, OR glucose gel. o Recheck blood sugar in 15 minutes after treatment (to make sure it is greater than 70 mg/dL). If your blood sugar is not greater than 70 mg/dL on recheck, call [phone] for further instructions. . Report your blood sugar to the short stay nurse when you get to Short Stay.  . If you are admitted to the hospital after surgery: o Your blood sugar will be checked by the staff and you will probably be given insulin after surgery (instead of oral diabetes medicines) to make sure you have good blood sugar levels. o The goal for blood sugar control after surgery  is 80-180 mg/dL.   WHAT DO I DO ABOUT MY DIABETES MEDICATION?  Marland Kitchen Do not take oral diabetes medicines (pills) the morning of surgery.  . THE NIGHT BEFORE SURGERY, take 12    units of  Lantus insulin.       . THE MORNING OF SURGERY, take  0 units of  insulin.  . Dennis Bast may not have any metal on your body including hair pins and              piercings  Do not wear jewelry, make-up, lotions, powders or perfumes, deodorant             Do not wear nail polish on your fingernails.  Do not shave  48 hours prior to surgery.     Do not bring valuables to the hospital. [place].  Contacts, dentures or bridgework may not be worn into surgery.                 [facility] - Preparing for  Surgery Before surgery, you can play an important role.  Because skin is not sterile, your skin needs to be as free of germs as possible.  You can reduce the number of germs on your skin by washing with CHG (chlorahexidine gluconate) soap before surgery.  CHG is an antiseptic cleaner which kills germs and bonds with the skin to continue killing germs even after washing. Please DO NOT use if you have an allergy to CHG or antibacterial soaps.  If your skin becomes reddened/irritated stop using the CHG and inform your nurse when you arrive at Short Stay. Do not shave (including legs and underarms) for at least 48 hours prior to the first CHG shower.   Please follow these instructions carefully:  1.  Shower with CHG Soap the night before surgery and the  morning of Surgery.  2.  If you choose to wash your hair, wash your hair first as usual with your  normal  shampoo.  3.  After you shampoo, rinse your hair and body thoroughly to remove the  shampoo.                                        4.  Use CHG as you would any other liquid soap.  You can apply chg directly  to the skin and wash                       Gently with a scrungie or clean washcloth.  5.  Apply the CHG Soap to your body ONLY FROM THE NECK DOWN.   Do not use on face/ open                           Wound or open sores. Avoid contact with eyes, ears mouth and genitals (private parts).                       Wash face,  Genitals (private parts) with your normal soap.  6.  Wash thoroughly, paying special attention to the area where your surgery  will be performed.  7.  Thoroughly rinse your body with warm water from the neck down.  8.  DO NOT shower/wash with your normal soap after using and rinsing off  the CHG Soap.             9.  Pat yourself dry with a clean towel.            10.  Wear clean pajamas.            11.  Place clean sheets on your bed the night of your first shower and do not  sleep with pets. Day of Surgery : Do  not apply any lotions/deodorants the morning of surgery.  Please wear clean clothes to the hospital/surgery center.  FAILURE TO FOLLOW THESE INSTRUCTIONS MAY RESULT IN THE CANCELLATION OF YOUR SURGERY PATIENT SIGNATURE_________________________________  NURSE SIGNATURE__________________________________  ________________________________________________________________________   Adam Phenix  An incentive spirometer is a tool that can help keep your lungs clear and active. This tool measures how well you are filling your lungs with each breath. Taking long deep breaths may help reverse or decrease the chance of developing breathing (pulmonary) problems (especially infection) following:  A long period of time when you are unable to move or be active. BEFORE THE PROCEDURE   If the spirometer includes an indicator to show your best effort, your nurse or respiratory therapist will set it to a desired goal.  If possible, sit up straight or lean slightly forward. Try not to slouch.  Hold the incentive spirometer in an upright position. INSTRUCTIONS FOR USE  1. Sit on the edge of your bed if possible, or sit up as far as you can in bed or on a chair. 2. Hold the incentive spirometer in an upright position. 3. Breathe out normally. 4. Place the mouthpiece in your mouth and seal your lips tightly around it. 5. Breathe in slowly and as deeply as possible, raising the piston or the ball toward the top of the column. 6. Hold your breath for 3-5 seconds or for as long as possible. Allow the piston or ball to fall to the bottom of the column. 7. Remove the mouthpiece from your mouth and breathe out normally. 8. Rest for a few seconds and repeat Steps 1 through 7 at least 10 times every 1-2 hours when you are awake. Take your time and take a few normal breaths between deep breaths. 9. The spirometer may include an indicator to show your best effort. Use the indicator as a goal to work toward  during each repetition. 10. After each set of 10 deep breaths, practice coughing to be sure your lungs are clear. If you have an incision (the cut made at the time of surgery), support your incision when coughing by placing a pillow or rolled up towels firmly against it. Once you are able to get out of bed, walk around indoors and cough well. You may stop using the incentive spirometer when instructed by your caregiver.  RISKS AND COMPLICATIONS  Take your time so you do not get dizzy or light-headed.  If you are in pain, you may need to take or ask for pain medication before doing incentive spirometry. It is harder to take a deep breath if you are having pain. AFTER USE  Rest and breathe slowly and easily.  It can be helpful to keep track of a log of your progress. Your  caregiver can provide you with a simple table to help with this. If you are using the spirometer at home, follow these instructions: [place] IF:   You are having difficultly using the spirometer.  You have trouble using the spirometer as often as instructed.  Your pain medication is not giving enough relief while using the spirometer.  You develop fever of 100.5 F (38.1 C) or higher. SEEK IMMEDIATE MEDICAL CARE IF:   You cough up bloody sputum that had not been present before.  You develop fever of 102 F (38.9 C) or greater.  You develop worsening pain at or near the incision site. MAKE SURE YOU:   Understand these instructions.  Will watch your condition.  Will get help right away if you are not doing well or get worse. Document Released: 08/14/2006 Document Revised: 06/26/2011 Document Reviewed: 10/15/2006 [facility] Patient Information 2014 [place], [place].   ________________________________________________________________________

## 2020-07-14 ENCOUNTER — Encounter (HOSPITAL_COMMUNITY)
Admission: RE | Admit: 2020-07-14 | Discharge: 2020-07-14 | Disposition: A | Discharge status: Home / Self Care | Payer: PPO | Source: Ambulatory Visit | Attending: Urology | Admitting: Urology

## 2020-07-14 ENCOUNTER — Other Ambulatory Visit: Payer: Self-pay

## 2020-07-14 ENCOUNTER — Encounter (HOSPITAL_COMMUNITY): Payer: Self-pay

## 2020-07-14 ENCOUNTER — Ambulatory Visit: Payer: Self-pay | Admitting: Orthopedic Surgery

## 2020-07-14 DIAGNOSIS — Z01812 Encounter for preprocedural laboratory examination: Secondary | ICD-10-CM | POA: Insufficient documentation

## 2020-07-14 HISTORY — DX: Angina pectoris, unspecified: I20.9

## 2020-07-14 HISTORY — DX: Family history of other specified conditions: Z84.89

## 2020-07-14 NOTE — Progress Notes (Signed)
Pt was concerned that she had an allergic reaction to the covid swab. She spoke with Dr. Reather Littler office. I reassured the Pt that her nose burned because of irritation to the test. I reviewed the policy for covid testing for [facility]. She said that she would keep her covid testing appointments. She will come to [place] [facility] for a labs on Friday 07/16/20  at 10:00 am then go for the covid test for her IVC filter on 07/19/20.

## 2020-07-14 NOTE — H&P (Signed)
Laurie Green is an 75 y.o. female.   Chief Complaint: Right knee pain HPI: The patient comes in for her H&P. The patient is scheduled for a right total knee replacement by Dr. Tonita Cong at [facility] on 07/21/20.  Dr. Tonita Cong and the patient mutually agreed to proceed with a total knee replacement. Risks and benefits of the procedure were discussed including stiffness, suboptimal range of motion, persistent pain, infection requiring removal of prosthesis and reinsertion, need for prophylactic antibiotics in the future, for example, dental procedures, possible need for manipulation, revision in the future and also anesthetic complications including DVT, PE, etc. We discussed the perioperative course, time in the hospital, postoperative recovery and the need for elevation to control swelling. We also discussed the predicted range of motion and the probability that squatting and kneeling would be unobtainable in the future. In addition, postoperative anticoagulation was discussed. We have obtained preoperative medical clearance as necessary. Provided illustrated handout and discussed it in detail. They will enroll in the total joint replacement educational forum at the hospital.  She is scheduled for filter placement with Dr. Donzetta Matters on April 4, 2 days preop. She states she reacted to the Covid test nasal swab previously and is asking for other pre-op testing options. She will be staying with her daughter during the day postoperatively while her husband is at work.  Past Medical History:  Diagnosis Date  . 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 Antiphospholipid antibody with hypercoagulable state ([place]) 11/07/2012  . Arthritis   . Asthma    well controlled, no rescue inhaler used  . Blood dyscrasia    anticardiolipid syndrome  . Blood transfusion    age 20 yr old-none since  . Celiac disease   . Claustrophobia   . Diabetes mellitus    Type II  .  Diverticulitis   . DVT (deep venous thrombosis) ([place]) 05/01/2011   "to liver"  . Fibromyalgia   . GERD (gastroesophageal reflux disease)    11/01/16- not problem  . H/O seasonal allergies   . Heart murmur    "not a problem"  . Hypertension   . NASH (nonalcoholic steatohepatitis) 05/01/2011  . Nausea & vomiting    11-17-14 "nausea and vomiting after meals" at present discussed with Dr. Oletta Lamas office per pt.  . Pneumonia    in 6 grade- "double"  . PONV (postoperative nausea and vomiting)    'I dont have it if they give me something in the IV."  . Portal hypertension ([place])   . Stroke ([place])    tia- eye  . TIA (transient ischemic attack)   . Varices 05/01/2011  . Varices, esophageal ([place])   . Varices, gastric     Past Surgical History:  Procedure Laterality Date  . ABDOMINAL HYSTERECTOMY    . ANKLE FRACTURE SURGERY Left    retained hardware  . CHOLECYSTECTOMY    . COLON SURGERY     hx. diverticulitis  . COLONOSCOPY N/A 11/23/2014   Procedure: COLONOSCOPY;  Surgeon: [name], MD;  Location: [facility] ENDOSCOPY;  Service: Endoscopy;  Laterality: N/A;  . ESOPHAGOGASTRODUODENOSCOPY  04/28/2011   Procedure: ESOPHAGOGASTRODUODENOSCOPY (EGD);  Surgeon: [name]., MD;  Location: [facility] ENDOSCOPY;  Service: Endoscopy;  Laterality: N/A;  . ESOPHAGOGASTRODUODENOSCOPY N/A 11/23/2014   Procedure: ESOPHAGOGASTRODUODENOSCOPY (EGD);  Surgeon: [name], MD;  Location: [facility] ENDOSCOPY;  Service: Endoscopy;  Laterality: N/A;  . ESOPHAGOGASTRODUODENOSCOPY (EGD) WITH PROPOFOL N/A 11/06/2016   Procedure: ESOPHAGOGASTRODUODENOSCOPY (EGD) WITH  PROPOFOL;  Surgeon: [name], MD;  Location: [place];  Service: Endoscopy;  Laterality: N/A;  . EUS N/A 09/25/2012   Procedure: ESOPHAGEAL ENDOSCOPIC ULTRASOUND (EUS) RADIAL;  Surgeon: Arta Silence, MD;  Location: [facility] ENDOSCOPY;  Service: Endoscopy;  Laterality: N/A;  . HERNIA REPAIR     luq incisional  . SPLENECTOMY, TOTAL    . TONSILLECTOMY       No family history on file. Social History:  reports that she has never smoked. She has never used smokeless tobacco. She reports that she does not drink alcohol and does not use drugs.  Allergies:  Allergies  Allergen Reactions  . Hydrocodone Anaphylaxis and Nausea And Vomiting  . Lidocaine Hcl Anaphylaxis  . Morphine Anaphylaxis and Other (See Comments)  . Piperacillin Sod-Tazobactam So Itching    "scalp itch"-was given Benadryl to counteract. "scalp itch"-was given Benadryl to counteract. "scalp itch"-was given Benadryl to counteract.  . Codeine Nausea And Vomiting and Other (See Comments)  . Cyclosporine Other (See Comments)    burns burns burns  . Hydromorphone Hcl Nausea And Vomiting    Can take with zofran  . Dilaudid [Hydromorphone Hcl] Nausea And Vomiting    Can take with zofran  . Morphine And Related Nausea And Vomiting  . Percocet [Oxycodone-Acetaminophen] Nausea And Vomiting   Medications: azithromycin 250 mg tablet benazepriL 20 mg tablet cetirizine 10 mg tablet Eliquis 5 mg tablet famotidine gabapentin 100 mg capsule hydrOXYzine HCL  Lantus Solostar U-100 Insulin 100 unit/mL (3 mL) subcutaneous pen INJECT 18 TO 24 UNITS ONCE DAILY IN THE EVENING 50 LORazepam 1 mg tablet Premarin 0.625 mg/gram vaginal cream propranoloL ER 80 mg capsule,24 hr,extended release traMADoL 50 mg tablet triamcinolone acetonide 0.1 % topical cream  Review of Systems  Constitutional: Negative.   HENT: Negative.   Eyes: Negative.   Respiratory: Negative.   Cardiovascular: Negative.   Gastrointestinal: Negative.   Endocrine: Negative.   Genitourinary: Negative.   Musculoskeletal: Positive for arthralgias, joint swelling and myalgias.  Neurological: Negative.     There were no vitals taken for this visit. Physical Exam Constitutional:      Appearance: Normal appearance.  HENT:     Head: Normocephalic.     Right Ear: External ear normal.     Left Ear: External ear  normal.     Nose: Nose normal.     Mouth/Throat:     Pharynx: Oropharynx is clear.  Eyes:     Conjunctiva/sclera: Conjunctivae normal.  Cardiovascular:     Rate and Rhythm: Normal rate and regular rhythm.     Pulses: Normal pulses.  Pulmonary:     Effort: Pulmonary effort is normal.  Abdominal:     General: Bowel sounds are normal.  Musculoskeletal:     Cervical back: Normal range of motion.     Comments: Exquisitely tender medial joint line. Patellofemoral pain compression ranges 0-100. Tender lateral joint line. Moderate effusion.  Skin:    General: Skin is warm and dry.  Neurological:     Mental Status: She is alert.      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 Tonita Cong on 4/6. We again discussed the procedure itself as well as risks, complications and alternatives, including but not limited to DVT, PE, infx, bleeding, failure of procedure, need for secondary procedure including manipulation, nerve injury, ongoing pain/symptoms, anesthesia risk, even stroke or  death. Also discussed typical post-op protocols, activity restrictions, need for PT, flexion/extension exercises, time out of work. Discussed need for DVT ppx post-op per protocol. Discussed dental ppx and infx prevention. Also discussed limitations post-operatively such as kneeling and squatting. All questions were answered. Patient desires to proceed with surgery as scheduled.  Will hold supplements, ASA and NSAIDs accordingly. Will remain NPO after midnight the night before surgery. Will present to [facility] for pre-op testing. Anticipate hospital stay to include at least 2 midnights given medical history and to ensure proper pain control. Plan Eliquis for DVT ppx post-op. Plan oxycodone with Zofran, Colace, Miralax. Plan home with HHPT post-op  with family members at home for assistance. Will follow up 10-14 days post-op for suture removal and xrays.  Plan Right total knee replacement  Cecilie Kicks, PA-C for Dr. Tonita Cong 07/14/2020, 12:22 PM

## 2020-07-14 NOTE — Progress Notes (Signed)
COVID Vaccine Completed:No Date COVID Vaccine completed: COVID vaccine manufacturer: Pfizer    [employer]   PCP - Dr. Beckie Salts Cardiologist - Dr. Pearletha Alfred  Chest x-ray -no  EKG - 01/11/20-epic Stress Test - no ECHO -08/02/19-epic  Cardiac Cath - no Pacemaker/ICD device last checked:NA  Sleep Study - no CPAP -   Fasting Blood Sugar - 97-120 Checks Blood Sugar 5_____ times a day. Takes Lantus  Blood Thinner Instructions:Eliquis/ Dr. Marin Olp Aspirin Instructions: Stop 2 days prior to DOS/ Marin Olp Last Dose:07/19/20  Anesthesia review:   Patient denies shortness of breath, fever, cough and chest pain at PAT appointment yes  Patient verbalized understanding of instructions that were given to them at the PAT appointment. Patient was also instructed that they will need to review over the PAT instructions again at home before surgery.Yes . Pt doesn't climb stairs but reports no SOB doing housework or with ADLs.  She has anticardiolipin antibodies, has had DVTs, TIA and CVA.  An IVP filter will be placed 07/19/20.

## 2020-07-14 NOTE — H&P (View-Only) (Signed)
Laurie Green is an 75 y.o. female.   Chief Complaint: Right knee pain HPI: The patient comes in for her H&P. The patient is scheduled for a right total knee replacement by Dr. Tonita Cong at [facility] on 07/21/20.  Dr. Tonita Cong and the patient mutually agreed to proceed with a total knee replacement. Risks and benefits of the procedure were discussed including stiffness, suboptimal range of motion, persistent pain, infection requiring removal of prosthesis and reinsertion, need for prophylactic antibiotics in the future, for example, dental procedures, possible need for manipulation, revision in the future and also anesthetic complications including DVT, PE, etc. We discussed the perioperative course, time in the hospital, postoperative recovery and the need for elevation to control swelling. We also discussed the predicted range of motion and the probability that squatting and kneeling would be unobtainable in the future. In addition, postoperative anticoagulation was discussed. We have obtained preoperative medical clearance as necessary. Provided illustrated handout and discussed it in detail. They will enroll in the total joint replacement educational forum at the hospital.  She is scheduled for filter placement with Dr. Donzetta Matters on April 4, 2 days preop. She states she reacted to the Covid test nasal swab previously and is asking for other pre-op testing options. She will be staying with her daughter during the day postoperatively while her husband is at work.  Past Medical History:  Diagnosis Date  . 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 Antiphospholipid antibody with hypercoagulable state ([place]) 11/07/2012  . Arthritis   . Asthma    well controlled, no rescue inhaler used  . Blood dyscrasia    anticardiolipid syndrome  . Blood transfusion    age 7 yr old-none since  . Celiac disease   . Claustrophobia   . Diabetes mellitus    Type II  .  Diverticulitis   . DVT (deep venous thrombosis) ([place]) 05/01/2011   "to liver"  . Fibromyalgia   . GERD (gastroesophageal reflux disease)    11/01/16- not problem  . H/O seasonal allergies   . Heart murmur    "not a problem"  . Hypertension   . NASH (nonalcoholic steatohepatitis) 05/01/2011  . Nausea & vomiting    11-17-14 "nausea and vomiting after meals" at present discussed with Dr. Oletta Lamas office per pt.  . Pneumonia    in 6 grade- "double"  . PONV (postoperative nausea and vomiting)    'I dont have it if they give me something in the IV."  . Portal hypertension ([place])   . 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 TIA (transient ischemic attack)   . Varices 05/01/2011  . Varices, esophageal ([place])   . Varices, gastric     Past Surgical History:  Procedure Laterality Date  . ABDOMINAL HYSTERECTOMY    . ANKLE FRACTURE SURGERY Left    retained hardware  . CHOLECYSTECTOMY    . COLON SURGERY     hx. diverticulitis  . COLONOSCOPY N/A 11/23/2014   Procedure: COLONOSCOPY;  Surgeon: [name], MD;  Location: [facility] ENDOSCOPY;  Service: Endoscopy;  Laterality: N/A;  . ESOPHAGOGASTRODUODENOSCOPY  04/28/2011   Procedure: ESOPHAGOGASTRODUODENOSCOPY (EGD);  Surgeon: [name]., MD;  Location: [facility] ENDOSCOPY;  Service: Endoscopy;  Laterality: N/A;  . ESOPHAGOGASTRODUODENOSCOPY N/A 11/23/2014   Procedure: ESOPHAGOGASTRODUODENOSCOPY (EGD);  Surgeon: [name], MD;  Location: [facility];  Service: Endoscopy;  Laterality: N/A;  . ESOPHAGOGASTRODUODENOSCOPY (EGD) WITH PROPOFOL N/A 11/06/2016   Procedure: ESOPHAGOGASTRODUODENOSCOPY (EGD) WITH  PROPOFOL;  Surgeon: [name], MD;  Location: [place];  Service: Endoscopy;  Laterality: N/A;  . EUS N/A 09/25/2012   Procedure: ESOPHAGEAL ENDOSCOPIC ULTRASOUND (EUS) RADIAL;  Surgeon: Arta Silence, MD;  Location: [facility] ENDOSCOPY;  Service: Endoscopy;  Laterality: N/A;  . HERNIA REPAIR     luq incisional  . SPLENECTOMY, TOTAL    . TONSILLECTOMY       No family history on file. Social History:  reports that she has never smoked. She has never used smokeless tobacco. She reports that she does not drink alcohol and does not use drugs.  Allergies:  Allergies  Allergen Reactions  . Hydrocodone Anaphylaxis and Nausea And Vomiting  . Lidocaine Hcl Anaphylaxis  . Morphine Anaphylaxis and Other (See Comments)  . Piperacillin Sod-Tazobactam So Itching    "scalp itch"-was given Benadryl to counteract. "scalp itch"-was given Benadryl to counteract. "scalp itch"-was given Benadryl to counteract.  . Codeine Nausea And Vomiting and Other (See Comments)  . Cyclosporine Other (See Comments)    burns burns burns  . Hydromorphone Hcl Nausea And Vomiting    Can take with zofran  . Dilaudid [Hydromorphone Hcl] Nausea And Vomiting    Can take with zofran  . Morphine And Related Nausea And Vomiting  . Percocet [Oxycodone-Acetaminophen] Nausea And Vomiting   Medications: azithromycin 250 mg tablet benazepriL 20 mg tablet cetirizine 10 mg tablet Eliquis 5 mg tablet famotidine gabapentin 100 mg capsule hydrOXYzine HCL  Lantus Solostar U-100 Insulin 100 unit/mL (3 mL) subcutaneous pen INJECT 18 TO 24 UNITS ONCE DAILY IN THE EVENING 50 LORazepam 1 mg tablet Premarin 0.625 mg/gram vaginal cream propranoloL ER 80 mg capsule,24 hr,extended release traMADoL 50 mg tablet triamcinolone acetonide 0.1 % topical cream  Review of Systems  Constitutional: Negative.   HENT: Negative.   Eyes: Negative.   Respiratory: Negative.   Cardiovascular: Negative.   Gastrointestinal: Negative.   Endocrine: Negative.   Genitourinary: Negative.   Musculoskeletal: Positive for arthralgias, joint swelling and myalgias.  Neurological: Negative.     There were no vitals taken for this visit. Physical Exam Constitutional:      Appearance: Normal appearance.  HENT:     Head: Normocephalic.     Right Ear: External ear normal.     Left Ear: External ear  normal.     Nose: Nose normal.     Mouth/Throat:     Pharynx: Oropharynx is clear.  Eyes:     Conjunctiva/sclera: Conjunctivae normal.  Cardiovascular:     Rate and Rhythm: Normal rate and regular rhythm.     Pulses: Normal pulses.  Pulmonary:     Effort: Pulmonary effort is normal.  Abdominal:     General: Bowel sounds are normal.  Musculoskeletal:     Cervical back: Normal range of motion.     Comments: Exquisitely tender medial joint line. Patellofemoral pain compression ranges 0-100. Tender lateral joint line. Moderate effusion.  Skin:    General: Skin is warm and dry.  Neurological:     Mental Status: She is alert.      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 Tonita Cong on 4/6. We again discussed the procedure itself as well as risks, complications and alternatives, including but not limited to DVT, PE, infx, bleeding, failure of procedure, need for secondary procedure including manipulation, nerve injury, ongoing pain/symptoms, anesthesia risk, even stroke or  death. Also discussed typical post-op protocols, activity restrictions, need for PT, flexion/extension exercises, time out of work. Discussed need for DVT ppx post-op per protocol. Discussed dental ppx and infx prevention. Also discussed limitations post-operatively such as kneeling and squatting. All questions were answered. Patient desires to proceed with surgery as scheduled.  Will hold supplements, ASA and NSAIDs accordingly. Will remain NPO after midnight the night before surgery. Will present to [facility] for pre-op testing. Anticipate hospital stay to include at least 2 midnights given medical history and to ensure proper pain control. Plan Eliquis for DVT ppx post-op. Plan oxycodone with Zofran, Colace, Miralax. Plan home with HHPT post-op  with family members at home for assistance. Will follow up 10-14 days post-op for suture removal and xrays.  Plan Right total knee replacement  Cecilie Kicks, PA-C for Dr. Tonita Cong 07/14/2020, 12:22 PM

## 2020-07-16 ENCOUNTER — Encounter (HOSPITAL_COMMUNITY)
Admission: RE | Admit: 2020-07-16 | Discharge: 2020-07-16 | Disposition: A | Discharge status: Home / Self Care | Payer: PPO | Source: Ambulatory Visit

## 2020-07-16 ENCOUNTER — Other Ambulatory Visit (HOSPITAL_COMMUNITY)
Admission: RE | Admit: 2020-07-16 | Discharge: 2020-07-16 | Disposition: A | Discharge status: Home / Self Care | Payer: PPO | Source: Ambulatory Visit | Attending: Vascular Surgery | Admitting: Vascular Surgery

## 2020-07-16 DIAGNOSIS — Z20822 Contact with and (suspected) exposure to covid-19: Secondary | ICD-10-CM | POA: Insufficient documentation

## 2020-07-16 DIAGNOSIS — Z01812 Encounter for preprocedural laboratory examination: Secondary | ICD-10-CM | POA: Diagnosis not present

## 2020-07-16 LAB — CBC
HCT: 44.6 % (ref 36.0–46.0)
Hemoglobin: 15.1 g/dL — ABNORMAL HIGH (ref 12.0–15.0)
MCH: 33.6 pg (ref 26.0–34.0)
MCHC: 33.9 g/dL (ref 30.0–36.0)
MCV: 99.3 fL (ref 80.0–100.0)
Platelets: 303 10*3/uL (ref 150–400)
RBC: 4.49 MIL/uL (ref 3.87–5.11)
RDW: 12.6 % (ref 11.5–15.5)
WBC: 8.4 10*3/uL (ref 4.0–10.5)
nRBC: 0 % (ref 0.0–0.2)

## 2020-07-16 LAB — BASIC METABOLIC PANEL
Anion gap: 8 (ref 5–15)
BUN: 21 mg/dL (ref 8–23)
CO2: 30 mmol/L (ref 22–32)
Calcium: 9.6 mg/dL (ref 8.9–10.3)
Chloride: 104 mmol/L (ref 98–111)
Creatinine, Ser: 0.56 mg/dL (ref 0.44–1.00)
GFR, Estimated: >60 mL/min (ref >60)
Glucose, Bld: 117 mg/dL — ABNORMAL HIGH (ref 70–99)
Potassium: 4.4 mmol/L (ref 3.5–5.1)
Sodium: 142 mmol/L (ref 135–145)

## 2020-07-16 LAB — URINALYSIS, ROUTINE W REFLEX MICROSCOPIC
Bilirubin Urine: NEGATIVE
Glucose, UA: NEGATIVE mg/dL
Hgb urine dipstick: NEGATIVE
Ketones, ur: NEGATIVE mg/dL
Leukocytes,Ua: NEGATIVE
Nitrite: NEGATIVE
Protein, ur: NEGATIVE mg/dL
Specific Gravity, Urine: 1.008 (ref 1.005–1.030)
pH: 7 (ref 5.0–8.0)

## 2020-07-16 LAB — SURGICAL PCR SCREEN
MRSA, PCR: NEGATIVE
Staphylococcus aureus: POSITIVE — AB

## 2020-07-16 LAB — PROTIME-INR
INR: 1.5 — ABNORMAL HIGH (ref 0.8–1.2)
Prothrombin Time: 17.2 seconds — ABNORMAL HIGH (ref 11.4–15.2)

## 2020-07-16 LAB — APTT: aPTT: 31 seconds (ref 24–36)

## 2020-07-16 NOTE — Progress Notes (Signed)
PCR results and PT/INR results 07/16/20 faxed via epic to Dr. Tonita Cong.

## 2020-07-17 LAB — SARS CORONAVIRUS 2 (TAT 6-24 HRS): SARS Coronavirus 2: NEGATIVE

## 2020-07-19 ENCOUNTER — Ambulatory Visit (HOSPITAL_COMMUNITY)
Admission: RE | Disposition: A | Discharge status: Home / Self Care | Payer: Self-pay | Source: Home / Self Care | Attending: Vascular Surgery

## 2020-07-19 ENCOUNTER — Ambulatory Visit (HOSPITAL_COMMUNITY)
Admission: RE | Admit: 2020-07-19 | Discharge: 2020-07-19 | Disposition: A | Discharge status: Home / Self Care | Payer: PPO | Source: Home / Self Care | Attending: Vascular Surgery | Admitting: Vascular Surgery

## 2020-07-19 ENCOUNTER — Other Ambulatory Visit (HOSPITAL_COMMUNITY)
Admission: RE | Admit: 2020-07-19 | Discharge: 2020-07-19 | Disposition: A | Discharge status: Home / Self Care | Payer: PPO | Source: Ambulatory Visit | Attending: Vascular Surgery | Admitting: Vascular Surgery

## 2020-07-19 ENCOUNTER — Encounter (HOSPITAL_COMMUNITY): Payer: Self-pay | Admitting: Vascular Surgery

## 2020-07-19 DIAGNOSIS — E119 Type 2 diabetes mellitus without complications: Secondary | ICD-10-CM | POA: Diagnosis present

## 2020-07-19 DIAGNOSIS — Z471 Aftercare following joint replacement surgery: Secondary | ICD-10-CM | POA: Diagnosis not present

## 2020-07-19 DIAGNOSIS — Z7901 Long term (current) use of anticoagulants: Secondary | ICD-10-CM | POA: Insufficient documentation

## 2020-07-19 DIAGNOSIS — Z87892 Personal history of anaphylaxis: Secondary | ICD-10-CM | POA: Diagnosis not present

## 2020-07-19 DIAGNOSIS — Z20822 Contact with and (suspected) exposure to covid-19: Secondary | ICD-10-CM | POA: Insufficient documentation

## 2020-07-19 DIAGNOSIS — Z79899 Other long term (current) drug therapy: Secondary | ICD-10-CM | POA: Insufficient documentation

## 2020-07-19 DIAGNOSIS — Z885 Allergy status to narcotic agent status: Secondary | ICD-10-CM | POA: Insufficient documentation

## 2020-07-19 DIAGNOSIS — Z01812 Encounter for preprocedural laboratory examination: Secondary | ICD-10-CM | POA: Insufficient documentation

## 2020-07-19 DIAGNOSIS — M1711 Unilateral primary osteoarthritis, right knee: Secondary | ICD-10-CM | POA: Diagnosis present

## 2020-07-19 DIAGNOSIS — Z86718 Personal history of other venous thrombosis and embolism: Secondary | ICD-10-CM | POA: Insufficient documentation

## 2020-07-19 DIAGNOSIS — Z96651 Presence of right artificial knee joint: Secondary | ICD-10-CM | POA: Diagnosis not present

## 2020-07-19 DIAGNOSIS — Z8701 Personal history of pneumonia (recurrent): Secondary | ICD-10-CM | POA: Diagnosis not present

## 2020-07-19 DIAGNOSIS — K7581 Nonalcoholic steatohepatitis (NASH): Secondary | ICD-10-CM | POA: Diagnosis present

## 2020-07-19 DIAGNOSIS — X58XXXA Exposure to other specified factors, initial encounter: Secondary | ICD-10-CM | POA: Diagnosis present

## 2020-07-19 DIAGNOSIS — Z794 Long term (current) use of insulin: Secondary | ICD-10-CM | POA: Insufficient documentation

## 2020-07-19 DIAGNOSIS — I10 Essential (primary) hypertension: Secondary | ICD-10-CM | POA: Diagnosis present

## 2020-07-19 DIAGNOSIS — Z8673 Personal history of transient ischemic attack (TIA), and cerebral infarction without residual deficits: Secondary | ICD-10-CM | POA: Diagnosis not present

## 2020-07-19 DIAGNOSIS — Z880 Allergy status to penicillin: Secondary | ICD-10-CM | POA: Diagnosis not present

## 2020-07-19 DIAGNOSIS — M84351A Stress fracture, right femur, initial encounter for fracture: Secondary | ICD-10-CM | POA: Diagnosis present

## 2020-07-19 DIAGNOSIS — Z888 Allergy status to other drugs, medicaments and biological substances status: Secondary | ICD-10-CM | POA: Diagnosis not present

## 2020-07-19 DIAGNOSIS — Z90710 Acquired absence of both cervix and uterus: Secondary | ICD-10-CM | POA: Diagnosis not present

## 2020-07-19 DIAGNOSIS — K219 Gastro-esophageal reflux disease without esophagitis: Secondary | ICD-10-CM | POA: Diagnosis present

## 2020-07-19 DIAGNOSIS — Z886 Allergy status to analgesic agent status: Secondary | ICD-10-CM | POA: Diagnosis not present

## 2020-07-19 DIAGNOSIS — M797 Fibromyalgia: Secondary | ICD-10-CM | POA: Diagnosis present

## 2020-07-19 DIAGNOSIS — Z884 Allergy status to anesthetic agent status: Secondary | ICD-10-CM | POA: Diagnosis not present

## 2020-07-19 DIAGNOSIS — D6861 Antiphospholipid syndrome: Secondary | ICD-10-CM | POA: Diagnosis present

## 2020-07-19 DIAGNOSIS — M25561 Pain in right knee: Secondary | ICD-10-CM | POA: Diagnosis not present

## 2020-07-19 DIAGNOSIS — J45909 Unspecified asthma, uncomplicated: Secondary | ICD-10-CM | POA: Diagnosis present

## 2020-07-19 HISTORY — PX: IVC FILTER INSERTION: CATH118245

## 2020-07-19 LAB — POCT I-STAT, CHEM 8
BUN: 23 mg/dL (ref 8–23)
Calcium, Ion: 1.14 mmol/L — ABNORMAL LOW (ref 1.15–1.40)
Chloride: 103 mmol/L (ref 98–111)
Creatinine, Ser: 0.7 mg/dL (ref 0.44–1.00)
Glucose, Bld: 187 mg/dL — ABNORMAL HIGH (ref 70–99)
HCT: 41 % (ref 36.0–46.0)
Hemoglobin: 13.9 g/dL (ref 12.0–15.0)
Potassium: 3.9 mmol/L (ref 3.5–5.1)
Sodium: 141 mmol/L (ref 135–145)
TCO2: 32 mmol/L (ref 22–32)

## 2020-07-19 SURGERY — IVC FILTER INSERTION
Anesthesia: LOCAL

## 2020-07-19 MED ORDER — ONDANSETRON HCL 4 MG/2ML IJ SOLN: 4.0000 mg | Freq: Four times a day (QID) | INTRAMUSCULAR | Status: DC | PRN

## 2020-07-19 MED ORDER — SODIUM CHLORIDE 0.9% FLUSH: 3.0000 mL | Freq: Two times a day (BID) | INTRAVENOUS | Status: DC

## 2020-07-19 MED ORDER — HYDRALAZINE HCL 20 MG/ML IJ SOLN: 5.0000 mg | INTRAMUSCULAR | Status: DC | PRN

## 2020-07-19 MED ORDER — SODIUM CHLORIDE 0.9 % IV SOLN: 250.0000 mL | INTRAVENOUS | Status: DC | PRN

## 2020-07-19 MED ORDER — MIDAZOLAM HCL 2 MG/2ML IJ SOLN
INTRAMUSCULAR | Status: AC
  Filled 2020-07-19: qty 2

## 2020-07-19 MED ORDER — TETRACAINE HCL 1 % IJ SOLN
150.0000 mg | Freq: Once | INTRAMUSCULAR | Status: DC
  Filled 2020-07-19: qty 15

## 2020-07-19 MED ORDER — FENTANYL CITRATE (PF) 100 MCG/2ML IJ SOLN
INTRAMUSCULAR | Status: DC | PRN
  Administered 2020-07-19: 25 ug via INTRAVENOUS

## 2020-07-19 MED ORDER — HEPARIN (PORCINE) IN NACL 1000-0.9 UT/500ML-% IV SOLN
INTRAVENOUS | Status: DC | PRN
  Administered 2020-07-19: 500 mL

## 2020-07-19 MED ORDER — SODIUM CHLORIDE 0.9 % WEIGHT BASED INFUSION: 1.0000 mL/kg/h | INTRAVENOUS | Status: DC

## 2020-07-19 MED ORDER — LIDOCAINE HCL (PF) 1 % IJ SOLN
INTRAMUSCULAR | Status: AC
  Filled 2020-07-19: qty 30

## 2020-07-19 MED ORDER — FENTANYL CITRATE (PF) 100 MCG/2ML IJ SOLN
INTRAMUSCULAR | Status: AC
  Filled 2020-07-19: qty 2

## 2020-07-19 MED ORDER — SODIUM CHLORIDE 0.9 % IV SOLN: INTRAVENOUS | Status: DC

## 2020-07-19 MED ORDER — LABETALOL HCL 5 MG/ML IV SOLN: 10.0000 mg | INTRAVENOUS | Status: DC | PRN

## 2020-07-19 MED ORDER — IODIXANOL 320 MG/ML IV SOLN
INTRAVENOUS | Status: DC | PRN
  Administered 2020-07-19: 30 mL via INTRAVENOUS

## 2020-07-19 MED ORDER — SODIUM CHLORIDE 0.9% FLUSH: 3.0000 mL | INTRAVENOUS | Status: DC | PRN

## 2020-07-19 MED ORDER — HEPARIN (PORCINE) IN NACL 1000-0.9 UT/500ML-% IV SOLN
INTRAVENOUS | Status: AC
  Filled 2020-07-19: qty 500

## 2020-07-19 MED ORDER — MIDAZOLAM HCL 2 MG/2ML IJ SOLN
INTRAMUSCULAR | Status: DC | PRN
  Administered 2020-07-19: 1 mg via INTRAVENOUS

## 2020-07-19 SURGICAL SUPPLY — 12 items
BAG SNAP BAND KOVER 36X36 (MISCELLANEOUS) ×2 IMPLANT
COVER DOME SNAP 22 D (MISCELLANEOUS) ×2 IMPLANT
FILTER VC CELECT-FEMORAL (Filter) ×2 IMPLANT
KIT MICROPUNCTURE NIT STIFF (SHEATH) ×2 IMPLANT
KIT PV (KITS) ×2 IMPLANT
PROTECTION STATION PRESSURIZED (MISCELLANEOUS) ×2
SHEATH PROBE COVER 6X72 (BAG) ×2 IMPLANT
STATION PROTECTION PRESSURIZED (MISCELLANEOUS) ×1 IMPLANT
TRANSDUCER W/STOPCOCK (MISCELLANEOUS) ×2 IMPLANT
TRAY PV CATH (CUSTOM PROCEDURE TRAY) ×2 IMPLANT
TUBE CONN 8.8X1320 FR HP M-F (CONNECTOR) ×2 IMPLANT
WIRE BENTSON .035X145CM (WIRE) ×2 IMPLANT

## 2020-07-19 NOTE — Op Note (Signed)
    Patient name: Laurie Green MRN: [id number] DOB: 07-Sep-1945 Sex: female  07/19/2020 Pre-operative Diagnosis: History of DVT, need for right total knee arthroplasty Post-operative diagnosis:  Same Surgeon:  Eda Paschal. Donzetta Matters, MD Procedure Performed: 1.  Ultrasound-guided cannulation right common femoral vein 2.  IVC venogram 3.  Placement of Cook celect IVC filter 4.  Moderate sedation with fentanyl Versed for 6 minutes  Indications: 75 year old female with history of multiple DVTs in the past.  She is now indicated for right total knee arthroplasty.  She is indicated for IVC filter as she hold her Eliquis for the procedure.  Findings: The IVC was patent and less than 20 mm.  Filter was placed with a hook at the level of the left renal vein.  Right renal vein was much higher than the left.  We will plan to see her back in 3 months to evaluate for IVC filter removal   Procedure:  The patient was identified in the holding area and taken to room 8.  The patient was then placed supine on the table and prepped and draped in the usual sterile fashion.  A time out was called.  Ultrasound was used to evaluate the right common femoral vein which was noted to be patent and compressible.  The area was anesthetized with 1% tetracaine.  Moderate sedation with fentanyl and Versed was administered and her vital signs were monitored throughout the course.  We cannulated the tongue femoral vein with micropuncture needle followed by wire sheath.  A Bentson wire was placed into the IVC.  We serially dilated the tract up to 10 [place].  We then placed the introducer sheath.  Central venogram was performed.  Filter was placed the level of the left renal vein.  Sheath was removed pressure held till hemostasis obtained.  She tolerated procedure well without any complication.   Contrast: 30 cc   Yurem Viner C. Donzetta Matters, MD [facility] Office: [phone] Pager: [phone]

## 2020-07-19 NOTE — Discharge Instructions (Signed)
Inferior Vena Cava Filter Insertion, Care After This sheet gives you information about how to care for yourself after your procedure. Your health care provider may also give you more specific instructions. If you have problems or questions, contact your health care provider. What can I expect after the procedure? After the procedure, it is common to have:  Mild pain in the area where the long, thin tube (catheter) used to deliver the filter was inserted.  Mild bruising in the area where the catheter used to deliver the filter was inserted. Follow these instructions at home: Insertion site care  Follow instructions from your health care provider about how to take care of your catheter insertion site. Make sure you: ? Wash your hands with soap and water for at least 20 seconds before and after you change your bandage (dressing). If soap and water are not available, use hand sanitizer. ? Change your dressing as told by your health care provider.  Keep the dressing and the insertion site clean and dry.  Check your insertion site every day for signs of infection. Check for: ? More redness, swelling, or pain. ? Fluid or blood. ? Warmth. ? Pus or a bad smell.  Do not take baths, swim, or use a hot tub until your health care provider approves. Ask your health care provider if you may take showers.   Activity  Avoid strenuous exercise or activities that take a lot of effort for 48 hours after the procedure or as told by your health care provider.  Do not go back to school or work until your health care provider approves.  Do not lift anything that is heavier than 5 lb (2.3 kg), or the limit that you are told, until your health care provider says that it is safe. Driving  If you were given a sedative during the procedure, it can affect you for several hours. Do not drive or operate machinery until your health care provider says that it is safe.  Ask your health care provider if the medicine  prescribed to you requires you to avoid driving or using heavy machinery. General instructions  Take over-the-counter and prescription medicines only as told by your health care provider.  Wear compression stockings as told by your health care provider. These stockings help to prevent blood clots and reduce swelling in your legs.  Keep all follow-up visits as told by your health care provider. This is important. Contact a health care provider if:  You have any of these signs of infection: ? More redness, swelling, or pain around your catheter insertion site. ? Fluid or blood coming from your insertion site. ? Warmth coming from your insertion site. ? Pus or a bad smell coming from your insertion site. ? A fever.  You are dizzy.  You have nausea and vomiting.  You develop a rash. Get help right away if:  You have chest pain, a cough, or difficulty breathing.  You have shortness of breath, feel faint, or pass out.  You cough up blood.  You have severe pain in your abdomen.  You develop swelling and discoloration or pain in your legs.  Your legs become pale and cold or blue.  You have weakness, difficulty moving your arms or legs, or balance problems.  You develop problems with speech or vision. These symptoms may represent a serious problem that is an emergency. Do not wait to see if the symptoms will go away. Get medical help right away. Call your local emergency  services (911 in the [place] Do not drive yourself to the hospital. Summary  After the procedure, it is common to have mild pain and bruising where a catheter was inserted at your neck or groin (catheter insertion site).  Do not shower, bathe, use a hot tub, or let the dressing get wet until your health care provider approves.  Every day, check for signs of infection at the catheter insertion site. This information is not intended to replace advice given to you by your health care provider. Make sure you discuss  any questions you have with your health care provider. Document Revised: 04/02/2019 Document Reviewed: 04/02/2019 Elsevier Patient Education  2021 [employer].

## 2020-07-19 NOTE — H&P (Signed)
Hospital Consult    History of Present Illness: This is a 75 y.o. female with history of multiple dvts. No indicated for right tka. Has history of ivc filter at [facility] 22 years ago, unsure why she no longer has it.   Past Medical History:  Diagnosis Date  . Anginal pain ([place])   . 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 Antiphospholipid antibody with hypercoagulable state ([place]) 11/07/2012  . Arthritis   . Asthma    well controlled, no rescue inhaler used  . Blood dyscrasia    anticardiolipid syndrome  . Blood transfusion    age 82 yr old-none since  . Celiac disease   . Diabetes mellitus    Type II  . Diverticulitis   . DVT (deep venous thrombosis) ([place]) 05/01/2011   "to liver"  . Family history of adverse reaction to anesthesia    N&V  . Fibromyalgia   . GERD (gastroesophageal reflux disease)    11/01/16- not problem  . H/O seasonal allergies   . Heart murmur    "not a problem"  . Hypertension   . NASH (nonalcoholic steatohepatitis) 05/01/2011  . Nausea & vomiting    11-17-14 "nausea and vomiting after meals" at present discussed with Dr. Oletta Lamas office per pt.  . Pneumonia 2021  . PONV (postoperative nausea and vomiting)    'I dont have it if they give me something in the IV."  . Portal hypertension ([place])   . Stroke ([facility])    tia- eye,  . TIA (transient ischemic attack)   . Varices 05/01/2011  . Varices, esophageal ([place])   . Varices, gastric     Past Surgical History:  Procedure Laterality Date  . ABDOMINAL HYSTERECTOMY    . ANKLE FRACTURE SURGERY Left    retained hardware  . CHOLECYSTECTOMY    . COLON SURGERY     hx. diverticulitis  . COLONOSCOPY N/A 11/23/2014   Procedure: COLONOSCOPY;  Surgeon: [name], MD;  Location: [facility] ENDOSCOPY;  Service: Endoscopy;  Laterality: N/A;  . ESOPHAGOGASTRODUODENOSCOPY  04/28/2011   Procedure: ESOPHAGOGASTRODUODENOSCOPY (EGD);  Surgeon: [name]., MD;  Location: [facility] ENDOSCOPY;   Service: Endoscopy;  Laterality: N/A;  . ESOPHAGOGASTRODUODENOSCOPY N/A 11/23/2014   Procedure: ESOPHAGOGASTRODUODENOSCOPY (EGD);  Surgeon: [name], MD;  Location: [facility] ENDOSCOPY;  Service: Endoscopy;  Laterality: N/A;  . ESOPHAGOGASTRODUODENOSCOPY (EGD) WITH PROPOFOL N/A 11/06/2016   Procedure: ESOPHAGOGASTRODUODENOSCOPY (EGD) WITH PROPOFOL;  Surgeon: [name], MD;  Location: [facility];  Service: Endoscopy;  Laterality: N/A;  . EUS N/A 09/25/2012   Procedure: ESOPHAGEAL ENDOSCOPIC ULTRASOUND (EUS) RADIAL;  Surgeon: Arta Silence, MD;  Location: [facility] ENDOSCOPY;  Service: Endoscopy;  Laterality: N/A;  . HERNIA REPAIR     luq incisional  . SPLENECTOMY, TOTAL    . TONSILLECTOMY      Allergies  Allergen Reactions  . Hydrocodone Anaphylaxis and Nausea And Vomiting  . Lidocaine Hcl Anaphylaxis  . Morphine Anaphylaxis and Other (See Comments)  . Piperacillin Sod-Tazobactam So Itching    "scalp itch"-was given Benadryl to counteract. "scalp itch"-was given Benadryl to counteract. "scalp itch"-was given Benadryl to counteract.  . Codeine Nausea And Vomiting and Other (See Comments)  . Cyclosporine Other (See Comments)    burns burns burns  . Hydromorphone Hcl Nausea And Vomiting    Can take with zofran  . Dilaudid [Hydromorphone Hcl] Nausea And Vomiting    Can take with zofran  . Morphine And Related Nausea And Vomiting  . Percocet [  Oxycodone-Acetaminophen] Nausea And Vomiting    Prior to Admission medications   Medication Sig Start Date End Date Taking? Authorizing Provider  benazepril (LOTENSIN) 20 MG tablet Take 20 mg by mouth every morning.   Yes [provider]  CALCIUM PO Take 1 tablet by mouth 2 (two) times a week.   Yes [provider]  Cholecalciferol (VITAMIN D3) 2000 UNITS capsule Take 4,000 Units by mouth daily with lunch.   Yes [provider]  ELIQUIS 5 MG TABS tablet TAKE 1 TABLET BY MOUTH TWICE A DAY Patient taking differently: Take 5  mg by mouth 2 (two) times daily. 03/08/20  Yes Ennever, Rudell Cobb, MD  LANTUS SOLOSTAR 100 UNIT/ML Solostar Pen Inject 24 Units into the skin at bedtime. 04/21/19  Yes [provider]  Magnesium 250 MG TABS Take 250 mg by mouth daily with lunch.   Yes [provider]  Misc Natural Products (BLOOD SUGAR BALANCE) TABS Take 1 tablet by mouth in the morning. Glucofort (blood sugar support formula )   Yes [provider]  niacin 500 MG tablet Take 500 mg by mouth daily with lunch.   Yes [provider]  NON FORMULARY Take 80,000 Units by mouth daily with lunch. Serrapeptase   Yes [provider]  OVER THE COUNTER MEDICATION Take 28.3 g by mouth 2 (two) times a week. Carbon-60 (2 tablespoons twice weekly)   Yes [provider]  propranolol ER (INDERAL LA) 80 MG 24 hr capsule Take 80 mg by mouth in the morning. 05/31/20  Yes [provider]  thiamine (VITAMIN B-1) 100 MG tablet Take 200 mg by mouth daily with lunch.   Yes [provider]  vitamin B-12 (CYANOCOBALAMIN) 1000 MCG tablet Take 1,000 mcg by mouth 2 (two) times a week.   Yes [provider]  vitamin C (ASCORBIC ACID) 500 MG tablet Take 500 mg by mouth 2 (two) times a week.   Yes [provider]  cetirizine (ZYRTEC ALLERGY) 10 MG tablet Take 1 tablet (10 mg total) by mouth daily. Patient not taking: Reported on 07/06/2020 05/02/20 06/01/20  Suella Broad A, PA-C  famotidine (PEPCID) 20 MG tablet Take 1 tablet (20 mg total) by mouth 2 (two) times daily. Patient not taking: No sig reported 05/02/20   Suella Broad A, PA-C  glucose 4 GM chewable tablet Chew 16 g by mouth as needed for low blood sugar.    [provider]  hydrOXYzine (ATARAX/VISTARIL) 25 MG tablet Take 1 tablet (25 mg total) by mouth every 6 (six) hours as needed for itching. Patient not taking: No sig reported 05/02/20   Suella Broad A, PA-C  NEOMYCIN-POLYMYXIN-HYDROCORTISONE (CORTISPORIN) 1 %  SOLN OTIC solution Place 3 drops into the left ear 4 (four) times daily. Patient taking differently: Place 3 drops into the left ear daily as needed (ear pain/allergies). 08/15/17   [name], MD  PREMARIN vaginal cream PLACE 1 APPLICATORFUL VAGINALLY 2 TIMES A WEEK. MONDAY AND WEDNESDAY OR THURSDAY. Patient taking differently: Place 1 Applicatorful vaginally 2 (two) times a week. Mondays & Thursdays 06/07/20   [name], MD  triamcinolone (KENALOG) 0.1 % Apply 1 application topically 2 (two) times daily. Patient not taking: No sig reported 05/02/20   Tacy Learn, PA-C    Social History   Socioeconomic History  . Marital status: Married    Spouse name: Not on file  . Number of children: Not on file  . Years of education: Not on  file  . Highest education level: Not on file  Occupational History  . Not on file  Tobacco Use  . Smoking status: Never Smoker  . Smokeless tobacco: Never Used  . Tobacco comment: never used tobacco  Vaping Use  . Vaping Use: Never used  Substance and Sexual Activity  . Alcohol use: No    Alcohol/week: 0.0 standard drinks  . Drug use: No  . Sexual activity: Yes  Other Topics Concern  . Not on file  Social History Narrative  . Not on file   Social Determinants of Health   Financial Resource Strain: Not on file  Food Insecurity: Not on file  Transportation Needs: Not on file  Physical Activity: Not on file  Stress: Not on file  Social Connections: Not on file  Intimate Partner Violence: Not on file   No family history on file.  ROS:  No complaints today  Physical Examination  Vitals:   07/19/20 0707  BP: (!) 164/85  Pulse: 65  Temp: 98.5 F (36.9 C)  SpO2: 98%   Body mass index is 24.8 kg/m.  General: nad HENT: WNL, normocephalic Pulmonary: normal non-labored breathing, without Rales, rhonchi,  wheezing Cardiac: rrr Abdomen:  soft, NT/ND, no masses Musculoskeletal: no muscle wasting or atrophy  Neurologic: A&O X  3; Appropriate Affect ; SENSATION: normal; MOTOR FUNCTION:  moving all extremities equally. Speech is fluent/normal   CBC    Component Value Date/Time   WBC 8.4 07/16/2020 1040   RBC 4.49 07/16/2020 1040   HGB 15.1 (H) 07/16/2020 1040   HGB 14.9 06/03/2020 0934   HGB 14.9 04/04/2017 1139   HGB 14.5 08/02/2015 1009   HCT 44.6 07/16/2020 1040   HCT 43.3 04/04/2017 1139   HCT 41.1 08/02/2015 1009   PLT 303 07/16/2020 1040   PLT 290 06/03/2020 0934   PLT 330 04/04/2017 1139   PLT 287 08/02/2015 1009   MCV 99.3 07/16/2020 1040   MCV 97 04/04/2017 1139   MCV 96.7 08/02/2015 1009   MCH 33.6 07/16/2020 1040   MCHC 33.9 07/16/2020 1040   RDW 12.6 07/16/2020 1040   RDW 13.3 04/04/2017 1139   RDW 13.1 08/02/2015 1009   LYMPHSABS 1.6 06/03/2020 0934   LYMPHSABS 2.1 04/04/2017 1139   LYMPHSABS 1.8 08/02/2015 1009   MONOABS 0.8 06/03/2020 0934   MONOABS 0.8 08/02/2015 1009   EOSABS 0.6 (H) 06/03/2020 0934   EOSABS 0.2 04/04/2017 1139   BASOSABS 0.1 06/03/2020 0934   BASOSABS 0.1 04/04/2017 1139   BASOSABS 0.1 08/02/2015 1009    BMET    Component Value Date/Time   NA 142 07/16/2020 1040   NA 144 04/04/2017 1139   NA 140 09/07/2016 0946   K 4.4 07/16/2020 1040   K 3.5 04/04/2017 1139   K 4.1 09/07/2016 0946   CL 104 07/16/2020 1040   CL 103 04/04/2017 1139   CO2 30 07/16/2020 1040   CO2 33 04/04/2017 1139   CO2 28 09/07/2016 0946   GLUCOSE 117 (H) 07/16/2020 1040   GLUCOSE 107 04/04/2017 1139   BUN 21 07/16/2020 1040   BUN 15 04/04/2017 1139   BUN 22.6 09/07/2016 0946   CREATININE 0.56 07/16/2020 1040   CREATININE 0.77 06/03/2020 0934   CREATININE 0.9 04/04/2017 1139   CREATININE 0.8 09/07/2016 0946   CALCIUM 9.6 07/16/2020 1040   CALCIUM 9.5 04/04/2017 1139   CALCIUM 9.6 09/07/2016 0946   GFRNONAA >60 07/16/2020 1040   GFRNONAA >60 06/03/2020 0934   GFRAA >  60 01/11/2020 1646   GFRAA >60 08/13/2019 1350    COAGS: Lab Results  Component Value Date   INR 1.5  (H) 07/16/2020   INR 1.4 (H) 02/26/2019   INR 1.3 (H) 01/08/2019   PROTIME 32.4 (H) 04/04/2017   PROTIME 31.2 (H) 02/16/2017   PROTIME 26.4 (H) 01/05/2017       ASSESSMENT/PLAN: This is a 75 y.o. female with history of multiple dvt now indicated for right tka. Plan ivc filter placement today. Risks, benefits and alternatives have been removed. Will plan to remove when she has recovered from knee surgery without plans for additional procedures.   Wendal Wilkie C. Donzetta Matters, MD [facility] Office: [phone] Pager: [phone]

## 2020-07-19 NOTE — Progress Notes (Addendum)
Per Dr Donzetta Matters client may ambulate after 2hrs bedrest. Up and walked and tolerated well; right groin stable, no bleeding or hematoma

## 2020-07-20 LAB — SARS CORONAVIRUS 2 (TAT 6-24 HRS): SARS Coronavirus 2: NEGATIVE

## 2020-07-20 MED FILL — Lidocaine HCl Local Preservative Free (PF) Inj 1%: INTRAMUSCULAR | Qty: 30 | Status: AC

## 2020-07-20 NOTE — Anesthesia Preprocedure Evaluation (Addendum)
Anesthesia Evaluation  Patient identified by MRN, date of birth, ID band Patient awake    Reviewed: Allergy & Precautions, NPO status , Patient's Chart, lab work & pertinent test results  History of Anesthesia Complications (+) PONV and history of anesthetic complications  Airway Mallampati: II  TM Distance: >3 FB Neck ROM: Full    Dental no notable dental hx.    Pulmonary asthma ,    Pulmonary exam normal        Cardiovascular hypertension, Pt. on medications Normal cardiovascular exam     Neuro/Psych TIAnegative psych ROS   GI/Hepatic Neg liver ROS, GERD  Medicated and Controlled,  Endo/Other  diabetes, Type 2, Insulin Dependent  Renal/GU negative Renal ROS  negative genitourinary   Musculoskeletal  (+) Arthritis , Fibromyalgia -  Abdominal   Peds  Hematology negative hematology ROS (+)   Anesthesia Other Findings Day of surgery medications reviewed with patient.  Reproductive/Obstetrics negative OB ROS                            Anesthesia Physical Anesthesia Plan  ASA: II  Anesthesia Plan: General   Post-op Pain Management:    Induction: Intravenous  PONV Risk Score and Plan: 4 or greater and Treatment may vary due to age or medical condition, Ondansetron, Dexamethasone and Midazolam  Airway Management Planned: LMA  Additional Equipment: None  Intra-op Plan:   Post-operative Plan: Extubation in OR  Informed Consent: I have reviewed the patients History and Physical, chart, labs and discussed the procedure including the risks, benefits and alternatives for the proposed anesthesia with the patient or authorized representative who has indicated his/her understanding and acceptance.     Dental advisory given  Plan Discussed with: CRNA  Anesthesia Plan Comments: (Discussed lidocaine allergy extensively. Patient describes office procedure many years ago where she received  local anesthetic for superficial procedure (she believes this was lidocaine) and approximately 30 minutes post procedure, she developed "throat swelling and was unable to talk or breathe well". She describes being given another medication which improved her symptoms quickly. Due to possible anaphylaxis to lidocaine, her anesthetic plan was discussed extensively with Dr. Tonita Cong. Plan to avoid administration of amide local anesthetics. Will administer intrathecal morphine for postop pain management. Surgical infiltration will be performed with tetracaine. Orders placed for continuous pulse oximetry for 12 hours post morphine administration with no other narcotics to be given during this time. Daiva Huge, MD)       Anesthesia Quick Evaluation

## 2020-07-21 ENCOUNTER — Inpatient Hospital Stay (HOSPITAL_COMMUNITY)
Admission: RE | Admit: 2020-07-21 | Discharge: 2020-07-25 | DRG: 470 | Disposition: A | Discharge status: Home Health | Payer: PPO | Attending: Specialist | Admitting: Specialist

## 2020-07-21 ENCOUNTER — Inpatient Hospital Stay (HOSPITAL_COMMUNITY): Payer: PPO | Admitting: Physician Assistant

## 2020-07-21 ENCOUNTER — Other Ambulatory Visit: Payer: Self-pay

## 2020-07-21 ENCOUNTER — Encounter (HOSPITAL_COMMUNITY)
Admission: RE | Disposition: A | Discharge status: Home / Self Care | Payer: Self-pay | Source: Home / Self Care | Attending: Specialist

## 2020-07-21 ENCOUNTER — Inpatient Hospital Stay (HOSPITAL_COMMUNITY): Payer: PPO

## 2020-07-21 ENCOUNTER — Encounter (HOSPITAL_COMMUNITY): Payer: Self-pay | Admitting: Specialist

## 2020-07-21 DIAGNOSIS — Z880 Allergy status to penicillin: Secondary | ICD-10-CM

## 2020-07-21 DIAGNOSIS — Z8673 Personal history of transient ischemic attack (TIA), and cerebral infarction without residual deficits: Secondary | ICD-10-CM

## 2020-07-21 DIAGNOSIS — Z90710 Acquired absence of both cervix and uterus: Secondary | ICD-10-CM | POA: Diagnosis not present

## 2020-07-21 DIAGNOSIS — M1711 Unilateral primary osteoarthritis, right knee: Principal | ICD-10-CM | POA: Diagnosis present

## 2020-07-21 DIAGNOSIS — Z8701 Personal history of pneumonia (recurrent): Secondary | ICD-10-CM

## 2020-07-21 DIAGNOSIS — I10 Essential (primary) hypertension: Secondary | ICD-10-CM | POA: Diagnosis present

## 2020-07-21 DIAGNOSIS — Z886 Allergy status to analgesic agent status: Secondary | ICD-10-CM

## 2020-07-21 DIAGNOSIS — X58XXXA Exposure to other specified factors, initial encounter: Secondary | ICD-10-CM | POA: Diagnosis present

## 2020-07-21 DIAGNOSIS — Z885 Allergy status to narcotic agent status: Secondary | ICD-10-CM

## 2020-07-21 DIAGNOSIS — Z884 Allergy status to anesthetic agent status: Secondary | ICD-10-CM | POA: Diagnosis not present

## 2020-07-21 DIAGNOSIS — K219 Gastro-esophageal reflux disease without esophagitis: Secondary | ICD-10-CM | POA: Diagnosis present

## 2020-07-21 DIAGNOSIS — Z20822 Contact with and (suspected) exposure to covid-19: Secondary | ICD-10-CM | POA: Diagnosis present

## 2020-07-21 DIAGNOSIS — J45909 Unspecified asthma, uncomplicated: Secondary | ICD-10-CM | POA: Diagnosis present

## 2020-07-21 DIAGNOSIS — Z888 Allergy status to other drugs, medicaments and biological substances status: Secondary | ICD-10-CM

## 2020-07-21 DIAGNOSIS — Z87892 Personal history of anaphylaxis: Secondary | ICD-10-CM

## 2020-07-21 DIAGNOSIS — Z86718 Personal history of other venous thrombosis and embolism: Secondary | ICD-10-CM

## 2020-07-21 DIAGNOSIS — K7581 Nonalcoholic steatohepatitis (NASH): Secondary | ICD-10-CM | POA: Diagnosis present

## 2020-07-21 DIAGNOSIS — M84351A Stress fracture, right femur, initial encounter for fracture: Secondary | ICD-10-CM | POA: Diagnosis present

## 2020-07-21 DIAGNOSIS — M797 Fibromyalgia: Secondary | ICD-10-CM | POA: Diagnosis present

## 2020-07-21 DIAGNOSIS — E119 Type 2 diabetes mellitus without complications: Secondary | ICD-10-CM | POA: Diagnosis present

## 2020-07-21 DIAGNOSIS — Z96659 Presence of unspecified artificial knee joint: Secondary | ICD-10-CM

## 2020-07-21 DIAGNOSIS — Z794 Long term (current) use of insulin: Secondary | ICD-10-CM

## 2020-07-21 DIAGNOSIS — D6861 Antiphospholipid syndrome: Secondary | ICD-10-CM | POA: Diagnosis present

## 2020-07-21 HISTORY — PX: TOTAL KNEE ARTHROPLASTY: SHX125

## 2020-07-21 LAB — HEMOGLOBIN A1C
Hgb A1c MFr Bld: 7.3 % — ABNORMAL HIGH (ref 4.8–5.6)
Mean Plasma Glucose: 162.81 mg/dL

## 2020-07-21 LAB — GLUCOSE, CAPILLARY
Glucose-Capillary: 104 mg/dL — ABNORMAL HIGH (ref 70–99)
Glucose-Capillary: 115 mg/dL — ABNORMAL HIGH (ref 70–99)
Glucose-Capillary: 125 mg/dL — ABNORMAL HIGH (ref 70–99)
Glucose-Capillary: 171 mg/dL — ABNORMAL HIGH (ref 70–99)
Glucose-Capillary: 213 mg/dL — ABNORMAL HIGH (ref 70–99)

## 2020-07-21 SURGERY — ARTHROPLASTY, KNEE, TOTAL
Anesthesia: Spinal | Site: Knee | Laterality: Right

## 2020-07-21 MED ORDER — METOCLOPRAMIDE HCL 5 MG/ML IJ SOLN
5.0000 mg | Freq: Three times a day (TID) | INTRAMUSCULAR | Status: DC | PRN
Start: 2020-07-21 — End: 2020-07-25
  Administered 2020-07-21: 10 mg via INTRAVENOUS
  Filled 2020-07-21: qty 2

## 2020-07-21 MED ORDER — NALBUPHINE HCL 20 MG/ML IJ SOLN
5.0000 mg | Freq: Once | INTRAMUSCULAR | Status: DC | PRN
  Filled 2020-07-21: qty 0.25

## 2020-07-21 MED ORDER — SODIUM CHLORIDE 0.9% FLUSH: 3.0000 mL | INTRAVENOUS | Status: DC | PRN

## 2020-07-21 MED ORDER — EPHEDRINE SULFATE-NACL 50-0.9 MG/10ML-% IV SOSY
PREFILLED_SYRINGE | INTRAVENOUS | Status: DC | PRN
  Administered 2020-07-21: 5 mg via INTRAVENOUS
  Administered 2020-07-21: 10 mg via INTRAVENOUS

## 2020-07-21 MED ORDER — POLYETHYLENE GLYCOL 3350 17 G PO PACK: 17.0000 g | PACK | Freq: Every day | ORAL | Status: DC | PRN

## 2020-07-21 MED ORDER — LACTATED RINGERS IV SOLN: INTRAVENOUS | Status: DC

## 2020-07-21 MED ORDER — MAGNESIUM OXIDE 400 (241.3 MG) MG PO TABS
400.0000 mg | ORAL_TABLET | Freq: Every day | ORAL | Status: DC
  Administered 2020-07-24: 400 mg via ORAL
  Filled 2020-07-21: qty 1

## 2020-07-21 MED ORDER — SODIUM CHLORIDE 0.9% FLUSH
INTRAVENOUS | Status: DC | PRN
  Administered 2020-07-21: 50 mL

## 2020-07-21 MED ORDER — RISAQUAD PO CAPS
1.0000 | ORAL_CAPSULE | Freq: Every day | ORAL | Status: DC
  Administered 2020-07-24 – 2020-07-25 (×2): 1 via ORAL
  Filled 2020-07-21 (×2): qty 1

## 2020-07-21 MED ORDER — IRRISEPT - 450ML BOTTLE WITH 0.05% CHG IN STERILE WATER, USP 99.95% OPTIME
TOPICAL | Status: DC | PRN
  Administered 2020-07-21: 450 mL via TOPICAL

## 2020-07-21 MED ORDER — MENTHOL 3 MG MT LOZG
1.0000 | LOZENGE | OROMUCOSAL | Status: DC | PRN
  Administered 2020-07-23: 3 mg via ORAL
  Filled 2020-07-21: qty 9

## 2020-07-21 MED ORDER — HYDROMORPHONE HCL 1 MG/ML IJ SOLN
INTRAMUSCULAR | Status: DC | PRN
  Administered 2020-07-21 (×2): .5 mg via INTRAVENOUS

## 2020-07-21 MED ORDER — ONDANSETRON HCL 4 MG/2ML IJ SOLN
INTRAMUSCULAR | Status: DC | PRN
  Administered 2020-07-21: 4 mg via INTRAVENOUS

## 2020-07-21 MED ORDER — PROPOFOL 10 MG/ML IV BOLUS
INTRAVENOUS | Status: DC | PRN
  Administered 2020-07-21: 20 mg via INTRAVENOUS
  Administered 2020-07-21: 150 mg via INTRAVENOUS

## 2020-07-21 MED ORDER — CEFAZOLIN SODIUM-DEXTROSE 1-4 GM/50ML-% IV SOLN
1.0000 g | Freq: Four times a day (QID) | INTRAVENOUS | Status: AC
Start: 2020-07-21 — End: 2020-07-22
  Administered 2020-07-21 (×2): 1 g via INTRAVENOUS
  Filled 2020-07-21 (×2): qty 50

## 2020-07-21 MED ORDER — INSULIN ASPART 100 UNIT/ML ~~LOC~~ SOLN
0.0000 [IU] | Freq: Three times a day (TID) | SUBCUTANEOUS | Status: DC
  Administered 2020-07-21 – 2020-07-22 (×3): 3 [IU] via SUBCUTANEOUS
  Administered 2020-07-22: 5 [IU] via SUBCUTANEOUS
  Administered 2020-07-23 (×3): 3 [IU] via SUBCUTANEOUS
  Administered 2020-07-24: 2 [IU] via SUBCUTANEOUS

## 2020-07-21 MED ORDER — APIXABAN 2.5 MG PO TABS
2.5000 mg | ORAL_TABLET | Freq: Two times a day (BID) | ORAL | Status: DC
  Administered 2020-07-22 – 2020-07-25 (×7): 2.5 mg via ORAL
  Filled 2020-07-21 (×7): qty 1

## 2020-07-21 MED ORDER — NALBUPHINE HCL 20 MG/ML IJ SOLN
5.0000 mg | INTRAMUSCULAR | Status: DC | PRN
  Filled 2020-07-21: qty 0.25

## 2020-07-21 MED ORDER — BUPIVACAINE LIPOSOME 1.3 % IJ SUSP: 20.0000 mL | Freq: Once | INTRAMUSCULAR | Status: DC

## 2020-07-21 MED ORDER — 0.9 % SODIUM CHLORIDE (POUR BTL) OPTIME
TOPICAL | Status: DC | PRN
  Administered 2020-07-21: 1000 mL

## 2020-07-21 MED ORDER — CHLORHEXIDINE GLUCONATE 0.12 % MT SOLN
15.0000 mL | Freq: Once | OROMUCOSAL | Status: AC
  Administered 2020-07-21: 15 mL via OROMUCOSAL

## 2020-07-21 MED ORDER — PROPOFOL 500 MG/50ML IV EMUL
INTRAVENOUS | Status: AC
  Filled 2020-07-21: qty 50

## 2020-07-21 MED ORDER — MIDAZOLAM HCL 5 MG/5ML IJ SOLN
INTRAMUSCULAR | Status: DC | PRN
  Administered 2020-07-21: 1 mg via INTRAVENOUS

## 2020-07-21 MED ORDER — PROPRANOLOL HCL ER 80 MG PO CP24
80.0000 mg | ORAL_CAPSULE | Freq: Every morning | ORAL | Status: DC
  Administered 2020-07-22 – 2020-07-25 (×4): 80 mg via ORAL
  Filled 2020-07-21 (×4): qty 1

## 2020-07-21 MED ORDER — ESTROGENS, CONJUGATED 0.625 MG/GM VA CREA
1.0000 | TOPICAL_CREAM | VAGINAL | Status: DC
  Filled 2020-07-21: qty 30

## 2020-07-21 MED ORDER — GLUCOSE 4 G PO CHEW: 16.0000 g | CHEWABLE_TABLET | ORAL | Status: DC | PRN

## 2020-07-21 MED ORDER — MORPHINE SULFATE (PF) 0.5 MG/ML IJ SOLN
INTRAMUSCULAR | Status: DC | PRN
  Administered 2020-07-21: 250 ug via INTRATHECAL

## 2020-07-21 MED ORDER — NIACIN 500 MG PO TABS
500.0000 mg | ORAL_TABLET | Freq: Every day | ORAL | Status: DC
  Administered 2020-07-24: 500 mg via ORAL
  Filled 2020-07-21 (×5): qty 1

## 2020-07-21 MED ORDER — SODIUM CHLORIDE 0.9 % IR SOLN
Status: DC | PRN
  Administered 2020-07-21: 1000 mL

## 2020-07-21 MED ORDER — ONDANSETRON HCL 4 MG PO TABS
4.0000 mg | ORAL_TABLET | Freq: Four times a day (QID) | ORAL | Status: DC | PRN
Start: 2020-07-21 — End: 2020-07-25

## 2020-07-21 MED ORDER — NALBUPHINE HCL 20 MG/ML IJ SOLN
5.0000 mg | INTRAMUSCULAR | Status: DC | PRN
Start: 2020-07-21 — End: 2020-07-21
  Filled 2020-07-21: qty 0.25

## 2020-07-21 MED ORDER — CHLOROPROCAINE HCL 50 MG/5ML IT SOLN
5.0000 mL | Freq: Once | INTRATHECAL | Status: AC
  Administered 2020-07-21: 5 mL via INTRATHECAL
  Filled 2020-07-21: qty 5

## 2020-07-21 MED ORDER — FENTANYL CITRATE (PF) 100 MCG/2ML IJ SOLN: 25.0000 ug | INTRAMUSCULAR | Status: DC | PRN

## 2020-07-21 MED ORDER — DOCUSATE SODIUM 100 MG PO CAPS: 100.0000 mg | ORAL_CAPSULE | Freq: Two times a day (BID) | ORAL | 1 refills | Status: DC | PRN

## 2020-07-21 MED ORDER — METOCLOPRAMIDE HCL 5 MG PO TABS: 5.0000 mg | ORAL_TABLET | Freq: Three times a day (TID) | ORAL | Status: DC | PRN

## 2020-07-21 MED ORDER — PROMETHAZINE HCL 25 MG/ML IJ SOLN: 6.2500 mg | INTRAMUSCULAR | Status: DC | PRN

## 2020-07-21 MED ORDER — PROPOFOL 500 MG/50ML IV EMUL
INTRAVENOUS | Status: DC | PRN
  Administered 2020-07-21: 25 ug/kg/min via INTRAVENOUS

## 2020-07-21 MED ORDER — ONDANSETRON HCL 4 MG/2ML IJ SOLN: 4.0000 mg | Freq: Three times a day (TID) | INTRAMUSCULAR | Status: DC | PRN

## 2020-07-21 MED ORDER — ACETAMINOPHEN 10 MG/ML IV SOLN
1000.0000 mg | INTRAVENOUS | Status: AC
  Administered 2020-07-21: 1000 mg via INTRAVENOUS
  Filled 2020-07-21: qty 100

## 2020-07-21 MED ORDER — POLYETHYLENE GLYCOL 3350 17 G PO PACK
17.0000 g | PACK | Freq: Every day | ORAL | 0 refills | Status: DC
Start: 2020-07-21 — End: 2021-01-19

## 2020-07-21 MED ORDER — ONDANSETRON HCL 4 MG/2ML IJ SOLN
4.0000 mg | Freq: Four times a day (QID) | INTRAMUSCULAR | Status: DC | PRN
  Administered 2020-07-21 – 2020-07-24 (×7): 4 mg via INTRAVENOUS
  Filled 2020-07-21 (×7): qty 2

## 2020-07-21 MED ORDER — BISACODYL 5 MG PO TBEC: 5.0000 mg | DELAYED_RELEASE_TABLET | Freq: Every day | ORAL | Status: DC | PRN

## 2020-07-21 MED ORDER — DOCUSATE SODIUM 100 MG PO CAPS
100.0000 mg | ORAL_CAPSULE | Freq: Two times a day (BID) | ORAL | Status: DC
  Administered 2020-07-21 – 2020-07-25 (×8): 100 mg via ORAL
  Filled 2020-07-21 (×8): qty 1

## 2020-07-21 MED ORDER — DEXAMETHASONE SODIUM PHOSPHATE 10 MG/ML IJ SOLN
INTRAMUSCULAR | Status: DC | PRN
  Administered 2020-07-21: 5 mg via INTRAVENOUS

## 2020-07-21 MED ORDER — ACETAMINOPHEN 500 MG PO TABS
500.0000 mg | ORAL_TABLET | Freq: Four times a day (QID) | ORAL | Status: AC
  Administered 2020-07-21 – 2020-07-22 (×3): 500 mg via ORAL
  Filled 2020-07-21 (×3): qty 1

## 2020-07-21 MED ORDER — ASCORBIC ACID 500 MG PO TABS: 500.0000 mg | ORAL_TABLET | ORAL | Status: DC

## 2020-07-21 MED ORDER — DIPHENHYDRAMINE HCL 50 MG/ML IJ SOLN: 12.5000 mg | Freq: Four times a day (QID) | INTRAMUSCULAR | Status: DC | PRN

## 2020-07-21 MED ORDER — EPHEDRINE 5 MG/ML INJ
INTRAVENOUS | Status: AC
  Filled 2020-07-21: qty 10

## 2020-07-21 MED ORDER — INSULIN GLARGINE 100 UNIT/ML ~~LOC~~ SOLN
24.0000 [IU] | Freq: Every day | SUBCUTANEOUS | Status: DC
  Administered 2020-07-22 – 2020-07-24 (×3): 24 [IU] via SUBCUTANEOUS
  Filled 2020-07-21 (×4): qty 0.24

## 2020-07-21 MED ORDER — NALOXONE HCL 0.4 MG/ML IJ SOLN: 0.4000 mg | INTRAMUSCULAR | Status: DC | PRN

## 2020-07-21 MED ORDER — BENAZEPRIL HCL 20 MG PO TABS
20.0000 mg | ORAL_TABLET | Freq: Every morning | ORAL | Status: DC
  Administered 2020-07-22 – 2020-07-25 (×3): 20 mg via ORAL
  Filled 2020-07-21 (×3): qty 1

## 2020-07-21 MED ORDER — ORAL CARE MOUTH RINSE: 15.0000 mL | Freq: Once | OROMUCOSAL | Status: AC

## 2020-07-21 MED ORDER — VITAMIN B-12 1000 MCG PO TABS: 1000.0000 ug | ORAL_TABLET | ORAL | Status: DC

## 2020-07-21 MED ORDER — ACETAMINOPHEN 500 MG PO TABS: 1000.0000 mg | ORAL_TABLET | Freq: Once | ORAL | Status: DC

## 2020-07-21 MED ORDER — TRANEXAMIC ACID-NACL 1000-0.7 MG/100ML-% IV SOLN
INTRAVENOUS | Status: AC
  Filled 2020-07-21: qty 100

## 2020-07-21 MED ORDER — MIDAZOLAM HCL 2 MG/2ML IJ SOLN
INTRAMUSCULAR | Status: AC
  Filled 2020-07-21: qty 2

## 2020-07-21 MED ORDER — PHENYLEPHRINE 40 MCG/ML (10ML) SYRINGE FOR IV PUSH (FOR BLOOD PRESSURE SUPPORT)
PREFILLED_SYRINGE | INTRAVENOUS | Status: DC | PRN
  Administered 2020-07-21: 80 ug via INTRAVENOUS

## 2020-07-21 MED ORDER — FENTANYL CITRATE (PF) 100 MCG/2ML IJ SOLN
INTRAMUSCULAR | Status: DC | PRN
  Administered 2020-07-21 (×3): 50 ug via INTRAVENOUS
  Administered 2020-07-21: 25 ug via INTRAVENOUS
  Administered 2020-07-21 (×2): 50 ug via INTRAVENOUS
  Administered 2020-07-21: 25 ug via INTRAVENOUS

## 2020-07-21 MED ORDER — TRANEXAMIC ACID-NACL 1000-0.7 MG/100ML-% IV SOLN
INTRAVENOUS | Status: DC | PRN
  Administered 2020-07-21: 1000 mg via INTRAVENOUS

## 2020-07-21 MED ORDER — MORPHINE SULFATE (PF) 0.5 MG/ML IJ SOLN
200.0000 ug | Freq: Once | INTRAMUSCULAR | Status: DC
  Filled 2020-07-21: qty 10

## 2020-07-21 MED ORDER — DIPHENHYDRAMINE HCL 25 MG PO CAPS: 25.0000 mg | ORAL_CAPSULE | Freq: Four times a day (QID) | ORAL | Status: DC | PRN

## 2020-07-21 MED ORDER — CEFAZOLIN SODIUM-DEXTROSE 2-4 GM/100ML-% IV SOLN
2.0000 g | INTRAVENOUS | Status: AC
  Administered 2020-07-21: 2 g via INTRAVENOUS
  Filled 2020-07-21: qty 100

## 2020-07-21 MED ORDER — FENTANYL CITRATE (PF) 100 MCG/2ML IJ SOLN
INTRAMUSCULAR | Status: AC
  Filled 2020-07-21: qty 2

## 2020-07-21 MED ORDER — NALOXONE HCL 4 MG/10ML IJ SOLN
1.0000 ug/kg/h | INTRAVENOUS | Status: DC | PRN
  Filled 2020-07-21: qty 5

## 2020-07-21 MED ORDER — PHENOL 1.4 % MT LIQD: 1.0000 | OROMUCOSAL | Status: DC | PRN

## 2020-07-21 MED ORDER — BUPIVACAINE-EPINEPHRINE (PF) 0.25% -1:200000 IJ SOLN
INTRAMUSCULAR | Status: AC
  Filled 2020-07-21: qty 30

## 2020-07-21 MED ORDER — MAGNESIUM CITRATE PO SOLN: 1.0000 | Freq: Once | ORAL | Status: DC | PRN

## 2020-07-21 MED ORDER — HYDROMORPHONE HCL 2 MG/ML IJ SOLN
INTRAMUSCULAR | Status: AC
  Filled 2020-07-21: qty 1

## 2020-07-21 MED ORDER — ALUM & MAG HYDROXIDE-SIMETH 200-200-20 MG/5ML PO SUSP: 30.0000 mL | ORAL | Status: DC | PRN

## 2020-07-21 MED ORDER — THIAMINE HCL 100 MG PO TABS
100.0000 mg | ORAL_TABLET | Freq: Every day | ORAL | Status: DC
  Administered 2020-07-24: 100 mg via ORAL
  Filled 2020-07-21: qty 1

## 2020-07-21 MED ORDER — SODIUM CHLORIDE (PF) 0.9 % IJ SOLN
INTRAMUSCULAR | Status: DC | PRN
  Administered 2020-07-21: 50 mL

## 2020-07-21 MED ORDER — PHENYLEPHRINE 40 MCG/ML (10ML) SYRINGE FOR IV PUSH (FOR BLOOD PRESSURE SUPPORT)
PREFILLED_SYRINGE | INTRAVENOUS | Status: AC
  Filled 2020-07-21: qty 10

## 2020-07-21 MED ORDER — DIPHENHYDRAMINE HCL 12.5 MG/5ML PO ELIX
12.5000 mg | ORAL_SOLUTION | ORAL | Status: DC | PRN
Start: 2020-07-21 — End: 2020-07-25
  Administered 2020-07-22: 25 mg via ORAL
  Filled 2020-07-21 (×2): qty 10

## 2020-07-21 MED ORDER — VITAMIN B-1 50 MG PO TABS
200.0000 mg | ORAL_TABLET | Freq: Every day | ORAL | Status: DC
  Filled 2020-07-21: qty 4

## 2020-07-21 MED ORDER — KCL IN DEXTROSE-NACL 20-5-0.45 MEQ/L-%-% IV SOLN
INTRAVENOUS | Status: AC
  Filled 2020-07-21 (×3): qty 1000

## 2020-07-21 MED ORDER — VITAMIN D 25 MCG (1000 UNIT) PO TABS
4000.0000 [IU] | ORAL_TABLET | Freq: Every day | ORAL | Status: DC
  Administered 2020-07-24: 4000 [IU] via ORAL
  Filled 2020-07-21: qty 4

## 2020-07-21 SURGICAL SUPPLY — 78 items
ATTUNE PSFEM RTSZ5 NARCEM KNEE (Femur) ×2 IMPLANT
ATTUNE PSRP INSR SZ5 5 KNEE (Insert) ×2 IMPLANT
BAG DECANTER FOR FLEXI CONT (MISCELLANEOUS) IMPLANT
BAG ZIPLOCK 12X15 (MISCELLANEOUS) ×2 IMPLANT
BASEPLATE TIBIAL ROTATING SZ 4 (Knees) ×2 IMPLANT
BLADE SAW SGTL 11.0X1.19X90.0M (BLADE) ×4 IMPLANT
BLADE SAW SGTL 13.0X1.19X90.0M (BLADE) ×2 IMPLANT
BLADE SURG SZ10 CARB STEEL (BLADE) ×4 IMPLANT
BNDG COHESIVE 4X5 TAN STRL (GAUZE/BANDAGES/DRESSINGS) ×2 IMPLANT
BNDG ELASTIC 4X5.8 VLCR STR LF (GAUZE/BANDAGES/DRESSINGS) ×2 IMPLANT
BNDG ELASTIC 6X5.8 VLCR STR LF (GAUZE/BANDAGES/DRESSINGS) ×2 IMPLANT
CEMENT HV SMART SET (Cement) ×4 IMPLANT
COVER SURGICAL LIGHT HANDLE (MISCELLANEOUS) ×2 IMPLANT
COVER WAND RF STERILE (DRAPES) IMPLANT
CUFF TOURN SGL QUICK 34 (TOURNIQUET CUFF) ×2
CUFF TRNQT CYL 34X4.125X (TOURNIQUET CUFF) ×1 IMPLANT
DECANTER SPIKE VIAL GLASS SM (MISCELLANEOUS) IMPLANT
DRAPE INCISE IOBAN 66X45 STRL (DRAPES) IMPLANT
DRAPE ORTHO SPLIT 77X108 STRL (DRAPES) ×4
DRAPE SHEET LG 3/4 BI-LAMINATE (DRAPES) ×4 IMPLANT
DRAPE SURG ORHT 6 SPLT 77X108 (DRAPES) ×2 IMPLANT
DRAPE U-SHAPE 47X51 STRL (DRAPES) ×2 IMPLANT
DRSG AQUACEL AG ADV 3.5X10 (GAUZE/BANDAGES/DRESSINGS) ×2 IMPLANT
DRSG TEGADERM 4X4.75 (GAUZE/BANDAGES/DRESSINGS) IMPLANT
DURAPREP 26ML APPLICATOR (WOUND CARE) ×2 IMPLANT
ELECT BLADE TIP CTD 4 INCH (ELECTRODE) ×2 IMPLANT
ELECT REM PT RETURN 15FT ADLT (MISCELLANEOUS) ×2 IMPLANT
EVACUATOR 1/8 PVC DRAIN (DRAIN) IMPLANT
GAUZE SPONGE 2X2 8PLY STRL LF (GAUZE/BANDAGES/DRESSINGS) IMPLANT
GLOVE SRG 8 PF TXTR STRL LF DI (GLOVE) ×1 IMPLANT
GLOVE SURG POLYISO LF SZ7.5 (GLOVE) ×4 IMPLANT
GLOVE SURG POLYISO LF SZ8 (GLOVE) ×4 IMPLANT
GLOVE SURG UNDER POLY LF SZ7.5 (GLOVE) ×2 IMPLANT
GLOVE SURG UNDER POLY LF SZ8 (GLOVE) ×2
GOWN STRL REUS W/TWL XL LVL3 (GOWN DISPOSABLE) ×4 IMPLANT
HANDPIECE INTERPULSE COAX TIP (DISPOSABLE) ×2
HEMOSTAT SPONGE AVITENE ULTRA (HEMOSTASIS) IMPLANT
HOLDER FOLEY CATH W/STRAP (MISCELLANEOUS) ×2 IMPLANT
IMMOBILIZER KNEE 20 (SOFTGOODS) ×2
IMMOBILIZER KNEE 20 THIGH 36 (SOFTGOODS) ×1 IMPLANT
JET LAVAGE IRRISEPT WOUND (IRRIGATION / IRRIGATOR) ×2
KIT TURNOVER KIT A (KITS) ×2 IMPLANT
LAVAGE JET IRRISEPT WOUND (IRRIGATION / IRRIGATOR) ×1 IMPLANT
MANIFOLD NEPTUNE II (INSTRUMENTS) ×2 IMPLANT
NDL SAFETY ECLIPSE 18X1.5 (NEEDLE) IMPLANT
NEEDLE HYPO 18GX1.5 SHARP (NEEDLE)
NS IRRIG 1000ML POUR BTL (IV SOLUTION) IMPLANT
PACK TOTAL KNEE CUSTOM (KITS) ×2 IMPLANT
PATELLA MEDIAL ATTUN 35MM KNEE (Knees) ×2 IMPLANT
PIN FIX SIGMA LCS THRD HI (PIN) ×2 IMPLANT
PIN STEINMAN FIXATION KNEE (PIN) ×2 IMPLANT
PROTECTOR NERVE ULNAR (MISCELLANEOUS) ×2 IMPLANT
SAW OSC TIP CART 19.5X105X1.3 (SAW) ×2 IMPLANT
SEALER BIPOLAR AQUA 6.0 (INSTRUMENTS) ×2 IMPLANT
SET HNDPC FAN SPRY TIP SCT (DISPOSABLE) ×1 IMPLANT
SPONGE GAUZE 2X2 STER 10/PKG (GAUZE/BANDAGES/DRESSINGS)
SPONGE SURGIFOAM ABS GEL 100 (HEMOSTASIS) IMPLANT
STAPLER VISISTAT (STAPLE) IMPLANT
STRIP CLOSURE SKIN 1/2X4 (GAUZE/BANDAGES/DRESSINGS) ×4 IMPLANT
SUT BONE WAX W31G (SUTURE) ×2 IMPLANT
SUT MNCRL AB 4-0 PS2 18 (SUTURE) ×2 IMPLANT
SUT STRATAFIX 0 PDS 27 VIOLET (SUTURE) ×2
SUT VIC AB 1 CT1 27 (SUTURE) ×4
SUT VIC AB 1 CT1 27XBRD ANTBC (SUTURE) ×2 IMPLANT
SUT VIC AB 1 CT1 36 (SUTURE) ×6 IMPLANT
SUT VIC AB 1 CTX 36 (SUTURE)
SUT VIC AB 1 CTX36XBRD ANBCTR (SUTURE) IMPLANT
SUT VIC AB 2-0 CT1 27 (SUTURE) ×6
SUT VIC AB 2-0 CT1 TAPERPNT 27 (SUTURE) ×3 IMPLANT
SUTURE STRATFX 0 PDS 27 VIOLET (SUTURE) ×1 IMPLANT
SYR 3ML LL SCALE MARK (SYRINGE) IMPLANT
SYR 50ML LL SCALE MARK (SYRINGE) ×2 IMPLANT
TOWER CARTRIDGE SMART MIX (DISPOSABLE) ×2 IMPLANT
TRAY FOLEY MTR SLVR 14FR STAT (SET/KITS/TRAYS/PACK) ×2 IMPLANT
TRAY FOLEY MTR SLVR 16FR STAT (SET/KITS/TRAYS/PACK) IMPLANT
WATER STERILE IRR 1000ML POUR (IV SOLUTION) ×4 IMPLANT
WIPE CHG CHLORHEXIDINE 2% (PERSONAL CARE ITEMS) ×2 IMPLANT
WRAP KNEE MAXI GEL POST OP (GAUZE/BANDAGES/DRESSINGS) ×2 IMPLANT

## 2020-07-21 NOTE — Anesthesia Procedure Notes (Signed)
Spinal  Patient location during procedure: OR Start time: 07/21/2020 9:11 AM End time: 07/21/2020 9:13 AM Reason for block: surgical anesthesia Staffing Performed: anesthesiologist  Anesthesiologist: Brennan Bailey, MD Preanesthetic Checklist Completed: patient identified, IV checked, risks and benefits discussed, surgical consent, monitors and equipment checked, pre-op evaluation and timeout performed Spinal Block Patient position: sitting Prep: DuraPrep and site prepped and draped Patient monitoring: continuous pulse ox, blood pressure and heart rate Approach: midline Location: L3-4 Injection technique: single-shot Needle Needle type: Pencan  Needle gauge: 24 G Needle length: 9 cm Assessment Events: CSF return Additional Notes Risks, benefits, and alternative discussed. Patient gave consent to procedure. Prepped and draped in sitting position. Patient sedated but responsive to voice. Skin injected with chloroprocaine (due to lidocaine allergy). Clear CSF obtained after one needle redirection. Duramorph 241mg added to 1cc sterile saline for intrathecal injection. Positive terminal aspiration. No pain or paraesthesias with injection. Patient tolerated procedure well. Vital signs stable. KTawny Asal MD

## 2020-07-21 NOTE — Anesthesia Postprocedure Evaluation (Signed)
Anesthesia Post Note  Patient: Laurie Green  Procedure(s) Performed: TOTAL KNEE ARTHROPLASTY (Right Knee)     Patient location during evaluation: PACU Anesthesia Type: General Level of consciousness: awake and alert and oriented Pain management: pain level controlled Vital Signs Assessment: post-procedure vital signs reviewed and stable Respiratory status: spontaneous breathing, nonlabored ventilation and respiratory function stable Cardiovascular status: blood pressure returned to baseline Postop Assessment: no apparent nausea or vomiting Anesthetic complications: no   No complications documented.  Last Vitals:  Vitals:   07/21/20 1514 07/21/20 1751  BP: 120/61 125/67  Pulse: 63 64  Resp: 12 16  Temp: 36.7 C 36.6 C  SpO2: 95% 97%    Last Pain:  Vitals:   07/21/20 1751  TempSrc: Oral  PainSc:    Pain Goal:                   Brennan Bailey

## 2020-07-21 NOTE — Anesthesia Procedure Notes (Signed)
Procedure Name: LMA Insertion Date/Time: 07/21/2020 9:17 AM Performed by: Claudia Desanctis, CRNA Pre-anesthesia Checklist: Emergency Drugs available, Patient identified, Suction available and Patient being monitored Patient Re-evaluated:Patient Re-evaluated prior to induction Oxygen Delivery Method: Circle system utilized Preoxygenation: Pre-oxygenation with 100% oxygen Induction Type: IV induction Ventilation: Mask ventilation without difficulty LMA: LMA inserted LMA Size: 4.0 Number of attempts: 1 Placement Confirmation: positive ETCO2 and breath sounds checked- equal and bilateral Tube secured with: Tape Dental Injury: Teeth and Oropharynx as per pre-operative assessment

## 2020-07-21 NOTE — Transfer of Care (Signed)
Immediate Anesthesia Transfer of Care Note  Patient: Laurie Green  Procedure(s) Performed: TOTAL KNEE ARTHROPLASTY (Right Knee)  Patient Location: PACU  Anesthesia Type:GA combined with regional for post-op pain  Level of Consciousness: awake and patient cooperative  Airway & Oxygen Therapy: Patient Spontanous Breathing and Patient connected to face mask  Post-op Assessment: Report given to RN and Post -op Vital signs reviewed and stable  Post vital signs: Reviewed and stable  Last Vitals:  Vitals Value Taken Time  BP    Temp    Pulse 72 07/21/20 1144  Resp 17 07/21/20 1144  SpO2 97 % 07/21/20 1144  Vitals shown include unvalidated device data.  Last Pain:  Vitals:   07/21/20 0654  TempSrc: Oral  PainSc:          Complications: No complications documented.

## 2020-07-21 NOTE — Plan of Care (Signed)
  Problem: Pain Managment: Goal: General experience of comfort will improve Outcome: Progressing   Problem: Coping: Goal: Level of anxiety will decrease Outcome: Progressing   

## 2020-07-21 NOTE — Interval H&P Note (Signed)
History and Physical Interval Note:  07/21/2020 8:13 AM  Laurie Green  has presented today for surgery, with the diagnosis of Degenerative joint disease right knee.  The various methods of treatment have been discussed with the patient and family. After consideration of risks, benefits and other options for treatment, the patient has consented to  Procedure(s) with comments: TOTAL KNEE ARTHROPLASTY (Right) - 2.5 hrs as a surgical intervention.  The patient's history has been reviewed, patient examined, no change in status, stable for surgery.  I have reviewed the patient's chart and labs.  Questions were answered to the patient's satisfaction.     Laurie Green

## 2020-07-21 NOTE — Progress Notes (Signed)
PT Cancellation Note  Patient Details Name: TANELLE LANZO MRN: [id number] DOB: 1946/01/30   Cancelled Treatment:    Reason Eval/Treat Not Completed: Other (comment);Fatigue/lethargy limiting ability to participate. Patient limited by lethargy and c/o nausea dry heaving in bed. Emesis bag and cool wash cloths provided and RN notified. Will follow up at later date/time as pt able and schedule allows.  Verner Mould, DPT Acute Rehabilitation Services Office [phone] Pager [phone]

## 2020-07-21 NOTE — Op Note (Signed)
This is Dr. Tonita Cong dictating operative summary on Laurie Green. Das date is 07/21/2020.  Medical record #[id number].  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  Postoperative diagnosis: Same  Procedure performed: Right total knee arthroplasty utilizing attune rotating platform.  5 femur 4 tibia 35 patella and a 5 mm insert.  History and indications:  75 year old with end-stage osteoarthritis medial compartment and a subchondral insufficiency fracture.  Nonhealing patient was indicated for replacement of the degenerated joint.  Risk and benefits discussed including bleeding, infection, damage to neurovascular structures, DVT, PE, suboptimal range of motion, arthrofibrosis, loosening anesthetic complications etc.  Technique:  Patient in the supine position after induction of adequate general anesthesia that was proceeded by anesthetic spinal due to the patient's allergy to lidocaine the right lower extremity was prepped and draped in the usual sterile fashion.  This was then exsanguinated and a thigh tourniquet inflated 200 mmHg.  Midline incision was then made over the knee subcutaneous tissue was dissected electrocardiac which July achieve hemostasis.  A full  thickness median parapatellar arthrotomy was performed.  Elevated soft tissues medially preserving the MCL. Patella was everted.  Knee was flexed.  Bone-on-bone arthrosis medial compartment was noted.  Medial and lateral menisci were removed.  A step drill was utilized to enter the femoral canal.  It was irrigated.  An intramedullary guide was placed 5 degree right with 9 off the distal femur.  This was then pinned and we performed a distal femoral cut.  We then sized off the anterior cortex to a 5.  This was then pinned in 3 degrees of external rotation.  The block was applied.  An anterior and posterior and chamfer cuts were then performed with the soft tissues protected posteriorly at all times.   There was no notching of the cortex.  Attention turned to the tibia was subluxed.  We chose to off the defect which is medial.  An external alignment guide.  Parallel to the shaft bisecting the tibiotalar joint.  3 degrees slope.  This was then pinned our block was placed in our oscillating saw perform discussed with the soft tissues protected posteriorly  at all times.  We then tried her extension block with a 5 insert and we had full extension instability.  The knee was then flexed subluxed once again.  Sized to a 4 tray.  This was then pinned with harvested bone centrally and impacted into the distal femur.  We then drilled centrally.  Or punch guide was applied.  I then turned attention back to the femur or box cut jig was applied over the center of the canal.  Box cut was then performed.  And then placed a trial 5 femur and a 5 mm insert we had full extension.  Full flexion.  Good stability with varus valgus stressing at 0 and 30 degrees.  Negative anterior drawer.  Then turned attention to the patella was everted measured at 22 plane to a 15 utilizing the patellar jig.  We then sized it to a 35 medializing her peg holes the wounds were drilled.  A trial patella was placed.  Reduced and we had excellent patellofemoral tracking.  Next all instrumentation was removed.  We checked posteriorly and remnants of the menisci removed.  Popliteus was intact.  The aqua Hildred Priest was used for electrocautery.  Then the joint was irrigated with pulsatile lavage.  The knee was then flexed all surfaces thoroughly dried.  Cement was mixed on the  back table in the appropriate fashion.  This is been placed on the components.  We injected cement into the tibial canal digitally pressurizing it.  There is cemented and impacted the tibia with redundant cement removed.  We cemented and impacted the femur with redundant cement removed.  A 5 trial insert was placed and reduced.  The knee was held in full extension during the curing of the  cement with redundant cement removed.  Saline with epinephrine was placed in the wound.  After appropriate curing the cement the tourniquet was deflated and hemostasis was achieved with the aqua Manis and electrocautery.  The knee was flexed the inserter was removed all redundant cement was then meticulously removed.  The knee was copiously irrigated with pulsatile lavage.  I placed a 5 permanent insert and reduced it.  I had full flexion.  Full extension and good stability with varus valgus stressing at 0 30 degrees.  Negative anterior drawer.  After copious irrigation with pulsatile lavage we reapproximated the patellar arthrotomy with 1 Vicryl interrupted figure-of-eight sutures.  Then oversewn with a running strata fix suture.  Following this had excellent patellofemoral tracking and full flexion and extension and good stability.  Next the wound copes irrigated again subcu was closed with 2 oh skin with Monocryl.  Sterile dressing was applied.  She was awoken without difficulty and transported to the recovery room in satisfactory condition.  Patient tolerated procedure well.  There were no complication.  Assistant Sherlynn Carbon, [place] was used throughout the case.  Blood loss was 50 cc.  Tourniquet time was 59 minutes.  This is Dr. Tonita Cong ending dictation on Laurie Green

## 2020-07-21 NOTE — Discharge Instructions (Signed)
Elevate leg above heart 6x a day for 69mnutes each Use knee immobilizer while walking until can SLR x 10 Use knee immobilizer in bed to keep knee in extension Aquacel dressing may remain in place until follow up. May shower with aquacel dressing in place. If the dressing becomes saturated or peels off, you may remove aquacel dressing. Do not remove steri-strips if they are present. Place new dressing with gauze and tape or ACE bandage which should be kept clean and dry and changed daily.  INSTRUCTIONS AFTER JOINT REPLACEMENT   o Remove items at home which could result in a fall. This includes throw rugs or furniture in walking pathways o ICE to the affected joint every three hours while awake for 30 minutes at a time, for at least the first 3-5 days, and then as needed for pain and swelling.  Continue to use ice for pain and swelling. You may notice swelling that will progress down to the foot and ankle.  This is normal after surgery.  Elevate your leg when you are not up walking on it.   o Continue to use the breathing machine you got in the hospital (incentive spirometer) which will help keep your temperature down.  It is common for your temperature to cycle up and down following surgery, especially at night when you are not up moving around and exerting yourself.  The breathing machine keeps your lungs expanded and your temperature down.   DIET:  As you were doing prior to hospitalization, we recommend a well-balanced diet.  DRESSING / WOUND CARE / SHOWERING  Keep the surgical dressing until follow up.  The dressing is water proof, so you can shower without any extra covering.  IF THE DRESSING FALLS OFF or the wound gets wet inside, change the dressing with sterile gauze.  Please use good hand washing techniques before changing the dressing.  Do not use any lotions or creams on the incision until instructed by your surgeon.    ACTIVITY  o Increase activity slowly as tolerated, but follow the  weight bearing instructions below.   o No driving for 6 weeks or until further direction given by your physician.  You cannot drive while taking narcotics.  o No lifting or carrying greater than 10 lbs. until further directed by your surgeon. o Avoid periods of inactivity such as sitting longer than an hour when not asleep. This helps prevent blood clots.  o You may return to work once you are authorized by your doctor.     WEIGHT BEARING   Weight bearing as tolerated with assist device (walker, cane, etc) as directed, use it as long as suggested by your surgeon or therapist, typically at least 4-6 weeks.   EXERCISES  Results after joint replacement surgery are often greatly improved when you follow the exercise, range of motion and muscle strengthening exercises prescribed by your doctor. Safety measures are also important to protect the joint from further injury. Any time any of these exercises cause you to have increased pain or swelling, decrease what you are doing until you are comfortable again and then slowly increase them. If you have problems or questions, call your caregiver or physical therapist for advice.   Rehabilitation is important following a joint replacement. After just a few days of immobilization, the muscles of the leg can become weakened and shrink (atrophy).  These exercises are designed to build up the tone and strength of the thigh and leg muscles and to improve motion. Often  times heat used for twenty to thirty minutes before working out will loosen up your tissues and help with improving the range of motion but do not use heat for the first two weeks following surgery (sometimes heat can increase post-operative swelling).   These exercises can be done on a training (exercise) mat, on the floor, on a table or on a bed. Use whatever works the best and is most comfortable for you.    Use music or television while you are exercising so that the exercises are a pleasant  break in your day. This will make your life better with the exercises acting as a break in your routine that you can look forward to.   Perform all exercises about fifteen times, three times per day or as directed.  You should exercise both the operative leg and the other leg as well.  Exercises include:   . Quad Sets - Tighten up the muscle on the front of the thigh (Quad) and hold for 5-10 seconds.   . Straight Leg Raises - With your knee straight (if you were given a brace, keep it on), lift the leg to 60 degrees, hold for 3 seconds, and slowly lower the leg.  Perform this exercise against resistance later as your leg gets stronger.  . Leg Slides: Lying on your back, slowly slide your foot toward your buttocks, bending your knee up off the floor (only go as far as is comfortable). Then slowly slide your foot back down until your leg is flat on the floor again.  Glenard Haring Wings: Lying on your back spread your legs to the side as far apart as you can without causing discomfort.  . Hamstring Strength:  Lying on your back, push your heel against the floor with your leg straight by tightening up the muscles of your buttocks.  Repeat, but this time bend your knee to a comfortable angle, and push your heel against the floor.  You may put a pillow under the heel to make it more comfortable if necessary.   A rehabilitation program following joint replacement surgery can speed recovery and prevent re-injury in the future due to weakened muscles. Contact your doctor or a physical therapist for more information on knee rehabilitation.    CONSTIPATION  Constipation is defined medically as fewer than three stools per week and severe constipation as less than one stool per week.  Even if you have a regular bowel pattern at home, your normal regimen is likely to be disrupted due to multiple reasons following surgery.  Combination of anesthesia, postoperative narcotics, change in appetite and fluid intake all can  affect your bowels.   YOU MUST use at least one of the following options; they are listed in order of increasing strength to get the job done.  They are all available over the counter, and you may need to use some, POSSIBLY even all of these options:    Drink plenty of fluids (prune juice may be helpful) and high fiber foods Colace 100 mg by mouth twice a day  Senokot for constipation as directed and as needed Dulcolax (bisacodyl), take with full glass of water  Miralax (polyethylene glycol) once or twice a day as needed.  If you have tried all these things and are unable to have a bowel movement in the first 3-4 days after surgery call either your surgeon or your primary doctor.    If you experience loose stools or diarrhea, hold the medications until you stool  forms back up.  If your symptoms do not get better within 1 week or if they get worse, check with your doctor.  If you experience "the worst abdominal pain ever" or develop nausea or vomiting, please contact the office immediately for further recommendations for treatment.   ITCHING:  If you experience itching with your medications, try taking only a single pain pill, or even half a pain pill at a time.  You can also use Benadryl over the counter for itching or also to help with sleep.   TED HOSE STOCKINGS:  Use stockings on both legs until for at least 2 weeks or as directed by physician office. They may be removed at night for sleeping.  MEDICATIONS:  See your medication summary on the "After Visit Summary" that nursing will review with you.  You may have some home medications which will be placed on hold until you complete the course of blood thinner medication.  It is important for you to complete the blood thinner medication as prescribed.  PRECAUTIONS:  If you experience chest pain or shortness of breath - call 911 immediately for transfer to the hospital emergency department.   If you develop a fever greater that 101 F, purulent  drainage from wound, increased redness or drainage from wound, foul odor from the wound/dressing, or calf pain - CONTACT YOUR SURGEON.                                                   FOLLOW-UP APPOINTMENTS:  If you do not already have a post-op appointment, please call the office for an appointment to be seen by your surgeon.  Guidelines for how soon to be seen are listed in your "After Visit Summary", but are typically between 1-4 weeks after surgery.  OTHER INSTRUCTIONS:   Knee Replacement:  Do not place pillow under knee, focus on keeping the knee straight while resting. CPM instructions: 0-90 degrees, 2 hours in the morning, 2 hours in the afternoon, and 2 hours in the evening. Place foam block, curve side up under heel at all times except when in CPM or when walking.  DO NOT modify, tear, cut, or change the foam block in any way.  POST-OPERATIVE OPIOID TAPER INSTRUCTIONS: . It is important to wean off of your opioid medication as soon as possible. If you do not need pain medication after your surgery it is ok to stop day one. Marland Kitchen Opioids include: o Codeine, Hydrocodone(Norco, Vicodin), Oxycodone(Percocet, oxycontin) and hydromorphone amongst others.  . Long term and even short term use of opiods can cause: o Increased pain response o Dependence o Constipation o Depression o Respiratory depression o And more.  . Withdrawal symptoms can include o Flu like symptoms o Nausea, vomiting o And more . Techniques to manage these symptoms o Hydrate well o Eat regular healthy meals o Stay active o Use relaxation techniques(deep breathing, meditating, yoga) . Do Not substitute Alcohol to help with tapering . If you have been on opioids for less than two weeks and do not have pain than it is ok to stop all together.  . Plan to wean off of opioids o This plan should start within one week post op of your joint replacement. o Maintain the same interval or time between taking each dose and first  decrease the dose.  o Cut  the total daily intake of opioids by one tablet each day o Next start to increase the time between doses. o The last dose that should be eliminated is the evening dose.     MAKE SURE YOU:  . Understand these instructions.  . Get help right away if you are not doing well or get worse.    Thank you for letting us be a part of your medical care team.  It is a privilege we respect greatly.  We hope these instructions will help you stay on track for a fast and full recovery!

## 2020-07-21 NOTE — Interval H&P Note (Signed)
History and Physical Interval Note:  07/21/2020 8:17 AM  Laurie Green  has presented today for surgery, with the diagnosis of Degenerative joint disease right knee.  The various methods of treatment have been discussed with the patient and family. After consideration of risks, benefits and other options for treatment, the patient has consented to  Procedure(s) with comments: TOTAL KNEE ARTHROPLASTY (Right) - 2.5 hrs as a surgical intervention.  The patient's history has been reviewed, patient examined, no change in status, stable for surgery.  I have reviewed the patient's chart and labs.  Questions were answered to the patient's satisfaction.     Johnn Hai

## 2020-07-21 NOTE — Brief Op Note (Signed)
07/21/2020  11:23 AM  PATIENT:  Laurie Green  75 y.o. female  PRE-OPERATIVE DIAGNOSIS:  Degenerative joint disease right knee  POST-OPERATIVE DIAGNOSIS:  Degenerative joint disease right knee  PROCEDURE:  Procedure(s) with comments: TOTAL KNEE ARTHROPLASTY (Right) - 2.5 hrs  SURGEON:  Surgeon(s) and Role:    Susa Day, MD - Primary  PHYSICIAN ASSISTANT:   ASSISTANTS: Bissell   ANESTHESIA:   general  EBL:  50 mL   BLOOD ADMINISTERED:none  DRAINS: none   LOCAL MEDICATIONS USED:  NONE  SPECIMEN:  No Specimen  DISPOSITION OF SPECIMEN:  N/A  COUNTS:  YES  TOURNIQUET:   Total Tourniquet Time Documented: Thigh (Right) - 61 minutes Total: Thigh (Right) - 61 minutes   DICTATION: .Other Dictation: Dictation Number On dragon  PLAN OF CARE: Admit to inpatient   PATIENT DISPOSITION:  PACU - hemodynamically stable.   Delay start of Pharmacological VTE agent (>24hrs) due to surgical blood loss or risk of bleeding: no

## 2020-07-22 ENCOUNTER — Encounter (HOSPITAL_COMMUNITY): Payer: Self-pay | Admitting: Specialist

## 2020-07-22 LAB — BASIC METABOLIC PANEL
Anion gap: 9 (ref 5–15)
BUN: 21 mg/dL (ref 8–23)
CO2: 27 mmol/L (ref 22–32)
Calcium: 8.5 mg/dL — ABNORMAL LOW (ref 8.9–10.3)
Chloride: 99 mmol/L (ref 98–111)
Creatinine, Ser: 0.63 mg/dL (ref 0.44–1.00)
GFR, Estimated: >60 mL/min (ref >60)
Glucose, Bld: 241 mg/dL — ABNORMAL HIGH (ref 70–99)
Potassium: 4.2 mmol/L (ref 3.5–5.1)
Sodium: 135 mmol/L (ref 135–145)

## 2020-07-22 LAB — CBC
HCT: 35.5 % — ABNORMAL LOW (ref 36.0–46.0)
Hemoglobin: 11.9 g/dL — ABNORMAL LOW (ref 12.0–15.0)
MCH: 34 pg (ref 26.0–34.0)
MCHC: 33.5 g/dL (ref 30.0–36.0)
MCV: 101.4 fL — ABNORMAL HIGH (ref 80.0–100.0)
Platelets: 212 10*3/uL (ref 150–400)
RBC: 3.5 MIL/uL — ABNORMAL LOW (ref 3.87–5.11)
RDW: 13.1 % (ref 11.5–15.5)
WBC: 14.4 10*3/uL — ABNORMAL HIGH (ref 4.0–10.5)
nRBC: 0 % (ref 0.0–0.2)

## 2020-07-22 LAB — GLUCOSE, CAPILLARY
Glucose-Capillary: 190 mg/dL — ABNORMAL HIGH (ref 70–99)
Glucose-Capillary: 229 mg/dL — ABNORMAL HIGH (ref 70–99)
Glucose-Capillary: 195 mg/dL — ABNORMAL HIGH (ref 70–99)
Glucose-Capillary: 180 mg/dL — ABNORMAL HIGH (ref 70–99)
Glucose-Capillary: 182 mg/dL — ABNORMAL HIGH (ref 70–99)

## 2020-07-22 MED ORDER — OXYCODONE HCL 5 MG PO TABS: 5.0000 mg | ORAL_TABLET | ORAL | Status: DC | PRN

## 2020-07-22 MED ORDER — PROMETHAZINE HCL 12.5 MG RE SUPP
12.5000 mg | Freq: Four times a day (QID) | RECTAL | Status: DC | PRN
  Filled 2020-07-22: qty 1

## 2020-07-22 MED ORDER — SODIUM CHLORIDE 0.9 % IV SOLN
12.5000 mg | Freq: Four times a day (QID) | INTRAVENOUS | Status: DC | PRN
  Filled 2020-07-22: qty 0.5

## 2020-07-22 MED ORDER — TRAMADOL HCL 50 MG PO TABS
50.0000 mg | ORAL_TABLET | Freq: Four times a day (QID) | ORAL | Status: DC | PRN
  Administered 2020-07-22 (×2): 100 mg via ORAL
  Administered 2020-07-22: 50 mg via ORAL
  Administered 2020-07-23 – 2020-07-25 (×6): 100 mg via ORAL
  Filled 2020-07-22 (×9): qty 2

## 2020-07-22 MED ORDER — PROMETHAZINE HCL 25 MG PO TABS: 12.5000 mg | ORAL_TABLET | Freq: Four times a day (QID) | ORAL | Status: DC | PRN

## 2020-07-22 MED ORDER — HYDROMORPHONE HCL 1 MG/ML IJ SOLN
0.5000 mg | INTRAMUSCULAR | Status: DC | PRN
  Administered 2020-07-22 – 2020-07-24 (×8): 1 mg via INTRAVENOUS
  Filled 2020-07-22 (×9): qty 1

## 2020-07-22 NOTE — Progress Notes (Signed)
Pt states pain is a 10/10 now, anesthesia paged but no call back, on-call provider for [facility] paged and return call received from [facility], [place]. Due to patients many allergies to narcotics and instructions given by MD, no further orders were received at this time but was told rounding team will be seeing patient soon and will address pain issues. Patient also complaining of some itchiness to her arms and chest, no hives or rash noted, only some scratch marks from patient scratching. Po benadryl and iv zofran given at this time, will monitor.

## 2020-07-22 NOTE — Evaluation (Addendum)
Physical Therapy Evaluation Patient Details Name: Laurie Green MRN: [id number] DOB: 1945/07/23 Today's Date: 07/22/2020   History of Present Illness  Laurie Green is a 75 yo female s/p R TKA 07/21/20. PMH: diabetes, DVT, fibromyalgia, HTN, TIA  Clinical Impression  Pt is s/p R TKA 07/21/20 resulting in the deficits listed below (see PT Problem List). At baseline, pt independent, using R knee brace, drives, reports 1 fall. Pt currently significantly limited due to excruciating pain complaints. PT attempted to don knee brace, but pt crying in pain with therapist attempting to don. Cued pt through ankle pumps and isometric glute sets with pt reporting pain in RLE. Pt unable to perform R SLR, quad set and heel slide. Pt unable to tolerate supine to sit transfer, stand or take steps due to writhing in pain while supine in bed. Pt slid down in bed, requiring 2 person max A to scoot up in bed with use of bed pad. RN notified of pt's significant pain and limited eval; per RN and pt, pt with multiple pain medication allergies limiting treatment options. Will continue to progress acute PT as able pending pt's pain tolerance. Pt's family in room offering support and assist as needed. Pt would benefit from skilled PT to increase their independence and safety with mobility to allow discharge to the venue listed below.      Follow Up Recommendations Follow surgeon's recommendation for DC plan and follow-up therapies    Equipment Recommendations  3in1 (PT)    Recommendations for Other Services       Precautions / Restrictions Precautions Precautions: Fall Precaution Comments: high pain Restrictions Weight Bearing Restrictions: No RLE Weight Bearing: Weight bearing as tolerated      Mobility  Bed Mobility Overal bed mobility: Needs Assistance  General bed mobility comments: Pt requires max A+2 with scooting up in bed, tearful and writhing in pain; unable to perform supine<>sit due to high pain in  supine with attempted RLE movement    Transfers  General transfer comment: unable due to high pain  Ambulation/Gait  General Gait Details: unable due to high pain  Stairs            Wheelchair Mobility    Modified Rankin (Stroke Patients Only)       Balance          Pertinent Vitals/Pain Pain Assessment: 0-10 Pain Score: 10-Worst pain ever Pain Location: R LE Pain Descriptors / Indicators: Aching;Throbbing;Stabbing;Crying;Grimacing;Guarding;Moaning;Pressure Pain Intervention(s): Limited activity within patient's tolerance;Monitored during session;Repositioned;Ice applied    Home Living Family/patient expects to be discharged to:: Private residence Living Arrangements: Spouse/significant other Available Help at Discharge: Family;Available 24 hours/day Type of Home: House Home Access: Stairs to enter Entrance Stairs-Rails: None Entrance Stairs-Number of Steps: 3 Home Layout: Two level;Able to live on main level with bedroom/bathroom Home Equipment: Crutches;Walker - 2 wheels      Prior Function Level of Independence: Independent         Comments: Pt reports independent with ADLs, community ambulation, drives, reports 1 fall, wearing R knee brace with metal struts to support.     Hand Dominance        Extremity/Trunk Assessment        Lower Extremity Assessment Lower Extremity Assessment: RLE deficits/detail;LLE deficits/detail RLE Deficits / Details: unable to perform R quad set or SLR, ankle pump to neutral with increased pain, isometric glute contraction with increased pain RLE: Unable to fully assess due to pain LLE Deficits / Details: AROM WNL, able  to perform SLR, quad set, ankle pumps LLE Sensation: WNL LLE Coordination: WNL       Communication   Communication: No difficulties  Cognition Arousal/Alertness: Awake/alert Behavior During Therapy: WFL for tasks assessed/performed Overall Cognitive Status: Within Functional Limits for tasks  assessed  General Comments: Pt alert and oriented, family in room offering support/encouragement- pt crying due to high pain      General Comments General comments (skin integrity, edema, etc.): Pt on RA with SpO2 89-93% during eval    Exercises Total Joint Exercises Ankle Circles/Pumps: Supine;AROM;Strengthening;Both;10 reps Quad Sets: Supine;AROM;Strengthening;Left;5 reps (unable to perform R quad set) Gluteal Sets: Supine;AROM;Strengthening;Both;10 reps Heel Slides: Supine;AROM;Strengthening;Left;5 reps (unable to perform R heel slide) Straight Leg Raises: Supine;AROM;Strengthening;Left;5 reps (unable to perform R SLR)   Assessment/Plan    PT Assessment Patient needs continued PT services  PT Problem List Decreased strength;Decreased range of motion;Decreased activity tolerance;Decreased balance;Decreased mobility;Pain       PT Treatment Interventions DME instruction;Gait training;Stair training;Functional mobility training;Therapeutic activities;Therapeutic exercise;Balance training;Neuromuscular re-education;Patient/family education    PT Goals (Current goals can be found in the Care Plan section)  Acute Rehab PT Goals Patient Stated Goal: "less pain" PT Goal Formulation: With patient/family Time For Goal Achievement: 08/05/20 Potential to Achieve Goals: Good    Frequency 7X/week   Barriers to discharge        Co-evaluation               AM-PAC PT "6 Clicks" Mobility  Outcome Measure Help needed turning from your back to your side while in a flat bed without using bedrails?: Total Help needed moving from lying on your back to sitting on the side of a flat bed without using bedrails?: Total Help needed moving to and from a bed to a chair (including a wheelchair)?: Total Help needed standing up from a chair using your arms (e.g., wheelchair or bedside chair)?: Total Help needed to walk in hospital room?: Total Help needed climbing 3-5 steps with a railing? :  Total 6 Click Score: 6    End of Session   Activity Tolerance: Patient limited by pain Patient left: in bed;with call bell/phone within reach;with family/visitor present Nurse Communication: Mobility status;Other (comment) (high pain/writhing in pain) PT Visit Diagnosis: Other abnormalities of gait and mobility (R26.89);Muscle weakness (generalized) (M62.81);Pain Pain - Right/Left: Right Pain - part of body: Leg;Knee    Time: 5170-0174 PT Time Calculation (min) (ACUTE ONLY): 29 min   Charges:   PT Evaluation $PT Eval Low Complexity: 1 Low PT Treatments $Therapeutic Exercise: 8-22 mins         Tori Mckenleigh Tarlton PT, DPT 07/22/20, 10:55 AM

## 2020-07-22 NOTE — Progress Notes (Signed)
Patient ID: Laurie Green, female   DOB: April 14, 1946, 75 y.o.   MRN: [id number] Subjective: 1 Day Post-Op Procedure(s) (LRB): TOTAL KNEE ARTHROPLASTY (Right) Patient reports pain as severe.    Patient has complaints of R knee pain, worse in the last few hours  We will start therapy today. Plan is to go home after hospital stay.  Objective: Vital signs in last 24 hours: Temp:  [97 F (36.1 C)-98.7 F (37.1 C)] 98.4 F (36.9 C) (04/07 0550) Pulse Rate:  [63-77] 77 (04/07 0550) Resp:  [9-17] 16 (04/07 0550) BP: (114-160)/(57-80) 118/69 (04/07 0550) SpO2:  [95 %-100 %] 98 % (04/07 0550)  Intake/Output from previous day:  Intake/Output Summary (Last 24 hours) at 07/22/2020 0825 Last data filed at 07/22/2020 0200 Gross per 24 hour  Intake 2801.91 ml  Output 850 ml  Net 1951.91 ml    Intake/Output this shift: No intake/output data recorded.  Labs: Results for orders placed or performed during the hospital encounter of 07/21/20  Glucose, capillary  Result Value Ref Range   Glucose-Capillary 104 (H) 70 - 99 mg/dL  Glucose, capillary  Result Value Ref Range   Glucose-Capillary 115 (H) 70 - 99 mg/dL  Glucose, capillary  Result Value Ref Range   Glucose-Capillary 125 (H) 70 - 99 mg/dL  Hemoglobin A1c  Result Value Ref Range   Hgb A1c MFr Bld 7.3 (H) 4.8 - 5.6 %   Mean Plasma Glucose 162.81 mg/dL  Glucose, capillary  Result Value Ref Range   Glucose-Capillary 171 (H) 70 - 99 mg/dL  CBC  Result Value Ref Range   WBC 14.4 (H) 4.0 - 10.5 K/uL   RBC 3.50 (L) 3.87 - 5.11 MIL/uL   Hemoglobin 11.9 (L) 12.0 - 15.0 g/dL   HCT 35.5 (L) 36.0 - 46.0 %   MCV 101.4 (H) 80.0 - 100.0 fL   MCH 34.0 26.0 - 34.0 pg   MCHC 33.5 30.0 - 36.0 g/dL   RDW 13.1 11.5 - 15.5 %   Platelets 212 150 - 400 K/uL   nRBC 0.0 0.0 - 0.2 %  Basic metabolic panel  Result Value Ref Range   Sodium 135 135 - 145 mmol/L   Potassium 4.2 3.5 - 5.1 mmol/L   Chloride 99 98 - 111 mmol/L   CO2 27 22 - 32 mmol/L    Glucose, Bld 241 (H) 70 - 99 mg/dL   BUN 21 8 - 23 mg/dL   Creatinine, Ser 0.63 0.44 - 1.00 mg/dL   Calcium 8.5 (L) 8.9 - 10.3 mg/dL   GFR, Estimated >60 >60 mL/min   Anion gap 9 5 - 15  Glucose, capillary  Result Value Ref Range   Glucose-Capillary 213 (H) 70 - 99 mg/dL  Glucose, capillary  Result Value Ref Range   Glucose-Capillary 190 (H) 70 - 99 mg/dL  Glucose, capillary  Result Value Ref Range   Glucose-Capillary 229 (H) 70 - 99 mg/dL    Exam - Neurologically intact ABD soft Neurovascular intact Sensation intact distally Intact pulses distally Dorsiflexion/Plantar flexion intact Incision: dressing C/D/I and no drainage No cellulitis present Compartment soft no calf pain or sign of DVT Dressing - clean, dry, no drainage Motor function intact - moving foot and toes well on exam.  Assessment/Plan: 1 Day Post-Op Procedure(s) (LRB): TOTAL KNEE ARTHROPLASTY (Right)  Advance diet Up with therapy D/C IV fluids Past Medical History:  Diagnosis Date  . Anginal pain ([place])   . 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 Antiphospholipid antibody with hypercoagulable state ([place]) 11/07/2012  . Arthritis   . Asthma    well controlled, no rescue inhaler used  . Blood dyscrasia    anticardiolipid syndrome  . Blood transfusion    age 75 yr old-none since  . Celiac disease   . Diabetes mellitus    Type II  . Diverticulitis   . DVT (deep venous thrombosis) ([place]) 05/01/2011   "to liver"  . Family history of adverse reaction to anesthesia    N&V  . Fibromyalgia   . GERD (gastroesophageal reflux disease)    11/01/16- not problem  . H/O seasonal allergies   . Heart murmur    "not a problem"  . Hypertension   . NASH (nonalcoholic steatohepatitis) 05/01/2011  . Nausea & vomiting    11-17-14 "nausea and vomiting after meals" at present discussed with Dr. Oletta Lamas office per pt.  . Pneumonia 2021  . PONV (postoperative nausea and vomiting)     'I dont have it if they give me something in the IV."  . Portal hypertension ([place])   . Stroke ([place])    tia- eye,  . TIA (transient ischemic attack)   . Varices 05/01/2011  . Varices, esophageal ([place])   . Varices, gastric     DVT Prophylaxis - Eliquis Weight-Bearing as tolerated to right leg No vaccine Will adjust pain meds to improve pain control  Anticipated LOS equal to or greater than 2 midnights due to - Age 11 and older with one or more of the following:  - Obesity  - Expected need for hospital services (PT, OT, Nursing) required for safe  discharge  - Anticipated need for postoperative skilled nursing care or inpatient rehab  - Active co-morbidities: DVT/VTE OR   - Unanticipated findings during/Post Surgery: Slow post-op progression: GI, pain control, mobility  - Patient is a high risk of re-admission due to: None   Cecilie Kicks 07/22/2020, 8:25 AM

## 2020-07-22 NOTE — TOC Initial Note (Signed)
Transition of Care [facility]) - Initial/Assessment Note   Patient Details  Name: Laurie Green MRN: [id number] Date of Birth: [birth year]  Transition of Care [facility]) CM/SW Contact:    Sherie Don, LCSW Phone Number: 07/22/2020, 2:07 PM  Clinical Narrative: Patient is a 75 year old female who was admitted for right knee DJD. Initial plan was for patient to discharge home with HHPT.  CSW met with patient to discuss discharge needs. Patient confirmed she would prefer to discharge home with HHPT rather than go to a SNF. Patient will be staying with her daughter during the day ([place], [place],  [id number]) and will need HHPT at that location. Patient already has a walker at home, but husband thinks she will need a 3N1 as recommended by PT.  CSW made referral to Georgina Snell with [facility] for [place]. Referral accepted; orders needed at discharge. TOC to follow.  Expected Discharge Plan: [place] Barriers to Discharge: Continued Medical Work up  Patient Goals and CMS Choice Patient states their goals for this hospitalization and ongoing recovery are:: Discharge home with [place] CMS Medicare.gov Compare Post Acute Care list provided to:: Patient  Expected Discharge Plan and Services Expected Discharge Plan: [place] In-house Referral: Clinical Social Work Post Acute Care Choice: [place] Living arrangements for the past 2 months: [place]             DME Arranged: 3-N-1 DME Agency: [place] Arranged: PT  Prior Living Arrangements/Services Living arrangements for the past 2 months: Single Family Home Lives with:: Spouse Patient language and need for interpreter reviewed:: Yes Do you feel safe going back to the place where you live?: Yes      Need for Family Participation in Patient Care: No (Comment) Care giver support system in place?: Yes (comment) Current home services: DME [facility]) Criminal Activity/Legal  Involvement Pertinent to Current Situation/Hospitalization: No - Comment as needed  Activities of Daily Living Home Assistive Devices/Equipment: None ADL Screening (condition at time of admission) Patient's cognitive ability adequate to safely complete daily activities?: Yes Is the patient deaf or have difficulty hearing?: No Does the patient have difficulty seeing, even when wearing glasses/contacts?: No Does the patient have difficulty concentrating, remembering, or making decisions?: No Patient able to express need for assistance with ADLs?: Yes Does the patient have difficulty dressing or bathing?: No Independently performs ADLs?: Yes (appropriate for developmental age) Does the patient have difficulty walking or climbing stairs?: No Weakness of Legs: Right Weakness of Arms/Hands: None  Permission Sought/Granted Permission sought to share information with : Other (comment) Permission granted to share information with : Yes, Verbal Permission Granted Permission granted to share info w AGENCY: [place] agencies  Emotional Assessment Appearance:: Appears stated age Attitude/Demeanor/Rapport: Engaged Affect (typically observed): Accepting Orientation: : Oriented to Self,Oriented to Place,Oriented to  Time,Oriented to Situation Alcohol / Substance Use: Not Applicable Psych Involvement: No (comment)  Admission diagnosis:  Right knee DJD [M17.11] Patient Active Problem List   Diagnosis Date Noted  . 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 Antiphospholipid antibody with hypercoagulable state ([place]) 11/07/2012  . Transaminitis 09/07/2012  . Fever, unspecified 09/07/2012  . Abdominal pain 09/04/2012  . Subtherapeutic international normalized ratio (INR) 09/04/2012  . Diabetes mellitus ([place]) 09/04/2012  . Leukocytosis, unspecified 09/04/2012  . Varices, esophageal ([place]) 09/04/2012  . Transaminasemia 09/04/2012  . DVT (deep venous thrombosis) ([place]) 05/01/2011  . Varices 05/01/2011  . NASH  (nonalcoholic steatohepatitis) 05/01/2011  .  Incisional hernia without mention of obstruction or gangrene 02/14/2011   PCP:  Maurice Small, MD Pharmacy:   [id number] [place] [place] - [address] [id number]: [phone]: [phone] [place] [place]Phone: [phone]: [id number] [place]Phone: [phone]: [phone] [id number] [place], [place]hone: [phone]: [phone] Risk Interventions No flowsheet data found.

## 2020-07-22 NOTE — Progress Notes (Signed)
Physical Therapy Treatment Patient Details Name: Laurie Green MRN: [id number] DOB: 20-Jun-1945 Today's Date: 07/22/2020    History of Present Illness Laurie Green is a 75 yo female s/p R TKA 07/21/20. PMH: diabetes, DVT, fibromyalgia, HTN, TIA    PT Comments    Pt continues to be limited by pain, this session also limited by dizziness. Pt crying in bed with daughter at bedside upon arrival, pt states "I can't bend my knee". Pt requesting to get to [facility] to void bladder, requiring max A +2 to sit up at bedside, pt's daughter in room assisting pt at trunk while therapist stabilizes RLE to avoid bending and increased pain. Pt requiring max A to rise to standing and to pivot to [facility], heavy cues for hand placement and sequencing for safety. Pt becomes dizzy after seated on BSC for ~5 minutes, therapist assists pt back to EOB then into supine requiring significant verbal cues for hand placement and sequencing, pt's BP 148/5mHg and RN in room. Pt on RA with SpO2 87%, returned 2L O2 and pt up to 90-93% within 1 minute of pursed lip breathing. Pt's daughter in room throughout session providing encouragement. Pt continues to be significantly limited due to high pain, unable to take steps this session or flex knee throughout all mobility. Will continue to progress acute PT as able.   Follow Up Recommendations  Follow surgeon's recommendation for DC plan and follow-up therapies     Equipment Recommendations  3in1 (PT)    Recommendations for Other Services       Precautions / Restrictions Precautions Precautions: Fall Precaution Comments: high pain, monitor O2 Restrictions Weight Bearing Restrictions: No    Mobility  Bed Mobility Overal bed mobility: Needs Assistance Bed Mobility: Supine to Sit;Sit to Supine  Supine to sit: Max assist;+2 for physical assistance;+2 for safety/equipment Sit to supine: Max assist   General bed mobility comments: max A +2 for supine to sit for RLE and trunk  management due to high pain    Transfers Overall transfer level: Needs assistance Equipment used: 1 person hand held assist Transfers: Sit to/from SW. R. BerkleySit to Stand: Max assist  Squat pivot transfers: Max assist    General transfer comment: max A for STS and squat pivot over to BSC, max verbal cues for hand placement and sequencing  Ambulation/Gait  General Gait Details: not attempted, high pain and dizziness while seated BSC   Stairs             Wheelchair Mobility    Modified Rankin (Stroke Patients Only)       Balance Overall balance assessment: Needs assistance Sitting-balance support: Feet supported Sitting balance-Leahy Scale: Fair Sitting balance - Comments: seated EOB   Standing balance support: During functional activity Standing balance-Leahy Scale: Zero Standing balance comment: max A in standing, UE support       Cognition Arousal/Alertness: Awake/alert Behavior During Therapy: Anxious Overall Cognitive Status: Within Functional Limits for tasks assessed  General Comments: Pt appears anxious during session, fearful due to pain at rest, needs restroom but afraid to move, encouragement and soothing techniques from therapist and daughter improve pt's mood      Exercises Total Joint Exercises Ankle Circles/Pumps: Supine;AROM;Strengthening;Both;10 reps Quad Sets: Supine;AROM;Strengthening;Left;5 reps (unable to perform R quad set) Gluteal Sets: Supine;AROM;Strengthening;Both;10 reps Heel Slides: Supine;AROM;Strengthening;Left;5 reps (unable to perform R heel slide) Straight Leg Raises: Supine;AROM;Strengthening;Left;5 reps (unable to perform R SLR)    General Comments General comments (skin integrity, edema, etc.): Pt on  RA, desats to 87% with mobility, cued for pursed lip breathing, returned 2L O2 and SpO2 90-93% within 1 minute of pursed lip breathing.      Pertinent Vitals/Pain Pain Assessment: 0-10 Pain Score: 10-Worst  pain ever Pain Location: R LE Pain Descriptors / Indicators: Aching;Throbbing;Stabbing;Crying;Grimacing;Guarding;Moaning;Pressure Pain Intervention(s): Limited activity within patient's tolerance;Monitored during session;Premedicated before session;Repositioned;Ice applied    Home Living                      Prior Function            PT Goals (current goals can now be found in the care plan section) Acute Rehab PT Goals Patient Stated Goal: "less pain" PT Goal Formulation: With patient/family Time For Goal Achievement: 08/05/20 Potential to Achieve Goals: Good Progress towards PT goals: Progressing toward goals    Frequency    7X/week      PT Plan Current plan remains appropriate    Co-evaluation              AM-PAC PT "6 Clicks" Mobility   Outcome Measure  Help needed turning from your back to your side while in a flat bed without using bedrails?: Total Help needed moving from lying on your back to sitting on the side of a flat bed without using bedrails?: Total Help needed moving to and from a bed to a chair (including a wheelchair)?: Total Help needed standing up from a chair using your arms (e.g., wheelchair or bedside chair)?: Total Help needed to walk in hospital room?: Total Help needed climbing 3-5 steps with a railing? : Total 6 Click Score: 6    End of Session Equipment Utilized During Treatment: Gait belt;Oxygen Activity Tolerance: Patient limited by pain Patient left: in bed;with call bell/phone within reach;with family/visitor present Nurse Communication: Mobility status;Other (comment) (high pain, dizziness, O2) PT Visit Diagnosis: Other abnormalities of gait and mobility (R26.89);Muscle weakness (generalized) (M62.81);Pain Pain - Right/Left: Right Pain - part of body: Leg;Knee     Time: 5038-8828 PT Time Calculation (min) (ACUTE ONLY): 29 min  Charges:  $Therapeutic Exercise: 8-22 mins $Therapeutic Activity: 23-37 mins                       Tori Quill Grinder PT, DPT 07/22/20, 2:11 PM

## 2020-07-23 LAB — CBC
HCT: 36.9 % (ref 36.0–46.0)
Hemoglobin: 12.5 g/dL (ref 12.0–15.0)
MCH: 34.1 pg — ABNORMAL HIGH (ref 26.0–34.0)
MCHC: 33.9 g/dL (ref 30.0–36.0)
MCV: 100.5 fL — ABNORMAL HIGH (ref 80.0–100.0)
Platelets: 226 10*3/uL (ref 150–400)
RBC: 3.67 MIL/uL — ABNORMAL LOW (ref 3.87–5.11)
RDW: 12.7 % (ref 11.5–15.5)
WBC: 13.7 10*3/uL — ABNORMAL HIGH (ref 4.0–10.5)
nRBC: 0 % (ref 0.0–0.2)

## 2020-07-23 LAB — GLUCOSE, CAPILLARY
Glucose-Capillary: 175 mg/dL — ABNORMAL HIGH (ref 70–99)
Glucose-Capillary: 157 mg/dL — ABNORMAL HIGH (ref 70–99)
Glucose-Capillary: 155 mg/dL — ABNORMAL HIGH (ref 70–99)
Glucose-Capillary: 217 mg/dL — ABNORMAL HIGH (ref 70–99)

## 2020-07-23 MED ORDER — ONDANSETRON HCL 4 MG PO TABS: 4.0000 mg | ORAL_TABLET | Freq: Four times a day (QID) | ORAL | 0 refills | Status: DC | PRN

## 2020-07-23 MED ORDER — TRAMADOL HCL 50 MG PO TABS: 50.0000 mg | ORAL_TABLET | Freq: Four times a day (QID) | ORAL | 0 refills | Status: DC | PRN

## 2020-07-23 MED ORDER — OXYCODONE HCL 5 MG PO TABS: 5.0000 mg | ORAL_TABLET | ORAL | 0 refills | Status: DC | PRN

## 2020-07-23 NOTE — Progress Notes (Addendum)
Physical Therapy Treatment Patient Details Name: Laurie Green MRN: [id number] DOB: November 12, 1945 Today's Date: 07/23/2020    History of Present Illness Laurie Green is a 75 yo female s/p R TKA 07/21/20. PMH: diabetes, DVT, fibromyalgia, HTN, TIA    PT Comments    Pt continues to be limited by high pain due to pain medication allergies. Pt able to inch RLE over to EOB with L leg in hooklying scooting hips over, but ultimately requiring 2+ mod assist to achieve sitting EOB for trunk and RLE management. Once seated EOB, knee immobilizer donned for pt comfort and due to quad weakness. Pt tolerates ankle pumps, able to activate R quad in quad set and glute sets for strengthening. Pt able to power up to stand with min A +2, pivot over to [facility] to void bladder, then rise and pivot back to EOB. Pt tolerates 4-5 slow, fairly steady steps up to [facility] with min A+2 and verbal cues for sequencing and relaxation to reduce anxiousness. Pt fatigued and in pain at EOS, requests to return to supine instead of transferring to chair. Pt c/o dizziness, with BP 116/51mHg. Pt on 2L O2 and desats with mobility to 88%, cued for pursed lip breathing and improves to 94%. Pt's daughter in room providing support and encouragement throughout. Pt now planning to d/c home with daughter with 1STE- reviewed using a chair, turning to sit up onto step to enter the home to avoid stair climbing, will continue to review and discuss with pt and family as pt is not appropriate to go home at this time. Pt is still a high fall risk and requiring 2+ assist, will coordinate afternoon session with pain medication to improve outcomes.   Follow Up Recommendations  Follow surgeon's recommendation for DC plan and follow-up therapies     Equipment Recommendations  3in1 (PT)    Recommendations for Other Services       Precautions / Restrictions Precautions Precautions: Fall Precaution Comments: high pain, monitor O2 Required Braces or  Orthoses: Knee Immobilizer - Right (donned in sitting to maintain knee extension for pt comfort) Restrictions Weight Bearing Restrictions: No    Mobility  Bed Mobility Overal bed mobility: Needs Assistance Bed Mobility: Supine to Sit;Sit to Supine  Supine to sit: Mod assist;+2 for physical assistance;+2 for safety/equipment Sit to supine: Mod assist   General bed mobility comments: pt able to inch RLE over towards EOB with therapist supporting knee in extended position, using L LE in hooklying position to scoot hips over to EOB, ultimately mod A +2 to bring RLE off of bed and to upright trunk; mod A to lift RLE back into bed    Transfers Overall transfer level: Needs assistance Equipment used: Rolling walker (2 wheeled) Transfers: Sit to/from SOmnicareSit to Stand: Min assist;+2 physical assistance;+2 safety/equipment Stand pivot transfers: Min assist;+2 physical assistance;+2 safety/equipment  General transfer comment: min A +2 for STS and stand pivot with R knee immobilizer donned, mod cues for RLE positioning and hand placement along with relaxation techniques throughout, pt requires ~10 minutes to pivot due to high pain  Ambulation/Gait Ambulation/Gait assistance: Min assist;+2 physical assistance;+2 safety/equipment Gait Distance (Feet): 2 Feet Assistive device: Rolling walker (2 wheeled) Gait Pattern/deviations: Step-to pattern;Decreased stance time - right;Decreased weight shift to right;Ataxic Gait velocity: decreased   General Gait Details: pt tolerates short, fairly steady, slow steps from [facility] LLCto EOB then sidesteps up HOB, requiring cues for sequencing, UE assisting, relaxation techniques and significantly increased time  to complete, knee immobilizer donned throughout to avoid painful knee flexion   Stairs             Wheelchair Mobility    Modified Rankin (Stroke Patients Only)       Balance Overall balance assessment: Needs  assistance Sitting-balance support: Feet supported Sitting balance-Leahy Scale: Fair Sitting balance - Comments: seated EOB   Standing balance support: During functional activity;Bilateral upper extremity supported Standing balance-Leahy Scale: Poor Standing balance comment: min A, reliant on UE support     Cognition Arousal/Alertness: Awake/alert Behavior During Therapy: Anxious Overall Cognitive Status: Within Functional Limits for tasks assessed  General Comments: Pt verbalizes anxiousness with mobility, daughter and therpists providing encouragement and relaxation techniques throughout      Exercises Total Joint Exercises Ankle Circles/Pumps: Supine;AROM;Strengthening;Both;10 reps (additional 10 reps in sitting) Quad Sets: Seated;Strengthening;Right;10 reps (seated EOB with RLE extended and knee immobilizer on) Gluteal Sets: Supine;AROM;Strengthening;Both;10 reps    General Comments General comments (skin integrity, edema, etc.): Pt on 2L O2, desats to 88% with mobility but able to recover to 94% with verbal cues for pursed lip breathing; 93% at EOS      Pertinent Vitals/Pain Pain Assessment: 0-10 Pain Score: 10-Worst pain ever Pain Location: R LE Pain Descriptors / Indicators: Aching;Throbbing;Stabbing;Grimacing;Guarding;Moaning;Pressure Pain Intervention(s): Limited activity within patient's tolerance;Monitored during session;Premedicated before session;Repositioned;Ice applied;Utilized relaxation techniques    Home Living                      Prior Function            PT Goals (current goals can now be found in the care plan section) Acute Rehab PT Goals Patient Stated Goal: "less pain" PT Goal Formulation: With patient/family Time For Goal Achievement: 08/05/20 Potential to Achieve Goals: Good Progress towards PT goals: Progressing toward goals    Frequency    7X/week      PT Plan Current plan remains appropriate    Co-evaluation               AM-PAC PT "6 Clicks" Mobility   Outcome Measure  Help needed turning from your back to your side while in a flat bed without using bedrails?: Total Help needed moving from lying on your back to sitting on the side of a flat bed without using bedrails?: Total Help needed moving to and from a bed to a chair (including a wheelchair)?: Total Help needed standing up from a chair using your arms (e.g., wheelchair or bedside chair)?: Total Help needed to walk in hospital room?: Total Help needed climbing 3-5 steps with a railing? : Total 6 Click Score: 6    End of Session Equipment Utilized During Treatment: Gait belt;Oxygen Activity Tolerance: Patient limited by pain Patient left: in bed;with call bell/phone within reach;with family/visitor present Nurse Communication: Mobility status PT Visit Diagnosis: Other abnormalities of gait and mobility (R26.89);Muscle weakness (generalized) (M62.81);Pain Pain - Right/Left: Right Pain - part of body: Leg;Knee     Time: 3220-2542 PT Time Calculation (min) (ACUTE ONLY): 52 min  Charges:  $Gait Training: 8-22 mins $Therapeutic Exercise: 8-22 mins $Therapeutic Activity: 8-22 mins                      Tori Amish Mintzer PT, DPT 07/23/20, 11:02 AM

## 2020-07-23 NOTE — Progress Notes (Signed)
Patient ID: Laurie Green, female   DOB: 1946/01/07, 75 y.o.   MRN: [id number] Subjective: 2 Days Post-Op Procedure(s) (LRB): TOTAL KNEE ARTHROPLASTY (Right) Patient reports pain as moderate.    Patient has complaints of R knee pain, improved since yesterday. Not much nausea. Denies numbness or tingling.  Objective: Vital signs in last 24 hours: Temp:  [98.8 F (37.1 C)-100.1 F (37.8 C)] 98.8 F (37.1 C) (04/08 0703) Pulse Rate:  [78-84] 84 (04/08 0703) Resp:  [16-17] 16 (04/08 0703) BP: (130-145)/(66-71) 132/66 (04/08 0703) SpO2:  [92 %-98 %] 96 % (04/08 0703)  Intake/Output from previous day:  Intake/Output Summary (Last 24 hours) at 07/23/2020 1155 Last data filed at 07/23/2020 0703 Gross per 24 hour  Intake 660 ml  Output 700 ml  Net -40 ml    Intake/Output this shift: Total I/O In: -  Out: 300 [Urine:300]  Labs: Results for orders placed or performed during the hospital encounter of 07/21/20  Glucose, capillary  Result Value Ref Range   Glucose-Capillary 104 (H) 70 - 99 mg/dL  Glucose, capillary  Result Value Ref Range   Glucose-Capillary 115 (H) 70 - 99 mg/dL  Glucose, capillary  Result Value Ref Range   Glucose-Capillary 125 (H) 70 - 99 mg/dL  Hemoglobin A1c  Result Value Ref Range   Hgb A1c MFr Bld 7.3 (H) 4.8 - 5.6 %   Mean Plasma Glucose 162.81 mg/dL  Glucose, capillary  Result Value Ref Range   Glucose-Capillary 171 (H) 70 - 99 mg/dL  CBC  Result Value Ref Range   WBC 14.4 (H) 4.0 - 10.5 K/uL   RBC 3.50 (L) 3.87 - 5.11 MIL/uL   Hemoglobin 11.9 (L) 12.0 - 15.0 g/dL   HCT 35.5 (L) 36.0 - 46.0 %   MCV 101.4 (H) 80.0 - 100.0 fL   MCH 34.0 26.0 - 34.0 pg   MCHC 33.5 30.0 - 36.0 g/dL   RDW 13.1 11.5 - 15.5 %   Platelets 212 150 - 400 K/uL   nRBC 0.0 0.0 - 0.2 %  Basic metabolic panel  Result Value Ref Range   Sodium 135 135 - 145 mmol/L   Potassium 4.2 3.5 - 5.1 mmol/L   Chloride 99 98 - 111 mmol/L   CO2 27 22 - 32 mmol/L   Glucose, Bld 241 (H)  70 - 99 mg/dL   BUN 21 8 - 23 mg/dL   Creatinine, Ser 0.63 0.44 - 1.00 mg/dL   Calcium 8.5 (L) 8.9 - 10.3 mg/dL   GFR, Estimated >60 >60 mL/min   Anion gap 9 5 - 15  Glucose, capillary  Result Value Ref Range   Glucose-Capillary 213 (H) 70 - 99 mg/dL  Glucose, capillary  Result Value Ref Range   Glucose-Capillary 190 (H) 70 - 99 mg/dL  Glucose, capillary  Result Value Ref Range   Glucose-Capillary 229 (H) 70 - 99 mg/dL  Glucose, capillary  Result Value Ref Range   Glucose-Capillary 195 (H) 70 - 99 mg/dL  CBC  Result Value Ref Range   WBC 13.7 (H) 4.0 - 10.5 K/uL   RBC 3.67 (L) 3.87 - 5.11 MIL/uL   Hemoglobin 12.5 12.0 - 15.0 g/dL   HCT 36.9 36.0 - 46.0 %   MCV 100.5 (H) 80.0 - 100.0 fL   MCH 34.1 (H) 26.0 - 34.0 pg   MCHC 33.9 30.0 - 36.0 g/dL   RDW 12.7 11.5 - 15.5 %   Platelets 226 150 - 400 K/uL   nRBC 0.0  0.0 - 0.2 %  Glucose, capillary  Result Value Ref Range   Glucose-Capillary 180 (H) 70 - 99 mg/dL  Glucose, capillary  Result Value Ref Range   Glucose-Capillary 182 (H) 70 - 99 mg/dL  Glucose, capillary  Result Value Ref Range   Glucose-Capillary 175 (H) 70 - 99 mg/dL  Glucose, capillary  Result Value Ref Range   Glucose-Capillary 157 (H) 70 - 99 mg/dL    Exam - Neurologically intact ABD soft Neurovascular intact Sensation intact distally Intact pulses distally Dorsiflexion/Plantar flexion intact Incision: dressing C/D/I and no drainage No cellulitis present Compartment soft Dressing/Incision - clean, dry, no drainage Motor function intact - moving foot and toes well on exam.   Assessment/Plan: 2 Days Post-Op Procedure(s) (LRB): TOTAL KNEE ARTHROPLASTY (Right)  Advance diet Up with therapy D/C IV fluids Past Medical History:  Diagnosis Date  . Anginal pain ([place])   . 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 Antiphospholipid antibody with hypercoagulable state ([place]) 11/07/2012  . Arthritis   . Asthma     well controlled, no rescue inhaler used  . Blood dyscrasia    anticardiolipid syndrome  . Blood transfusion    age 83 yr old-none since  . Celiac disease   . Diabetes mellitus    Type II  . Diverticulitis   . DVT (deep venous thrombosis) ([place]) 05/01/2011   "to liver"  . Family history of adverse reaction to anesthesia    N&V  . Fibromyalgia   . GERD (gastroesophageal reflux disease)    11/01/16- not problem  . H/O seasonal allergies   . Heart murmur    "not a problem"  . Hypertension   . NASH (nonalcoholic steatohepatitis) 05/01/2011  . Nausea & vomiting    11-17-14 "nausea and vomiting after meals" at present discussed with Dr. Oletta Lamas office per pt.  . Pneumonia 2021  . PONV (postoperative nausea and vomiting)    'I dont have it if they give me something in the IV."  . Portal hypertension ([place])   . Stroke ([place])    tia- eye,  . TIA (transient ischemic attack)   . Varices 05/01/2011  . Varices, esophageal ([place])   . Varices, gastric     DVT Prophylaxis - Eliquis  Weight-Bearing as tolerated to Right leg Possible DC later today to home if progressing well with PT otherwise plan for D/c tomorrow Discussed with Dr. Valentino Saxon Deatra Robinson 07/23/2020, 11:55 AM

## 2020-07-23 NOTE — Care Management Important Message (Signed)
Medicare IM printed for Social Work staff to give to the patient

## 2020-07-23 NOTE — Progress Notes (Signed)
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 PMH: diabetes, DVT, fibromyalgia, HTN, TIA    PT Comments    Pt with significant improvement in pain this session, only requiring 1 person assist and able to ambulate 8 ft in room using RW with min A. Pt able to perform R quad set, but not SLR and continues to have high pain with knee flexion so knee immobilizer donned in supine. No knee buckling noted with KI donned, clears L foot 50% of the time due to decreased R weight-shift. Pt tolerates remaining up in chair at EOS. Cued pt through exercises and provided written/illustrated HEP for pt to perform additional sets this evening pending pain tolerance. Pt less anxious this session, motivated and requiring only intermittent relaxation cues. Pt on 2L O2 and desats to 88% with mobility, improves to >90% with pursed lip breathing cues. Pt's spouse present throughout session offering encouragement. Pt still requiring significant medication for limited mobility due to high pain and would benefit from continued therapeutic interventions to improve strength, gait, and functional transfers to reduce risk for falls. Pt hopeful to d/c home tomorrow; plan to continue education and increase ambulation next session pending pain tolerance.   Follow Up Recommendations  Follow surgeon's recommendation for DC plan and follow-up therapies     Equipment Recommendations  3in1 (PT)    Recommendations for Other Services       Precautions / Restrictions Precautions Precautions: Fall Precaution Comments: high pain, monitor O2 Required Braces or Orthoses: Knee Immobilizer - Right (for pt comfort) Restrictions Weight Bearing Restrictions: No    Mobility  Bed Mobility Overal bed mobility: Needs Assistance Bed Mobility: Supine to Sit  Supine to sit: Min  assist  General bed mobility comments: min A to bring RLE over to EOB and assist with controlled lowering to floor, LLE assisting in hooklying to scoot hips over, pulls on therapist's hand to upright trunk into sitting; R knee immobilizer donned in supine    Transfers Overall transfer level: Needs assistance Equipment used: Rolling walker (2 wheeled) Transfers: Sit to/from [facility] Sit to Stand: Min guard Stand pivot transfers: Min guard  General transfer comment: min G to power to stand with cues for RLE placement and hand placement, pivots to [facility] with slightly increased time ~5 minutes  Ambulation/Gait Ambulation/Gait assistance: Min assist Gait Distance (Feet): 8 Feet Assistive device: Rolling walker (2 wheeled) Gait Pattern/deviations: Step-to pattern;Decreased stance time - right;Decreased weight shift to right;Ataxic Gait velocity: decreased   General Gait Details: short, slow steps with decreased weight-shift R, able to clear LLE 50% of the time and slides foot across floor 50% of the time, cues for pursed lip breathing and relaxation with good carryover   Stairs             Wheelchair Mobility    Modified Rankin (Stroke Patients Only)       Balance Overall balance assessment: Needs assistance Sitting-balance support: Feet supported Sitting balance-Leahy Scale: Good Sitting balance - Comments: seated EOB   Standing balance support: During functional activity;Bilateral upper extremity supported Standing balance-Leahy Scale: Poor Standing balance comment: reliant on UE support     Cognition Arousal/Alertness: Awake/alert Behavior During Therapy: WFL for tasks assessed/performed Overall Cognitive Status: Within Functional Limits for tasks assessed  General Comments: Pt less anxious, more motivated with therapy. Intermittent  relaxation techniques during therapy      Exercises Total Joint Exercises Ankle Circles/Pumps:  Seated;AROM;Strengthening;Both;20 reps Quad Sets: Seated;Strengthening;Right;10 reps Gluteal Sets: Seated;AROM;Strengthening;Both;10 reps Hip ABduction/ADduction: Seated;AROM;Strengthening;Right;10 reps Straight Leg Raises: Seated;AAROM;Right;5 reps (pt unable to clear RLE from seat, assisted ~3" off of mat without increased pain)    General Comments General comments (skin integrity, edema, etc.): Pt on 2L O2, desats to 88% with mobility, cued for pursed lip breathing and recovers to >90% quickly      Pertinent Vitals/Pain Pain Assessment: 0-10 Pain Score: 5  Pain Location: R LE Pain Descriptors / Indicators: Aching;Throbbing;Stabbing;Grimacing;Guarding;Moaning;Pressure Pain Intervention(s): Limited activity within patient's tolerance;Monitored during session;Premedicated before session;Repositioned;Ice applied;Utilized relaxation techniques    Home Living                      Prior Function            PT Goals (current goals can now be found in the care plan section) Acute Rehab PT Goals Patient Stated Goal: "less pain" PT Goal Formulation: With patient/family Time For Goal Achievement: 08/05/20 Potential to Achieve Goals: Good Progress towards PT goals: Progressing toward goals    Frequency    7X/week      PT Plan Current plan remains appropriate    Co-evaluation              AM-PAC PT "6 Clicks" Mobility   Outcome Measure  Help needed turning from your back to your side while in a flat bed without using bedrails?: A Little Help needed moving from lying on your back to sitting on the side of a flat bed without using bedrails?: A Little Help needed moving to and from a bed to a chair (including a wheelchair)?: A Little Help needed standing up from a chair using your arms (e.g., wheelchair or bedside chair)?: A Little Help needed to walk in hospital room?: A Little Help needed climbing 3-5 steps with a railing? : Total 6 Click Score: 16    End of  Session Equipment Utilized During Treatment: Gait belt;Oxygen Activity Tolerance: Patient tolerated treatment well Patient left: in chair;with call bell/phone within reach;with family/visitor present Nurse Communication: Mobility status;Other (comment) (use knee immobilizer for transfer back to bed) PT Visit Diagnosis: Other abnormalities of gait and mobility (R26.89);Muscle weakness (generalized) (M62.81);Pain Pain - Right/Left: Right Pain - part of body: Leg;Knee     Time: 1520-1610 PT Time Calculation (min) (ACUTE ONLY): 50 min  Charges:  $Gait Training: 8-22 mins $Therapeutic Exercise: 8-22 mins $Therapeutic Activity: 8-22 mins                      Tori Orion Vandervort PT, DPT 07/23/20, 4:27 PM

## 2020-07-23 NOTE — TOC Progression Note (Signed)
Transition of Care [facility]) - Progression Note   Patient Details  Name: Laurie Green MRN: [id number] Date of Birth: 21-Dec-1945  Transition of Care [facility]) CM/SW [place], LCSW Phone Number: 07/23/2020, 12:07 PM  Clinical Narrative: Patient provided copy of Medicare IM. CSW updated patient that [facility] Lemm[facility] accepted the HHPT referral. [employer] has delivered the 3N1 to the patient's room. TOC to follow.  Expected Discharge Plan: [place] Barriers to Discharge: Continued Medical Work up  Expected Discharge Plan and Services Expected Discharge Plan: [place] In-house Referral: Clinical Social Work Post Acute Care Choice: [place] Living arrangements for the past 2 months: Single Family Home              DME Arranged: 3-N-1 DME Agency: [employer] HH Arranged: PT  Readmission Risk Interventions No flowsheet data found.

## 2020-07-23 NOTE — Care Management Important Message (Signed)
Important Message  Patient Details  Name: MASA LUBIN MRN: [id number] Date of Birth: 1945/09/17   Medicare Important Message Given:   (Medicare IM printed for Social Work staff to give to the patient)     Sherie Don, LCSW 07/23/2020, 12:10 PM

## 2020-07-24 LAB — CBC
HCT: 32.8 % — ABNORMAL LOW (ref 36.0–46.0)
Hemoglobin: 11 g/dL — ABNORMAL LOW (ref 12.0–15.0)
MCH: 34 pg (ref 26.0–34.0)
MCHC: 33.5 g/dL (ref 30.0–36.0)
MCV: 101.2 fL — ABNORMAL HIGH (ref 80.0–100.0)
Platelets: 211 10*3/uL (ref 150–400)
RBC: 3.24 MIL/uL — ABNORMAL LOW (ref 3.87–5.11)
RDW: 12.6 % (ref 11.5–15.5)
WBC: 11.1 10*3/uL — ABNORMAL HIGH (ref 4.0–10.5)
nRBC: 0 % (ref 0.0–0.2)

## 2020-07-24 LAB — GLUCOSE, CAPILLARY
Glucose-Capillary: 137 mg/dL — ABNORMAL HIGH (ref 70–99)
Glucose-Capillary: 134 mg/dL — ABNORMAL HIGH (ref 70–99)
Glucose-Capillary: 122 mg/dL — ABNORMAL HIGH (ref 70–99)

## 2020-07-24 NOTE — Progress Notes (Signed)
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 PMH: diabetes, DVT, fibromyalgia, HTN, TIA    PT Comments    Pt with significant pain tolerance this session. Pt still reporting 9/10 pain in RLE, but able to stand and ambulate 33' with RW and min G for safety. Pt cued for hand placement and RLE placement with transfers to minimize pain of knee flexion. Pt attempts to increase cadence and step length while ambulating with noted minimal R knee buckling noted so cued to return to step-to pattern with decreased cadence with good results. Pt tolerates therapy without knee immobilizer donned, still very painful and limited but tolerates R knee flexed to ~30 deg. Pt still requiring significant pain medication and reporting high pain with minimal mobility. Also, trialed room air with ambulation and pt desats to 88%, cued for pursed lip breathing but unable to maintain 90% so 2L O2 returned once in roo.- RN aware. Plan to practice steps and continue gait training in afternoon session.    Follow Up Recommendations  Follow surgeon's recommendation for DC plan and follow-up therapies     Equipment Recommendations  3in1 (PT)    Recommendations for Other Services       Precautions / Restrictions Precautions Precautions: Fall Precaution Comments: high pain, monitor O2 Restrictions Weight Bearing Restrictions: No    Mobility  Bed Mobility Overal bed mobility: Needs Assistance Bed Mobility: Supine to Sit  Supine to sit: Min assist  General bed mobility comments: pt slides BLE over to EOB with LLE in hooklying to scoot hips over to SOB, PT provides min A for controlled lowering of RLE to floor to minimize knee flexion for pt comfort    Transfers Overall transfer level: Needs assistance Equipment used: Rolling walker (2 wheeled) Transfers:  Sit to/from Stand Sit to Stand: Min guard  General transfer comment: BUE assisting to power up to stand, ues for RLE placement to minimize knee flexion, slow to rise to stand but improved to <5 minutes  Ambulation/Gait Ambulation/Gait assistance: Min guard Gait Distance (Feet): 75 Feet Assistive device: Rolling walker (2 wheeled) Gait Pattern/deviations: Step-to pattern;Decreased stance time - right;Decreased weight shift to right Gait velocity: decreased   General Gait Details: short, slow steps with decreased weight-shift R, clearing bil feet with each step, attempted to increase step length and cadence with minimal R knee buckling noted so cued to decrease step length and cadence to prevent, denies dizziness, on RA with SpO2 88%   Stairs             Wheelchair Mobility    Modified Rankin (Stroke Patients Only)       Balance Overall balance assessment: Needs assistance Sitting-balance support: Feet supported Sitting balance-Leahy Scale: Good Sitting balance - Comments: seated EOB   Standing balance support: During functional activity;Bilateral upper extremity supported Standing balance-Leahy Scale: Poor Standing balance comment: reliant on UE support     Cognition Arousal/Alertness: Awake/alert Behavior During Therapy: WFL for tasks assessed/performed Overall Cognitive Status: Within Functional Limits for tasks assessed  General Comments: Pt motivated this session, denies anxiety      Exercises      General Comments General comments (skin integrity, edema, etc.): Pt on RA desats to 88% with ambulation, cued for pursed lip breathing and recovers to 90%, but then returns to <90%; denies dizziness, SOB, headache, fatigue. Returned  2L O2 once back in room- RN aware      Pertinent Vitals/Pain Pain Assessment: 0-10 Pain Score: 9  Pain Location: R LE Pain Descriptors / Indicators: Aching;Sore Pain Intervention(s): Limited activity within patient's  tolerance;Monitored during session;Premedicated before session;Repositioned;Ice applied    Home Living                      Prior Function            PT Goals (current goals can now be found in the care plan section) Acute Rehab PT Goals Patient Stated Goal: "less pain" PT Goal Formulation: With patient/family Time For Goal Achievement: 08/05/20 Potential to Achieve Goals: Good Progress towards PT goals: Progressing toward goals    Frequency    7X/week      PT Plan Current plan remains appropriate    Co-evaluation              AM-PAC PT "6 Clicks" Mobility   Outcome Measure  Help needed turning from your back to your side while in a flat bed without using bedrails?: A Little Help needed moving from lying on your back to sitting on the side of a flat bed without using bedrails?: A Little Help needed moving to and from a bed to a chair (including a wheelchair)?: A Little Help needed standing up from a chair using your arms (e.g., wheelchair or bedside chair)?: A Little Help needed to walk in hospital room?: A Little Help needed climbing 3-5 steps with a railing? : A Lot 6 Click Score: 17    End of Session Equipment Utilized During Treatment: Gait belt;Oxygen Activity Tolerance: Patient tolerated treatment well Patient left: in chair;with call bell/phone within reach Nurse Communication: Mobility status PT Visit Diagnosis: Other abnormalities of gait and mobility (R26.89);Muscle weakness (generalized) (M62.81);Pain Pain - Right/Left: Right Pain - part of body: Leg;Knee     Time: 1001-1030 PT Time Calculation (min) (ACUTE ONLY): 29 min  Charges:  $Gait Training: 8-22 mins $Therapeutic Activity: 8-22 mins                      Tori Trust Crago PT, DPT 07/24/20, 11:20 AM

## 2020-07-24 NOTE — Progress Notes (Signed)
During day shift today the patients O2 sats on room air have been in the upper 80's.  No complaints of shortness of breath.  Pt has been encouraged to and has used the incentive spirometer and deep breathing and coughing regularly to improve oxygenation.

## 2020-07-24 NOTE — Progress Notes (Signed)
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 PMH: diabetes, DVT, fibromyalgia, HTN, TIA    PT Comments    Pt tolerates significant increased in activity tolerance, ambulates 249f with RW, step to pattern, no unsteadiness, mild R knee buckling with increased step length and cadence that improves with cues to slow down. Pt declines stair training this session, fatigued and increased pain after gait training so will hold for tomorrow. Pt still requiring cues for hand placement with transfers to improve safety and steadiness. Pt's spouse present during session providing encouragement as needed. Will complete stair training next session and finalize mobility training and education with family prior to d/c home tomorrow.   Follow Up Recommendations  Follow surgeon's recommendation for DC plan and follow-up therapies     Equipment Recommendations  3in1 (PT)    Recommendations for Other Services       Precautions / Restrictions Precautions Precautions: Fall Precaution Comments: high pain, monitor O2 Restrictions Weight Bearing Restrictions: No    Mobility  Bed Mobility Overal bed mobility: Needs Assistance Bed Mobility: Supine to Sit  Supine to sit: Min guard  General bed mobility comments: increased time, able to mobilize RLE off of bed wtih LLE assisting to scoot out to EOB, pt able to lower RLE to floor with tolerabe pain and control    Transfers Overall transfer level: Needs assistance Equipment used: Rolling walker (2 wheeled) Transfers: Sit to/from Stand Sit to Stand: Min guard  General transfer comment: cues for hand placement to power up  Ambulation/Gait Ambulation/Gait assistance: Min guard Gait Distance (Feet): 200 Feet Assistive device: Rolling walker (2 wheeled) Gait Pattern/deviations: Step-to  pattern;Decreased stance time - right;Decreased weight shift to right Gait velocity: decreased   General Gait Details: pt ambulates with decreased weight-shift to R, step-to pattern, clearing bil feet with each step, with increased speed minimal R knee buckling noted so cued to decrease step length and speed to improve, on RA with SpO2 90-94% and cued for pursed lip breathing   Stairs             Wheelchair Mobility    Modified Rankin (Stroke Patients Only)       Balance Overall balance assessment: Needs assistance Sitting-balance support: Feet supported Sitting balance-Leahy Scale: Good Sitting balance - Comments: seated EOB   Standing balance support: During functional activity;Bilateral upper extremity supported Standing balance-Leahy Scale: Poor Standing balance comment: reliant on UE support     Cognition Arousal/Alertness: Awake/alert Behavior During Therapy: WFL for tasks assessed/performed Overall Cognitive Status: Within Functional Limits for tasks assessed  General Comments: Pt motivated this session, denies anxiety      Exercises      General Comments General comments (skin integrity, edema, etc.): Removed 2L O2, pt on RA with SpO2 90-94% with ambulation and cued for pursed lip breathing- pt left on RA and RN notified      Pertinent Vitals/Pain Pain Assessment: 0-10 Pain Score: 10-Worst pain ever Pain Location: R LE Pain Descriptors / Indicators: Aching;Sore Pain Intervention(s): Limited activity within patient's tolerance;Monitored during session;Repositioned;Patient requesting pain meds-RN notified;Ice applied    Home Living                      Prior Function            PT Goals (current goals can  now be found in the care plan section) Acute Rehab PT Goals Patient Stated Goal: "less pain" PT Goal Formulation: With patient/family Time For Goal Achievement: 08/05/20 Potential to Achieve Goals: Good Progress towards PT goals:  Progressing toward goals    Frequency    7X/week      PT Plan Current plan remains appropriate    Co-evaluation              AM-PAC PT "6 Clicks" Mobility   Outcome Measure  Help needed turning from your back to your side while in a flat bed without using bedrails?: A Little Help needed moving from lying on your back to sitting on the side of a flat bed without using bedrails?: A Little Help needed moving to and from a bed to a chair (including a wheelchair)?: A Little Help needed standing up from a chair using your arms (e.g., wheelchair or bedside chair)?: A Little Help needed to walk in hospital room?: A Little Help needed climbing 3-5 steps with a railing? : A Little 6 Click Score: 18    End of Session Equipment Utilized During Treatment: Gait belt Activity Tolerance: Patient tolerated treatment well Patient left: in chair;with call bell/phone within reach;with chair alarm set;with nursing/sitter in room;with family/visitor present Nurse Communication: Mobility status;Other (comment) (left on RA) PT Visit Diagnosis: Other abnormalities of gait and mobility (R26.89);Muscle weakness (generalized) (M62.81);Pain Pain - Right/Left: Right Pain - part of body: Leg;Knee     Time: 1356-1430 PT Time Calculation (min) (ACUTE ONLY): 34 min  Charges:  $Gait Training: 8-22 mins $Therapeutic Activity: 8-22 mins                      Tori Emmaleigh Longo PT, DPT 07/24/20, 2:38 PM

## 2020-07-24 NOTE — Progress Notes (Signed)
Patient ID: Laurie Green, female   DOB: 11-27-1945, 75 y.o.   MRN: [id number] Subjective: 3 Days Post-Op Procedure(s) (LRB): TOTAL KNEE ARTHROPLASTY (Right) Patient reports pain as moderate.    Patient has complaints of R knee pain, improved since yesterday. Not much nausea. Denies numbness or tingling.  Endorsing significant difficulty with mobilization and bending the knee.  Objective: Vital signs in last 24 hours: Temp:  [98.1 F (36.7 C)-99.6 F (37.6 C)] 98.1 F (36.7 C) (04/09 0437) Pulse Rate:  [75-79] 79 (04/09 0437) Resp:  [16] 16 (04/09 0437) BP: (120-129)/(58-65) 123/63 (04/09 0437) SpO2:  [96 %-97 %] 96 % (04/09 0437)  Intake/Output from previous day:  Intake/Output Summary (Last 24 hours) at 07/24/2020 0836 Last data filed at 07/24/2020 0600 Gross per 24 hour  Intake 450 ml  Output 400 ml  Net 50 ml    Intake/Output this shift: No intake/output data recorded.  Labs: Results for orders placed or performed during the hospital encounter of 07/21/20  Glucose, capillary  Result Value Ref Range   Glucose-Capillary 104 (H) 70 - 99 mg/dL  Glucose, capillary  Result Value Ref Range   Glucose-Capillary 115 (H) 70 - 99 mg/dL  Glucose, capillary  Result Value Ref Range   Glucose-Capillary 125 (H) 70 - 99 mg/dL  Hemoglobin A1c  Result Value Ref Range   Hgb A1c MFr Bld 7.3 (H) 4.8 - 5.6 %   Mean Plasma Glucose 162.81 mg/dL  Glucose, capillary  Result Value Ref Range   Glucose-Capillary 171 (H) 70 - 99 mg/dL  CBC  Result Value Ref Range   WBC 14.4 (H) 4.0 - 10.5 K/uL   RBC 3.50 (L) 3.87 - 5.11 MIL/uL   Hemoglobin 11.9 (L) 12.0 - 15.0 g/dL   HCT 35.5 (L) 36.0 - 46.0 %   MCV 101.4 (H) 80.0 - 100.0 fL   MCH 34.0 26.0 - 34.0 pg   MCHC 33.5 30.0 - 36.0 g/dL   RDW 13.1 11.5 - 15.5 %   Platelets 212 150 - 400 K/uL   nRBC 0.0 0.0 - 0.2 %  Basic metabolic panel  Result Value Ref Range   Sodium 135 135 - 145 mmol/L   Potassium 4.2 3.5 - 5.1 mmol/L   Chloride 99 98 -  111 mmol/L   CO2 27 22 - 32 mmol/L   Glucose, Bld 241 (H) 70 - 99 mg/dL   BUN 21 8 - 23 mg/dL   Creatinine, Ser 0.63 0.44 - 1.00 mg/dL   Calcium 8.5 (L) 8.9 - 10.3 mg/dL   GFR, Estimated >60 >60 mL/min   Anion gap 9 5 - 15  Glucose, capillary  Result Value Ref Range   Glucose-Capillary 213 (H) 70 - 99 mg/dL  Glucose, capillary  Result Value Ref Range   Glucose-Capillary 190 (H) 70 - 99 mg/dL  Glucose, capillary  Result Value Ref Range   Glucose-Capillary 229 (H) 70 - 99 mg/dL  Glucose, capillary  Result Value Ref Range   Glucose-Capillary 195 (H) 70 - 99 mg/dL  CBC  Result Value Ref Range   WBC 13.7 (H) 4.0 - 10.5 K/uL   RBC 3.67 (L) 3.87 - 5.11 MIL/uL   Hemoglobin 12.5 12.0 - 15.0 g/dL   HCT 36.9 36.0 - 46.0 %   MCV 100.5 (H) 80.0 - 100.0 fL   MCH 34.1 (H) 26.0 - 34.0 pg   MCHC 33.9 30.0 - 36.0 g/dL   RDW 12.7 11.5 - 15.5 %   Platelets 226 150 -  400 K/uL   nRBC 0.0 0.0 - 0.2 %  Glucose, capillary  Result Value Ref Range   Glucose-Capillary 180 (H) 70 - 99 mg/dL  Glucose, capillary  Result Value Ref Range   Glucose-Capillary 182 (H) 70 - 99 mg/dL  Glucose, capillary  Result Value Ref Range   Glucose-Capillary 175 (H) 70 - 99 mg/dL  Glucose, capillary  Result Value Ref Range   Glucose-Capillary 157 (H) 70 - 99 mg/dL  CBC  Result Value Ref Range   WBC 11.1 (H) 4.0 - 10.5 K/uL   RBC 3.24 (L) 3.87 - 5.11 MIL/uL   Hemoglobin 11.0 (L) 12.0 - 15.0 g/dL   HCT 32.8 (L) 36.0 - 46.0 %   MCV 101.2 (H) 80.0 - 100.0 fL   MCH 34.0 26.0 - 34.0 pg   MCHC 33.5 30.0 - 36.0 g/dL   RDW 12.6 11.5 - 15.5 %   Platelets 211 150 - 400 K/uL   nRBC 0.0 0.0 - 0.2 %  Glucose, capillary  Result Value Ref Range   Glucose-Capillary 155 (H) 70 - 99 mg/dL  Glucose, capillary  Result Value Ref Range   Glucose-Capillary 217 (H) 70 - 99 mg/dL  Glucose, capillary  Result Value Ref Range   Glucose-Capillary 137 (H) 70 - 99 mg/dL    Exam - Neurologically intact ABD soft Neurovascular  intact Sensation intact distally Intact pulses distally Dorsiflexion/Plantar flexion intact Incision: dressing C/D/I and no drainage No cellulitis present Compartment soft Dressing/Incision - clean, dry, no drainage Motor function intact - moving foot and toes well on exam.   Assessment/Plan: 3 Days Post-Op Procedure(s) (LRB): TOTAL KNEE ARTHROPLASTY (Right)  Advance diet Up with therapy D/C IV fluids Past Medical History:  Diagnosis Date  . Anginal pain ([place])   . 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 Antiphospholipid antibody with hypercoagulable state ([place]) 11/07/2012  . Arthritis   . Asthma    well controlled, no rescue inhaler used  . Blood dyscrasia    anticardiolipid syndrome  . Blood transfusion    age 36 yr old-none since  . Celiac disease   . Diabetes mellitus    Type II  . Diverticulitis   . DVT (deep venous thrombosis) ([place]) 05/01/2011   "to liver"  . Family history of adverse reaction to anesthesia    N&V  . Fibromyalgia   . GERD (gastroesophageal reflux disease)    11/01/16- not problem  . H/O seasonal allergies   . Heart murmur    "not a problem"  . Hypertension   . NASH (nonalcoholic steatohepatitis) 05/01/2011  . Nausea & vomiting    11-17-14 "nausea and vomiting after meals" at present discussed with Dr. Oletta Lamas office per pt.  . Pneumonia 2021  . PONV (postoperative nausea and vomiting)    'I dont have it if they give me something in the IV."  . Portal hypertension ([place])   . Stroke ([place])    tia- eye,  . TIA (transient ischemic attack)   . Varices 05/01/2011  . Varices, esophageal ([place])   . Varices, gastric     DVT Prophylaxis - Eliquis  Weight-Bearing as tolerated to Right leg Continue with PT otherwise plan for D/c tomorrow   Nicholes Stairs 07/24/2020, 8:36 AM

## 2020-07-25 LAB — GLUCOSE, CAPILLARY
Glucose-Capillary: 68 mg/dL — ABNORMAL LOW (ref 70–99)
Glucose-Capillary: 121 mg/dL — ABNORMAL HIGH (ref 70–99)

## 2020-07-25 NOTE — Progress Notes (Signed)
Spoke with Cleta Alberts, PA about patient's blood sugar of 68 and CBG re-check: 121. Verbal order per Cleta Alberts, PA: Hold this morning's insulin dose.

## 2020-07-25 NOTE — Plan of Care (Signed)
Pt stable at time of discharge instructions and education. No questions on d/c instructions. Pt dressing dry and intact.

## 2020-07-25 NOTE — Progress Notes (Addendum)
Subjective: 4 Days Post-Op Procedure(s) (LRB): TOTAL KNEE ARTHROPLASTY (Right) Patient reports pain as moderate.   Tolerating PO. No nausea or vomiting.  +void, +flatus No CP, SOB, fevers/sweats/chills.  Patient states she wants to go home after PT today. She has 3 steps to get into her house, but states Landscape architect bedroom is on the main floor.   Objective: Vital signs in last 24 hours: Temp:  [98.2 F (36.8 C)-98.4 F (36.9 C)] 98.2 F (36.8 C) (04/10 0526) Pulse Rate:  [73-80] 75 (04/10 0526) Resp:  [16] 16 (04/10 0526) BP: (128-134)/(60-62) 128/62 (04/10 0526) SpO2:  [87 %-99 %] 96 % (04/10 0526)  Intake/Output from previous day: 04/09 0701 - 04/10 0700 In: 1090 [P.O.:1090] Out: 400 [Urine:400] Intake/Output this shift: No intake/output data recorded.  Recent Labs    07/23/20 0305 07/24/20 0300  HGB 12.5 11.0*   Recent Labs    07/23/20 0305 07/24/20 0300  WBC 13.7* 11.1*  RBC 3.67* 3.24*  HCT 36.9 32.8*  PLT 226 211   No results for input(s): NA, K, CL, CO2, BUN, CREATININE, GLUCOSE, CALCIUM in the last 72 hours. No results for input(s): LABPT, INR in the last 72 hours.  Neurologically intact ABD soft Neurovascular intact Sensation intact distally Intact pulses distally Dorsiflexion/Plantar flexion intact Incision: dressing C/D/I and no drainage No cellulitis present Compartment soft   Assessment/Plan: 4 Days Post-Op Procedure(s) (LRB): TOTAL KNEE ARTHROPLASTY (Right) Advance diet Up with therapy Discharge home with home health after PT this am Hx of DVT on eliquis, has IVC filter.   Blood sugar dropped into 60s this am. Recheck after breakfast was 120s. Will plan to hold am insulin dose.      Laurie Green 07/25/2020, 8:43 AM

## 2020-07-25 NOTE — Progress Notes (Signed)
The patient is alert and oriented and has been seen by her physician. The orders for discharge were written. IV has been removed Marijean Bravo, Advertising copywriter . Charge RN went over discharge instructions with patient. She is being discharged via wheelchair with all of her belongings.

## 2020-07-25 NOTE — Progress Notes (Signed)
Hypoglycemic Event  CBG: 68 at 7:28 AM  Treatment: Patient ate breakfast and had gingerale.   Symptoms: No symptoms  Follow-up CBG: Time: 8:27 am CBG Result:121  Possible Reasons for Event: Patient had not had breakfast yet when CBG was 68.   Comments/MD notified Cleta Alberts, PA    Anda Kraft

## 2020-07-25 NOTE — Plan of Care (Signed)
  Problem: Health Behavior/Discharge Planning: Goal: Ability to manage health-related needs will improve Outcome: Progressing   Problem: Activity: Goal: Risk for activity intolerance will decrease Outcome: Progressing   Problem: Pain Managment: Goal: General experience of comfort will improve Outcome: Progressing

## 2020-07-25 NOTE — Progress Notes (Signed)
Physical Therapy Treatment Patient Details Name: Laurie Green MRN: [id number] DOB: 05-07-1945 Today's Date: 07/25/2020    History of Present Illness Laurie Green is a 75 yo female s/p R TKA 07/21/20. PMH: diabetes, DVT, fibromyalgia, HTN, TIA    PT Comments    Pt continues to progress well with mobility. Reviewed/practiced stair negotiation this session. All education completed. Okay to d/c from PT standpoint.    Follow Up Recommendations  Follow surgeon's recommendation for DC plan and follow-up therapies     Equipment Recommendations  3in1 (PT)    Recommendations for Other Services       Precautions / Restrictions Precautions Precautions: Fall Required Braces or Orthoses: Knee Immobilizer - Right (for comfort) Restrictions Weight Bearing Restrictions: No RLE Weight Bearing: Weight bearing as tolerated    Mobility  Bed Mobility Overal bed mobility: Needs Assistance Bed Mobility: Supine to Sit;Sit to Supine     Supine to sit: Min guard;HOB elevated Sit to supine: Min assist   General bed mobility comments: Assist for R LE onto bed. Increased time.    Transfers Overall transfer level: Needs assistance Equipment used: Rolling walker (2 wheeled) Transfers: Sit to/from Stand Sit to Stand: Min guard;From elevated surface         General transfer comment: cues for hand placement to power up  Ambulation/Gait Ambulation/Gait assistance: Min guard Gait Distance (Feet): 75 Feet Assistive device: Rolling walker (2 wheeled) Gait Pattern/deviations: Step-to pattern;Antalgic;Decreased stance time - right;Decreased weight shift to right     General Gait Details: Min guard for safety. Slow gait speed. O2 >90% on RA   Stairs Stairs: Yes Stairs assistance: Min assist Stair Management: Backwards;With walker;Step to pattern Number of Stairs: 2 General stair comments: VCs safety, technique, sequence. Assist to stabilize pt and RW.   Wheelchair Mobility     Modified Rankin (Stroke Patients Only)       Balance Overall balance assessment: Needs assistance         Standing balance support: Bilateral upper extremity supported Standing balance-Leahy Scale: Poor                              Cognition Arousal/Alertness: Awake/alert Behavior During Therapy: WFL for tasks assessed/performed Overall Cognitive Status: Within Functional Limits for tasks assessed                                        Exercises Total Joint Exercises Ankle Circles/Pumps: AROM;Both;10 reps Quad Sets: AROM;Both;10 reps Heel Slides: AAROM;Right;10 reps Hip ABduction/ADduction: AAROM;Right;10 reps Straight Leg Raises: AAROM;Right;10 reps    General Comments        Pertinent Vitals/Pain Pain Assessment: 0-10 Pain Score: 5  Pain Location: R LE Pain Descriptors / Indicators: Aching;Sore Pain Intervention(s): Monitored during session;Repositioned;Ice applied    Home Living                      Prior Function            PT Goals (current goals can now be found in the care plan section) Progress towards PT goals: Progressing toward goals    Frequency    7X/week      PT Plan Current plan remains appropriate    Co-evaluation              AM-PAC PT "6 Clicks" Mobility  Outcome Measure  Help needed turning from your back to your side while in a flat bed without using bedrails?: A Little Help needed moving from lying on your back to sitting on the side of a flat bed without using bedrails?: A Little Help needed moving to and from a bed to a chair (including a wheelchair)?: A Little Help needed standing up from a chair using your arms (e.g., wheelchair or bedside chair)?: A Little Help needed to walk in hospital room?: A Little Help needed climbing 3-5 steps with a railing? : A Little 6 Click Score: 18    End of Session Equipment Utilized During Treatment: Gait belt;Right knee  immobilizer Activity Tolerance: Patient tolerated treatment well Patient left: in bed;with call bell/phone within reach;with bed alarm set   PT Visit Diagnosis: Other abnormalities of gait and mobility (R26.89);Pain Pain - Right/Left: Right Pain - part of body: Knee     Time: 8882-8003 PT Time Calculation (min) (ACUTE ONLY): 36 min  Charges:  $Gait Training: 8-22 mins $Therapeutic Exercise: 8-22 mins                         Doreatha Massed, PT Acute Rehabilitation  Office: [phone] Pager: [phone]

## 2020-07-26 LAB — GLUCOSE, CAPILLARY: Glucose-Capillary: 159 mg/dL — ABNORMAL HIGH (ref 70–99)

## 2020-07-26 NOTE — Discharge Summary (Signed)
Physician Discharge Summary   Patient ID: Laurie Green MRN: [id number] DOB/AGE: 10/10/1945 75 y.o.  Admit date: 07/21/2020 Discharge date: 07/25/2020  Primary Diagnosis: primary osteoarthritis right knee  Admission Diagnoses:  Past Medical History:  Diagnosis Date  . Anginal pain ([place])   . 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 Antiphospholipid antibody with hypercoagulable state ([place]) 11/07/2012  . Arthritis   . Asthma    well controlled, no rescue inhaler used  . Blood dyscrasia    anticardiolipid syndrome  . Blood transfusion    age 7 yr old-none since  . Celiac disease   . Diabetes mellitus    Type II  . Diverticulitis   . DVT (deep venous thrombosis) ([place]) 05/01/2011   "to liver"  . Family history of adverse reaction to anesthesia    N&V  . Fibromyalgia   . GERD (gastroesophageal reflux disease)    11/01/16- not problem  . H/O seasonal allergies   . Heart murmur    "not a problem"  . Hypertension   . NASH (nonalcoholic steatohepatitis) 05/01/2011  . Nausea & vomiting    11-17-14 "nausea and vomiting after meals" at present discussed with Dr. Oletta Lamas office per pt.  . Pneumonia 2021  . PONV (postoperative nausea and vomiting)    'I dont have it if they give me something in the IV."  . Portal hypertension ([place])   . Stroke ([place])    tia- eye,  . TIA (transient ischemic attack)   . Varices 05/01/2011  . Varices, esophageal ([place])   . Varices, gastric    Discharge Diagnoses:   Active Problems:   Right knee DJD  Estimated body mass index is 24.8 kg/m as calculated from the following:   Height as of this encounter: 5' 3"  (1.6 m).   Weight as of this encounter: 63.5 kg.  Procedure:  Procedure(s) (LRB): TOTAL KNEE ARTHROPLASTY (Right)   Consults: None  HPI: see H&P Laboratory Data: Admission on 07/21/2020, Discharged on 07/25/2020  Component Date Value Ref Range Status  . Glucose-Capillary 07/21/2020 104* 70 - 99  mg/dL Final   Glucose reference range applies only to samples taken after fasting for at least 8 hours.  . Glucose-Capillary 07/21/2020 115* 70 - 99 mg/dL Final   Glucose reference range applies only to samples taken after fasting for at least 8 hours.  . Glucose-Capillary 07/21/2020 125* 70 - 99 mg/dL Final   Glucose reference range applies only to samples taken after fasting for at least 8 hours.  . Hgb A1c MFr Bld 07/21/2020 7.3* 4.8 - 5.6 % Final   Comment: (NOTE) Pre diabetes:          5.7%-6.4%  Diabetes:              >6.4%  Glycemic control for   <7.0% adults with diabetes   . Mean Plasma Glucose 07/21/2020 162.81  mg/dL Final   Performed at [facility] [address] [place], [place] [address]  . Glucose-Capillary 07/21/2020 171* 70 - 99 mg/dL Final   Glucose reference range applies only to samples taken after fasting for at least 8 hours.  . WBC 07/22/2020 14.4* 4.0 - 10.5 K/uL Final  . RBC 07/22/2020 3.50* 3.87 - 5.11 MIL/uL Final  . Hemoglobin 07/22/2020 11.9* 12.0 - 15.0 g/dL Final  . HCT 07/22/2020 35.5* 36.0 - 46.0 % Final  . MCV 07/22/2020 101.4* 80.0 - 100.0 fL Final  . MCH 07/22/2020 34.0  26.0 -  34.0 pg Final  . MCHC 07/22/2020 33.5  30.0 - 36.0 g/dL Final  . RDW 07/22/2020 13.1  11.5 - 15.5 % Final  . Platelets 07/22/2020 212  150 - 400 K/uL Final  . nRBC 07/22/2020 0.0  0.0 - 0.2 % Final   Performed at [facility], [place] [address], [place], [place] [address]  . Sodium 07/22/2020 135  135 - 145 mmol/L Final  . Potassium 07/22/2020 4.2  3.5 - 5.1 mmol/L Final  . Chloride 07/22/2020 99  98 - 111 mmol/L Final  . CO2 07/22/2020 27  22 - 32 mmol/L Final  . Glucose, Bld 07/22/2020 241* 70 - 99 mg/dL Final   Glucose reference range applies only to samples taken after fasting for at least 8 hours.  . BUN 07/22/2020 21  8 - 23 mg/dL Final  . Creatinine, Ser 07/22/2020 0.63  0.44 - 1.00 mg/dL Final  . Calcium 07/22/2020 8.5* 8.9 - 10.3 mg/dL  Final  . GFR, Estimated 07/22/2020 >60  >60 mL/min Final   Comment: (NOTE) Calculated using the CKD-EPI Creatinine Equation (2021)   . Anion gap 07/22/2020 9  5 - 15 Final   Performed at [facility], [place] [address] [place], [place] [address]  . Glucose-Capillary 07/21/2020 213* 70 - 99 mg/dL Final   Glucose reference range applies only to samples taken after fasting for at least 8 hours.  . Glucose-Capillary 07/22/2020 190* 70 - 99 mg/dL Final   Glucose reference range applies only to samples taken after fasting for at least 8 hours.  . Glucose-Capillary 07/22/2020 229* 70 - 99 mg/dL Final   Glucose reference range applies only to samples taken after fasting for at least 8 hours.  . Glucose-Capillary 07/22/2020 195* 70 - 99 mg/dL Final   Glucose reference range applies only to samples taken after fasting for at least 8 hours.  . WBC 07/23/2020 13.7* 4.0 - 10.5 K/uL Final  . RBC 07/23/2020 3.67* 3.87 - 5.11 MIL/uL Final  . Hemoglobin 07/23/2020 12.5  12.0 - 15.0 g/dL Final  . HCT 07/23/2020 36.9  36.0 - 46.0 % Final  . MCV 07/23/2020 100.5* 80.0 - 100.0 fL Final  . MCH 07/23/2020 34.1* 26.0 - 34.0 pg Final  . MCHC 07/23/2020 33.9  30.0 - 36.0 g/dL Final  . RDW 07/23/2020 12.7  11.5 - 15.5 % Final  . Platelets 07/23/2020 226  150 - 400 K/uL Final  . nRBC 07/23/2020 0.0  0.0 - 0.2 % Final   Performed at [facility], [place] [address], [place], [place] [address]  . Glucose-Capillary 07/22/2020 180* 70 - 99 mg/dL Final   Glucose reference range applies only to samples taken after fasting for at least 8 hours.  . Glucose-Capillary 07/22/2020 182* 70 - 99 mg/dL Final   Glucose reference range applies only to samples taken after fasting for at least 8 hours.  . Glucose-Capillary 07/23/2020 175* 70 - 99 mg/dL Final   Glucose reference range applies only to samples taken after fasting for at least 8 hours.  . Glucose-Capillary 07/23/2020 157* 70 - 99 mg/dL  Final   Glucose reference range applies only to samples taken after fasting for at least 8 hours.  . WBC 07/24/2020 11.1* 4.0 - 10.5 K/uL Final  . RBC 07/24/2020 3.24* 3.87 - 5.11 MIL/uL Final  . Hemoglobin 07/24/2020 11.0* 12.0 - 15.0 g/dL Final  . HCT 07/24/2020 32.8* 36.0 - 46.0 % Final  . MCV 07/24/2020 101.2* 80.0 -  100.0 fL Final  . MCH 07/24/2020 34.0  26.0 - 34.0 pg Final  . MCHC 07/24/2020 33.5  30.0 - 36.0 g/dL Final  . RDW 07/24/2020 12.6  11.5 - 15.5 % Final  . Platelets 07/24/2020 211  150 - 400 K/uL Final  . nRBC 07/24/2020 0.0  0.0 - 0.2 % Final   Performed at [facility], [place] [address], [place], [place] [address]  . Glucose-Capillary 07/23/2020 155* 70 - 99 mg/dL Final   Glucose reference range applies only to samples taken after fasting for at least 8 hours.  . Glucose-Capillary 07/23/2020 217* 70 - 99 mg/dL Final   Glucose reference range applies only to samples taken after fasting for at least 8 hours.  . Glucose-Capillary 07/24/2020 137* 70 - 99 mg/dL Final   Glucose reference range applies only to samples taken after fasting for at least 8 hours.  . Glucose-Capillary 07/24/2020 134* 70 - 99 mg/dL Final   Glucose reference range applies only to samples taken after fasting for at least 8 hours.  . Glucose-Capillary 07/24/2020 122* 70 - 99 mg/dL Final   Glucose reference range applies only to samples taken after fasting for at least 8 hours.  . Glucose-Capillary 07/25/2020 68* 70 - 99 mg/dL Final   Glucose reference range applies only to samples taken after fasting for at least 8 hours.  . Glucose-Capillary 07/25/2020 121* 70 - 99 mg/dL Final   Glucose reference range applies only to samples taken after fasting for at least 8 hours.  . Glucose-Capillary 07/24/2020 159* 70 - 99 mg/dL Final   Glucose reference range applies only to samples taken after fasting for at least 8 hours.  Hospital Outpatient Visit on 07/19/2020  Component Date Value Ref  Range Status  . SARS Coronavirus 2 07/19/2020 NEGATIVE  NEGATIVE Final   Comment: (NOTE) SARS-CoV-2 target nucleic acids are NOT DETECTED.  The SARS-CoV-2 RNA is generally detectable in upper and lower respiratory specimens during the acute phase of infection. Negative results do not preclude SARS-CoV-2 infection, do not rule out co-infections with other pathogens, and should not be used as the sole basis for treatment or other patient management decisions. Negative results must be combined with clinical observations, patient history, and epidemiological information. The expected result is Negative.  Fact Sheet for Patients: SugarRoll.be  Fact Sheet for Healthcare Providers: https://www.woods-mathews.com/  This test is not yet approved or cleared by the [place] FDA and  has been authorized for detection and/or diagnosis of SARS-CoV-2 by FDA under an Emergency Use Authorization (EUA). This EUA will remain  in effect (meaning this test can be used) for the duration of the COVID-19 declaration under Se                          ction 564(b)(1) of the Act, 21 U.S.C. section 360bbb-3(b)(1), unless the authorization is terminated or revoked sooner.  Performed at [facility], [place] [address], [place], [place] [address]   Admission on 07/19/2020, Discharged on 07/19/2020  Component Date Value Ref Range Status  . Sodium 07/19/2020 141  135 - 145 mmol/L Final  . Potassium 07/19/2020 3.9  3.5 - 5.1 mmol/L Final  . Chloride 07/19/2020 103  98 - 111 mmol/L Final  . BUN 07/19/2020 23  8 - 23 mg/dL Final  . Creatinine, Ser 07/19/2020 0.70  0.44 - 1.00 mg/dL Final  . Glucose, Bld 07/19/2020 187* 70 - 99 mg/dL Final  Glucose reference range applies only to samples taken after fasting for at least 8 hours.  . Calcium, Ion 07/19/2020 1.14* 1.15 - 1.40 mmol/L Final  . TCO2 07/19/2020 32  22 - 32 mmol/L Final  . Hemoglobin 07/19/2020 13.9   12.0 - 15.0 g/dL Final  . HCT 07/19/2020 41.0  36.0 - 46.0 % Final  Hospital Outpatient Visit on 07/16/2020  Component Date Value Ref Range Status  . SARS Coronavirus 2 07/16/2020 NEGATIVE  NEGATIVE Final   Comment: (NOTE) SARS-CoV-2 target nucleic acids are NOT DETECTED.  The SARS-CoV-2 RNA is generally detectable in upper and lower respiratory specimens during the acute phase of infection. Negative results do not preclude SARS-CoV-2 infection, do not rule out co-infections with other pathogens, and should not be used as the sole basis for treatment or other patient management decisions. Negative results must be combined with clinical observations, patient history, and epidemiological information. The expected result is Negative.  Fact Sheet for Patients: SugarRoll.be  Fact Sheet for Healthcare Providers: https://www.woods-mathews.com/  This test is not yet approved or cleared by the [place] FDA and  has been authorized for detection and/or diagnosis of SARS-CoV-2 by FDA under an Emergency Use Authorization (EUA). This EUA will remain  in effect (meaning this test can be used) for the duration of the COVID-19 declaration under Se                          ction 564(b)(1) of the Act, 21 U.S.C. section 360bbb-3(b)(1), unless the authorization is terminated or revoked sooner.  Performed at [facility], [place] [address] [place], [place] [address]   Hospital Outpatient Visit on 07/16/2020  Component Date Value Ref Range Status  . MRSA, PCR 07/16/2020 NEGATIVE  NEGATIVE Final  . Staphylococcus aureus 07/16/2020 POSITIVE* NEGATIVE Final   Comment: (NOTE) The Xpert SA Assay (FDA approved for NASAL specimens in patients 73 years of age and older), is one component of a comprehensive surveillance program. It is not intended to diagnose infection nor to guide or monitor treatment. Performed at [facility], [place]  [address] [place], [place] [address]   . WBC 07/16/2020 8.4  4.0 - 10.5 K/uL Final  . RBC 07/16/2020 4.49  3.87 - 5.11 MIL/uL Final  . Hemoglobin 07/16/2020 15.1* 12.0 - 15.0 g/dL Final  . HCT 07/16/2020 44.6  36.0 - 46.0 % Final  . MCV 07/16/2020 99.3  80.0 - 100.0 fL Final  . MCH 07/16/2020 33.6  26.0 - 34.0 pg Final  . MCHC 07/16/2020 33.9  30.0 - 36.0 g/dL Final  . RDW 07/16/2020 12.6  11.5 - 15.5 % Final  . Platelets 07/16/2020 303  150 - 400 K/uL Final  . nRBC 07/16/2020 0.0  0.0 - 0.2 % Final   Performed at [facility], [place] [address], [place], [place] [address]  . Sodium 07/16/2020 142  135 - 145 mmol/L Final  . Potassium 07/16/2020 4.4  3.5 - 5.1 mmol/L Final  . Chloride 07/16/2020 104  98 - 111 mmol/L Final  . CO2 07/16/2020 30  22 - 32 mmol/L Final  . Glucose, Bld 07/16/2020 117* 70 - 99 mg/dL Final   Glucose reference range applies only to samples taken after fasting for at least 8 hours.  . BUN 07/16/2020 21  8 - 23 mg/dL Final  . Creatinine, Ser 07/16/2020 0.56  0.44 - 1.00 mg/dL Final  . Calcium 07/16/2020 9.6  8.9 -  10.3 mg/dL Final  . GFR, Estimated 07/16/2020 >60  >60 mL/min Final   Comment: (NOTE) Calculated using the CKD-EPI Creatinine Equation (2021)   . Anion gap 07/16/2020 8  5 - 15 Final   Performed at [facility], [place] [address] [place], [place] [address]  . Prothrombin Time 07/16/2020 17.2* 11.4 - 15.2 seconds Final  . INR 07/16/2020 1.5* 0.8 - 1.2 Final   Comment: (NOTE) INR goal varies based on device and disease states. Performed at [facility], [place] [address] [place], [place] [address]   . aPTT 07/16/2020 31  24 - 36 seconds Final   Performed at [facility], [place] [address], [place], [place] [address]  . Color, Urine 07/16/2020 STRAW* YELLOW Final  . APPearance 07/16/2020 CLEAR  CLEAR Final  . Specific Gravity, Urine 07/16/2020 1.008  1.005 - 1.030 Final  . pH  07/16/2020 7.0  5.0 - 8.0 Final  . Glucose, UA 07/16/2020 NEGATIVE  NEGATIVE mg/dL Final  . Hgb urine dipstick 07/16/2020 NEGATIVE  NEGATIVE Final  . Bilirubin Urine 07/16/2020 NEGATIVE  NEGATIVE Final  . Ketones, ur 07/16/2020 NEGATIVE  NEGATIVE mg/dL Final  . Protein, ur 07/16/2020 NEGATIVE  NEGATIVE mg/dL Final  . Nitrite 07/16/2020 NEGATIVE  NEGATIVE Final  . [place] Guest 07/16/2020 NEGATIVE  NEGATIVE Final   Performed at [place] [facility] [address] [place], [place] [address]  Appointment on 06/03/2020  Component Date Value Ref Range Status  . WBC Count 06/03/2020 8.5  4.0 - 10.5 K/uL Final  . RBC 06/03/2020 4.31  3.87 - 5.11 MIL/uL Final  . Hemoglobin 06/03/2020 14.9  12.0 - 15.0 g/dL Final  . HCT 06/03/2020 42.1  36.0 - 46.0 % Final  . MCV 06/03/2020 97.7  80.0 - 100.0 fL Final  . MCH 06/03/2020 34.6* 26.0 - 34.0 pg Final  . MCHC 06/03/2020 35.4  30.0 - 36.0 g/dL Final  . RDW 06/03/2020 12.4  11.5 - 15.5 % Final  . Platelet Count 06/03/2020 290  150 - 400 K/uL Final  . nRBC 06/03/2020 0.0  0.0 - 0.2 % Final  . Neutrophils Relative % 06/03/2020 63  % Final  . Neutro Abs 06/03/2020 5.4  1.7 - 7.7 K/uL Final  . Lymphocytes Relative 06/03/2020 19  % Final  . Lymphs Abs 06/03/2020 1.6  0.7 - 4.0 K/uL Final  . Monocytes Relative 06/03/2020 10  % Final  . Monocytes Absolute 06/03/2020 0.8  0.1 - 1.0 K/uL Final  . Eosinophils Relative 06/03/2020 7  % Final  . Eosinophils Absolute 06/03/2020 0.6* 0.0 - 0.5 K/uL Final  . Basophils Relative 06/03/2020 1  % Final  . Basophils Absolute 06/03/2020 0.1  0.0 - 0.1 K/uL Final  . Immature Granulocytes 06/03/2020 0  % Final  . Abs Immature Granulocytes 06/03/2020 0.02  0.00 - 0.07 K/uL Final   Performed at [facility] at [facility], [address], [place],  [address]  . Sodium 06/03/2020 144  135 - 145 mmol/L Final  . Potassium 06/03/2020 4.8  3.5 - 5.1 mmol/L Final  . Chloride  06/03/2020 105  98 - 111 mmol/L Final  . CO2 06/03/2020 33* 22 - 32 mmol/L Final  . Glucose, Bld 06/03/2020 160* 70 - 99 mg/dL Final   Glucose reference range applies only to samples taken after fasting for at least 8 hours.  . BUN 06/03/2020 23  8 - 23 mg/dL Final  . Creatinine 06/03/2020  0.77  0.44 - 1.00 mg/dL Final  . Calcium 06/03/2020 10.3  8.9 - 10.3 mg/dL Final  . Total Protein 06/03/2020 6.7  6.5 - 8.1 g/dL Final  . Albumin 06/03/2020 4.2  3.5 - 5.0 g/dL Final  . AST 06/03/2020 19  15 - 41 U/L Final  . ALT 06/03/2020 18  0 - 44 U/L Final  . Alkaline Phosphatase 06/03/2020 66  38 - 126 U/L Final  . Total Bilirubin 06/03/2020 1.3* 0.3 - 1.2 mg/dL Final  . GFR, Estimated 06/03/2020 >60  >60 mL/min Final   Comment: (NOTE) Calculated using the CKD-EPI Creatinine Equation (2021)   . Anion gap 06/03/2020 6  5 - 15 Final   Performed at [facility] at [facility], [address], [place], [place] [address]  . LDH 06/03/2020 192  98 - 192 U/L Final   Performed at [facility] at [facility], [place], [place], [place] [address]     X-Rays:DG Knee 1-2 Views Right  Result Date: 07/21/2020 CLINICAL DATA:  Postoperative right knee replacement EXAM: RIGHT KNEE - 1-2 VIEW COMPARISON:  None. FINDINGS: Status post right knee total arthroplasty with expected overlying postoperative findings. No evidence of perihardware fracture or component malpositioning. IMPRESSION: Status post right knee total arthroplasty with expected overlying postoperative findings. No evidence of perihardware fracture or component malpositioning. Electronically Signed   By: Eddie Candle M.D.   On: 07/21/2020 12:47   PERIPHERAL VASCULAR CATHETERIZATION  Result Date: 07/19/2020 Patient name: Laurie Green MRN: [id number] DOB: 03/04/1946 Sex: female 07/19/2020 Pre-operative Diagnosis: History of DVT, need for right total knee arthroplasty Post-operative diagnosis:   Same Surgeon:  Eda Paschal. Donzetta Matters, MD Procedure Performed: 1.  Ultrasound-guided cannulation right common femoral vein 2.  IVC venogram 3.  Placement of Cook celect IVC filter 4.  Moderate sedation with fentanyl Versed for 6 minutes Indications: 75 year old female with history of multiple DVTs in the past.  She is now indicated for right total knee arthroplasty.  She is indicated for IVC filter as she hold her Eliquis for the procedure. Findings: The IVC was patent and less than 20 mm.  Filter was placed with a hook at the level of the left renal vein.  Right renal vein was much higher than the left. We will plan to see her back in 3 months to evaluate for IVC filter removal  Procedure:  The patient was identified in the holding area and taken to room 8.  The patient was then placed supine on the table and prepped and draped in the usual sterile fashion.  A time out was called.  Ultrasound was used to evaluate the right common femoral vein which was noted to be patent and compressible.  The area was anesthetized with 1% tetracaine.  Moderate sedation with fentanyl and Versed was administered and her vital signs were monitored throughout the course.  We cannulated the tongue femoral vein with micropuncture needle followed by wire sheath.  A Bentson wire was placed into the IVC.  We serially dilated the tract up to 10 [place].  We then placed the introducer sheath.  Central venogram was performed.  Filter was placed the level of the left renal vein.  Sheath was removed pressure held till hemostasis obtained.  She tolerated procedure well without any complication. Contrast: 30 cc Brandon C. Donzetta Matters, MD [facility] Office: [phone] Pager: [phone]    EKG: Orders placed or  performed during the hospital encounter of 01/11/20  . ED EKG  . ED EKG  . EKG 12-Lead  . EKG 12-Lead     Hospital Course: ANAKA BEAZER is a 75 y.o. who was admitted to [facility]. They were  brought to the operating room on 07/21/2020 and underwent Procedure(s): TOTAL KNEE ARTHROPLASTY.  Patient tolerated the procedure well and was later transferred to the recovery room and then to the orthopaedic floor for postoperative care.  They were given PO and IV analgesics for pain control following their surgery.  They were given 24 hours of postoperative antibiotics of  Anti-infectives (From admission, onward)   Start     Dose/Rate Route Frequency Ordered Stop   07/21/20 1500  ceFAZolin (ANCEF) IVPB 1 g/50 mL premix        1 g 100 mL/hr over 30 Minutes Intravenous Every 6 hours 07/21/20 1303 07/22/20 0743   07/21/20 0600  ceFAZolin (ANCEF) IVPB 2g/100 mL premix        2 g 200 mL/hr over 30 Minutes Intravenous On call to O.R. 07/21/20 [id number] 07/21/20 0924     and started on DVT prophylaxis in the form of TED hose and SCDs and Eliquis.   PT and OT were ordered for total joint protocol.  Discharge planning consulted to help with postop disposition and equipment needs.  Patient had a fair night on the evening of surgery.  They started to get up OOB with therapy on day one. Continued to work with therapy into day two.    By day four, the patient had progressed with therapy and meeting their goals.  Incision was healing well.  Patient was seen in rounds and was ready to go home.   Diet: Regular diet Activity:WBAT Follow-up:in 10-14 days Disposition - Home with HHPT Discharged Condition: good    Allergies as of 07/25/2020      Reactions   Hydrocodone Anaphylaxis, Nausea And Vomiting   Lidocaine Hcl Anaphylaxis   Piperacillin Sod-tazobactam So Itching   "scalp itch"-was given Benadryl to counteract. "scalp itch"-was given Benadryl to counteract. "scalp itch"-was given Benadryl to counteract.   Cyclosporine Other (See Comments)   burns burns burns   Hydromorphone Hcl Nausea And Vomiting   Can take with zofran   Morphine And Related Nausea And Vomiting   Percocet [oxycodone-acetaminophen]  Nausea And Vomiting      Medication List    TAKE these medications   benazepril 20 MG tablet Commonly known as: LOTENSIN Take 20 mg by mouth every morning.   Blood Sugar Balance Tabs Take 1 tablet by mouth in the morning. Glucofort (blood sugar support formula )   CALCIUM PO Take 1 tablet by mouth 2 (two) times a week.   cetirizine 10 MG tablet Commonly known as: ZyrTEC Allergy Take 1 tablet (10 mg total) by mouth daily.   docusate sodium 100 MG capsule Commonly known as: Colace Take 1 capsule (100 mg total) by mouth 2 (two) times daily as needed for mild constipation.   Eliquis 5 MG Tabs tablet Generic drug: apixaban Take 5 mg by mouth 2 (two) times daily.   famotidine 20 MG tablet Commonly known as: PEPCID Take 1 tablet (20 mg total) by mouth 2 (two) times daily.   glucose 4 GM chewable tablet Chew 16 g by mouth as needed for low blood sugar.   hydrOXYzine 25 MG tablet Commonly known as: ATARAX/VISTARIL Take 1 tablet (25 mg total) by mouth every 6 (six) hours as needed  for itching.   Lantus SoloStar 100 UNIT/ML Solostar Pen Generic drug: insulin glargine Inject 24 Units into the skin at bedtime.   Magnesium 250 MG Tabs Take 250 mg by mouth daily with lunch.   NEOMYCIN-POLYMYXIN-HYDROCORTISONE 1 % Soln OTIC solution Commonly known as: CORTISPORIN Place 3 drops into the left ear 4 (four) times daily. What changed:   when to take this  reasons to take this   niacin 500 MG tablet Take 500 mg by mouth daily with lunch.   NON FORMULARY Take 80,000 Units by mouth daily with lunch. Serrapeptase   ondansetron 4 MG tablet Commonly known as: ZOFRAN Take 1 tablet (4 mg total) by mouth every 6 (six) hours as needed for nausea.   OVER THE COUNTER MEDICATION Take 28.3 g by mouth 2 (two) times a week. Carbon-60 (2 tablespoons twice weekly)   oxyCODONE 5 MG immediate release tablet Commonly known as: Oxy IR/ROXICODONE Take 1-2 tablets (5-10 mg total) by mouth  every 4 (four) hours as needed for moderate pain or severe pain.   polyethylene glycol 17 g packet Commonly known as: MIRALAX / GLYCOLAX Take 17 g by mouth daily.   Premarin vaginal cream Generic drug: conjugated estrogens PLACE 1 APPLICATORFUL VAGINALLY 2 TIMES A WEEK. MONDAY AND WEDNESDAY OR THURSDAY. What changed: See the new instructions.   propranolol ER 80 MG 24 hr capsule Commonly known as: INDERAL LA Take 80 mg by mouth in the morning.   thiamine 100 MG tablet Commonly known as: VITAMIN B-1 Take 200 mg by mouth daily with lunch.   traMADol 50 MG tablet Commonly known as: ULTRAM Take 1-2 tablets (50-100 mg total) by mouth every 6 (six) hours as needed (mild to moderate pain).   triamcinolone cream 0.1 % Commonly known as: KENALOG Apply 1 application topically 2 (two) times daily.   vitamin B-12 1000 MCG tablet Commonly known as: CYANOCOBALAMIN Take 1,000 mcg by mouth 2 (two) times a week.   vitamin C 500 MG tablet Commonly known as: ASCORBIC ACID Take 500 mg by mouth 2 (two) times a week.   Vitamin D3 50 MCG (2000 UT) capsule Take 4,000 Units by mouth daily with lunch.       Follow-up Information    Susa Day, MD Follow up in 2 week(s).   Specialty: Orthopedic Surgery Contact information: [address] [place] [address] [phone]               Signed: Lacie Draft, PA-C Orthopaedic Surgery 07/26/2020, 12:45 PM

## 2020-07-27 DIAGNOSIS — Z9181 History of falling: Secondary | ICD-10-CM | POA: Diagnosis not present

## 2020-07-27 DIAGNOSIS — D6861 Antiphospholipid syndrome: Secondary | ICD-10-CM | POA: Diagnosis not present

## 2020-07-27 DIAGNOSIS — E119 Type 2 diabetes mellitus without complications: Secondary | ICD-10-CM | POA: Diagnosis not present

## 2020-07-27 DIAGNOSIS — K7581 Nonalcoholic steatohepatitis (NASH): Secondary | ICD-10-CM | POA: Diagnosis not present

## 2020-07-27 DIAGNOSIS — F40240 Claustrophobia: Secondary | ICD-10-CM | POA: Diagnosis not present

## 2020-07-27 DIAGNOSIS — Z471 Aftercare following joint replacement surgery: Secondary | ICD-10-CM | POA: Diagnosis not present

## 2020-07-27 DIAGNOSIS — Z8701 Personal history of pneumonia (recurrent): Secondary | ICD-10-CM | POA: Diagnosis not present

## 2020-07-27 DIAGNOSIS — K219 Gastro-esophageal reflux disease without esophagitis: Secondary | ICD-10-CM | POA: Diagnosis not present

## 2020-07-27 DIAGNOSIS — I10 Essential (primary) hypertension: Secondary | ICD-10-CM | POA: Diagnosis not present

## 2020-07-27 DIAGNOSIS — J45909 Unspecified asthma, uncomplicated: Secondary | ICD-10-CM | POA: Diagnosis not present

## 2020-07-27 DIAGNOSIS — M791 Myalgia, unspecified site: Secondary | ICD-10-CM | POA: Diagnosis not present

## 2020-07-27 DIAGNOSIS — Z7901 Long term (current) use of anticoagulants: Secondary | ICD-10-CM | POA: Diagnosis not present

## 2020-07-27 DIAGNOSIS — K766 Portal hypertension: Secondary | ICD-10-CM | POA: Diagnosis not present

## 2020-07-27 DIAGNOSIS — Z95828 Presence of other vascular implants and grafts: Secondary | ICD-10-CM | POA: Diagnosis not present

## 2020-07-27 DIAGNOSIS — Z794 Long term (current) use of insulin: Secondary | ICD-10-CM | POA: Diagnosis not present

## 2020-07-27 DIAGNOSIS — I8500 Esophageal varices without bleeding: Secondary | ICD-10-CM | POA: Diagnosis not present

## 2020-07-27 DIAGNOSIS — Z86718 Personal history of other venous thrombosis and embolism: Secondary | ICD-10-CM | POA: Diagnosis not present

## 2020-07-27 DIAGNOSIS — M797 Fibromyalgia: Secondary | ICD-10-CM | POA: Diagnosis not present

## 2020-07-27 DIAGNOSIS — Z8673 Personal history of transient ischemic attack (TIA), and cerebral infarction without residual deficits: Secondary | ICD-10-CM | POA: Diagnosis not present

## 2020-07-27 DIAGNOSIS — I209 Angina pectoris, unspecified: Secondary | ICD-10-CM | POA: Diagnosis not present

## 2020-07-27 DIAGNOSIS — I864 Gastric varices: Secondary | ICD-10-CM | POA: Diagnosis not present

## 2020-07-27 DIAGNOSIS — K900 Celiac disease: Secondary | ICD-10-CM | POA: Diagnosis not present

## 2020-07-27 DIAGNOSIS — Z96651 Presence of right artificial knee joint: Secondary | ICD-10-CM | POA: Diagnosis not present

## 2020-08-04 DIAGNOSIS — Z4789 Encounter for other orthopedic aftercare: Secondary | ICD-10-CM | POA: Diagnosis not present

## 2020-08-04 DIAGNOSIS — Z471 Aftercare following joint replacement surgery: Secondary | ICD-10-CM | POA: Diagnosis not present

## 2020-08-05 DIAGNOSIS — M797 Fibromyalgia: Secondary | ICD-10-CM | POA: Diagnosis not present

## 2020-08-05 DIAGNOSIS — I8500 Esophageal varices without bleeding: Secondary | ICD-10-CM | POA: Diagnosis not present

## 2020-08-05 DIAGNOSIS — Z794 Long term (current) use of insulin: Secondary | ICD-10-CM | POA: Diagnosis not present

## 2020-08-05 DIAGNOSIS — K219 Gastro-esophageal reflux disease without esophagitis: Secondary | ICD-10-CM | POA: Diagnosis not present

## 2020-08-05 DIAGNOSIS — Z95828 Presence of other vascular implants and grafts: Secondary | ICD-10-CM | POA: Diagnosis not present

## 2020-08-05 DIAGNOSIS — K900 Celiac disease: Secondary | ICD-10-CM | POA: Diagnosis not present

## 2020-08-05 DIAGNOSIS — K766 Portal hypertension: Secondary | ICD-10-CM | POA: Diagnosis not present

## 2020-08-05 DIAGNOSIS — Z86718 Personal history of other venous thrombosis and embolism: Secondary | ICD-10-CM | POA: Diagnosis not present

## 2020-08-05 DIAGNOSIS — Z7901 Long term (current) use of anticoagulants: Secondary | ICD-10-CM | POA: Diagnosis not present

## 2020-08-05 DIAGNOSIS — F40240 Claustrophobia: Secondary | ICD-10-CM | POA: Diagnosis not present

## 2020-08-05 DIAGNOSIS — Z8673 Personal history of transient ischemic attack (TIA), and cerebral infarction without residual deficits: Secondary | ICD-10-CM | POA: Diagnosis not present

## 2020-08-05 DIAGNOSIS — J45909 Unspecified asthma, uncomplicated: Secondary | ICD-10-CM | POA: Diagnosis not present

## 2020-08-05 DIAGNOSIS — Z8701 Personal history of pneumonia (recurrent): Secondary | ICD-10-CM | POA: Diagnosis not present

## 2020-08-05 DIAGNOSIS — Z471 Aftercare following joint replacement surgery: Secondary | ICD-10-CM | POA: Diagnosis not present

## 2020-08-05 DIAGNOSIS — D6861 Antiphospholipid syndrome: Secondary | ICD-10-CM | POA: Diagnosis not present

## 2020-08-05 DIAGNOSIS — I209 Angina pectoris, unspecified: Secondary | ICD-10-CM | POA: Diagnosis not present

## 2020-08-05 DIAGNOSIS — Z96651 Presence of right artificial knee joint: Secondary | ICD-10-CM | POA: Diagnosis not present

## 2020-08-05 DIAGNOSIS — I10 Essential (primary) hypertension: Secondary | ICD-10-CM | POA: Diagnosis not present

## 2020-08-05 DIAGNOSIS — K7581 Nonalcoholic steatohepatitis (NASH): Secondary | ICD-10-CM | POA: Diagnosis not present

## 2020-08-05 DIAGNOSIS — I864 Gastric varices: Secondary | ICD-10-CM | POA: Diagnosis not present

## 2020-08-05 DIAGNOSIS — M791 Myalgia, unspecified site: Secondary | ICD-10-CM | POA: Diagnosis not present

## 2020-08-05 DIAGNOSIS — Z9181 History of falling: Secondary | ICD-10-CM | POA: Diagnosis not present

## 2020-08-05 DIAGNOSIS — E119 Type 2 diabetes mellitus without complications: Secondary | ICD-10-CM | POA: Diagnosis not present

## 2020-08-16 DIAGNOSIS — Z86718 Personal history of other venous thrombosis and embolism: Secondary | ICD-10-CM | POA: Diagnosis not present

## 2020-08-16 DIAGNOSIS — I10 Essential (primary) hypertension: Secondary | ICD-10-CM | POA: Diagnosis not present

## 2020-08-16 DIAGNOSIS — M797 Fibromyalgia: Secondary | ICD-10-CM | POA: Diagnosis not present

## 2020-08-16 DIAGNOSIS — Z7901 Long term (current) use of anticoagulants: Secondary | ICD-10-CM | POA: Diagnosis not present

## 2020-08-16 DIAGNOSIS — F40240 Claustrophobia: Secondary | ICD-10-CM | POA: Diagnosis not present

## 2020-08-16 DIAGNOSIS — M791 Myalgia, unspecified site: Secondary | ICD-10-CM | POA: Diagnosis not present

## 2020-08-16 DIAGNOSIS — I209 Angina pectoris, unspecified: Secondary | ICD-10-CM | POA: Diagnosis not present

## 2020-08-16 DIAGNOSIS — K766 Portal hypertension: Secondary | ICD-10-CM | POA: Diagnosis not present

## 2020-08-16 DIAGNOSIS — Z471 Aftercare following joint replacement surgery: Secondary | ICD-10-CM | POA: Diagnosis not present

## 2020-08-16 DIAGNOSIS — Z9181 History of falling: Secondary | ICD-10-CM | POA: Diagnosis not present

## 2020-08-16 DIAGNOSIS — K900 Celiac disease: Secondary | ICD-10-CM | POA: Diagnosis not present

## 2020-08-16 DIAGNOSIS — Z95828 Presence of other vascular implants and grafts: Secondary | ICD-10-CM | POA: Diagnosis not present

## 2020-08-16 DIAGNOSIS — Z96651 Presence of right artificial knee joint: Secondary | ICD-10-CM | POA: Diagnosis not present

## 2020-08-16 DIAGNOSIS — I8500 Esophageal varices without bleeding: Secondary | ICD-10-CM | POA: Diagnosis not present

## 2020-08-16 DIAGNOSIS — D6861 Antiphospholipid syndrome: Secondary | ICD-10-CM | POA: Diagnosis not present

## 2020-08-16 DIAGNOSIS — Z8701 Personal history of pneumonia (recurrent): Secondary | ICD-10-CM | POA: Diagnosis not present

## 2020-08-16 DIAGNOSIS — J45909 Unspecified asthma, uncomplicated: Secondary | ICD-10-CM | POA: Diagnosis not present

## 2020-08-16 DIAGNOSIS — Z794 Long term (current) use of insulin: Secondary | ICD-10-CM | POA: Diagnosis not present

## 2020-08-16 DIAGNOSIS — K219 Gastro-esophageal reflux disease without esophagitis: Secondary | ICD-10-CM | POA: Diagnosis not present

## 2020-08-16 DIAGNOSIS — E119 Type 2 diabetes mellitus without complications: Secondary | ICD-10-CM | POA: Diagnosis not present

## 2020-08-16 DIAGNOSIS — I864 Gastric varices: Secondary | ICD-10-CM | POA: Diagnosis not present

## 2020-08-16 DIAGNOSIS — Z8673 Personal history of transient ischemic attack (TIA), and cerebral infarction without residual deficits: Secondary | ICD-10-CM | POA: Diagnosis not present

## 2020-08-16 DIAGNOSIS — K7581 Nonalcoholic steatohepatitis (NASH): Secondary | ICD-10-CM | POA: Diagnosis not present

## 2020-08-31 ENCOUNTER — Inpatient Hospital Stay: Payer: PPO | Admitting: Family

## 2020-08-31 ENCOUNTER — Inpatient Hospital Stay: Payer: PPO

## 2020-09-01 DIAGNOSIS — Z4789 Encounter for other orthopedic aftercare: Secondary | ICD-10-CM | POA: Diagnosis not present

## 2020-09-01 DIAGNOSIS — Z471 Aftercare following joint replacement surgery: Secondary | ICD-10-CM | POA: Diagnosis not present

## 2020-09-01 DIAGNOSIS — Z96659 Presence of unspecified artificial knee joint: Secondary | ICD-10-CM | POA: Diagnosis not present

## 2020-09-06 ENCOUNTER — Encounter: Payer: Self-pay | Admitting: Hematology & Oncology

## 2020-09-06 ENCOUNTER — Other Ambulatory Visit: Payer: Self-pay

## 2020-09-06 ENCOUNTER — Other Ambulatory Visit: Payer: Self-pay | Admitting: *Deleted

## 2020-09-06 ENCOUNTER — Telehealth: Payer: Self-pay

## 2020-09-06 ENCOUNTER — Inpatient Hospital Stay (HOSPITAL_BASED_OUTPATIENT_CLINIC_OR_DEPARTMENT_OTHER): Payer: PPO | Admitting: Hematology & Oncology

## 2020-09-06 ENCOUNTER — Encounter: Payer: Self-pay | Admitting: *Deleted

## 2020-09-06 ENCOUNTER — Inpatient Hospital Stay: Payer: PPO

## 2020-09-06 ENCOUNTER — Inpatient Hospital Stay: Payer: PPO | Attending: Family

## 2020-09-06 VITALS — BP 144/69 | HR 59 | Temp 98.7°F | Resp 20 | Wt 130.1 lb

## 2020-09-06 DIAGNOSIS — Z96659 Presence of unspecified artificial knee joint: Secondary | ICD-10-CM | POA: Diagnosis not present

## 2020-09-06 DIAGNOSIS — Z86718 Personal history of other venous thrombosis and embolism: Secondary | ICD-10-CM | POA: Diagnosis not present

## 2020-09-06 DIAGNOSIS — E119 Type 2 diabetes mellitus without complications: Secondary | ICD-10-CM | POA: Insufficient documentation

## 2020-09-06 DIAGNOSIS — I82401 Acute embolism and thrombosis of unspecified deep veins of right lower extremity: Secondary | ICD-10-CM

## 2020-09-06 DIAGNOSIS — K7581 Nonalcoholic steatohepatitis (NASH): Secondary | ICD-10-CM | POA: Insufficient documentation

## 2020-09-06 DIAGNOSIS — Z79899 Other long term (current) drug therapy: Secondary | ICD-10-CM | POA: Insufficient documentation

## 2020-09-06 DIAGNOSIS — Z7901 Long term (current) use of anticoagulants: Secondary | ICD-10-CM | POA: Insufficient documentation

## 2020-09-06 DIAGNOSIS — D6861 Antiphospholipid syndrome: Secondary | ICD-10-CM | POA: Diagnosis not present

## 2020-09-06 DIAGNOSIS — R350 Frequency of micturition: Secondary | ICD-10-CM

## 2020-09-06 DIAGNOSIS — Z794 Long term (current) use of insulin: Secondary | ICD-10-CM | POA: Diagnosis not present

## 2020-09-06 DIAGNOSIS — I82402 Acute embolism and thrombosis of unspecified deep veins of left lower extremity: Secondary | ICD-10-CM

## 2020-09-06 LAB — CBC WITH DIFFERENTIAL (CANCER CENTER ONLY)
Abs Immature Granulocytes: 0.03 10*3/uL (ref 0.00–0.07)
Basophils Absolute: 0.1 10*3/uL (ref 0.0–0.1)
Basophils Relative: 1 %
Eosinophils Absolute: 0.2 10*3/uL (ref 0.0–0.5)
Eosinophils Relative: 4 %
HCT: 39.9 % (ref 36.0–46.0)
Hemoglobin: 13.6 g/dL (ref 12.0–15.0)
Immature Granulocytes: 1 %
Lymphocytes Relative: 23 %
Lymphs Abs: 1.5 10*3/uL (ref 0.7–4.0)
MCH: 33 pg (ref 26.0–34.0)
MCHC: 34.1 g/dL (ref 30.0–36.0)
MCV: 96.8 fL (ref 80.0–100.0)
Monocytes Absolute: 0.7 10*3/uL (ref 0.1–1.0)
Monocytes Relative: 10 %
Neutro Abs: 4 10*3/uL (ref 1.7–7.7)
Neutrophils Relative %: 61 %
Platelet Count: 334 10*3/uL (ref 150–400)
RBC: 4.12 MIL/uL (ref 3.87–5.11)
RDW: 12.8 % (ref 11.5–15.5)
WBC Count: 6.5 10*3/uL (ref 4.0–10.5)
nRBC: 0 % (ref 0.0–0.2)

## 2020-09-06 LAB — LACTATE DEHYDROGENASE: LDH: 157 U/L (ref 98–192)

## 2020-09-06 LAB — URINALYSIS, COMPLETE (UACMP) WITH MICROSCOPIC
Bilirubin Urine: NEGATIVE
Glucose, UA: NEGATIVE mg/dL
Hgb urine dipstick: NEGATIVE
Ketones, ur: NEGATIVE mg/dL
Leukocytes,Ua: NEGATIVE
Nitrite: NEGATIVE
Protein, ur: NEGATIVE mg/dL
Specific Gravity, Urine: 1.015 (ref 1.005–1.030)
pH: 6 (ref 5.0–8.0)

## 2020-09-06 LAB — CMP (CANCER CENTER ONLY)
ALT: 10 U/L (ref 0–44)
AST: 15 U/L (ref 15–41)
Albumin: 3.9 g/dL (ref 3.5–5.0)
Alkaline Phosphatase: 81 U/L (ref 38–126)
Anion gap: 6 (ref 5–15)
BUN: 16 mg/dL (ref 8–23)
CO2: 34 mmol/L — ABNORMAL HIGH (ref 22–32)
Calcium: 10 mg/dL (ref 8.9–10.3)
Chloride: 103 mmol/L (ref 98–111)
Creatinine: 0.76 mg/dL (ref 0.44–1.00)
GFR, Estimated: >60 mL/min (ref >60)
Glucose, Bld: 142 mg/dL — ABNORMAL HIGH (ref 70–99)
Potassium: 4 mmol/L (ref 3.5–5.1)
Sodium: 143 mmol/L (ref 135–145)
Total Bilirubin: 1.2 mg/dL (ref 0.3–1.2)
Total Protein: 6.6 g/dL (ref 6.5–8.1)

## 2020-09-06 NOTE — Telephone Encounter (Signed)
appts made and printed for pt per 09/06/20 los   Laurie Green

## 2020-09-06 NOTE — Progress Notes (Signed)
Hematology and Oncology Follow Up Visit  Laurie Green [id number] August 14, 1945 75 y.o. 09/06/2020   Principle Diagnosis:  1. Antiphospholipid antibody syndrome. 2. History of recurrent lower extremity deep venous thrombosis. 3. Nonalcoholic steatohepatitis.  Current Therapy:   Coumadin - 7.5 mg po q day -  to maintain an INR between 2-3. -- d/c due to lack of availability  Eliquis 5 mg po BID -- started on 11/21/2018     Interim History:  [name] is back for followup.  Unfortunately, she is still having a lot of problems after having knee surgery on the left knee.  She had this back in early April.  She was in the hospital for 5 days.  She took physical therapy.  There is still some swelling in the left knee.  She saw the orthopedic doctor a week ago.  I am not sure how we can help with this.  Otherwise, she did have a IVC filter placed.  This was done by vascular surgery.  This is a removable filter.  I told Laurie Green that I would have the filter removed at the right time.  The vascular surgeon will dictate this.  She is on Eliquis.  She is tolerating Eliquis okay.  There is no bleeding.  She just is having a hard time getting around.  She does not have any fever.  There is no cough or shortness of breath.  There is no chest wall pain.  There is no leg swelling.  Overall, I would say performance status is by ECOG 2.  She is not eating all that well.  She does not have much of an appetite.    I think she is constipated also.  I am sure this is from the pain medications.    Medications:  Current Outpatient Medications:  .  apixaban (ELIQUIS) 5 MG TABS tablet, Take 5 mg by mouth 2 (two) times daily., Disp: , Rfl:  .  benazepril (LOTENSIN) 20 MG tablet, Take 20 mg by mouth every morning., Disp: , Rfl:  .  docusate sodium (COLACE) 100 MG capsule, Take 1 capsule (100 mg total) by mouth 2 (two) times daily as needed for mild constipation., Disp: 30 capsule, Rfl: 1 .  LANTUS  SOLOSTAR 100 UNIT/ML Solostar Pen, Inject 24 Units into the skin at bedtime., Disp: , Rfl:  .  Misc Natural Products (BLOOD SUGAR BALANCE) TABS, Take 1 tablet by mouth in the morning. Glucofort (blood sugar support formula ), Disp: , Rfl:  .  NON FORMULARY, Take 80,000 Units by mouth daily with lunch. Serrapeptase, Disp: , Rfl:  .  ondansetron (ZOFRAN) 4 MG tablet, Take 1 tablet (4 mg total) by mouth every 6 (six) hours as needed for nausea., Disp: 40 tablet, Rfl: 0 .  oxyCODONE (OXY IR/ROXICODONE) 5 MG immediate release tablet, Take 1-2 tablets (5-10 mg total) by mouth every 4 (four) hours as needed for moderate pain or severe pain., Disp: 30 tablet, Rfl: 0 .  polyethylene glycol (MIRALAX / GLYCOLAX) 17 g packet, Take 17 g by mouth daily., Disp: 14 each, Rfl: 0 .  PREMARIN vaginal cream, PLACE 1 APPLICATORFUL VAGINALLY 2 TIMES A WEEK. MONDAY AND WEDNESDAY OR THURSDAY. (Patient taking differently: Place 1 Applicatorful vaginally 2 (two) times a week. Mondays & Thursdays), Disp: 30 g, Rfl: 3 .  propranolol ER (INDERAL LA) 80 MG 24 hr capsule, Take 80 mg by mouth in the morning., Disp: , Rfl:  .  traMADol (ULTRAM) 50 MG tablet, Take  1-2 tablets (50-100 mg total) by mouth every 6 (six) hours as needed (mild to moderate pain)., Disp: 40 tablet, Rfl: 0 .  CALCIUM PO, Take 1 tablet by mouth 2 (two) times a week. (Patient not taking: Reported on 09/06/2020), Disp: , Rfl:  .  cetirizine (ZYRTEC ALLERGY) 10 MG tablet, Take 1 tablet (10 mg total) by mouth daily. (Patient not taking: Reported on 07/06/2020), Disp: 30 tablet, Rfl: 0 .  Cholecalciferol (VITAMIN D3) 2000 UNITS capsule, Take 4,000 Units by mouth daily with lunch. (Patient not taking: Reported on 09/06/2020), Disp: , Rfl:  .  famotidine (PEPCID) 20 MG tablet, Take 1 tablet (20 mg total) by mouth 2 (two) times daily. (Patient not taking: No sig reported), Disp: 30 tablet, Rfl: 0 .  glucose 4 GM chewable tablet, Chew 16 g by mouth as needed for low blood  sugar. (Patient not taking: Reported on 09/06/2020), Disp: , Rfl:  .  hydrOXYzine (ATARAX/VISTARIL) 25 MG tablet, Take 1 tablet (25 mg total) by mouth every 6 (six) hours as needed for itching. (Patient not taking: No sig reported), Disp: 12 tablet, Rfl: 0 .  Magnesium 250 MG TABS, Take 250 mg by mouth daily with lunch. (Patient not taking: Reported on 09/06/2020), Disp: , Rfl:  .  NEOMYCIN-POLYMYXIN-HYDROCORTISONE (CORTISPORIN) 1 % SOLN OTIC solution, Place 3 drops into the left ear 4 (four) times daily. (Patient not taking: Reported on 09/06/2020), Disp: 10 mL, Rfl: 2 .  niacin 500 MG tablet, Take 500 mg by mouth daily with lunch. (Patient not taking: Reported on 09/06/2020), Disp: , Rfl:  .  OVER THE COUNTER MEDICATION, Take 28.3 g by mouth 2 (two) times a week. Carbon-60 (2 tablespoons twice weekly) (Patient not taking: Reported on 09/06/2020), Disp: , Rfl:  .  thiamine (VITAMIN B-1) 100 MG tablet, Take 200 mg by mouth daily with lunch. (Patient not taking: Reported on 09/06/2020), Disp: , Rfl:  .  vitamin B-12 (CYANOCOBALAMIN) 1000 MCG tablet, Take 1,000 mcg by mouth 2 (two) times a week. (Patient not taking: Reported on 09/06/2020), Disp: , Rfl:  .  vitamin C (ASCORBIC ACID) 500 MG tablet, Take 500 mg by mouth 2 (two) times a week. (Patient not taking: Reported on 09/06/2020), Disp: , Rfl:   Allergies:  Allergies  Allergen Reactions  . Hydrocodone Anaphylaxis and Nausea And Vomiting  . Lidocaine Hcl Anaphylaxis  . Piperacillin Sod-Tazobactam So Itching    "scalp itch"-was given Benadryl to counteract. "scalp itch"-was given Benadryl to counteract. "scalp itch"-was given Benadryl to counteract.  . Cyclosporine Other (See Comments)    burns burns burns  . Hydromorphone Hcl Nausea And Vomiting    Can take with zofran  . Morphine And Related Nausea And Vomiting  . Percocet [Oxycodone-Acetaminophen] Nausea And Vomiting    Past Medical History, Surgical history, Social history, and Family  History were reviewed and updated.  Review of Systems: Review of Systems  Constitutional: Positive for malaise/fatigue.  HENT: Negative.   Eyes: Negative.   Respiratory: Negative.   Cardiovascular: Negative.   Gastrointestinal: Positive for abdominal pain and diarrhea.  Genitourinary: Negative.   Musculoskeletal: Positive for myalgias.  Skin: Negative.   Neurological: Negative.   Endo/Heme/Allergies: Negative.   Psychiatric/Behavioral: Negative.      Physical Exam:  weight is 130 lb 1.9 oz (59 kg). Her oral temperature is 98.7 F (37.1 C). Her blood pressure is 144/69 (abnormal) and her pulse is 59 (abnormal). Her respiration is 20 and oxygen saturation is 98%.   Physical  Exam Vitals reviewed.  HENT:     Head: Normocephalic and atraumatic.  Eyes:     Pupils: Pupils are equal, round, and reactive to light.  Cardiovascular:     Rate and Rhythm: Normal rate and regular rhythm.     Heart sounds: Normal heart sounds.  Pulmonary:     Effort: Pulmonary effort is normal.     Breath sounds: Normal breath sounds.  Abdominal:     General: Bowel sounds are normal.     Palpations: Abdomen is soft.    Musculoskeletal:        General: No tenderness or deformity.     Cervical back: Normal range of motion.     Comments: Examination of her left knee shows some swelling and heat.  I do not see any erythema.  She has decreased range of motion.  She has good pulses in her distal extremities.  There is no swelling or venous cord in the calf muscles bilaterally.  Lymphadenopathy:     Cervical: No cervical adenopathy.  Skin:    General: Skin is warm and dry.     Findings: No erythema or rash.  Neurological:     Mental Status: She is alert and oriented to person, place, and time.  Psychiatric:        Behavior: Behavior normal.        Thought Content: Thought content normal.        Judgment: Judgment normal.     Lab Results  Component Value Date   WBC 6.5 09/06/2020   HGB 13.6  09/06/2020   HCT 39.9 09/06/2020   MCV 96.8 09/06/2020   PLT 334 09/06/2020     Chemistry      Component Value Date/Time   NA 143 09/06/2020 0817   NA 144 04/04/2017 1139   NA 140 09/07/2016 0946   K 4.0 09/06/2020 0817   K 3.5 04/04/2017 1139   K 4.1 09/07/2016 0946   CL 103 09/06/2020 0817   CL 103 04/04/2017 1139   CO2 34 (H) 09/06/2020 0817   CO2 33 04/04/2017 1139   CO2 28 09/07/2016 0946   BUN 16 09/06/2020 0817   BUN 15 04/04/2017 1139   BUN 22.6 09/07/2016 0946   CREATININE 0.76 09/06/2020 0817   CREATININE 0.9 04/04/2017 1139   CREATININE 0.8 09/07/2016 0946      Component Value Date/Time   CALCIUM 10.0 09/06/2020 0817   CALCIUM 9.5 04/04/2017 1139   CALCIUM 9.6 09/07/2016 0946   ALKPHOS 81 09/06/2020 0817   ALKPHOS 93 (H) 04/04/2017 1139   ALKPHOS 81 09/07/2016 0946   AST 15 09/06/2020 0817   AST 32 09/07/2016 0946   ALT 10 09/06/2020 0817   ALT 36 04/04/2017 1139   ALT 33 09/07/2016 0946   BILITOT 1.2 09/06/2020 0817   BILITOT 1.34 (H) 09/07/2016 0946       Impression and Plan: Laurie Green is 75 year old white female with the anti-phospholipid antibody syndrome. This really has not been a problem for her. The bigger issue has been the hepatic problems and her diabetes. She has steatosis. She has NASH.  Currently, her problem definitely is the knee.  Hopefully, she will not need anything else done for the left knee.  There is still some swelling.  I will plan to see her back in 3 months.  Hopefully by then, she will be doing a lot better.   [name], MD 5/23/20229:16 AM

## 2020-09-08 DIAGNOSIS — Z0000 Encounter for general adult medical examination without abnormal findings: Secondary | ICD-10-CM | POA: Diagnosis not present

## 2020-09-08 DIAGNOSIS — I10 Essential (primary) hypertension: Secondary | ICD-10-CM | POA: Diagnosis not present

## 2020-09-08 DIAGNOSIS — Z794 Long term (current) use of insulin: Secondary | ICD-10-CM | POA: Diagnosis not present

## 2020-09-08 DIAGNOSIS — D6861 Antiphospholipid syndrome: Secondary | ICD-10-CM | POA: Diagnosis not present

## 2020-09-08 DIAGNOSIS — E119 Type 2 diabetes mellitus without complications: Secondary | ICD-10-CM | POA: Diagnosis not present

## 2020-09-08 DIAGNOSIS — K7469 Other cirrhosis of liver: Secondary | ICD-10-CM | POA: Diagnosis not present

## 2020-09-08 DIAGNOSIS — E782 Mixed hyperlipidemia: Secondary | ICD-10-CM | POA: Diagnosis not present

## 2020-09-16 ENCOUNTER — Emergency Department (HOSPITAL_BASED_OUTPATIENT_CLINIC_OR_DEPARTMENT_OTHER): Payer: PPO

## 2020-09-16 ENCOUNTER — Other Ambulatory Visit: Payer: Self-pay

## 2020-09-16 ENCOUNTER — Emergency Department (HOSPITAL_BASED_OUTPATIENT_CLINIC_OR_DEPARTMENT_OTHER)
Admission: EM | Admit: 2020-09-16 | Discharge: 2020-09-16 | Disposition: A | Discharge status: Home / Self Care | Payer: PPO | Attending: Emergency Medicine | Admitting: Emergency Medicine

## 2020-09-16 ENCOUNTER — Encounter (HOSPITAL_BASED_OUTPATIENT_CLINIC_OR_DEPARTMENT_OTHER): Payer: Self-pay | Admitting: *Deleted

## 2020-09-16 DIAGNOSIS — H9202 Otalgia, left ear: Secondary | ICD-10-CM | POA: Insufficient documentation

## 2020-09-16 DIAGNOSIS — Z794 Long term (current) use of insulin: Secondary | ICD-10-CM | POA: Diagnosis not present

## 2020-09-16 DIAGNOSIS — I10 Essential (primary) hypertension: Secondary | ICD-10-CM | POA: Diagnosis not present

## 2020-09-16 DIAGNOSIS — Z7901 Long term (current) use of anticoagulants: Secondary | ICD-10-CM | POA: Diagnosis not present

## 2020-09-16 DIAGNOSIS — J45909 Unspecified asthma, uncomplicated: Secondary | ICD-10-CM | POA: Insufficient documentation

## 2020-09-16 DIAGNOSIS — R112 Nausea with vomiting, unspecified: Secondary | ICD-10-CM | POA: Diagnosis not present

## 2020-09-16 DIAGNOSIS — Z96651 Presence of right artificial knee joint: Secondary | ICD-10-CM | POA: Insufficient documentation

## 2020-09-16 DIAGNOSIS — E119 Type 2 diabetes mellitus without complications: Secondary | ICD-10-CM | POA: Insufficient documentation

## 2020-09-16 DIAGNOSIS — R110 Nausea: Secondary | ICD-10-CM | POA: Insufficient documentation

## 2020-09-16 DIAGNOSIS — R42 Dizziness and giddiness: Secondary | ICD-10-CM | POA: Diagnosis not present

## 2020-09-16 LAB — CBC
HCT: 41.4 % (ref 36.0–46.0)
Hemoglobin: 14.1 g/dL (ref 12.0–15.0)
MCH: 33.1 pg (ref 26.0–34.0)
MCHC: 34.1 g/dL (ref 30.0–36.0)
MCV: 97.2 fL (ref 80.0–100.0)
Platelets: 321 10*3/uL (ref 150–400)
RBC: 4.26 MIL/uL (ref 3.87–5.11)
RDW: 13.2 % (ref 11.5–15.5)
WBC: 7.4 10*3/uL (ref 4.0–10.5)
nRBC: 0 % (ref 0.0–0.2)

## 2020-09-16 LAB — BASIC METABOLIC PANEL
Anion gap: 8 (ref 5–15)
BUN: 19 mg/dL (ref 8–23)
CO2: 30 mmol/L (ref 22–32)
Calcium: 9.7 mg/dL (ref 8.9–10.3)
Chloride: 103 mmol/L (ref 98–111)
Creatinine, Ser: 0.72 mg/dL (ref 0.44–1.00)
GFR, Estimated: >60 mL/min (ref >60)
Glucose, Bld: 166 mg/dL — ABNORMAL HIGH (ref 70–99)
Potassium: 3.9 mmol/L (ref 3.5–5.1)
Sodium: 141 mmol/L (ref 135–145)

## 2020-09-16 MED ORDER — ONDANSETRON 8 MG PO TBDP: 8.0000 mg | ORAL_TABLET | Freq: Three times a day (TID) | ORAL | 0 refills | Status: DC | PRN

## 2020-09-16 MED ORDER — MECLIZINE HCL 25 MG PO TABS
12.5000 mg | ORAL_TABLET | Freq: Once | ORAL | Status: AC
  Administered 2020-09-16: 12.5 mg via ORAL
  Filled 2020-09-16: qty 1

## 2020-09-16 MED ORDER — MECLIZINE HCL 12.5 MG PO TABS: 12.5000 mg | ORAL_TABLET | Freq: Three times a day (TID) | ORAL | 0 refills | Status: DC | PRN

## 2020-09-16 NOTE — Discharge Instructions (Addendum)
Take the medications as needed for dizziness and nausea.  Follow up with your doctor next week to be rechecked if the symptoms have not resolved

## 2020-09-16 NOTE — ED Provider Notes (Signed)
[facility] EMERGENCY DEPT Provider Note   CSN: [id number] Arrival date & time: 09/16/20  1711     History Chief Complaint  Patient presents with  . Otalgia  . Dizziness  . Nausea    Laurie Green is a 75 y.o. female.  HPI   Patient states she has been having intermittent episodes of dizziness since April after her knee surgery.  Patient also has had some nausea with this.  She had some episodes of vertigo with dizziness and unsteadiness back in April but the symptoms have not been constant or persistent.  The last couple days she had another episode again although it is currently resolved.  She felt like she was falling to 1 side and was moving on her.  Patient also started having some discomfort in her left ear.  She is not having any fevers or chills.  No weakness.  No trouble with her speech.  Past Medical History:  Diagnosis Date  . Anginal pain ([place])   . 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 Antiphospholipid antibody with hypercoagulable state ([place]) 11/07/2012  . Arthritis   . Asthma    well controlled, no rescue inhaler used  . Blood dyscrasia    anticardiolipid syndrome  . Blood transfusion    age 70 yr old-none since  . Celiac disease   . Diabetes mellitus    Type II  . Diverticulitis   . DVT (deep venous thrombosis) ([place]) 05/01/2011   "to liver"  . Family history of adverse reaction to anesthesia    N&V  . Fibromyalgia   . GERD (gastroesophageal reflux disease)    11/01/16- not problem  . H/O seasonal allergies   . Heart murmur    "not a problem"  . Hypertension   . NASH (nonalcoholic steatohepatitis) 05/01/2011  . Nausea & vomiting    11-17-14 "nausea and vomiting after meals" at present discussed with Dr. Oletta Lamas office per pt.  . Pneumonia 2021  . PONV (postoperative nausea and vomiting)    'I dont have it if they give me something in the IV."  . Portal hypertension ([place])   . Stroke ([place])     tia- eye,  . TIA (transient ischemic attack)   . Varices 05/01/2011  . Varices, esophageal ([place])   . Varices, gastric     Patient Active Problem List   Diagnosis Date Noted  . 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 Antiphospholipid antibody with hypercoagulable state ([place]) 11/07/2012  . Transaminitis 09/07/2012  . Fever, unspecified 09/07/2012  . Abdominal pain 09/04/2012  . Subtherapeutic international normalized ratio (INR) 09/04/2012  . Diabetes mellitus ([place]) 09/04/2012  . Leukocytosis, unspecified 09/04/2012  . Varices, esophageal ([place]) 09/04/2012  . Transaminasemia 09/04/2012  . DVT (deep venous thrombosis) ([place]) 05/01/2011  . Varices 05/01/2011  . NASH (nonalcoholic steatohepatitis) 05/01/2011  . Incisional hernia without mention of obstruction or gangrene 02/14/2011    Past Surgical History:  Procedure Laterality Date  . ABDOMINAL HYSTERECTOMY    . ANKLE FRACTURE SURGERY Left    retained hardware  . CHOLECYSTECTOMY    . COLON SURGERY     hx. diverticulitis  . COLONOSCOPY N/A 11/23/2014   Procedure: COLONOSCOPY;  Surgeon: [name], MD;  Location: [facility] ENDOSCOPY;  Service: Endoscopy;  Laterality: N/A;  . ESOPHAGOGASTRODUODENOSCOPY  04/28/2011   Procedure: ESOPHAGOGASTRODUODENOSCOPY (EGD);  Surgeon: [name]., MD;  Location[facility] ENDOSCOPY;  Service: Endoscopy;  Laterality: N/A;  . ESOPHAGOGASTRODUODENOSCOPY N/A 11/23/2014  Procedure: ESOPHAGOGASTRODUODENOSCOPY (EGD);  Surgeon: [name], MD;  Location: [facility] ENDOSCOPY;  Service: Endoscopy;  Laterality: N/A;  . ESOPHAGOGASTRODUODENOSCOPY (EGD) WITH PROPOFOL N/A 11/06/2016   Procedure: ESOPHAGOGASTRODUODENOSCOPY (EGD) WITH PROPOFOL;  Surgeon: [name], MD;  Location: [facility];  Service: Endoscopy;  Laterality: N/A;  . EUS N/A 09/25/2012   Procedure: ESOPHAGEAL ENDOSCOPIC ULTRASOUND (EUS) RADIAL;  Surgeon: Arta Silence, MD;  Location: [facility] ENDOSCOPY;  Service: Endoscopy;  Laterality: N/A;  . HERNIA REPAIR     luq  incisional  . IVC FILTER INSERTION N/A 07/19/2020   Procedure: IVC FILTER INSERTION;  Surgeon: Waynetta Sandy, MD;  Location: [facility];  Service: Cardiovascular;  Laterality: N/A;  . SPLENECTOMY, TOTAL    . TONSILLECTOMY    . TOTAL KNEE ARTHROPLASTY Right 07/21/2020   Procedure: TOTAL KNEE ARTHROPLASTY;  Surgeon: Susa Day, MD;  Location: [facility] ORS;  Service: Orthopedics;  Laterality: Right;  2.5 hrs     OB History   No obstetric history on file.     No family history on file.  Social History   Tobacco Use  . Smoking status: Never Smoker  . Smokeless tobacco: Never Used  . Tobacco comment: never used tobacco  Vaping Use  . Vaping Use: Never used  Substance Use Topics  . Alcohol use: No    Alcohol/week: 0.0 standard drinks  . Drug use: No    Home Medications Prior to Admission medications   Medication Sig Start Date End Date Taking? Authorizing Provider  meclizine (ANTIVERT) 12.5 MG tablet Take 1 tablet (12.5 mg total) by mouth 3 (three) times daily as needed for dizziness. 09/16/20  Yes Dorie Rank, MD  ondansetron (ZOFRAN ODT) 8 MG disintegrating tablet Take 1 tablet (8 mg total) by mouth every 8 (eight) hours as needed for nausea or vomiting. 09/16/20  Yes Dorie Rank, MD  apixaban (ELIQUIS) 5 MG TABS tablet Take 5 mg by mouth 2 (two) times daily.    [provider]  benazepril (LOTENSIN) 20 MG tablet Take 20 mg by mouth every morning.    [provider]  CALCIUM PO Take 1 tablet by mouth 2 (two) times a week. Patient not taking: Reported on 09/06/2020    [provider]  cetirizine (ZYRTEC ALLERGY) 10 MG tablet Take 1 tablet (10 mg total) by mouth daily. Patient not taking: Reported on 07/06/2020 05/02/20 06/01/20  Tacy Learn, PA-C  Cholecalciferol (VITAMIN D3) 2000 UNITS capsule Take 4,000 Units by mouth daily with lunch. Patient not taking: Reported on 09/06/2020    [provider]  docusate sodium (COLACE) 100 MG  capsule Take 1 capsule (100 mg total) by mouth 2 (two) times daily as needed for mild constipation. 07/21/20   Susa Day, MD  famotidine (PEPCID) 20 MG tablet Take 1 tablet (20 mg total) by mouth 2 (two) times daily. Patient not taking: No sig reported 05/02/20   Suella Broad A, PA-C  glucose 4 GM chewable tablet Chew 16 g by mouth as needed for low blood sugar. Patient not taking: Reported on 09/06/2020    [provider]  hydrOXYzine (ATARAX/VISTARIL) 25 MG tablet Take 1 tablet (25 mg total) by mouth every 6 (six) hours as needed for itching. Patient not taking: No sig reported 05/02/20   Tacy Learn, PA-C  LANTUS SOLOSTAR 100 UNIT/ML Solostar Pen Inject 24 Units into the skin at bedtime. 04/21/19   [provider]  Magnesium 250 MG TABS Take 250 mg by mouth daily with lunch.  Patient not taking: Reported on 09/06/2020    [provider]  Misc Natural Products (BLOOD SUGAR BALANCE) TABS Take 1 tablet by mouth in the morning. Glucofort (blood sugar support formula )    [provider]  NEOMYCIN-POLYMYXIN-HYDROCORTISONE (CORTISPORIN) 1 % SOLN OTIC solution Place 3 drops into the left ear 4 (four) times daily. Patient not taking: Reported on 09/06/2020 08/15/17   [name], MD  niacin 500 MG tablet Take 500 mg by mouth daily with lunch. Patient not taking: Reported on 09/06/2020    [provider]  NON FORMULARY Take 80,000 Units by mouth daily with lunch. Serrapeptase    [provider]  ondansetron (ZOFRAN) 4 MG tablet Take 1 tablet (4 mg total) by mouth every 6 (six) hours as needed for nausea. 07/23/20   Cecilie Kicks, PA-C  OVER THE COUNTER MEDICATION Take 28.3 g by mouth 2 (two) times a week. Carbon-60 (2 tablespoons twice weekly) Patient not taking: Reported on 09/06/2020    [provider]  oxyCODONE (OXY IR/ROXICODONE) 5 MG immediate release tablet Take 1-2 tablets (5-10 mg total) by mouth every 4 (four) hours as needed  for moderate pain or severe pain. 07/23/20   Cecilie Kicks, PA-C  polyethylene glycol (MIRALAX / GLYCOLAX) 17 g packet Take 17 g by mouth daily. 07/21/20   Susa Day, MD  PREMARIN vaginal cream PLACE 1 APPLICATORFUL VAGINALLY 2 TIMES A WEEK. MONDAY AND WEDNESDAY OR THURSDAY. Patient taking differently: Place 1 Applicatorful vaginally 2 (two) times a week. Mondays & Thursdays 06/07/20   [name], MD  propranolol ER (INDERAL LA) 80 MG 24 hr capsule Take 80 mg by mouth in the morning. 05/31/20   [provider]  thiamine (VITAMIN B-1) 100 MG tablet Take 200 mg by mouth daily with lunch. Patient not taking: Reported on 09/06/2020    [provider]  traMADol (ULTRAM) 50 MG tablet Take 1-2 tablets (50-100 mg total) by mouth every 6 (six) hours as needed (mild to moderate pain). 07/23/20   Cecilie Kicks, PA-C  vitamin B-12 (CYANOCOBALAMIN) 1000 MCG tablet Take 1,000 mcg by mouth 2 (two) times a week. Patient not taking: Reported on 09/06/2020    [provider]  vitamin C (ASCORBIC ACID) 500 MG tablet Take 500 mg by mouth 2 (two) times a week. Patient not taking: Reported on 09/06/2020    [provider]    Allergies    Hydrocodone, Lidocaine hcl, Piperacillin sod-tazobactam so, Cyclosporine, Hydromorphone hcl, Morphine and related, and Percocet [oxycodone-acetaminophen]  Review of Systems   Review of Systems  All other systems reviewed and are negative.   Physical Exam Updated Vital Signs BP (!) 155/82 (BP Location: Left Arm)   Pulse 78   Temp 98.4 F (36.9 C) (Oral)   Resp 12   Ht 1.613 m (5' 3.5")   Wt 56.7 kg   SpO2 98%   BMI 21.80 kg/m   Physical Exam Vitals and nursing note reviewed.  Constitutional:      General: She is not in acute distress.    Appearance: She is well-developed.  HENT:     Head: Normocephalic and atraumatic.     Right Ear: Tympanic membrane and external ear normal.     Left Ear: Tympanic membrane and external  ear normal.  Eyes:     General: No scleral icterus.       Right eye: No discharge.        Left eye: No discharge.  Conjunctiva/sclera: Conjunctivae normal.  Neck:     Trachea: No tracheal deviation.  Cardiovascular:     Rate and Rhythm: Normal rate and regular rhythm.  Pulmonary:     Effort: Pulmonary effort is normal. No respiratory distress.     Breath sounds: Normal breath sounds. No stridor. No wheezing or rales.  Abdominal:     General: Bowel sounds are normal. There is no distension.     Palpations: Abdomen is soft.     Tenderness: There is no abdominal tenderness. There is no guarding or rebound.  Musculoskeletal:        General: No tenderness.     Cervical back: Neck supple.  Skin:    General: Skin is warm and dry.     Findings: No rash.  Neurological:     Mental Status: She is alert and oriented to person, place, and time.     Cranial Nerves: No cranial nerve deficit (No facial droop, extraocular movements intact, tongue midline ).     Sensory: No sensory deficit.     Motor: No abnormal muscle tone or seizure activity.     Coordination: Coordination normal.     Comments: No pronator drift bilateral upper extrem, able to hold both legs off bed for 5 seconds, sensation intact in all extremities, no visual field cuts, no left or right sided neglect, normal finger-nose exam bilaterally, no nystagmus noted      ED Results / Procedures / Treatments   Labs (all labs ordered are listed, but only abnormal results are displayed) Labs Reviewed  BASIC METABOLIC PANEL - Abnormal; Notable for the following components:      Result Value   Glucose, Bld 166 (*)    All other components within normal limits  CBC    EKG EKG Interpretation  Date/Time:  Thursday September 16 2020 18:06:13 EDT Ventricular Rate:  57 PR Interval:  173 QRS Duration: 95 QT Interval:  449 QTC Calculation: 438 R Axis:   35 Text Interpretation: Sinus rhythm Low voltage, precordial leads No significant  change since last tracing Confirmed by Dorie Rank [phone]) on 09/16/2020 6:48:56 PM   Radiology CT Head Wo Contrast  Result Date: 09/16/2020 CLINICAL DATA:  Left ear pain, nausea EXAM: CT HEAD WITHOUT CONTRAST TECHNIQUE: Contiguous axial images were obtained from the base of the skull through the vertex without intravenous contrast. COMPARISON:  09/14/2018 FINDINGS: Brain: No acute intracranial abnormality. Specifically, no hemorrhage, hydrocephalus, mass lesion, acute infarction, or significant intracranial injury. Vascular: No hyperdense vessel or unexpected calcification. Skull: No acute calvarial abnormality. Sinuses/Orbits: Visualized paranasal sinuses and mastoids clear. Orbital soft tissues unremarkable. Other: None IMPRESSION: No acute intracranial abnormality. Electronically Signed   By: Rolm Baptise M.D.   On: 09/16/2020 17:59    Procedures Procedures   Medications Ordered in ED Medications  meclizine (ANTIVERT) tablet 12.5 mg (12.5 mg Oral Given 09/16/20 1759)    ED Course  I have reviewed the triage vital signs and the nursing notes.  Pertinent labs & imaging results that were available during my care of the patient were reviewed by me and considered in my medical decision making (see chart for details).  Clinical Course as of 09/16/20 1859  Thu Sep 17, 8142  8185 Metabolic panel normal [JK]  1835 cT scan without acute findings [JK]    Clinical Course User Index [JK] Dorie Rank, MD   MDM Rules/Calculators/A&P  Patient presented to the ED with recurrent dizziness.  Patient was also having ear pain.  She does not have any evidence of otitis externa or media on exam.  Her neurologic exam is reassuring.  She has no focal deficits.  I doubt central cause for her vertigo CT scan does not show any evidence of sinusitis.  Laboratory tests are otherwise unremarkable.  Discharge home with symptomatic care.  Outpatient follow-up with PCP. Final Clinical  Impression(s) / ED Diagnoses Final diagnoses:  Vertigo    Rx / DC Orders ED Discharge Orders         Ordered    meclizine (ANTIVERT) 12.5 MG tablet  3 times daily PRN        09/16/20 1857    ondansetron (ZOFRAN ODT) 8 MG disintegrating tablet  Every 8 hours PRN        09/16/20 1857           Dorie Rank, MD 09/16/20 1902

## 2020-09-16 NOTE — ED Triage Notes (Signed)
Pt had knee surgery in April, since decreased appetite and some nausea. Last 2 days vertigo, Left ear pain and nausea have increased.

## 2020-09-26 ENCOUNTER — Emergency Department (HOSPITAL_BASED_OUTPATIENT_CLINIC_OR_DEPARTMENT_OTHER): Payer: PPO | Admitting: Radiology

## 2020-09-26 ENCOUNTER — Encounter (HOSPITAL_BASED_OUTPATIENT_CLINIC_OR_DEPARTMENT_OTHER): Payer: Self-pay | Admitting: *Deleted

## 2020-09-26 ENCOUNTER — Emergency Department (HOSPITAL_BASED_OUTPATIENT_CLINIC_OR_DEPARTMENT_OTHER)
Admission: EM | Admit: 2020-09-26 | Discharge: 2020-09-26 | Disposition: A | Discharge status: Home / Self Care | Payer: PPO | Attending: Emergency Medicine | Admitting: Emergency Medicine

## 2020-09-26 ENCOUNTER — Other Ambulatory Visit: Payer: Self-pay

## 2020-09-26 DIAGNOSIS — Z79899 Other long term (current) drug therapy: Secondary | ICD-10-CM | POA: Insufficient documentation

## 2020-09-26 DIAGNOSIS — J45909 Unspecified asthma, uncomplicated: Secondary | ICD-10-CM | POA: Insufficient documentation

## 2020-09-26 DIAGNOSIS — Z7901 Long term (current) use of anticoagulants: Secondary | ICD-10-CM | POA: Diagnosis not present

## 2020-09-26 DIAGNOSIS — Z86718 Personal history of other venous thrombosis and embolism: Secondary | ICD-10-CM | POA: Diagnosis not present

## 2020-09-26 DIAGNOSIS — R0602 Shortness of breath: Secondary | ICD-10-CM | POA: Diagnosis not present

## 2020-09-26 DIAGNOSIS — Z794 Long term (current) use of insulin: Secondary | ICD-10-CM | POA: Diagnosis not present

## 2020-09-26 DIAGNOSIS — R002 Palpitations: Secondary | ICD-10-CM

## 2020-09-26 DIAGNOSIS — R5383 Other fatigue: Secondary | ICD-10-CM | POA: Insufficient documentation

## 2020-09-26 DIAGNOSIS — Z96651 Presence of right artificial knee joint: Secondary | ICD-10-CM | POA: Insufficient documentation

## 2020-09-26 DIAGNOSIS — M25561 Pain in right knee: Secondary | ICD-10-CM | POA: Diagnosis not present

## 2020-09-26 DIAGNOSIS — E119 Type 2 diabetes mellitus without complications: Secondary | ICD-10-CM | POA: Insufficient documentation

## 2020-09-26 DIAGNOSIS — I10 Essential (primary) hypertension: Secondary | ICD-10-CM | POA: Diagnosis not present

## 2020-09-26 DIAGNOSIS — R0789 Other chest pain: Secondary | ICD-10-CM | POA: Diagnosis not present

## 2020-09-26 LAB — BASIC METABOLIC PANEL
Anion gap: 10 (ref 5–15)
BUN: 17 mg/dL (ref 8–23)
CO2: 29 mmol/L (ref 22–32)
Calcium: 9.3 mg/dL (ref 8.9–10.3)
Chloride: 102 mmol/L (ref 98–111)
Creatinine, Ser: 0.64 mg/dL (ref 0.44–1.00)
GFR, Estimated: >60 mL/min (ref >60)
Glucose, Bld: 230 mg/dL — ABNORMAL HIGH (ref 70–99)
Potassium: 3.9 mmol/L (ref 3.5–5.1)
Sodium: 141 mmol/L (ref 135–145)

## 2020-09-26 LAB — CBC
HCT: 40.2 % (ref 36.0–46.0)
Hemoglobin: 13.7 g/dL (ref 12.0–15.0)
MCH: 32.4 pg (ref 26.0–34.0)
MCHC: 34.1 g/dL (ref 30.0–36.0)
MCV: 95 fL (ref 80.0–100.0)
Platelets: 327 10*3/uL (ref 150–400)
RBC: 4.23 MIL/uL (ref 3.87–5.11)
RDW: 12.6 % (ref 11.5–15.5)
WBC: 6.7 10*3/uL (ref 4.0–10.5)
nRBC: 0 % (ref 0.0–0.2)

## 2020-09-26 LAB — TROPONIN I (HIGH SENSITIVITY): Troponin I (High Sensitivity): 8 ng/L (ref <18)

## 2020-09-26 NOTE — ED Notes (Signed)
Patient transported to CT 

## 2020-09-26 NOTE — Discharge Instructions (Addendum)
You are seen in the emergency department for an episode of palpitations.  Your EKG lab work and chest x-ray here did not show any serious findings.  Please contact your primary care doctor to have them set you up for a outpatient monitor.  If your symptoms recur and do not resolve within 10 minutes please call 911 or return to the emergency department.

## 2020-09-26 NOTE — ED Triage Notes (Signed)
Pt had a sudden onset of palpations this morning while in bathroom changing clothes.

## 2020-09-26 NOTE — ED Provider Notes (Signed)
[facility] EMERGENCY DEPT Provider Note   CSN: [id number] Arrival date & time: 09/26/20  1249     History Chief Complaint  Patient presents with   palpations    Laurie Green is a 75 y.o. female.  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  She had a recent knee surgery 2 months ago and it still troubles her.  She was at home brushing her hair when she acutely felt her heart racing.  It caused her to feel short of breath and weak and her chest felt tight.  She used her blood pressure machine and she said it was beating very fast but would not give her heart rate.  She laid on the couch and practiced some deep breathing.  Her heart rate seemed to improve after about 10 minutes.  She said she has had a couple of brief episodes in the past but nothing that lasted this long.  She does not feel her heart racing currently.  The history is provided by the patient.  Palpitations Palpitations quality:  Fast Onset quality:  Sudden Duration:  10 minutes Timing:  Constant Progression:  Resolved Chronicity:  New Context: not stimulant use   Relieved by:  Breathing exercises Worsened by:  Nothing Ineffective treatments:  None tried Associated symptoms: chest pressure, malaise/fatigue and shortness of breath   Associated symptoms: no chest pain, no cough, no diaphoresis, no numbness, no syncope and no vomiting   Risk factors: hx of DVT       Past Medical History:  Diagnosis Date   Anginal pain (HCC)    Anti-cardiolipin antibody syndrome (HCC)    antiphospholipid antibody syndrome   Antiphospholipid antibody with hypercoagulable state ([place]) 11/07/2012   Arthritis    Asthma    well controlled, no rescue inhaler used   Blood dyscrasia    anticardiolipid syndrome   Blood transfusion    age 66 yr old-none since   Celiac disease    Diabetes mellitus    Type II   Diverticulitis    DVT (deep venous thrombosis) ([place]) 05/01/2011   "to liver"   Family history of adverse  reaction to anesthesia    N&V   Fibromyalgia    GERD (gastroesophageal reflux disease)    11/01/16- not problem   H/O seasonal allergies    Heart murmur    "not a problem"   Hypertension    NASH (nonalcoholic steatohepatitis) 05/01/2011   Nausea & vomiting    11-17-14 "nausea and vomiting after meals" at present discussed with Dr. Oletta Lamas office per pt.   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) 09/04/2012   Transaminasemia 09/04/2012   DVT (deep venous thrombosis) ([place]) 05/01/2011   Varices 05/01/2011   NASH (nonalcoholic steatohepatitis) 05/01/2011   Incisional hernia without mention of  obstruction or gangrene 02/14/2011    Past Surgical History:  Procedure Laterality Date   ABDOMINAL HYSTERECTOMY     ANKLE FRACTURE SURGERY Left    retained hardware   CHOLECYSTECTOMY     COLON SURGERY     hx. diverticulitis   COLONOSCOPY N/A 11/23/2014   Procedure: COLONOSCOPY;  Surgeon: [name], MD;  Location: [facility] ENDOSCOPY;  Service: Endoscopy;  Laterality: N/A;   ESOPHAGOGASTRODUODENOSCOPY  04/28/2011   Procedure: ESOPHAGOGASTRODUODENOSCOPY (EGD);  Surgeon: [name]., MD;  Location: [facility] ENDOSCOPY;  Service: Endoscopy;  Laterality: N/A;   ESOPHAGOGASTRODUODENOSCOPY N/A 11/23/2014   Procedure: ESOPHAGOGASTRODUODENOSCOPY (EGD);  Surgeon:  [name], MD;  Location: [facility] ENDOSCOPY;  Service: Endoscopy;  Laterality: N/A;   ESOPHAGOGASTRODUODENOSCOPY (EGD) WITH PROPOFOL N/A 11/06/2016   Procedure: ESOPHAGOGASTRODUODENOSCOPY (EGD) WITH PROPOFOL;  Surgeon: [name], MD;  Location: [facility];  Service: Endoscopy;  Laterality: N/A;   EUS N/A 09/25/2012   Procedure: ESOPHAGEAL ENDOSCOPIC ULTRASOUND (EUS) RADIAL;  Surgeon: Arta Silence, MD;  Location: [facility] ENDOSCOPY;  Service: Endoscopy;  Laterality: N/A;   HERNIA REPAIR     luq incisional   IVC FILTER INSERTION N/A 07/19/2020   Procedure: IVC FILTER INSERTION;  Surgeon: Waynetta Sandy, MD;  Location: [facility] CV LAB;  Service: Cardiovascular;  Laterality: N/A;   SPLENECTOMY, TOTAL     TONSILLECTOMY     TOTAL KNEE ARTHROPLASTY Right 07/21/2020   Procedure: TOTAL KNEE ARTHROPLASTY;  Surgeon: Susa Day, MD;  Location: [facility] ORS;  Service: Orthopedics;  Laterality: Right;  2.5 hrs     OB History   No obstetric history on file.     No family history on file.  Social History   Tobacco Use   Smoking status: Never   Smokeless tobacco: Never   Tobacco comments:    never used tobacco  Vaping Use   Vaping Use: Never used  Substance Use Topics   Alcohol use: No    Alcohol/week: 0.0 standard drinks   Drug use: No    Home Medications Prior to Admission medications   Medication Sig Start Date End Date Taking? Authorizing Provider  apixaban (ELIQUIS) 5 MG TABS tablet Take 5 mg by mouth 2 (two) times daily.    [provider]  benazepril (LOTENSIN) 20 MG tablet Take 20 mg by mouth every morning.    [provider]  CALCIUM PO Take 1 tablet by mouth 2 (two) times a week. Patient not taking: Reported on 09/06/2020    [provider]  cetirizine (ZYRTEC ALLERGY) 10 MG tablet Take 1 tablet (10 mg total) by mouth daily. Patient not taking: Reported on 07/06/2020 05/02/20 06/01/20  Tacy Learn, PA-C  Cholecalciferol (VITAMIN D3) 2000 UNITS  capsule Take 4,000 Units by mouth daily with lunch. Patient not taking: Reported on 09/06/2020    [provider]  docusate sodium (COLACE) 100 MG capsule Take 1 capsule (100 mg total) by mouth 2 (two) times daily as needed for mild constipation. 07/21/20   Susa Day, MD  famotidine (PEPCID) 20 MG tablet Take 1 tablet (20 mg total) by mouth 2 (two) times daily. Patient not taking: No sig reported 05/02/20   Suella Broad A, PA-C  glucose 4 GM chewable tablet Chew 16 g by mouth as needed for low blood sugar. Patient not taking: Reported on 09/06/2020    [provider]  hydrOXYzine (ATARAX/VISTARIL) 25 MG tablet Take 1 tablet (25 mg total) by mouth every 6 (six) hours as needed for itching. Patient not  taking: No sig reported 05/02/20   Tacy Learn, PA-C  LANTUS SOLOSTAR 100 UNIT/ML Solostar Pen Inject 24 Units into the skin at bedtime. 04/21/19   [provider]  Magnesium 250 MG TABS Take 250 mg by mouth daily with lunch. Patient not taking: Reported on 09/06/2020    [provider]  meclizine (ANTIVERT) 12.5 MG tablet Take 1 tablet (12.5 mg total) by mouth 3 (three) times daily as needed for dizziness. 09/16/20   Dorie Rank, MD  Misc Natural Products (BLOOD SUGAR BALANCE) TABS Take 1 tablet by mouth in the morning. Glucofort (blood sugar support formula )    [provider]  NEOMYCIN-POLYMYXIN-HYDROCORTISONE (CORTISPORIN) 1 % SOLN OTIC solution Place 3 drops into the left ear 4 (four) times daily. Patient not taking: Reported on 09/06/2020 08/15/17   [name], MD  niacin 500 MG tablet Take 500 mg by mouth daily with lunch. Patient not taking: Reported on 09/06/2020    [provider]  NON FORMULARY Take 80,000 Units by mouth daily with lunch. Serrapeptase    [provider]  ondansetron (ZOFRAN ODT) 8 MG disintegrating tablet Take 1 tablet (8 mg total) by mouth every 8 (eight) hours as needed for nausea or vomiting. 09/16/20   Dorie Rank, MD  ondansetron (ZOFRAN) 4 MG tablet Take 1 tablet (4 mg total) by mouth every 6 (six) hours as needed for nausea. 07/23/20   Cecilie Kicks, PA-C  OVER THE COUNTER MEDICATION Take 28.3 g by mouth 2 (two) times a week. Carbon-60 (2 tablespoons twice weekly) Patient not taking: Reported on 09/06/2020    [provider]  oxyCODONE (OXY IR/ROXICODONE) 5 MG immediate release tablet Take 1-2 tablets (5-10 mg total) by mouth every 4 (four) hours as needed for moderate pain or severe pain. 07/23/20   Cecilie Kicks, PA-C  polyethylene glycol (MIRALAX / GLYCOLAX) 17 g packet Take 17 g by mouth daily. 07/21/20   Susa Day, MD  PREMARIN vaginal cream PLACE 1 APPLICATORFUL VAGINALLY 2 TIMES A WEEK. MONDAY AND WEDNESDAY OR THURSDAY. Patient taking differently: Place 1 Applicatorful vaginally 2 (two) times a week. Mondays & Thursdays 06/07/20   [name], MD  propranolol ER (INDERAL LA) 80 MG 24 hr capsule Take 80 mg by mouth in the morning. 05/31/20   [provider]  thiamine (VITAMIN B-1) 100 MG tablet Take 200 mg by mouth daily with lunch. Patient not taking: Reported on 09/06/2020    [provider]  traMADol (ULTRAM) 50 MG tablet Take 1-2 tablets (50-100 mg total) by mouth every 6 (six) hours as needed (mild to moderate pain). 07/23/20   Cecilie Kicks, PA-C  vitamin B-12 (CYANOCOBALAMIN) 1000 MCG tablet Take 1,000 mcg by mouth 2 (two) times a week. Patient not taking: Reported on 09/06/2020    [provider]  vitamin C (ASCORBIC ACID) 500 MG tablet Take 500 mg by mouth 2 (two) times a week. Patient not taking: Reported on 09/06/2020    [provider]    Allergies    Hydrocodone, Lidocaine hcl, Piperacillin sod-tazobactam so, Cyclosporine, Hydromorphone hcl, Morphine and related, and Percocet [oxycodone-acetaminophen]  Review of Systems   Review of Systems  Constitutional:  Positive for malaise/fatigue. Negative for diaphoresis and fever.   HENT:  Negative for sore throat.   Eyes:  Negative for visual disturbance.  Respiratory:  Positive for chest tightness and shortness of breath. Negative for cough.   Cardiovascular:  Positive for palpitations. Negative for  chest pain and syncope.  Gastrointestinal:  Negative for abdominal pain and vomiting.  Genitourinary:  Negative for dysuria.  Musculoskeletal:  Positive for gait problem (r knee pain).  Skin:  Negative for rash.  Neurological:  Negative for numbness.   Physical Exam Updated Vital Signs BP (!) 159/89 (BP Location: Right Arm)   Pulse 77   Temp 98.6 F (37 C) (Oral)   Resp 14   Ht 5' 3.5" (1.613 m)   Wt 57.2 kg   SpO2 98%   BMI 21.97 kg/m   Physical Exam Vitals and nursing note reviewed.  Constitutional:      General: She is not in acute distress.    Appearance: Normal appearance. She is well-developed.  HENT:     Head: Normocephalic and atraumatic.  Eyes:     Conjunctiva/sclera: Conjunctivae normal.  Cardiovascular:     Rate and Rhythm: Normal rate and regular rhythm.     Pulses: Normal pulses.     Heart sounds: No murmur heard. Pulmonary:     Effort: Pulmonary effort is normal. No respiratory distress.     Breath sounds: Normal breath sounds.  Abdominal:     Palpations: Abdomen is soft.     Tenderness: There is no abdominal tenderness.  Musculoskeletal:        General: Tenderness (r knee) present. No deformity or signs of injury.     Cervical back: Neck supple.  Skin:    General: Skin is warm and dry.  Neurological:     General: No focal deficit present.     Mental Status: She is alert and oriented to person, place, and time.    ED Results / Procedures / Treatments   Labs (all labs ordered are listed, but only abnormal results are displayed) Labs Reviewed  BASIC METABOLIC PANEL - Abnormal; Notable for the following components:      Result Value   Glucose, Bld 230 (*)    All other components within normal limits  CBC  TROPONIN I (HIGH  SENSITIVITY)  TROPONIN I (HIGH SENSITIVITY)    EKG EKG Interpretation  Date/Time:  Sunday September 26 2020 13:01:17 EDT Ventricular Rate:  73 PR Interval:  177 QRS Duration: 89 QT Interval:  400 QTC Calculation: 441 R Axis:   52 Text Interpretation: Sinus rhythm Abnormal R-wave progression, late transition Confirmed by Aletta Edouard [phone]) on 09/26/2020 1:21:16 PM  Radiology DG Chest 2 View  Result Date: 09/26/2020 CLINICAL DATA:  Palpitations. EXAM: CHEST - 2 VIEW COMPARISON:  Chest x-ray dated March 04, 2020. FINDINGS: The heart size and mediastinal contours are within normal limits. Normal pulmonary vascularity. Minimal linear scarring in the lingula and right middle lobe is unchanged. No focal consolidation, pleural effusion, or pneumothorax. No acute osseous abnormality. IMPRESSION: 1. No acute cardiopulmonary disease. Electronically Signed   By: Titus Dubin M.D.   On: 09/26/2020 13:30    Procedures Procedures   Medications Ordered in ED Medications - No data to display  ED Course  I have reviewed the triage vital signs and the nursing notes.  Pertinent labs & imaging results that were available during my care of the patient were reviewed by me and considered in my medical decision making (see chart for details).  Clinical Course as of 09/26/20 1744  Sun Sep 26, 2020  1332 Chest x-ray interpreted by me as no acute findings. [MB]  5852 Patient has had no further arrhythmias here.  Recommended follow-up with PCP and get set up with an outpatient monitor.  Return instructions discussed [MB]    Clinical Course User Index [MB] Hayden Rasmussen, MD   MDM Rules/Calculators/A&P                         This patient complains of palpitations tachycardia; this involves an extensive number of treatment Options and is a complaint that carries with it a high risk of complications and Morbidity. The differential includes A. fib, SVT, sinus tach, arrhythmia, metabolic  derangement, anemia, ACS, pneumonia  I ordered, reviewed and interpreted labs, which included CBC with normal white count normal hemoglobin, chemistries normal other than mildly elevated glucose, troponin unremarkable do not feel she needs a delta I ordered imaging studies which included chest x-ray and I independently    visualized and interpreted imaging which showed no acute findings Additional history obtained from patient's husband Previous records obtained and reviewed in epic, no recent admissions  After the interventions stated above, I reevaluated the patient and found patient remains normal sinus rhythm rate around 70 or 80.  She has been asymptomatic.  Recommended close follow-up with PCP and arrangement for cardiac monitoring.  Return instructions discussed   Final Clinical Impression(s) / ED Diagnoses Final diagnoses:  Palpitations    Rx / DC Orders ED Discharge Orders     None        Hayden Rasmussen, MD 09/26/20 1746

## 2020-10-11 DIAGNOSIS — F321 Major depressive disorder, single episode, moderate: Secondary | ICD-10-CM | POA: Diagnosis not present

## 2020-10-11 DIAGNOSIS — Z794 Long term (current) use of insulin: Secondary | ICD-10-CM | POA: Diagnosis not present

## 2020-10-11 DIAGNOSIS — E119 Type 2 diabetes mellitus without complications: Secondary | ICD-10-CM | POA: Diagnosis not present

## 2020-10-11 DIAGNOSIS — R5383 Other fatigue: Secondary | ICD-10-CM | POA: Diagnosis not present

## 2020-10-20 ENCOUNTER — Other Ambulatory Visit: Payer: Self-pay | Admitting: Hematology & Oncology

## 2020-11-05 ENCOUNTER — Ambulatory Visit: Payer: PPO | Admitting: Vascular Surgery

## 2020-11-12 ENCOUNTER — Other Ambulatory Visit: Payer: Self-pay

## 2020-11-12 ENCOUNTER — Encounter: Payer: Self-pay | Admitting: Vascular Surgery

## 2020-11-12 ENCOUNTER — Ambulatory Visit (INDEPENDENT_AMBULATORY_CARE_PROVIDER_SITE_OTHER): Payer: PPO | Admitting: Vascular Surgery

## 2020-11-12 VITALS — BP 152/76 | HR 64 | Temp 98.0°F | Resp 14 | Ht 63.5 in | Wt 133.0 lb

## 2020-11-12 DIAGNOSIS — Z95828 Presence of other vascular implants and grafts: Secondary | ICD-10-CM | POA: Diagnosis not present

## 2020-11-12 NOTE — Progress Notes (Signed)
Patient ID: Laurie Green, female   DOB: 04-Feb-1946, 75 y.o.   MRN: [id number]  Reason for Consult: New Patient (Initial Visit) (To discuss filter removal)   Referred by Maurice Small, MD  Subjective:     HPI:  Laurie Green is a 75 y.o. female with history significant for multiple DVTs many years ago.  She did have a filter placed at [facility] over 20 years ago and states that it was never removed although there has been no evidence of the filter with our evaluation.  She recently underwent filter placement 3 months ago and subsequent total knee arthroplasty on the right.  Since that time she states that she has had significant pain in the right knee as well as anxiety.  She is back on her Eliquis and tolerating this well.  She is here today to discuss filter removal.  Past Medical History:  Diagnosis Date   Anginal pain ([place])    Anti-cardiolipin antibody syndrome (HCC)    antiphospholipid antibody syndrome   Antiphospholipid antibody with hypercoagulable state ([place]) 11/07/2012   Arthritis    Asthma    well controlled, no rescue inhaler used   Blood dyscrasia    anticardiolipid syndrome   Blood transfusion    age 38 yr old-none since   Celiac disease    Diabetes mellitus    Type II   Diverticulitis    DVT (deep venous thrombosis) ([place]) 05/01/2011   "to liver"   Family history of adverse reaction to anesthesia    N&V   Fibromyalgia    GERD (gastroesophageal reflux disease)    11/01/16- not problem   H/O seasonal allergies    Heart murmur    "not a problem"   Hypertension    NASH (nonalcoholic steatohepatitis) 05/01/2011   Nausea & vomiting    11-17-14 "nausea and vomiting after meals" at present discussed with Dr. Oletta Lamas office per pt.   Pneumonia 2021   PONV (postoperative nausea and vomiting)    'I dont have it if they give me something in the IV."   Portal hypertension (HCC)    Stroke ([place])    tia- eye,   TIA (transient ischemic attack)    Varices 05/01/2011   Varices,  esophageal (HCC)    Varices, gastric    Family History  Problem Relation Age of Onset   Cancer Mother    Alzheimer's disease Mother    Cancer Father    Heart defect Father    Past Surgical History:  Procedure Laterality Date   ABDOMINAL HYSTERECTOMY     ANKLE FRACTURE SURGERY Left    retained hardware   CHOLECYSTECTOMY     COLON SURGERY     hx. diverticulitis   COLONOSCOPY N/A 11/23/2014   Procedure: COLONOSCOPY;  Surgeon: [name], MD;  Location: [facility] ENDOSCOPY;  Service: Endoscopy;  Laterality: N/A;   ESOPHAGOGASTRODUODENOSCOPY  04/28/2011   Procedure: ESOPHAGOGASTRODUODENOSCOPY (EGD);  Surgeon: [name]., MD;  Location: [facility] ENDOSCOPY;  Service: Endoscopy;  Laterality: N/A;   ESOPHAGOGASTRODUODENOSCOPY N/A 11/23/2014   Procedure: ESOPHAGOGASTRODUODENOSCOPY (EGD);  Surgeon: [name], MD;  Location: [facility];  Service: Endoscopy;  Laterality: N/A;   ESOPHAGOGASTRODUODENOSCOPY (EGD) WITH PROPOFOL N/A 11/06/2016   Procedure: ESOPHAGOGASTRODUODENOSCOPY (EGD) WITH PROPOFOL;  Surgeon: [name], MD;  Location: [place];  Service: Endoscopy;  Laterality: N/A;   EUS N/A 09/25/2012   Procedure: ESOPHAGEAL ENDOSCOPIC ULTRASOUND (EUS) RADIAL;  Surgeon: Arta Silence, MD;  [facility]OS[facility];  Service: Endoscopy;  Laterality: N/A;  HERNIA REPAIR     luq incisional   IVC FILTER INSERTION N/A 07/19/2020   Procedure: IVC FILTER INSERTION;  Surgeon: Waynetta Sandy, MD;  Location: [place] CV LAB;  Service: Cardiovascular;  Laterality: N/A;   SPLENECTOMY, TOTAL     TONSILLECTOMY     TOTAL KNEE ARTHROPLASTY Right 07/21/2020   Procedure: TOTAL KNEE ARTHROPLASTY;  Surgeon: Susa Day, MD;  Location: [facility];  Service: Orthopedics;  Laterality: Right;  2.5 hrs    Short Social History:  Social History   Tobacco Use   Smoking status: Never   Smokeless tobacco: Never   Tobacco comments:    never used tobacco  Substance Use Topics   Alcohol use: No     Alcohol/week: 0.0 standard drinks    Allergies  Allergen Reactions   Hydrocodone Anaphylaxis and Nausea And Vomiting   Lidocaine Hcl Anaphylaxis   Piperacillin Sod-Tazobactam So Itching    "scalp itch"-was given Benadryl to counteract. "scalp itch"-was given Benadryl to counteract. "scalp itch"-was given Benadryl to counteract.   Cyclosporine Other (See Comments)    burns burns burns   Hydromorphone Hcl Nausea And Vomiting    Can take with zofran   Morphine And Related Nausea And Vomiting   Percocet [Oxycodone-Acetaminophen] Nausea And Vomiting    Current Outpatient Medications  Medication Sig Dispense Refill   benazepril (LOTENSIN) 20 MG tablet Take 20 mg by mouth every morning.     ELIQUIS 5 MG TABS tablet TAKE 1 TABLET BY MOUTH TWICE A DAY 60 tablet 6   famotidine (PEPCID) 20 MG tablet Take 1 tablet (20 mg total) by mouth 2 (two) times daily. 30 tablet 0   LANTUS SOLOSTAR 100 UNIT/ML Solostar Pen Inject 24 Units into the skin at bedtime.     Magnesium 250 MG TABS Take 250 mg by mouth daily with lunch.     Misc Natural Products (BLOOD SUGAR BALANCE) TABS Take 1 tablet by mouth in the morning. Glucofort (blood sugar support formula )     NON FORMULARY Take 80,000 Units by mouth daily with lunch. Serrapeptase     ondansetron (ZOFRAN ODT) 8 MG disintegrating tablet Take 1 tablet (8 mg total) by mouth every 8 (eight) hours as needed for nausea or vomiting. 20 tablet 0   PREMARIN vaginal cream PLACE 1 APPLICATORFUL VAGINALLY 2 TIMES A WEEK. MONDAY AND WEDNESDAY OR THURSDAY. (Patient taking differently: Place 1 Applicatorful vaginally 2 (two) times a week. Mondays & Thursdays) 30 g 3   propranolol ER (INDERAL LA) 80 MG 24 hr capsule Take 80 mg by mouth in the morning.     thiamine (VITAMIN B-1) 100 MG tablet Take 200 mg by mouth daily with lunch.     traMADol (ULTRAM) 50 MG tablet Take 1-2 tablets (50-100 mg total) by mouth every 6 (six) hours as needed (mild to moderate pain). 40  tablet 0   vitamin B-12 (CYANOCOBALAMIN) 1000 MCG tablet Take 1,000 mcg by mouth 2 (two) times a week.     vitamin C (ASCORBIC ACID) 500 MG tablet Take 500 mg by mouth 2 (two) times a week.     CALCIUM PO Take 1 tablet by mouth 2 (two) times a week. (Patient not taking: No sig reported)     cetirizine (ZYRTEC ALLERGY) 10 MG tablet Take 1 tablet (10 mg total) by mouth daily. (Patient not taking: Reported on 07/06/2020) 30 tablet 0   Cholecalciferol (VITAMIN D3) 2000 UNITS capsule Take 4,000 Units by mouth daily with lunch. (Patient  not taking: Reported on 09/06/2020)     docusate sodium (COLACE) 100 MG capsule Take 1 capsule (100 mg total) by mouth 2 (two) times daily as needed for mild constipation. 30 capsule 1   glucose 4 GM chewable tablet Chew 16 g by mouth as needed for low blood sugar. (Patient not taking: No sig reported)     hydrOXYzine (ATARAX/VISTARIL) 25 MG tablet Take 1 tablet (25 mg total) by mouth every 6 (six) hours as needed for itching. (Patient not taking: No sig reported) 12 tablet 0   meclizine (ANTIVERT) 12.5 MG tablet Take 1 tablet (12.5 mg total) by mouth 3 (three) times daily as needed for dizziness. 30 tablet 0   NEOMYCIN-POLYMYXIN-HYDROCORTISONE (CORTISPORIN) 1 % SOLN OTIC solution Place 3 drops into the left ear 4 (four) times daily. (Patient not taking: No sig reported) 10 mL 2   niacin 500 MG tablet Take 500 mg by mouth daily with lunch. (Patient not taking: Reported on 09/06/2020)     ondansetron (ZOFRAN) 4 MG tablet Take 1 tablet (4 mg total) by mouth every 6 (six) hours as needed for nausea. (Patient not taking: Reported on 11/12/2020) 40 tablet 0   OVER THE COUNTER MEDICATION Take 28.3 g by mouth 2 (two) times a week. Carbon-60 (2 tablespoons twice weekly) (Patient not taking: No sig reported)     oxyCODONE (OXY IR/ROXICODONE) 5 MG immediate release tablet Take 1-2 tablets (5-10 mg total) by mouth every 4 (four) hours as needed for moderate pain or severe pain. 30 tablet 0    polyethylene glycol (MIRALAX / GLYCOLAX) 17 g packet Take 17 g by mouth daily. 14 each 0   No current facility-administered medications for this visit.    Review of Systems  Constitutional: Positive for fatigue.  HENT: HENT negative.  Eyes: Eyes negative.  Cardiovascular: Positive for irregular heartbeat.  GI: Gastrointestinal negative.  Musculoskeletal: Musculoskeletal negative.  Skin: Skin negative.  Neurological: Neurological negative. Hematologic: Hematologic/lymphatic negative.  Psychiatric: Positive for sleep disturbance.       Anxiety      Objective:  Objective   Vitals:   11/12/20 1355  BP: (!) 152/76  Pulse: 64  Resp: 14  Temp: 98 F (36.7 C)  TempSrc: Temporal  SpO2: 95%  Weight: 133 lb (60.3 kg)  Height: 5' 3.5" (1.613 m)   Body mass index is 23.19 kg/m.  Physical Exam HENT:     Head: Normocephalic.     Nose:     Comments: Wearing a mask Eyes:     Pupils: Pupils are equal, round, and reactive to light.  Cardiovascular:     Rate and Rhythm: Normal rate.     Pulses: Normal pulses.  Pulmonary:     Effort: Pulmonary effort is normal.  Abdominal:     General: Abdomen is flat.     Palpations: Abdomen is soft.  Musculoskeletal:        General: Normal range of motion.     Comments: Well-healed right knee incision  Skin:    General: Skin is warm.  Neurological:     General: No focal deficit present.     Mental Status: She is alert. Mental status is at baseline.  Psychiatric:        Mood and Affect: Mood normal.        Thought Content: Thought content normal.        Judgment: Judgment normal.        Assessment/Plan:     75 year old female with history  of multiple DVTs recently had an IVC filter placed for total knee arthroplasty.  We have discussed removing her filter but at this time patient has significant anxiety and ongoing pain from her recent procedure and she is hopeful to delay.  I discussed with the patient calling to schedule filter  removal from a right IJ approach when she is ready and we will need to hold Eliquis for 48 hours prior.  She demonstrates good understanding states that she will call in the very near future to have the filter removed.     Waynetta Sandy MD [facility]

## 2020-11-18 DIAGNOSIS — F321 Major depressive disorder, single episode, moderate: Secondary | ICD-10-CM | POA: Diagnosis not present

## 2020-11-23 DIAGNOSIS — T8484XD Pain due to internal orthopedic prosthetic devices, implants and grafts, subsequent encounter: Secondary | ICD-10-CM | POA: Diagnosis not present

## 2020-11-23 DIAGNOSIS — D6869 Other thrombophilia: Secondary | ICD-10-CM | POA: Diagnosis not present

## 2020-11-24 DIAGNOSIS — M25512 Pain in left shoulder: Secondary | ICD-10-CM | POA: Diagnosis not present

## 2020-11-24 DIAGNOSIS — Z96659 Presence of unspecified artificial knee joint: Secondary | ICD-10-CM | POA: Diagnosis not present

## 2020-11-24 DIAGNOSIS — R52 Pain, unspecified: Secondary | ICD-10-CM | POA: Diagnosis not present

## 2020-12-08 ENCOUNTER — Inpatient Hospital Stay: Payer: PPO | Admitting: Hematology & Oncology

## 2020-12-08 ENCOUNTER — Telehealth: Payer: Self-pay

## 2020-12-08 ENCOUNTER — Inpatient Hospital Stay: Payer: PPO

## 2021-01-13 DIAGNOSIS — K1120 Sialoadenitis, unspecified: Secondary | ICD-10-CM | POA: Diagnosis not present

## 2021-01-14 ENCOUNTER — Other Ambulatory Visit: Payer: Self-pay

## 2021-01-14 ENCOUNTER — Emergency Department (HOSPITAL_BASED_OUTPATIENT_CLINIC_OR_DEPARTMENT_OTHER)
Admission: EM | Admit: 2021-01-14 | Discharge: 2021-01-15 | Disposition: A | Discharge status: Home / Self Care | Payer: PPO | Attending: Emergency Medicine | Admitting: Emergency Medicine

## 2021-01-14 ENCOUNTER — Encounter (HOSPITAL_BASED_OUTPATIENT_CLINIC_OR_DEPARTMENT_OTHER): Payer: Self-pay

## 2021-01-14 ENCOUNTER — Emergency Department (HOSPITAL_BASED_OUTPATIENT_CLINIC_OR_DEPARTMENT_OTHER): Payer: PPO

## 2021-01-14 DIAGNOSIS — R911 Solitary pulmonary nodule: Secondary | ICD-10-CM | POA: Diagnosis not present

## 2021-01-14 DIAGNOSIS — M549 Dorsalgia, unspecified: Secondary | ICD-10-CM | POA: Diagnosis not present

## 2021-01-14 DIAGNOSIS — R1013 Epigastric pain: Secondary | ICD-10-CM | POA: Diagnosis not present

## 2021-01-14 DIAGNOSIS — K8689 Other specified diseases of pancreas: Secondary | ICD-10-CM | POA: Diagnosis not present

## 2021-01-14 DIAGNOSIS — R1011 Right upper quadrant pain: Secondary | ICD-10-CM | POA: Diagnosis not present

## 2021-01-14 DIAGNOSIS — Z79899 Other long term (current) drug therapy: Secondary | ICD-10-CM | POA: Diagnosis not present

## 2021-01-14 DIAGNOSIS — I10 Essential (primary) hypertension: Secondary | ICD-10-CM | POA: Insufficient documentation

## 2021-01-14 DIAGNOSIS — R590 Localized enlarged lymph nodes: Secondary | ICD-10-CM | POA: Diagnosis not present

## 2021-01-14 DIAGNOSIS — Z96651 Presence of right artificial knee joint: Secondary | ICD-10-CM | POA: Insufficient documentation

## 2021-01-14 DIAGNOSIS — E119 Type 2 diabetes mellitus without complications: Secondary | ICD-10-CM | POA: Insufficient documentation

## 2021-01-14 DIAGNOSIS — Z7901 Long term (current) use of anticoagulants: Secondary | ICD-10-CM | POA: Insufficient documentation

## 2021-01-14 DIAGNOSIS — J45909 Unspecified asthma, uncomplicated: Secondary | ICD-10-CM | POA: Insufficient documentation

## 2021-01-14 DIAGNOSIS — Z794 Long term (current) use of insulin: Secondary | ICD-10-CM | POA: Insufficient documentation

## 2021-01-14 DIAGNOSIS — Z20822 Contact with and (suspected) exposure to covid-19: Secondary | ICD-10-CM | POA: Insufficient documentation

## 2021-01-14 DIAGNOSIS — K5730 Diverticulosis of large intestine without perforation or abscess without bleeding: Secondary | ICD-10-CM | POA: Diagnosis not present

## 2021-01-14 DIAGNOSIS — I700 Atherosclerosis of aorta: Secondary | ICD-10-CM | POA: Diagnosis not present

## 2021-01-14 LAB — CBC WITH DIFFERENTIAL/PLATELET
Abs Immature Granulocytes: 0.01 10*3/uL (ref 0.00–0.07)
Basophils Absolute: 0.1 10*3/uL (ref 0.0–0.1)
Basophils Relative: 1 %
Eosinophils Absolute: 0.2 10*3/uL (ref 0.0–0.5)
Eosinophils Relative: 4 %
HCT: 41 % (ref 36.0–46.0)
Hemoglobin: 14.2 g/dL (ref 12.0–15.0)
Immature Granulocytes: 0 %
Lymphocytes Relative: 30 %
Lymphs Abs: 1.8 10*3/uL (ref 0.7–4.0)
MCH: 34 pg (ref 26.0–34.0)
MCHC: 34.6 g/dL (ref 30.0–36.0)
MCV: 98.1 fL (ref 80.0–100.0)
Monocytes Absolute: 0.9 10*3/uL (ref 0.1–1.0)
Monocytes Relative: 15 %
Neutro Abs: 3 10*3/uL (ref 1.7–7.7)
Neutrophils Relative %: 50 %
Platelets: 280 10*3/uL (ref 150–400)
RBC: 4.18 MIL/uL (ref 3.87–5.11)
RDW: 12.7 % (ref 11.5–15.5)
WBC: 6 10*3/uL (ref 4.0–10.5)
nRBC: 0 % (ref 0.0–0.2)

## 2021-01-14 LAB — URINALYSIS, ROUTINE W REFLEX MICROSCOPIC
Bilirubin Urine: NEGATIVE
Glucose, UA: >1000 mg/dL — AB
Hgb urine dipstick: NEGATIVE
Ketones, ur: NEGATIVE mg/dL
Leukocytes,Ua: NEGATIVE
Nitrite: NEGATIVE
Protein, ur: NEGATIVE mg/dL
Specific Gravity, Urine: 1.03 (ref 1.005–1.030)
pH: 5.5 (ref 5.0–8.0)

## 2021-01-14 LAB — COMPREHENSIVE METABOLIC PANEL
ALT: 16 U/L (ref 0–44)
AST: 17 U/L (ref 15–41)
Albumin: 4 g/dL (ref 3.5–5.0)
Alkaline Phosphatase: 85 U/L (ref 38–126)
Anion gap: 8 (ref 5–15)
BUN: 22 mg/dL (ref 8–23)
CO2: 30 mmol/L (ref 22–32)
Calcium: 9.2 mg/dL (ref 8.9–10.3)
Chloride: 102 mmol/L (ref 98–111)
Creatinine, Ser: 0.84 mg/dL (ref 0.44–1.00)
GFR, Estimated: >60 mL/min (ref >60)
Glucose, Bld: 326 mg/dL — ABNORMAL HIGH (ref 70–99)
Potassium: 4 mmol/L (ref 3.5–5.1)
Sodium: 140 mmol/L (ref 135–145)
Total Bilirubin: 1 mg/dL (ref 0.3–1.2)
Total Protein: 6.6 g/dL (ref 6.5–8.1)

## 2021-01-14 LAB — RESP PANEL BY RT-PCR (FLU A&B, COVID) ARPGX2
Influenza A by PCR: NEGATIVE
Influenza B by PCR: NEGATIVE
SARS Coronavirus 2 by RT PCR: NEGATIVE

## 2021-01-14 LAB — LIPASE, BLOOD: Lipase: 20 U/L (ref 11–51)

## 2021-01-14 LAB — TROPONIN I (HIGH SENSITIVITY): Troponin I (High Sensitivity): 4 ng/L (ref <18)

## 2021-01-14 MED ORDER — IOHEXOL 350 MG/ML SOLN
100.0000 mL | Freq: Once | INTRAVENOUS | Status: AC | PRN
  Administered 2021-01-14: 100 mL via INTRAVENOUS

## 2021-01-14 MED ORDER — INSULIN ASPART 100 UNIT/ML IJ SOLN
2.0000 [IU] | Freq: Once | INTRAMUSCULAR | Status: AC
  Administered 2021-01-14: 2 [IU] via SUBCUTANEOUS

## 2021-01-14 MED ORDER — HYDRALAZINE HCL 20 MG/ML IJ SOLN
5.0000 mg | Freq: Once | INTRAMUSCULAR | Status: DC
  Filled 2021-01-14: qty 1

## 2021-01-14 MED ORDER — FAMOTIDINE IN NACL 20-0.9 MG/50ML-% IV SOLN
20.0000 mg | Freq: Once | INTRAVENOUS | Status: AC
  Administered 2021-01-14: 20 mg via INTRAVENOUS
  Filled 2021-01-14: qty 50

## 2021-01-14 MED ORDER — ONDANSETRON HCL 4 MG/2ML IJ SOLN
4.0000 mg | Freq: Once | INTRAMUSCULAR | Status: AC
  Administered 2021-01-14: 4 mg via INTRAVENOUS
  Filled 2021-01-14: qty 2

## 2021-01-14 MED ORDER — FENTANYL CITRATE PF 50 MCG/ML IJ SOSY
50.0000 ug | PREFILLED_SYRINGE | Freq: Once | INTRAMUSCULAR | Status: AC
  Administered 2021-01-14: 50 ug via INTRAVENOUS
  Filled 2021-01-14: qty 1

## 2021-01-14 NOTE — ED Provider Notes (Signed)
Laurie Green   CSN: [id number] Arrival date & time: 01/14/21  2121     History Chief Complaint  Patient presents with   Abdominal Pain    Laurie Green is a 75 y.o. female history of anti-phospholipid antibody syndrome status post splenectomy and IVC filter and on eliquis, here presenting with abdominal pain.  Patient ate a hamburger about 6 PM.  Patient states that she feels fine until about 8 PM she had sudden onset of sharp epigastric pain radiated to her back. She states that she felt nauseated and took some Zofran with no relief.  Patient denies any chest pain.  Patient states that she had multiple abdominal surgeries.  She had previous splenectomy as well as cholecystectomy and hysterectomy.  Patient also started on Augmentin yesterday for possible left salivary gland stone.   The history is provided by the patient.      Past Medical History:  Diagnosis Date   Anginal pain (HCC)    Anti-cardiolipin antibody syndrome (HCC)    antiphospholipid antibody syndrome   Antiphospholipid antibody with hypercoagulable state ([place]) 11/07/2012   Arthritis    Asthma    well controlled, no rescue inhaler used   Blood dyscrasia    anticardiolipid syndrome   Blood transfusion    age 61 yr old-none since   Celiac disease    Diabetes mellitus    Type II   Diverticulitis    DVT (deep venous thrombosis) ([place]) 05/01/2011   "to liver"   Family history of adverse reaction to anesthesia    N&V   Fibromyalgia    GERD (gastroesophageal reflux disease)    11/01/16- not problem   H/O seasonal allergies    Heart murmur    "not a problem"   Hypertension    NASH (nonalcoholic steatohepatitis) 05/01/2011   Nausea & vomiting    11-17-14 "nausea and vomiting after meals" at present discussed with Dr. Oletta Lamas office per pt.   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   Antiphospholipid antibody with hypercoagulable state ([place]) 11/07/2012   Transaminitis 09/07/2012   Fever, unspecified 09/07/2012   Abdominal pain 09/04/2012   Subtherapeutic international normalized ratio (INR) 09/04/2012   Diabetes mellitus ([place]) 09/04/2012   Leukocytosis, unspecified 09/04/2012   Varices, esophageal ([place]) 09/04/2012   Transaminasemia 09/04/2012   DVT (deep venous thrombosis) ([place]) 05/01/2011   Varices 05/01/2011   NASH (nonalcoholic steatohepatitis) 05/01/2011   Incisional hernia without mention of obstruction or gangrene 02/14/2011    Past Surgical History:  Procedure Laterality Date   ABDOMINAL HYSTERECTOMY     ANKLE FRACTURE SURGERY Left    retained hardware   CHOLECYSTECTOMY     COLON SURGERY     hx. diverticulitis   COLONOSCOPY N/A 11/23/2014   Procedure: COLONOSCOPY;  Surgeon: [name], MD;  [facility]OS[facility];  Service: Endoscopy;  Laterality: N/A;   ESOPHAGOGASTRODUODENOSCOPY  04/28/2011   Procedure: ESOPHAGOGASTRODUODENOSCOPY (EGD);  Surgeon: [name]., MD;  Location: [facility] ENDOSCOPY;  Service: Endoscopy;  Laterality: N/A;   ESOPHAGOGASTRODUODENOSCOPY N/A 11/23/2014  Procedure: ESOPHAGOGASTRODUODENOSCOPY (EGD);  Surgeon: [name], MD;  Location: [facility];  Service: Endoscopy;  Laterality: N/A;   ESOPHAGOGASTRODUODENOSCOPY (EGD) WITH PROPOFOL N/A 11/06/2016   Procedure: ESOPHAGOGASTRODUODENOSCOPY (EGD) WITH PROPOFOL;  Surgeon: [name], MD;  Location: [place];  Service: Endoscopy;  Laterality: N/A;   EUS N/A 09/25/2012   Procedure: ESOPHAGEAL ENDOSCOPIC ULTRASOUND (EUS) RADIAL;  Surgeon: Arta Silence, MD;  [facility];  Service: Endoscopy;  Laterality: N/A;   HERNIA  REPAIR     luq incisional   IVC FILTER INSERTION N/A 07/19/2020   Procedure: IVC FILTER INSERTION;  Surgeon: Waynetta Sandy, MD;  Location: [place] CV LAB;  Service: Cardiovascular;  Laterality: N/A;   SPLENECTOMY, TOTAL     TONSILLECTOMY     TOTAL KNEE ARTHROPLASTY Right 07/21/2020   Procedure: TOTAL KNEE ARTHROPLASTY;  Surgeon: Susa Day, MD;  [facility]: [facility];  Service: Orthopedics;  Laterality: Right;  2.5 hrs     OB History   No obstetric history on file.     Family History  Problem Relation Age of Onset   Cancer Mother    Alzheimer's disease Mother    Cancer Father    Heart defect Father     Social History   Tobacco Use   Smoking status: Never   Smokeless tobacco: Never   Tobacco comments:    never used tobacco  Vaping Use   Vaping Use: Never used  Substance Use Topics   Alcohol use: No    Alcohol/week: 0.0 standard drinks   Drug use: No    Home Medications Prior to Admission medications   Medication Sig Start Date End Date Taking? Authorizing Provider  benazepril (LOTENSIN) 20 MG tablet Take 20 mg by mouth every morning.    [provider]  CALCIUM PO Take 1 tablet by mouth 2 (two) times a week. Patient not taking: No sig reported    [provider]  cetirizine (ZYRTEC ALLERGY) 10 MG tablet Take 1 tablet (10 mg total) by mouth daily. Patient not taking: Reported on 07/06/2020 05/02/20 06/01/20  Tacy Learn, PA-C  Cholecalciferol (VITAMIN D3) 2000 UNITS capsule Take 4,000 Units by mouth daily with lunch. Patient not taking: Reported on 09/06/2020    [provider]  docusate sodium (COLACE) 100 MG capsule Take 1 capsule (100 mg total) by mouth 2 (two) times daily as needed for mild constipation. 07/21/20   Susa Day, MD  ELIQUIS 5 MG TABS tablet TAKE 1 TABLET BY MOUTH TWICE A DAY 10/20/20   [name], MD  famotidine (PEPCID) 20 MG tablet Take 1 tablet (20 mg total) by mouth 2 (two) times daily. 05/02/20    Tacy Learn, PA-C  glucose 4 GM chewable tablet Chew 16 g by mouth as needed for low blood sugar. Patient not taking: No sig reported    [provider]  hydrOXYzine (ATARAX/VISTARIL) 25 MG tablet Take 1 tablet (25 mg total) by mouth every 6 (six) hours as needed for itching. Patient not taking: No sig reported 05/02/20   Tacy Learn, PA-C  LANTUS SOLOSTAR 100 UNIT/ML Solostar Pen Inject 24 Units into the skin at bedtime. 04/21/19   [provider]  Magnesium 250 MG TABS Take 250 mg by mouth daily with lunch.    [provider]  meclizine (ANTIVERT) 12.5 MG tablet Take 1 tablet (12.5 mg total) by mouth 3 (three) times daily as needed for dizziness. 09/16/20   Dorie Rank, MD  Misc Natural Products (BLOOD  SUGAR BALANCE) TABS Take 1 tablet by mouth in the morning. Glucofort (blood sugar support formula )    [provider]  NEOMYCIN-POLYMYXIN-HYDROCORTISONE (CORTISPORIN) 1 % SOLN OTIC solution Place 3 drops into the left ear 4 (four) times daily. Patient not taking: No sig reported 08/15/17   [name], MD  niacin 500 MG tablet Take 500 mg by mouth daily with lunch. Patient not taking: Reported on 09/06/2020    [provider]  NON FORMULARY Take 80,000 Units by mouth daily with lunch. Serrapeptase    [provider]  ondansetron (ZOFRAN ODT) 8 MG disintegrating tablet Take 1 tablet (8 mg total) by mouth every 8 (eight) hours as needed for nausea or vomiting. 09/16/20   Dorie Rank, MD  ondansetron (ZOFRAN) 4 MG tablet Take 1 tablet (4 mg total) by mouth every 6 (six) hours as needed for nausea. Patient not taking: Reported on 11/12/2020 07/23/20   Cecilie Kicks, PA-C  OVER THE COUNTER MEDICATION Take 28.3 g by mouth 2 (two) times a week. Carbon-60 (2 tablespoons twice weekly) Patient not taking: No sig reported    [provider]  oxyCODONE (OXY IR/ROXICODONE) 5 MG immediate release tablet Take 1-2 tablets (5-10 mg total) by mouth  every 4 (four) hours as needed for moderate pain or severe pain. 07/23/20   Cecilie Kicks, PA-C  polyethylene glycol (MIRALAX / GLYCOLAX) 17 g packet Take 17 g by mouth daily. 07/21/20   Susa Day, MD  PREMARIN vaginal cream PLACE 1 APPLICATORFUL VAGINALLY 2 TIMES A WEEK. MONDAY AND WEDNESDAY OR THURSDAY. Patient taking differently: Place 1 Applicatorful vaginally 2 (two) times a week. Mondays & Thursdays 06/07/20   [name], MD  propranolol ER (INDERAL LA) 80 MG 24 hr capsule Take 80 mg by mouth in the morning. 05/31/20   [provider]  thiamine (VITAMIN B-1) 100 MG tablet Take 200 mg by mouth daily with lunch.    [provider]  traMADol (ULTRAM) 50 MG tablet Take 1-2 tablets (50-100 mg total) by mouth every 6 (six) hours as needed (mild to moderate pain). 07/23/20   Cecilie Kicks, PA-C  vitamin B-12 (CYANOCOBALAMIN) 1000 MCG tablet Take 1,000 mcg by mouth 2 (two) times a week.    [provider]  vitamin C (ASCORBIC ACID) 500 MG tablet Take 500 mg by mouth 2 (two) times a week.    [provider]    Allergies    Hydrocodone, Lidocaine hcl, Piperacillin sod-tazobactam so, Cyclosporine, Hydromorphone hcl, Morphine and related, and Percocet [oxycodone-acetaminophen]  Review of Systems   Review of Systems  Gastrointestinal:  Positive for abdominal pain and nausea.  All other systems reviewed and are negative.  Physical Exam Updated Vital Signs BP (!) 195/101 (BP Location: Right Arm)   Pulse 88   Temp 98.6 F (37 C) (Oral)   Resp 17   Ht 5' 3"  (1.6 m)   Wt 58.5 kg   SpO2 100%   BMI 22.85 kg/m   Physical Exam Vitals and nursing Green reviewed.  Constitutional:      Comments: Uncomfortable, writhing around in pain  HENT:     Head: Normocephalic.     Mouth/Throat:     Mouth: Mucous membranes are moist.  Eyes:     Extraocular Movements: Extraocular movements intact.     Pupils: Pupils are equal, round, and reactive to light.   Cardiovascular:     Rate and Rhythm: Normal rate and regular rhythm.  Heart sounds: Normal heart sounds.  Pulmonary:     Effort: Pulmonary effort is normal.     Breath sounds: Normal breath sounds.  Abdominal:     General: Abdomen is flat.     Comments: + Epigastric tenderness and right upper quadrant tenderness.  Multiple abdominal scars well-healed  Skin:    General: Skin is warm.     Capillary Refill: Capillary refill takes less than 2 seconds.  Neurological:     General: No focal deficit present.     Mental Status: She is oriented to person, place, and time.  Psychiatric:        Mood and Affect: Mood normal.        Behavior: Behavior normal.    ED Results / Procedures / Treatments   Labs (all labs ordered are listed, but only abnormal results are displayed) Labs Reviewed  RESP PANEL BY RT-PCR (FLU A&B, COVID) ARPGX2  CBC WITH DIFFERENTIAL/PLATELET  COMPREHENSIVE METABOLIC PANEL  LIPASE, BLOOD  URINALYSIS, ROUTINE W REFLEX MICROSCOPIC  TROPONIN I (HIGH SENSITIVITY)    EKG None  Radiology No results found.  Procedures Procedures   Medications Ordered in ED Medications  ondansetron (ZOFRAN) injection 4 mg (has no administration in time range)  fentaNYL (SUBLIMAZE) injection 50 mcg (has no administration in time range)  hydrALAZINE (APRESOLINE) injection 5 mg (has no administration in time range)    ED Course  I have reviewed the triage vital signs and the nursing notes.  Pertinent labs & imaging results that were available during my care of the patient were reviewed by me and considered in my medical decision making (see chart for details).    MDM Rules/Calculators/A&P                           Laurie Green is a 75 y.o. female here presenting with abdominal pain and back pain.  Consider biliary colic versus pancreatitis versus dissection versus renal colic versus small bowel obstruction.  Patient is hypertensive and writhing around in pain.  Will  give antihypertensives and check CBC and CMP and lipase and do dissection study  10:43 PM Labs showed glucose is 300.  Normal anion gap.  Patient's blood pressure is down to 130s after pain meds.  Dissection study pending.  Signed out to Dr. Leonette Monarch in the ED to follow up dissection study and get repeat trop and reassess patient.   Final Clinical Impression(s) / ED Diagnoses Final diagnoses:  None    Rx / DC Orders ED Discharge Orders     None        Drenda Freeze, MD 01/14/21 2252

## 2021-01-14 NOTE — ED Triage Notes (Addendum)
Pt is present for epigastric pain that radiates to the RUQ that started after she ate a hamburger appx two hours ago. Pt also c/o slight nausea. Hx of IBS and does not have a gallbladder. Pt took PO Zofran about one hour ago. Having difficulty lying down or sitting due to the pain.

## 2021-01-15 LAB — TROPONIN I (HIGH SENSITIVITY): Troponin I (High Sensitivity): 3 ng/L (ref <18)

## 2021-01-15 MED ORDER — HYOSCYAMINE SULFATE 0.125 MG SL SUBL
0.1250 mg | SUBLINGUAL_TABLET | Freq: Once | SUBLINGUAL | Status: AC
  Administered 2021-01-15: 0.125 mg via SUBLINGUAL
  Filled 2021-01-15: qty 1

## 2021-01-15 MED ORDER — SUCRALFATE 1 GM/10ML PO SUSP
1.0000 g | Freq: Once | ORAL | Status: AC
  Administered 2021-01-15: 1 g via ORAL
  Filled 2021-01-15: qty 10

## 2021-01-15 MED ORDER — ALUM & MAG HYDROXIDE-SIMETH 200-200-20 MG/5ML PO SUSP
30.0000 mL | Freq: Once | ORAL | Status: AC
  Administered 2021-01-15: 30 mL via ORAL
  Filled 2021-01-15: qty 30

## 2021-01-15 MED ORDER — FENTANYL CITRATE PF 50 MCG/ML IJ SOSY
50.0000 ug | PREFILLED_SYRINGE | Freq: Once | INTRAMUSCULAR | Status: AC
  Administered 2021-01-15: 50 ug via INTRAVENOUS
  Filled 2021-01-15: qty 1

## 2021-01-19 ENCOUNTER — Other Ambulatory Visit: Payer: Self-pay

## 2021-01-19 ENCOUNTER — Inpatient Hospital Stay: Payer: PPO | Admitting: Hematology & Oncology

## 2021-01-19 ENCOUNTER — Inpatient Hospital Stay: Payer: PPO | Attending: Hematology & Oncology

## 2021-01-19 ENCOUNTER — Encounter: Payer: Self-pay | Admitting: Hematology & Oncology

## 2021-01-19 VITALS — BP 158/82 | HR 59 | Temp 98.3°F | Resp 18 | Wt 134.0 lb

## 2021-01-19 DIAGNOSIS — D6861 Antiphospholipid syndrome: Secondary | ICD-10-CM

## 2021-01-19 DIAGNOSIS — Z86718 Personal history of other venous thrombosis and embolism: Secondary | ICD-10-CM | POA: Insufficient documentation

## 2021-01-19 DIAGNOSIS — E119 Type 2 diabetes mellitus without complications: Secondary | ICD-10-CM | POA: Diagnosis not present

## 2021-01-19 DIAGNOSIS — Z7901 Long term (current) use of anticoagulants: Secondary | ICD-10-CM | POA: Diagnosis not present

## 2021-01-19 DIAGNOSIS — K7581 Nonalcoholic steatohepatitis (NASH): Secondary | ICD-10-CM | POA: Diagnosis not present

## 2021-01-19 DIAGNOSIS — Z79899 Other long term (current) drug therapy: Secondary | ICD-10-CM | POA: Insufficient documentation

## 2021-01-19 DIAGNOSIS — Z794 Long term (current) use of insulin: Secondary | ICD-10-CM | POA: Diagnosis not present

## 2021-01-19 LAB — CBC WITH DIFFERENTIAL (CANCER CENTER ONLY)
Abs Immature Granulocytes: 0.02 10*3/uL (ref 0.00–0.07)
Basophils Absolute: 0.1 10*3/uL (ref 0.0–0.1)
Basophils Relative: 1 %
Eosinophils Absolute: 0.2 10*3/uL (ref 0.0–0.5)
Eosinophils Relative: 4 %
HCT: 40.8 % (ref 36.0–46.0)
Hemoglobin: 14.4 g/dL (ref 12.0–15.0)
Immature Granulocytes: 0 %
Lymphocytes Relative: 30 %
Lymphs Abs: 1.9 10*3/uL (ref 0.7–4.0)
MCH: 34.4 pg — ABNORMAL HIGH (ref 26.0–34.0)
MCHC: 35.3 g/dL (ref 30.0–36.0)
MCV: 97.6 fL (ref 80.0–100.0)
Monocytes Absolute: 0.7 10*3/uL (ref 0.1–1.0)
Monocytes Relative: 12 %
Neutro Abs: 3.4 10*3/uL (ref 1.7–7.7)
Neutrophils Relative %: 53 %
Platelet Count: 295 10*3/uL (ref 150–400)
RBC: 4.18 MIL/uL (ref 3.87–5.11)
RDW: 12.1 % (ref 11.5–15.5)
WBC Count: 6.4 10*3/uL (ref 4.0–10.5)
nRBC: 0 % (ref 0.0–0.2)

## 2021-01-19 LAB — CMP (CANCER CENTER ONLY)
ALT: 17 U/L (ref 0–44)
AST: 21 U/L (ref 15–41)
Albumin: 4 g/dL (ref 3.5–5.0)
Alkaline Phosphatase: 79 U/L (ref 38–126)
Anion gap: 7 (ref 5–15)
BUN: 20 mg/dL (ref 8–23)
CO2: 32 mmol/L (ref 22–32)
Calcium: 9.6 mg/dL (ref 8.9–10.3)
Chloride: 101 mmol/L (ref 98–111)
Creatinine: 0.7 mg/dL (ref 0.44–1.00)
GFR, Estimated: >60 mL/min (ref >60)
Glucose, Bld: 142 mg/dL — ABNORMAL HIGH (ref 70–99)
Potassium: 3.7 mmol/L (ref 3.5–5.1)
Sodium: 140 mmol/L (ref 135–145)
Total Bilirubin: 1.8 mg/dL — ABNORMAL HIGH (ref 0.3–1.2)
Total Protein: 6.4 g/dL — ABNORMAL LOW (ref 6.5–8.1)

## 2021-01-19 LAB — LACTATE DEHYDROGENASE: LDH: 182 U/L (ref 98–192)
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
## 2021-02-15 ENCOUNTER — Telehealth: Payer: Self-pay

## 2021-02-15 DIAGNOSIS — Z794 Long term (current) use of insulin: Secondary | ICD-10-CM

## 2021-02-15 DIAGNOSIS — I8500 Esophageal varices without bleeding: Secondary | ICD-10-CM

## 2021-02-15 DIAGNOSIS — E0800 Diabetes mellitus due to underlying condition with hyperosmolarity without nonketotic hyperglycemic-hyperosmolar coma (NKHHC): Secondary | ICD-10-CM

## 2021-02-15 DIAGNOSIS — I82401 Acute embolism and thrombosis of unspecified deep veins of right lower extremity: Secondary | ICD-10-CM

## 2021-02-15 MED ORDER — LANTUS SOLOSTAR 100 UNIT/ML ~~LOC~~ SOPN
24.0000 [IU] | PEN_INJECTOR | Freq: Every day | SUBCUTANEOUS | 1 refills | Status: AC
Start: 2021-02-15 — End: ?

## 2021-02-15 MED ORDER — PROPRANOLOL HCL ER 80 MG PO CP24: 80.0000 mg | ORAL_CAPSULE | Freq: Every morning | ORAL | 1 refills | Status: DC

## 2021-02-15 MED ORDER — BENAZEPRIL HCL 20 MG PO TABS: 20.0000 mg | ORAL_TABLET | Freq: Every morning | ORAL | 1 refills | Status: DC

## 2021-02-15 NOTE — Telephone Encounter (Signed)
Pt called in stating her PCP from [facility] left and she is in need of her Benazepril, Propanolol and Solostar Pen refilled. Pt was told that she needed to come in and sign up with another PCP but that they did not have any upcoming new pt apts. Pt would like to know if Dr Marin Olp would refill these meds until she finds another PCP.  Spoke with Dr Marin Olp who states that would be fine to refill those 3 meds for this month and next.  Called pt and advised her of his this and she requested rx's be sent to [facility] on [address]. Rx's sent.

## 2021-04-26 ENCOUNTER — Encounter: Payer: Self-pay | Admitting: Hematology & Oncology

## 2021-04-27 ENCOUNTER — Inpatient Hospital Stay: Payer: PPO | Attending: Hematology & Oncology

## 2021-04-27 ENCOUNTER — Ambulatory Visit: Payer: PPO | Admitting: Hematology & Oncology

## 2021-04-27 ENCOUNTER — Encounter: Payer: Self-pay | Admitting: Hematology & Oncology

## 2021-04-27 ENCOUNTER — Other Ambulatory Visit: Payer: Self-pay

## 2021-04-27 ENCOUNTER — Inpatient Hospital Stay: Payer: PPO | Admitting: Hematology & Oncology

## 2021-04-27 ENCOUNTER — Other Ambulatory Visit: Payer: PPO

## 2021-04-27 VITALS — BP 161/64 | HR 64 | Temp 98.8°F | Resp 17 | Wt 139.8 lb

## 2021-04-27 DIAGNOSIS — D6861 Antiphospholipid syndrome: Secondary | ICD-10-CM | POA: Diagnosis not present

## 2021-04-27 DIAGNOSIS — Z7901 Long term (current) use of anticoagulants: Secondary | ICD-10-CM | POA: Insufficient documentation

## 2021-04-27 DIAGNOSIS — Z86718 Personal history of other venous thrombosis and embolism: Secondary | ICD-10-CM | POA: Diagnosis not present

## 2021-04-27 DIAGNOSIS — Z794 Long term (current) use of insulin: Secondary | ICD-10-CM | POA: Insufficient documentation

## 2021-04-27 DIAGNOSIS — K7581 Nonalcoholic steatohepatitis (NASH): Secondary | ICD-10-CM | POA: Diagnosis not present

## 2021-04-27 DIAGNOSIS — Z79899 Other long term (current) drug therapy: Secondary | ICD-10-CM | POA: Diagnosis not present

## 2021-04-27 DIAGNOSIS — E119 Type 2 diabetes mellitus without complications: Secondary | ICD-10-CM | POA: Insufficient documentation

## 2021-04-27 LAB — CMP (CANCER CENTER ONLY)
ALT: 21 U/L (ref 0–44)
AST: 23 U/L (ref 15–41)
Albumin: 4.2 g/dL (ref 3.5–5.0)
Alkaline Phosphatase: 82 U/L (ref 38–126)
Anion gap: 7 (ref 5–15)
BUN: 23 mg/dL (ref 8–23)
CO2: 33 mmol/L — ABNORMAL HIGH (ref 22–32)
Calcium: 10.4 mg/dL — ABNORMAL HIGH (ref 8.9–10.3)
Chloride: 106 mmol/L (ref 98–111)
Creatinine: 0.73 mg/dL (ref 0.44–1.00)
GFR, Estimated: >60 mL/min (ref >60)
Glucose, Bld: 163 mg/dL — ABNORMAL HIGH (ref 70–99)
Potassium: 4.2 mmol/L (ref 3.5–5.1)
Sodium: 146 mmol/L — ABNORMAL HIGH (ref 135–145)
Total Bilirubin: 1.5 mg/dL — ABNORMAL HIGH (ref 0.3–1.2)
Total Protein: 6.6 g/dL (ref 6.5–8.1)

## 2021-04-27 LAB — CBC WITH DIFFERENTIAL (CANCER CENTER ONLY)
Abs Immature Granulocytes: 0.02 10*3/uL (ref 0.00–0.07)
Basophils Absolute: 0.1 10*3/uL (ref 0.0–0.1)
Basophils Relative: 1 %
Eosinophils Absolute: 0.2 10*3/uL (ref 0.0–0.5)
Eosinophils Relative: 4 %
HCT: 42.2 % (ref 36.0–46.0)
Hemoglobin: 14.6 g/dL (ref 12.0–15.0)
Immature Granulocytes: 0 %
Lymphocytes Relative: 25 %
Lymphs Abs: 1.7 10*3/uL (ref 0.7–4.0)
MCH: 34 pg (ref 26.0–34.0)
MCHC: 34.6 g/dL (ref 30.0–36.0)
MCV: 98.4 fL (ref 80.0–100.0)
Monocytes Absolute: 0.8 10*3/uL (ref 0.1–1.0)
Monocytes Relative: 12 %
Neutro Abs: 4 10*3/uL (ref 1.7–7.7)
Neutrophils Relative %: 58 %
Platelet Count: 287 10*3/uL (ref 150–400)
RBC: 4.29 MIL/uL (ref 3.87–5.11)
RDW: 13 % (ref 11.5–15.5)
WBC Count: 6.9 10*3/uL (ref 4.0–10.5)
nRBC: 0 % (ref 0.0–0.2)

## 2021-04-27 LAB — LACTATE DEHYDROGENASE: LDH: 201 U/L — ABNORMAL HIGH (ref 98–192)
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
## 2021-05-17 DIAGNOSIS — Z1231 Encounter for screening mammogram for malignant neoplasm of breast: Secondary | ICD-10-CM | POA: Diagnosis not present

## 2021-05-18 DIAGNOSIS — I10 Essential (primary) hypertension: Secondary | ICD-10-CM | POA: Diagnosis not present

## 2021-05-18 DIAGNOSIS — D6861 Antiphospholipid syndrome: Secondary | ICD-10-CM | POA: Diagnosis not present

## 2021-05-18 DIAGNOSIS — E119 Type 2 diabetes mellitus without complications: Secondary | ICD-10-CM | POA: Diagnosis not present

## 2021-06-07 DIAGNOSIS — E119 Type 2 diabetes mellitus without complications: Secondary | ICD-10-CM | POA: Diagnosis not present

## 2021-06-07 DIAGNOSIS — I10 Essential (primary) hypertension: Secondary | ICD-10-CM | POA: Diagnosis not present

## 2021-06-26 ENCOUNTER — Other Ambulatory Visit: Payer: Self-pay | Admitting: Hematology & Oncology

## 2021-06-27 ENCOUNTER — Encounter: Payer: Self-pay | Admitting: Hematology & Oncology

## 2021-07-05 DIAGNOSIS — K1120 Sialoadenitis, unspecified: Secondary | ICD-10-CM | POA: Diagnosis not present

## 2021-07-07 DIAGNOSIS — K1120 Sialoadenitis, unspecified: Secondary | ICD-10-CM | POA: Diagnosis not present

## 2021-07-26 ENCOUNTER — Encounter: Payer: Self-pay | Admitting: Hematology & Oncology

## 2021-07-26 ENCOUNTER — Inpatient Hospital Stay: Payer: PPO | Attending: Hematology & Oncology

## 2021-07-26 ENCOUNTER — Other Ambulatory Visit: Payer: Self-pay

## 2021-07-26 ENCOUNTER — Inpatient Hospital Stay: Payer: PPO | Admitting: Hematology & Oncology

## 2021-07-26 VITALS — BP 147/72 | HR 62 | Temp 98.0°F | Resp 18 | Ht 63.0 in | Wt 141.0 lb

## 2021-07-26 DIAGNOSIS — Z86718 Personal history of other venous thrombosis and embolism: Secondary | ICD-10-CM | POA: Insufficient documentation

## 2021-07-26 DIAGNOSIS — E119 Type 2 diabetes mellitus without complications: Secondary | ICD-10-CM | POA: Diagnosis not present

## 2021-07-26 DIAGNOSIS — D6861 Antiphospholipid syndrome: Secondary | ICD-10-CM | POA: Diagnosis not present

## 2021-07-26 DIAGNOSIS — Z794 Long term (current) use of insulin: Secondary | ICD-10-CM | POA: Insufficient documentation

## 2021-07-26 DIAGNOSIS — Z79899 Other long term (current) drug therapy: Secondary | ICD-10-CM | POA: Diagnosis not present

## 2021-07-26 DIAGNOSIS — Z7901 Long term (current) use of anticoagulants: Secondary | ICD-10-CM | POA: Diagnosis not present

## 2021-07-26 LAB — CBC WITH DIFFERENTIAL (CANCER CENTER ONLY)
Abs Immature Granulocytes: 0.01 10*3/uL (ref 0.00–0.07)
Basophils Absolute: 0.1 10*3/uL (ref 0.0–0.1)
Basophils Relative: 1 %
Eosinophils Absolute: 0.5 10*3/uL (ref 0.0–0.5)
Eosinophils Relative: 7 %
HCT: 40.9 % (ref 36.0–46.0)
Hemoglobin: 14 g/dL (ref 12.0–15.0)
Immature Granulocytes: 0 %
Lymphocytes Relative: 24 %
Lymphs Abs: 1.7 10*3/uL (ref 0.7–4.0)
MCH: 33.3 pg (ref 26.0–34.0)
MCHC: 34.2 g/dL (ref 30.0–36.0)
MCV: 97.1 fL (ref 80.0–100.0)
Monocytes Absolute: 0.8 10*3/uL (ref 0.1–1.0)
Monocytes Relative: 12 %
Neutro Abs: 3.9 10*3/uL (ref 1.7–7.7)
Neutrophils Relative %: 56 %
Platelet Count: 282 10*3/uL (ref 150–400)
RBC: 4.21 MIL/uL (ref 3.87–5.11)
RDW: 12.7 % (ref 11.5–15.5)
WBC Count: 7 10*3/uL (ref 4.0–10.5)
nRBC: 0 % (ref 0.0–0.2)

## 2021-07-26 LAB — CMP (CANCER CENTER ONLY)
ALT: 19 U/L (ref 0–44)
AST: 22 U/L (ref 15–41)
Albumin: 4 g/dL (ref 3.5–5.0)
Alkaline Phosphatase: 77 U/L (ref 38–126)
Anion gap: 7 (ref 5–15)
BUN: 16 mg/dL (ref 8–23)
CO2: 30 mmol/L (ref 22–32)
Calcium: 10 mg/dL (ref 8.9–10.3)
Chloride: 104 mmol/L (ref 98–111)
Creatinine: 0.72 mg/dL (ref 0.44–1.00)
GFR, Estimated: >60 mL/min (ref >60)
Glucose, Bld: 195 mg/dL — ABNORMAL HIGH (ref 70–99)
Potassium: 4.1 mmol/L (ref 3.5–5.1)
Sodium: 141 mmol/L (ref 135–145)
Total Bilirubin: 1 mg/dL (ref 0.3–1.2)
Total Protein: 6.5 g/dL (ref 6.5–8.1)

## 2021-07-26 LAB — LACTATE DEHYDROGENASE: LDH: 201 U/L — ABNORMAL HIGH (ref 98–192)
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
## 2021-08-22 DIAGNOSIS — E119 Type 2 diabetes mellitus without complications: Secondary | ICD-10-CM | POA: Diagnosis not present

## 2021-10-11 ENCOUNTER — Other Ambulatory Visit: Payer: PPO

## 2021-11-01 ENCOUNTER — Inpatient Hospital Stay: Payer: PPO | Attending: Hematology & Oncology

## 2021-11-01 ENCOUNTER — Encounter: Payer: Self-pay | Admitting: Hematology & Oncology

## 2021-11-01 ENCOUNTER — Inpatient Hospital Stay: Payer: PPO | Admitting: Hematology & Oncology

## 2021-11-01 ENCOUNTER — Other Ambulatory Visit: Payer: Self-pay

## 2021-11-01 VITALS — BP 157/65 | HR 59 | Temp 98.1°F | Resp 18 | Wt 144.0 lb

## 2021-11-01 DIAGNOSIS — Z7901 Long term (current) use of anticoagulants: Secondary | ICD-10-CM | POA: Insufficient documentation

## 2021-11-01 DIAGNOSIS — Z86718 Personal history of other venous thrombosis and embolism: Secondary | ICD-10-CM | POA: Diagnosis not present

## 2021-11-01 DIAGNOSIS — D6861 Antiphospholipid syndrome: Secondary | ICD-10-CM | POA: Diagnosis not present

## 2021-11-01 DIAGNOSIS — Z794 Long term (current) use of insulin: Secondary | ICD-10-CM | POA: Diagnosis not present

## 2021-11-01 DIAGNOSIS — K7581 Nonalcoholic steatohepatitis (NASH): Secondary | ICD-10-CM | POA: Insufficient documentation

## 2021-11-01 LAB — CBC WITH DIFFERENTIAL (CANCER CENTER ONLY)
Abs Immature Granulocytes: 0.01 10*3/uL (ref 0.00–0.07)
Basophils Absolute: 0.1 10*3/uL (ref 0.0–0.1)
Basophils Relative: 2 %
Eosinophils Absolute: 0.3 10*3/uL (ref 0.0–0.5)
Eosinophils Relative: 6 %
HCT: 42.8 % (ref 36.0–46.0)
Hemoglobin: 14.7 g/dL (ref 12.0–15.0)
Immature Granulocytes: 0 %
Lymphocytes Relative: 35 %
Lymphs Abs: 1.9 10*3/uL (ref 0.7–4.0)
MCH: 34 pg (ref 26.0–34.0)
MCHC: 34.3 g/dL (ref 30.0–36.0)
MCV: 99.1 fL (ref 80.0–100.0)
Monocytes Absolute: 0.7 10*3/uL (ref 0.1–1.0)
Monocytes Relative: 13 %
Neutro Abs: 2.4 10*3/uL (ref 1.7–7.7)
Neutrophils Relative %: 44 %
Platelet Count: 273 10*3/uL (ref 150–400)
RBC: 4.32 MIL/uL (ref 3.87–5.11)
RDW: 12.9 % (ref 11.5–15.5)
WBC Count: 5.4 10*3/uL (ref 4.0–10.5)
nRBC: 0 % (ref 0.0–0.2)

## 2021-11-01 LAB — CMP (CANCER CENTER ONLY)
ALT: 16 U/L (ref 0–44)
AST: 21 U/L (ref 15–41)
Albumin: 4.3 g/dL (ref 3.5–5.0)
Alkaline Phosphatase: 78 U/L (ref 38–126)
Anion gap: 8 (ref 5–15)
BUN: 16 mg/dL (ref 8–23)
CO2: 31 mmol/L (ref 22–32)
Calcium: 9.9 mg/dL (ref 8.9–10.3)
Chloride: 107 mmol/L (ref 98–111)
Creatinine: 0.8 mg/dL (ref 0.44–1.00)
GFR, Estimated: >60 mL/min (ref >60)
Glucose, Bld: 155 mg/dL — ABNORMAL HIGH (ref 70–99)
Potassium: 4.2 mmol/L (ref 3.5–5.1)
Sodium: 146 mmol/L — ABNORMAL HIGH (ref 135–145)
Total Bilirubin: 1.6 mg/dL — ABNORMAL HIGH (ref 0.3–1.2)
Total Protein: 7 g/dL (ref 6.5–8.1)

## 2021-11-01 LAB — LACTATE DEHYDROGENASE: LDH: 201 U/L — ABNORMAL HIGH (ref 98–192)

## 2021-11-01 NOTE — Progress Notes (Signed)
Hematology and Oncology Follow Up Visit  Laurie Green [id number] Oct 01, 1945 76 y.o. 11/01/2021   Principle Diagnosis:  1. Antiphospholipid antibody syndrome. 2. History of recurrent lower extremity deep venous thrombosis. 3. Nonalcoholic steatohepatitis.  Current Therapy:   Coumadin - 7.5 mg po q day -  to maintain an INR between 2-3. -- d/c due to lack of availability  Eliquis 5 mg po BID -- started on 11/21/2018     Interim History:  [name] is back for followup.  So far, she been doing pretty well.  She has had a nice summer so far.  She and her husband have not yet moved into [place].  She is not going to say when this will happen.  Her heart seems to be doing little bit better.  I do not think she has had problems with respect to palpitations.  She did have a fall.  I think she may have injured one of her ribs over on the right side.  She is worried that her liver may be damaged.  We did do liver function test on her.  Her liver tests all look normal.  She has had no problems with fever.  There is been no leg swelling.  She has had no bleeding on the Eliquis.  There is been no nausea or vomiting.  There is been no abdominal pain.  She has had no obvious change in bowel or bladder habits.  Overall, I would say performance status is probably ECOG 1.      Medications:  Current Outpatient Medications:    Acetaminophen (TYLENOL) 325 MG CAPS, Take 650 mg by mouth as needed., Disp: , Rfl:    benazepril (LOTENSIN) 20 MG tablet, Take 1 tablet (20 mg total) by mouth every morning., Disp: 30 tablet, Rfl: 1   ELIQUIS 5 MG TABS tablet, TAKE 1 TABLET BY MOUTH TWICE A DAY, Disp: 60 tablet, Rfl: 6   LANTUS SOLOSTAR 100 UNIT/ML Solostar Pen, Inject 24 Units into the skin at bedtime., Disp: 15 mL, Rfl: 1   Misc Natural Products (BLOOD SUGAR BALANCE) TABS, Take 1 tablet by mouth in the morning. Glucofort (blood sugar support formula ), Disp: , Rfl:    ondansetron (ZOFRAN-ODT)  4 MG disintegrating tablet, Take 4 mg by mouth 2 (two) times daily as needed., Disp: , Rfl:    PREMARIN vaginal cream, PLACE 1 APPLICATORFUL VAGINALLY 2 TIMES A WEEK. MONDAY AND WEDNESDAY OR THURSDAY. (Patient taking differently: Place 1 Applicatorful vaginally 2 (two) times a week. Mondays & Thursdays), Disp: 30 g, Rfl: 3   propranolol ER (INDERAL LA) 80 MG 24 hr capsule, Take 1 capsule (80 mg total) by mouth in the morning., Disp: 30 capsule, Rfl: 1  Allergies:  Allergies  Allergen Reactions   Hydrocodone Anaphylaxis and Nausea And Vomiting   Lidocaine Hcl Anaphylaxis   Piperacillin Sod-Tazobactam So Itching   Cyclosporine Other (See Comments)    burns burns burns   Hydromorphone Hcl Nausea And Vomiting    Can take with zofran   Morphine And Related Nausea And Vomiting   Percocet [Oxycodone-Acetaminophen] Nausea And Vomiting    Past Medical History, Surgical history, Social history, and Family History were reviewed and updated.  Review of Systems: Review of Systems  Constitutional:  Positive for malaise/fatigue.  HENT: Negative.    Eyes: Negative.   Respiratory: Negative.    Cardiovascular: Negative.   Gastrointestinal:  Positive for abdominal pain and diarrhea.  Genitourinary: Negative.   Musculoskeletal:  Positive for  myalgias.  Skin: Negative.   Neurological: Negative.   Endo/Heme/Allergies: Negative.   Psychiatric/Behavioral: Negative.       Physical Exam:  weight is 144 lb (65.3 kg). Her oral temperature is 98.1 F (36.7 C). Her blood pressure is 157/65 (abnormal) and her pulse is 59 (abnormal). Her respiration is 18 and oxygen saturation is 99%.   Vital signs show a temperature of 98.  Pulse 62.  Blood pressure 147/72.  Weight is 141 pounds.  Physical Exam Vitals reviewed.  HENT:     Head: Normocephalic and atraumatic.  Eyes:     Pupils: Pupils are equal, round, and reactive to light.  Cardiovascular:     Rate and Rhythm: Normal rate and regular rhythm.      Heart sounds: Normal heart sounds.  Pulmonary:     Effort: Pulmonary effort is normal.     Breath sounds: Normal breath sounds.  Abdominal:     General: Bowel sounds are normal.     Palpations: Abdomen is soft.    Musculoskeletal:        General: No tenderness or deformity.     Cervical back: Normal range of motion.  Lymphadenopathy:     Cervical: No cervical adenopathy.  Skin:    General: Skin is warm and dry.     Findings: No erythema or rash.  Neurological:     Mental Status: She is alert and oriented to person, place, and time.  Psychiatric:        Behavior: Behavior normal.        Thought Content: Thought content normal.        Judgment: Judgment normal.    Lab Results  Component Value Date   WBC 5.4 11/01/2021   HGB 14.7 11/01/2021   HCT 42.8 11/01/2021   MCV 99.1 11/01/2021   PLT 273 11/01/2021     Chemistry      Component Value Date/Time   NA 141 07/26/2021 0944   NA 144 04/04/2017 1139   NA 140 09/07/2016 0946   K 4.1 07/26/2021 0944   K 3.5 04/04/2017 1139   K 4.1 09/07/2016 0946   CL 104 07/26/2021 0944   CL 103 04/04/2017 1139   CO2 30 07/26/2021 0944   CO2 33 04/04/2017 1139   CO2 28 09/07/2016 0946   BUN 16 07/26/2021 0944   BUN 15 04/04/2017 1139   BUN 22.6 09/07/2016 0946   CREATININE 0.72 07/26/2021 0944   CREATININE 0.9 04/04/2017 1139   CREATININE 0.8 09/07/2016 0946      Component Value Date/Time   CALCIUM 10.0 07/26/2021 0944   CALCIUM 9.5 04/04/2017 1139   CALCIUM 9.6 09/07/2016 0946   ALKPHOS 77 07/26/2021 0944   ALKPHOS 93 (H) 04/04/2017 1139   ALKPHOS 81 09/07/2016 0946   AST 22 07/26/2021 0944   AST 32 09/07/2016 0946   ALT 19 07/26/2021 0944   ALT 36 04/04/2017 1139   ALT 33 09/07/2016 0946   BILITOT 1.0 07/26/2021 0944   BILITOT 1.34 (H) 09/07/2016 0946       Impression and Plan: Laurie Green is 76 year old white female with the anti-phospholipid antibody syndrome. This really has not been a problem for her. The  bigger issue has been the hepatic problems and her diabetes. She has steatosis. She has NASH.  So far, everything looks okay from my point of view.  I am just happy for her.  Hopefully, everything will be smooth when she finally does move into town.  We will  still plan for follow-up in 3 months.  I do not see that we have to do any scans or Dopplers.   [name], MD 7/18/202310:23 AM

## 2021-11-08 ENCOUNTER — Telehealth: Payer: Self-pay

## 2021-11-08 NOTE — Patient Outreach (Signed)
   Care Management   Outreach Note  11/08/2021 Name: Laurie Green MRN: [id number] DOB: Sep 11, 1945  An unsuccessful telephone outreach was attempted today. The patient was referred to the case management team for assistance with care management and care coordination.   Follow Up Plan:  The care management team will reach out to the patient again over the next 7 days.   Daneen Schick, BSW, CDP Social Worker, Certified Dementia Practitioner Care Coordination [phone]

## 2021-11-10 ENCOUNTER — Ambulatory Visit: Payer: Self-pay

## 2021-11-10 NOTE — Patient Outreach (Signed)
  Care Coordination   Initial Visit Note   11/10/2021 Name: Laurie Green MRN: [id number] DOB: 1945/09/01  Laurie Green is a 76 y.o. year old female who sees Maurice Small, MD for primary care. I spoke with  Laurie Green by phone today  What matters to the patients health and wellness today?  Better access to same day appointments   Goals Addressed               This Visit's Progress     Patient Stated     COMPLETED: I have trouble getting appointments (pt-stated)        Care Coordination Interventions: Discussed patient has difficulty obtaining same day office visits when needed and must utilize urgent care more than she would like Determined the patient is unsure if she wants to switch providers but has been thinking about it since her PCP has left the practice Provided the patient with the physician referral line advising if she chooses to switch practices she may call for assistance locating a new office         SDOH assessments and interventions completed:   Yes SDOH Interventions Today    Flowsheet Row Most Recent Value  SDOH Interventions   Food Insecurity Interventions Intervention Not Indicated  Housing Interventions Intervention Not Indicated  Transportation Interventions Intervention Not Indicated       Care Coordination Interventions Activated:  Yes Care Coordination Interventions:  Yes, provided  Follow up plan: No further intervention required.  Encounter Outcome:  Pt. Visit Completed  Daneen Schick, BSW, CDP Social Worker, Certified Dementia Practitioner Care Coordination [phone]

## 2021-11-10 NOTE — Patient Instructions (Signed)
Visit Information  Thank you for taking time to visit with me today. Please don't hesitate to contact me if I can be of assistance to you.   Following are the goals we discussed today:   Goals Addressed               This Visit's Progress     Patient Stated     COMPLETED: I have trouble getting appointments (pt-stated)        Care Coordination Interventions: Discussed patient has difficulty obtaining same day office visits when needed and must utilize urgent care more than she would like Determined the patient is unsure if she wants to switch providers but has been thinking about it since her PCP has left the practice Provided the patient with the physician referral line advising if she chooses to switch practices she may call for assistance locating a new office         Please call the care guide team at [phone] if you need to schedule an appointment with me  If you are experiencing a Mental Health or [place] or need someone to talk to, please call the [facility]: [phone]  Patient verbalizes understanding of instructions and care plan provided today and agrees to view in [place]. Active MyChart status and patient understanding of how to access instructions and care plan via MyChart confirmed with patient.     No further follow up required: Please contact me as needed.  Daneen Schick, BSW, CDP Social Worker, Certified Dementia Practitioner Care Coordination [phone]

## 2021-11-11 ENCOUNTER — Other Ambulatory Visit: Payer: Self-pay | Admitting: Gastroenterology

## 2021-11-11 DIAGNOSIS — K7460 Unspecified cirrhosis of liver: Secondary | ICD-10-CM

## 2021-11-11 DIAGNOSIS — R198 Other specified symptoms and signs involving the digestive system and abdomen: Secondary | ICD-10-CM | POA: Diagnosis not present

## 2021-11-11 DIAGNOSIS — R140 Abdominal distension (gaseous): Secondary | ICD-10-CM | POA: Diagnosis not present

## 2021-11-11 DIAGNOSIS — I864 Gastric varices: Secondary | ICD-10-CM | POA: Diagnosis not present

## 2021-11-11 DIAGNOSIS — R1013 Epigastric pain: Secondary | ICD-10-CM | POA: Diagnosis not present

## 2021-11-15 ENCOUNTER — Ambulatory Visit
Admission: RE | Admit: 2021-11-15 | Discharge: 2021-11-15 | Disposition: A | Discharge status: Home / Self Care | Payer: PPO | Source: Ambulatory Visit | Attending: Gastroenterology | Admitting: Gastroenterology

## 2021-11-15 DIAGNOSIS — K7460 Unspecified cirrhosis of liver: Secondary | ICD-10-CM | POA: Diagnosis not present

## 2021-11-23 ENCOUNTER — Other Ambulatory Visit: Payer: Self-pay

## 2021-11-23 ENCOUNTER — Telehealth: Payer: Self-pay

## 2021-11-23 DIAGNOSIS — Z95828 Presence of other vascular implants and grafts: Secondary | ICD-10-CM

## 2021-11-23 NOTE — Telephone Encounter (Signed)
-----   Message from Waynetta Sandy, MD sent at 11/23/2021 12:08 PM EDT ----- Regarding: RE: Please advise Ok to schedule  Laurie Green  ----- Message ----- From: Nicholas Lose, RN Sent: 11/22/2021   3:24 PM EDT To: Waynetta Sandy, MD Subject: Please advise                                  Dr. Donzetta Matters  I was following up with this patient because I noticed she never called to have her IVC filter removed as discussed on 11/12/20. She was concerned with the possible discomfort after procedure. I reminded her of your recommendations of removal from a right IJ approach and she is now agreeable. Do you need to see her again prior since over a year or is it ok to schedule?   Thanks,  [place]

## 2021-11-23 NOTE — Telephone Encounter (Signed)
Spoke with patient and informed of provider recommendations. Patient scheduled for IVC filter removal on 12/12/21 at [facility]. Instructions provided- patient verbalized understanding.

## 2021-11-30 DIAGNOSIS — N952 Postmenopausal atrophic vaginitis: Secondary | ICD-10-CM | POA: Diagnosis not present

## 2021-11-30 DIAGNOSIS — E1165 Type 2 diabetes mellitus with hyperglycemia: Secondary | ICD-10-CM | POA: Diagnosis not present

## 2021-11-30 DIAGNOSIS — Z794 Long term (current) use of insulin: Secondary | ICD-10-CM | POA: Diagnosis not present

## 2021-11-30 DIAGNOSIS — I10 Essential (primary) hypertension: Secondary | ICD-10-CM | POA: Diagnosis not present

## 2021-11-30 DIAGNOSIS — R52 Pain, unspecified: Secondary | ICD-10-CM | POA: Diagnosis not present

## 2021-11-30 DIAGNOSIS — R42 Dizziness and giddiness: Secondary | ICD-10-CM | POA: Diagnosis not present

## 2021-12-12 ENCOUNTER — Ambulatory Visit (HOSPITAL_COMMUNITY)
Admission: RE | Admit: 2021-12-12 | Discharge: 2021-12-12 | Disposition: A | Discharge status: Home / Self Care | Payer: PPO | Attending: Vascular Surgery | Admitting: Vascular Surgery

## 2021-12-12 ENCOUNTER — Encounter (HOSPITAL_COMMUNITY)
Admission: RE | Disposition: A | Discharge status: Home / Self Care | Payer: Self-pay | Source: Home / Self Care | Attending: Vascular Surgery

## 2021-12-12 ENCOUNTER — Encounter (HOSPITAL_COMMUNITY): Payer: Self-pay | Admitting: Vascular Surgery

## 2021-12-12 DIAGNOSIS — Z794 Long term (current) use of insulin: Secondary | ICD-10-CM | POA: Diagnosis not present

## 2021-12-12 DIAGNOSIS — E119 Type 2 diabetes mellitus without complications: Secondary | ICD-10-CM | POA: Insufficient documentation

## 2021-12-12 DIAGNOSIS — Z4589 Encounter for adjustment and management of other implanted devices: Secondary | ICD-10-CM | POA: Insufficient documentation

## 2021-12-12 DIAGNOSIS — Z7901 Long term (current) use of anticoagulants: Secondary | ICD-10-CM | POA: Diagnosis not present

## 2021-12-12 DIAGNOSIS — Z95828 Presence of other vascular implants and grafts: Secondary | ICD-10-CM

## 2021-12-12 DIAGNOSIS — F419 Anxiety disorder, unspecified: Secondary | ICD-10-CM | POA: Diagnosis not present

## 2021-12-12 DIAGNOSIS — Z86718 Personal history of other venous thrombosis and embolism: Secondary | ICD-10-CM | POA: Insufficient documentation

## 2021-12-12 DIAGNOSIS — M25561 Pain in right knee: Secondary | ICD-10-CM | POA: Diagnosis not present

## 2021-12-12 HISTORY — PX: IVC VENOGRAPHY: CATH118301

## 2021-12-12 HISTORY — PX: IVC FILTER REMOVAL: CATH118246

## 2021-12-12 LAB — POCT I-STAT, CHEM 8
BUN: 18 mg/dL (ref 8–23)
Calcium, Ion: 1.15 mmol/L (ref 1.15–1.40)
Chloride: 102 mmol/L (ref 98–111)
Creatinine, Ser: 0.7 mg/dL (ref 0.44–1.00)
Glucose, Bld: 237 mg/dL — ABNORMAL HIGH (ref 70–99)
HCT: 42 % (ref 36.0–46.0)
Hemoglobin: 14.3 g/dL (ref 12.0–15.0)
Potassium: 3.9 mmol/L (ref 3.5–5.1)
Sodium: 141 mmol/L (ref 135–145)
TCO2: 29 mmol/L (ref 22–32)

## 2021-12-12 LAB — GLUCOSE, CAPILLARY
Glucose-Capillary: 205 mg/dL — ABNORMAL HIGH (ref 70–99)
Glucose-Capillary: 232 mg/dL — ABNORMAL HIGH (ref 70–99)

## 2021-12-12 SURGERY — IVC FILTER REMOVAL
Anesthesia: LOCAL

## 2021-12-12 MED ORDER — FENTANYL CITRATE (PF) 100 MCG/2ML IJ SOLN
INTRAMUSCULAR | Status: DC | PRN
  Administered 2021-12-12 (×2): 50 ug via INTRAVENOUS

## 2021-12-12 MED ORDER — TETRACAINE HCL 1 % IJ SOLN
5.0000 mg | Freq: Once | INTRAMUSCULAR | Status: AC
  Administered 2021-12-12: 0.5 mL
  Filled 2021-12-12: qty 2

## 2021-12-12 MED ORDER — IODIXANOL 320 MG/ML IV SOLN
INTRAVENOUS | Status: DC | PRN
  Administered 2021-12-12: 10 mL

## 2021-12-12 MED ORDER — ONDANSETRON HCL 4 MG/2ML IJ SOLN: 4.0000 mg | Freq: Once | INTRAMUSCULAR | Status: AC

## 2021-12-12 MED ORDER — MIDAZOLAM HCL 2 MG/2ML IJ SOLN
INTRAMUSCULAR | Status: DC | PRN
  Administered 2021-12-12 (×2): 1 mg via INTRAVENOUS

## 2021-12-12 MED ORDER — ACETAMINOPHEN 500 MG PO TABS
ORAL_TABLET | ORAL | Status: AC
  Administered 2021-12-12: 1000 mg via ORAL
  Filled 2021-12-12: qty 2

## 2021-12-12 MED ORDER — HEPARIN (PORCINE) IN NACL 1000-0.9 UT/500ML-% IV SOLN
INTRAVENOUS | Status: AC
  Filled 2021-12-12: qty 500

## 2021-12-12 MED ORDER — HEPARIN (PORCINE) IN NACL 1000-0.9 UT/500ML-% IV SOLN
INTRAVENOUS | Status: DC | PRN
  Administered 2021-12-12: 500 mL

## 2021-12-12 MED ORDER — ONDANSETRON HCL 4 MG/2ML IJ SOLN
INTRAMUSCULAR | Status: AC
  Administered 2021-12-12: 4 mg via INTRAVENOUS
  Filled 2021-12-12: qty 2

## 2021-12-12 MED ORDER — MIDAZOLAM HCL 2 MG/2ML IJ SOLN
INTRAMUSCULAR | Status: AC
  Filled 2021-12-12: qty 2

## 2021-12-12 MED ORDER — FENTANYL CITRATE (PF) 100 MCG/2ML IJ SOLN
INTRAMUSCULAR | Status: AC
  Filled 2021-12-12: qty 2

## 2021-12-12 MED ORDER — ACETAMINOPHEN 500 MG PO TABS: 1000.0000 mg | ORAL_TABLET | Freq: Once | ORAL | Status: AC

## 2021-12-12 MED ORDER — LIDOCAINE HCL (PF) 1 % IJ SOLN
INTRAMUSCULAR | Status: AC
  Filled 2021-12-12: qty 30

## 2021-12-12 MED ORDER — SODIUM CHLORIDE 0.9 % IV SOLN: INTRAVENOUS | Status: DC

## 2021-12-12 SURGICAL SUPPLY — 15 items
CATH LAUNCHER 6FR HS (CATHETERS) IMPLANT
COVER DOME SNAP 22 D (MISCELLANEOUS) IMPLANT
DEVICE ONE SNARE 25MM (VASCULAR PRODUCTS) IMPLANT
DRYSEAL FLEXSHEATH 16FR 33CM (SHEATH) ×1
GUIDEWIRE ANGLED .035X260CM (WIRE) IMPLANT
KIT ESSENTIALS PG (KITS) IMPLANT
KIT MICROPUNCTURE NIT STIFF (SHEATH) IMPLANT
KIT PV (KITS) ×1 IMPLANT
PROTECTION STATION PRESSURIZED (MISCELLANEOUS) ×1
SET CLOVERSNARE FLT RETRIEVAL (MISCELLANEOUS) IMPLANT
SHEATH DRYSEAL FLEX 16FR 33CM (SHEATH) IMPLANT
SHEATH PROBE COVER 6X72 (BAG) IMPLANT
STATION PROTECTION PRESSURIZED (MISCELLANEOUS) IMPLANT
TRAY PV CATH (CUSTOM PROCEDURE TRAY) ×1 IMPLANT
WIRE BENTSON .035X145CM (WIRE) IMPLANT

## 2021-12-12 NOTE — Progress Notes (Signed)
Pt states nausea is better and ready to go home-d/c NAD via w/c with husband

## 2021-12-12 NOTE — H&P (Signed)
HPI   Laurie Green is a 76 y.o. female with history significant for multiple DVTs many years ago.  She did have a filter placed at [facility] over 20 years ago and states that it was never removed although there has been no evidence of the filter with our evaluation.  She recently underwent filter placement 3 months ago and subsequent total knee arthroplasty on the right.  Since that time she states that she has had significant pain in the right knee as well as anxiety.  She is back on her Eliquis and tolerating this well.  She is here today to discuss filter removal.       Past Medical History:  Diagnosis Date   Anginal pain ([place])     Anti-cardiolipin antibody syndrome (H[facility])      antiphospholipid antibody syndrome   Antiphospholipid antibody with hypercoagulable state ([place]) 11/07/2012   Arthritis     Asthma      well controlled, no rescue inhaler used   Blood dyscrasia      anticardiolipid syndrome   Blood transfusion      age 69 yr old-none since   Celiac disease     Diabetes mellitus      Type II   Diverticulitis     DVT (deep venous thrombosis) ([place]) 05/01/2011    "to liver"   Family history of adverse reaction to anesthesia      N&V   Fibromyalgia     GERD (gastroesophageal reflux disease)      11/01/16- not problem   H/O seasonal allergies     Heart murmur      "not a problem"   Hypertension     NASH (nonalcoholic steatohepatitis) 05/01/2011   Nausea & vomiting      11-17-14 "nausea and vomiting after meals" at present discussed with Dr. Oletta Lamas office per pt.   Pneumonia 2021   PONV (postoperative nausea and vomiting)      'I dont have it if they give me something in the IV."   Portal hypertension (HCC)     Stroke ([place])      tia- eye,   TIA (transient ischemic attack)     Varices 05/01/2011   Varices, esophageal (HCC)     Varices, gastric           Family History  Problem Relation Age of Onset   Cancer Mother     Alzheimer's disease Mother     Cancer Father      Heart defect Father           Past Surgical History:  Procedure Laterality Date   ABDOMINAL HYSTERECTOMY       ANKLE FRACTURE SURGERY Left      retained hardware   CHOLECYSTECTOMY       COLON SURGERY        hx. diverticulitis   COLONOSCOPY N/A 11/23/2014    Procedure: COLONOSCOPY;  Surgeon: [name], MD;  Location: [facility];  Service: Endoscopy;  Laterality: N/A;   ESOPHAGOGASTRODUODENOSCOPY   04/28/2011    Procedure: ESOPHAGOGASTRODUODENOSCOPY (EGD);  Surgeon: [name]., MD;  Location: [facility] ENDOSCOPY;  Service: Endoscopy;  Laterality: N/A;   ESOPHAGOGASTRODUODENOSCOPY N/A 11/23/2014    Procedure: ESOPHAGOGASTRODUODENOSCOPY (EGD);  Surgeon: [name], MD;  Location: [facility];  Service: Endoscopy;  Laterality: N/A;   ESOPHAGOGASTRODUODENOSCOPY (EGD) WITH PROPOFOL N/A 11/06/2016    Procedure: ESOPHAGOGASTRODUODENOSCOPY (EGD) WITH PROPOFOL;  Surgeon: [name], MD;  Location: [facility];  Service: Endoscopy;  Laterality: N/A;   EUS N/A 09/25/2012    Procedure: ESOPHAGEAL ENDOSCOPIC ULTRASOUND (EUS) RADIAL;  Surgeon: Arta Silence, MD;  Location: [facility];  Service: Endoscopy;  Laterality: N/A;   HERNIA REPAIR        luq incisional   IVC FILTER INSERTION N/A 07/19/2020    Procedure: IVC FILTER INSERTION;  Surgeon: Waynetta Sandy, MD;  Location: [facility];  Service: Cardiovascular;  Laterality: N/A;   SPLENECTOMY, TOTAL       TONSILLECTOMY       TOTAL KNEE ARTHROPLASTY Right 07/21/2020    Procedure: TOTAL KNEE ARTHROPLASTY;  Surgeon: Susa Day, MD;  Location: [facility];  Service: Orthopedics;  Laterality: Right;  2.5 hrs      Short Social History:  Social History         Tobacco Use   Smoking status: Never   Smokeless tobacco: Never   Tobacco comments:      never used tobacco  Substance Use Topics   Alcohol use: No      Alcohol/week: 0.0 standard drinks           Allergies  Allergen Reactions   Hydrocodone Anaphylaxis and  Nausea And Vomiting   Lidocaine Hcl Anaphylaxis   Piperacillin Sod-Tazobactam So Itching      "scalp itch"-was given Benadryl to counteract. "scalp itch"-was given Benadryl to counteract. "scalp itch"-was given Benadryl to counteract.   Cyclosporine Other (See Comments)      burns burns burns   Hydromorphone Hcl Nausea And Vomiting      Can take with zofran   Morphine And Related Nausea And Vomiting   Percocet [Oxycodone-Acetaminophen] Nausea And Vomiting            Current Outpatient Medications  Medication Sig Dispense Refill   benazepril (LOTENSIN) 20 MG tablet Take 20 mg by mouth every morning.       ELIQUIS 5 MG TABS tablet TAKE 1 TABLET BY MOUTH TWICE A DAY 60 tablet 6   famotidine (PEPCID) 20 MG tablet Take 1 tablet (20 mg total) by mouth 2 (two) times daily. 30 tablet 0   LANTUS SOLOSTAR 100 UNIT/ML Solostar Pen Inject 24 Units into the skin at bedtime.       Magnesium 250 MG TABS Take 250 mg by mouth daily with lunch.       Misc Natural Products (BLOOD SUGAR BALANCE) TABS Take 1 tablet by mouth in the morning. Glucofort (blood sugar support formula )       NON FORMULARY Take 80,000 Units by mouth daily with lunch. Serrapeptase       ondansetron (ZOFRAN ODT) 8 MG disintegrating tablet Take 1 tablet (8 mg total) by mouth every 8 (eight) hours as needed for nausea or vomiting. 20 tablet 0   PREMARIN vaginal cream PLACE 1 APPLICATORFUL VAGINALLY 2 TIMES A WEEK. MONDAY AND WEDNESDAY OR THURSDAY. (Patient taking differently: Place 1 Applicatorful vaginally 2 (two) times a week. Mondays & Thursdays) 30 g 3   propranolol ER (INDERAL LA) 80 MG 24 hr capsule Take 80 mg by mouth in the morning.       thiamine (VITAMIN B-1) 100 MG tablet Take 200 mg by mouth daily with lunch.       traMADol (ULTRAM) 50 MG tablet Take 1-2 tablets (50-100 mg total) by mouth every 6 (six) hours as needed (mild to moderate pain). 40 tablet 0   vitamin B-12 (CYANOCOBALAMIN) 1000 MCG tablet Take 1,000 mcg by  mouth 2 (  two) times a week.       vitamin C (ASCORBIC ACID) 500 MG tablet Take 500 mg by mouth 2 (two) times a week.       CALCIUM PO Take 1 tablet by mouth 2 (two) times a week. (Patient not taking: No sig reported)       cetirizine (ZYRTEC ALLERGY) 10 MG tablet Take 1 tablet (10 mg total) by mouth daily. (Patient not taking: Reported on 07/06/2020) 30 tablet 0   Cholecalciferol (VITAMIN D3) 2000 UNITS capsule Take 4,000 Units by mouth daily with lunch. (Patient not taking: Reported on 09/06/2020)       docusate sodium (COLACE) 100 MG capsule Take 1 capsule (100 mg total) by mouth 2 (two) times daily as needed for mild constipation. 30 capsule 1   glucose 4 GM chewable tablet Chew 16 g by mouth as needed for low blood sugar. (Patient not taking: No sig reported)       hydrOXYzine (ATARAX/VISTARIL) 25 MG tablet Take 1 tablet (25 mg total) by mouth every 6 (six) hours as needed for itching. (Patient not taking: No sig reported) 12 tablet 0   meclizine (ANTIVERT) 12.5 MG tablet Take 1 tablet (12.5 mg total) by mouth 3 (three) times daily as needed for dizziness. 30 tablet 0   NEOMYCIN-POLYMYXIN-HYDROCORTISONE (CORTISPORIN) 1 % SOLN OTIC solution Place 3 drops into the left ear 4 (four) times daily. (Patient not taking: No sig reported) 10 mL 2   niacin 500 MG tablet Take 500 mg by mouth daily with lunch. (Patient not taking: Reported on 09/06/2020)       ondansetron (ZOFRAN) 4 MG tablet Take 1 tablet (4 mg total) by mouth every 6 (six) hours as needed for nausea. (Patient not taking: Reported on 11/12/2020) 40 tablet 0   OVER THE COUNTER MEDICATION Take 28.3 g by mouth 2 (two) times a week. Carbon-60 (2 tablespoons twice weekly) (Patient not taking: No sig reported)       oxyCODONE (OXY IR/ROXICODONE) 5 MG immediate release tablet Take 1-2 tablets (5-10 mg total) by mouth every 4 (four) hours as needed for moderate pain or severe pain. 30 tablet 0   polyethylene glycol (MIRALAX / GLYCOLAX) 17 g packet Take  17 g by mouth daily. 14 each 0    No current facility-administered medications for this visit.      Review of Systems  Constitutional: Positive for fatigue.  HENT: HENT negative.  Eyes: Eyes negative.  Cardiovascular: Positive for irregular heartbeat.  GI: Gastrointestinal negative.  Musculoskeletal: Musculoskeletal negative.  Skin: Skin negative.  Neurological: Neurological negative. Hematologic: Hematologic/lymphatic negative.  Psychiatric: Positive for sleep disturbance.       Anxiety        Objective:    Vitals:   12/12/21 0655 12/12/21 0720  BP: (!) 171/90   Pulse: 62   Resp: 18   Temp:  97.8 F (36.6 C)  SpO2: 98%       Physical Exam HENT:     Head: Normocephalic.     Nose:     Comments: Wearing a mask Eyes:     Pupils: Pupils are equal, round, and reactive to light.  Cardiovascular:     Rate and Rhythm: Normal rate.     Pulses: Normal pulses.  Pulmonary:     Effort: Pulmonary effort is normal.  Abdominal:     General: Abdomen is flat.     Palpations: Abdomen is soft.  Musculoskeletal:        General: Normal  range of motion.     Comments: Well-healed right knee incision  Skin:    General: Skin is warm.  Neurological:     General: No focal deficit present.     Mental Status: She is alert. Mental status is at baseline.  Psychiatric:        Mood and Affect: Mood normal.        Thought Content: Thought content normal.        Judgment: Judgment normal.           Assessment/Plan:     76 year old female with history of multiple DVTs recently had an IVC filter placed for total knee arthroplasty.  We have discussed removing her filter but at this time patient has significant anxiety and ongoing pain from her recent procedure and she is hopeful to delay.  I discussed with the patient calling to schedule filter removal from a right IJ approach when she is ready and we will need to hold Eliquis for 48 hours prior.  She demonstrates good understanding  states that she will call in the very near future to have the filter removed.         Waynetta Sandy MD [facility]

## 2021-12-12 NOTE — Progress Notes (Signed)
Pt c/o pain to right side of neck 8/10 and nausea-Dr Donzetta Matters notified-orders for tylenol and zofran

## 2021-12-12 NOTE — Op Note (Signed)
    Patient name: Laurie Green MRN: [id number] DOB: 04/17/1946 Sex: female  12/12/2021 Pre-operative Diagnosis: History of antiphospholipid antibody IVC filter placement Post-operative diagnosis:  Same Surgeon:  Eda Paschal. Donzetta Matters, MD Procedure Performed: 1.  Ultrasound-guided cannulation right internal jugular vein 2.  IVC filter retrieval 3.  Moderate sedation with fentanyl and Versed for 34 minutes 4.  IVC venogram   Indications: 76 year old female has a history of multiple DVTs with antiphospholipid antibody syndrome now status post IVC filter placement for total knee arthroplasty.  She is now indicated for filter removal.  Findings: Filter hook was embedded in the IVC sidewall on the left.  We were able to snare wire around the cephalad aspect of the filter and remove it through a 16 French sheath and at completion the IVC was intact and the filter was fully intact on further examination.   Procedure:  The patient was identified in the holding area and taken to room 8.  The patient was then placed supine on the table and prepped and draped in the usual sterile fashion.  A time out was called.  Ultrasound was used to evaluate the right internal jugular vein which was noted to be patent and compressible.  The area was anesthetized with tetracaine given her lidocaine allergy.  The right IJ was cannulated with micropuncture to followed by wire and sheath.  A Bentson wire was placed into the IVC and the retrieval sheath was placed under fluoroscopic guidance.  I attempted to snare the filter but I could not.  Patient was given moderate sedation fentanyl and Versed and her vital signs were monitored throughout the case.  I ultimately elected to place the Bentson wire placed a long 16 French sheath.  Through the 16 French sheath I then placed a wire just around the cephalad aspect of the filter and snared this.  I was then able to advance the 16 French sheath over the filter without any increased  amount of tension.  The filter was retrieved fully intact.  I then performed venogram and the IVC appeared intact without any extravasation and it was patent.  Satisfied with this the sheath was removed and pressure held.  She tolerated procedure without any complication.   Contrast: 10cc  Trampus Mcquerry C. Donzetta Matters, MD [facility] [place] Office: [phone] Pager: [phone]

## 2022-01-17 ENCOUNTER — Inpatient Hospital Stay: Payer: PPO | Admitting: Hematology & Oncology

## 2022-01-17 ENCOUNTER — Encounter: Payer: Self-pay | Admitting: Hematology & Oncology

## 2022-01-17 ENCOUNTER — Inpatient Hospital Stay: Payer: PPO | Attending: Hematology & Oncology

## 2022-01-17 ENCOUNTER — Other Ambulatory Visit: Payer: Self-pay

## 2022-01-17 VITALS — BP 169/69 | HR 59 | Temp 98.4°F | Resp 20 | Ht 63.0 in | Wt 146.0 lb

## 2022-01-17 DIAGNOSIS — D6861 Antiphospholipid syndrome: Secondary | ICD-10-CM | POA: Diagnosis not present

## 2022-01-17 DIAGNOSIS — Z86718 Personal history of other venous thrombosis and embolism: Secondary | ICD-10-CM | POA: Insufficient documentation

## 2022-01-17 DIAGNOSIS — Z79899 Other long term (current) drug therapy: Secondary | ICD-10-CM | POA: Diagnosis not present

## 2022-01-17 DIAGNOSIS — R42 Dizziness and giddiness: Secondary | ICD-10-CM | POA: Diagnosis not present

## 2022-01-17 DIAGNOSIS — K7581 Nonalcoholic steatohepatitis (NASH): Secondary | ICD-10-CM | POA: Diagnosis not present

## 2022-01-17 DIAGNOSIS — Z7901 Long term (current) use of anticoagulants: Secondary | ICD-10-CM | POA: Insufficient documentation

## 2022-01-17 DIAGNOSIS — H8112 Benign paroxysmal vertigo, left ear: Secondary | ICD-10-CM

## 2022-01-17 LAB — CBC WITH DIFFERENTIAL (CANCER CENTER ONLY)
Abs Immature Granulocytes: 0.01 K/uL (ref 0.00–0.07)
Basophils Absolute: 0.1 K/uL (ref 0.0–0.1)
Basophils Relative: 2 %
Eosinophils Absolute: 0.3 K/uL (ref 0.0–0.5)
Eosinophils Relative: 5 %
HCT: 40.6 % (ref 36.0–46.0)
Hemoglobin: 14 g/dL (ref 12.0–15.0)
Immature Granulocytes: 0 %
Lymphocytes Relative: 30 %
Lymphs Abs: 1.7 K/uL (ref 0.7–4.0)
MCH: 33.5 pg (ref 26.0–34.0)
MCHC: 34.5 g/dL (ref 30.0–36.0)
MCV: 97.1 fL (ref 80.0–100.0)
Monocytes Absolute: 0.8 K/uL (ref 0.1–1.0)
Monocytes Relative: 13 %
Neutro Abs: 2.9 K/uL (ref 1.7–7.7)
Neutrophils Relative %: 50 %
Platelet Count: 320 K/uL (ref 150–400)
RBC: 4.18 MIL/uL (ref 3.87–5.11)
RDW: 12.7 % (ref 11.5–15.5)
WBC Count: 5.7 K/uL (ref 4.0–10.5)
nRBC: 0 % (ref 0.0–0.2)

## 2022-01-17 LAB — CMP (CANCER CENTER ONLY)
ALT: 19 U/L (ref 0–44)
AST: 21 U/L (ref 15–41)
Albumin: 4 g/dL (ref 3.5–5.0)
Alkaline Phosphatase: 83 U/L (ref 38–126)
Anion gap: 6 (ref 5–15)
BUN: 17 mg/dL (ref 8–23)
CO2: 32 mmol/L (ref 22–32)
Calcium: 9.8 mg/dL (ref 8.9–10.3)
Chloride: 103 mmol/L (ref 98–111)
Creatinine: 0.78 mg/dL (ref 0.44–1.00)
GFR, Estimated: >60 mL/min (ref >60)
Glucose, Bld: 186 mg/dL — ABNORMAL HIGH (ref 70–99)
Potassium: 4.1 mmol/L (ref 3.5–5.1)
Sodium: 141 mmol/L (ref 135–145)
Total Bilirubin: 1.1 mg/dL (ref 0.3–1.2)
Total Protein: 6.9 g/dL (ref 6.5–8.1)

## 2022-01-17 LAB — LACTATE DEHYDROGENASE: LDH: 188 U/L (ref 98–192)

## 2022-01-17 NOTE — Progress Notes (Signed)
Hematology and Oncology Follow Up Visit  Laurie Green [id number] [birth year] 76 y.o. 01/17/2022   Principle Diagnosis:  1. Antiphospholipid antibody syndrome. 2. History of recurrent lower extremity deep venous thrombosis. 3. Nonalcoholic steatohepatitis.  Current Therapy:   Coumadin - 7.5 mg po q day -  to maintain an INR between 2-3. -- d/c due to lack of availability  Eliquis 5 mg po BID -- started on 11/21/2018     Interim History:  Laurie Green is back for followup.  She is still worried about palpitations.  She says that she gets these often.  She needs to see a cardiologist.  We will see about making referral to cardiology.  She also is having vertigo.  She says she has not fallen a couple times.  Thankfully, she has not broken anything.  Thankfully, she has had no bleeding.  She is on Eliquis.  She has seen ENT in the past.  Is been a long time since she has seen ENT.  This will take another referral.  She has had no abdominal pain.  There has been no nausea or vomiting.  She has had no cough.  She has had no leg swelling or pain.  She has had no fever.  Currently, I would say performance status is probably ECOG 1.       Medications:  Current Outpatient Medications:    Acetaminophen (TYLENOL PO), Take 1,000 mg by mouth daily as needed (pain)., Disp: , Rfl:    benazepril (LOTENSIN) 40 MG tablet, Take 40 mg by mouth daily., Disp: , Rfl:    Carboxymethylcellulose Sodium (DRY EYE RELIEF OP), Place 1 drop into both eyes daily as needed (Dry eye)., Disp: , Rfl:    ELIQUIS 5 MG TABS tablet, TAKE 1 TABLET BY MOUTH TWICE A DAY, Disp: 60 tablet, Rfl: 6   LANTUS SOLOSTAR 100 UNIT/ML Solostar Pen, Inject 24 Units into the skin at bedtime. (Patient taking differently: Inject into the skin at bedtime. 01/17/2022 Takes 10-12 Units in the morning and 10-12 Units at hs.), Disp: 15 mL, Rfl: 1   meclizine (ANTIVERT) 25 MG tablet, Take 12.5-25 mg by mouth 3 (three) times daily as needed for  dizziness., Disp: , Rfl:    pantoprazole (PROTONIX) 40 MG tablet, Take 40 mg by mouth daily., Disp: , Rfl:    PREMARIN vaginal cream, PLACE 1 APPLICATORFUL VAGINALLY 2 TIMES A WEEK. MONDAY AND WEDNESDAY OR THURSDAY. (Patient taking differently: Place 1 Applicatorful vaginally 2 (two) times a week. Mondays & Thursdays 0.5 mg), Disp: 30 g, Rfl: 3   propranolol ER (INDERAL LA) 80 MG 24 hr capsule, Take 1 capsule (80 mg total) by mouth in the morning., Disp: 30 capsule, Rfl: 1   Misc Natural Products (BLOOD SUGAR BALANCE) TABS, Take 1 tablet by mouth in the morning. Glucofort (blood sugar support formula ) (Patient not taking: Reported on 01/17/2022), Disp: , Rfl:    oxymetazoline (AFRIN) 0.05 % nasal spray, Place 1 spray into both nostrils 2 (two) times daily as needed for congestion. (Patient not taking: Reported on 01/17/2022), Disp: , Rfl:   Allergies:  Allergies  Allergen Reactions   Lidocaine Hcl Anaphylaxis   Piperacillin Sod-Tazobactam So Itching   Cyclosporine Other (See Comments)    burns   Hydromorphone Hcl Nausea And Vomiting    Can take with zofran   Morphine And Related Nausea And Vomiting   Percocet [Oxycodone-Acetaminophen] Nausea And Vomiting    Past Medical History, Surgical history, Social history, and Family  History were reviewed and updated.  Review of Systems: Review of Systems  Constitutional:  Positive for malaise/fatigue.  HENT: Negative.    Eyes: Negative.   Respiratory: Negative.    Cardiovascular: Negative.   Gastrointestinal:  Positive for abdominal pain and diarrhea.  Genitourinary: Negative.   Musculoskeletal:  Positive for myalgias.  Skin: Negative.   Neurological: Negative.   Endo/Heme/Allergies: Negative.   Psychiatric/Behavioral: Negative.       Physical Exam:  height is 5' 3"  (1.6 m) and weight is 146 lb (66.2 kg). Her oral temperature is 98.4 F (36.9 C). Her blood pressure is 169/69 (abnormal) and her pulse is 59 (abnormal). Her respiration is  20 and oxygen saturation is 98%.   Vital signs show a temperature of 98.  Pulse 62.  Blood pressure 147/72.  Weight is 141 pounds.  Physical Exam Vitals reviewed.  HENT:     Head: Normocephalic and atraumatic.  Eyes:     Pupils: Pupils are equal, round, and reactive to light.  Cardiovascular:     Rate and Rhythm: Normal rate and regular rhythm.     Heart sounds: Normal heart sounds.  Pulmonary:     Effort: Pulmonary effort is normal.     Breath sounds: Normal breath sounds.  Abdominal:     General: Bowel sounds are normal.     Palpations: Abdomen is soft.    Musculoskeletal:        General: No tenderness or deformity.     Cervical back: Normal range of motion.  Lymphadenopathy:     Cervical: No cervical adenopathy.  Skin:    General: Skin is warm and dry.     Findings: No erythema or rash.  Neurological:     Mental Status: She is alert and oriented to person, place, and time.  Psychiatric:        Behavior: Behavior normal.        Thought Content: Thought content normal.        Judgment: Judgment normal.     Lab Results  Component Value Date   WBC 5.7 01/17/2022   HGB 14.0 01/17/2022   HCT 40.6 01/17/2022   MCV 97.1 01/17/2022   PLT 320 01/17/2022     Chemistry      Component Value Date/Time   NA 141 01/17/2022 1001   NA 144 04/04/2017 1139   NA 140 09/07/2016 0946   K 4.1 01/17/2022 1001   K 3.5 04/04/2017 1139   K 4.1 09/07/2016 0946   CL 103 01/17/2022 1001   CL 103 04/04/2017 1139   CO2 32 01/17/2022 1001   CO2 33 04/04/2017 1139   CO2 28 09/07/2016 0946   BUN 17 01/17/2022 1001   BUN 15 04/04/2017 1139   BUN 22.6 09/07/2016 0946   CREATININE 0.78 01/17/2022 1001   CREATININE 0.9 04/04/2017 1139   CREATININE 0.8 09/07/2016 0946      Component Value Date/Time   CALCIUM 9.8 01/17/2022 1001   CALCIUM 9.5 04/04/2017 1139   CALCIUM 9.6 09/07/2016 0946   ALKPHOS 83 01/17/2022 1001   ALKPHOS 93 (H) 04/04/2017 1139   ALKPHOS 81 09/07/2016 0946    AST 21 01/17/2022 1001   AST 32 09/07/2016 0946   ALT 19 01/17/2022 1001   ALT 36 04/04/2017 1139   ALT 33 09/07/2016 0946   BILITOT 1.1 01/17/2022 1001   BILITOT 1.34 (H) 09/07/2016 0946       Impression and Plan: Laurie Green is 76 year old white female with the anti-phospholipid  antibody syndrome. This really has not been a problem for her. The bigger issue has been the hepatic problems and her diabetes. She has steatosis. She has NASH.  We will have to see about her heart.  We will have to see about the vertigo.  I would like to see her back before Thanksgiving.  I want to make sure that everything is doing okay with her before the [place] season.   [name], MD 10/3/202311:30 AM

## 2022-01-30 ENCOUNTER — Encounter (HOSPITAL_COMMUNITY): Payer: Self-pay | Admitting: Gastroenterology

## 2022-01-31 NOTE — Progress Notes (Signed)
Attempted to obtain medical history via telephone, unable to reach at this time. HIPAA compliant voicemail message left requesting return call to pre surgical testing department. 

## 2022-02-02 ENCOUNTER — Encounter (HOSPITAL_COMMUNITY): Payer: Self-pay | Admitting: Physician Assistant

## 2022-02-02 NOTE — Progress Notes (Signed)
Eugenie Filler Bowel Prep reminder: n/a npo after midnight  For Anesthesia: PCP - [facility] Cardiologist -Hasnt been yet but has appt with MD Nahser 10/30   Chest x-ray - 09/26/20 EKG - 01/19/21 Stress Test -  n/a ECHO - 2021 Cardiac Cath -  n/a Pacemaker/ICD device last checked: n/a Sleep Study - n/a CPAP -     Blood Thinner Instructions: n/a Instructions:   Last Dose:      Anesthesia review: HX of murmur, stroke, asthma, NASH, recurrent lower extremity DVTs, Antiphospholipid antibody syndrome. States went the the ED last year about palpitations, and they did a workup and they told her it was anxiety related. She has had a few incidents of palpitations since and told her Hematologist about this and they sent referral for cardiology. Her first appt is on 02/13/22.

## 2022-02-07 ENCOUNTER — Encounter (HOSPITAL_COMMUNITY): Admission: RE | Payer: Self-pay | Source: Home / Self Care

## 2022-02-07 ENCOUNTER — Ambulatory Visit (HOSPITAL_COMMUNITY): Admission: RE | Admit: 2022-02-07 | Payer: PPO | Source: Home / Self Care | Admitting: Gastroenterology

## 2022-02-07 SURGERY — ESOPHAGOGASTRODUODENOSCOPY (EGD) WITH PROPOFOL
Anesthesia: Monitor Anesthesia Care

## 2022-02-10 ENCOUNTER — Other Ambulatory Visit: Payer: Self-pay | Admitting: *Deleted

## 2022-02-10 ENCOUNTER — Other Ambulatory Visit: Payer: Self-pay | Admitting: Hematology & Oncology

## 2022-02-10 MED ORDER — APIXABAN 5 MG PO TABS: 5.0000 mg | ORAL_TABLET | Freq: Two times a day (BID) | ORAL | 6 refills | Status: DC

## 2022-02-12 NOTE — Progress Notes (Unsigned)
Cardiology Office Note:    Date:  02/13/2022   ID:  [name], DOB 12/10/1945, MRN [id number]  PCP:  [facility], [place] Prov[facility] Cardiologist: Sariya Trickey    Referring MD: [name], MD   Chief Complaint  Patient presents with   Palpitations     History of Present Illness:    Laurie Green is a 76 y.o. female with a hx of anticardiolipin antibody / hypercoagulable syndrome (sees Dr. Marin Olp) , DM, murmur  She also has palpitations, has had some vertigo like symptoms    She has a hx of multiple DVTs, Had an IVC filter placed  She recently had removal of her IVC filter and was started back on Eliquis   Palpitations for the past year Has had 4 episodes  HR seemed to be irregular  Episode lasted 4 hours Has had some that were only 10 min Associated with chest heaviness  No regular exercise ( still recovering from knee surgery 19 months ago )   No CP or dyspnea with normal daily activities  Has lots of anxiety    Past Medical History:  Diagnosis Date   Anginal pain ([place])    Anti-cardiolipin antibody syndrome ([place])    antiphospholipid antibody syndrome   Antiphospholipid antibody with hypercoagulable state ([place]) 11/07/2012   Arthritis    Asthma    well controlled, no rescue inhaler used   Blood dyscrasia    anticardiolipid syndrome   Blood transfusion    age 15 yr old-none since   Celiac disease    Diabetes mellitus    Type II   Diverticulitis    DVT (deep venous thrombosis) ([place]) 05/01/2011   "to liver"   Family history of adverse reaction to anesthesia    N&V   Fibromyalgia    GERD (gastroesophageal reflux disease)    11/01/16- not problem   H/O seasonal allergies    Heart murmur    "not a problem"   Hypertension    NASH (nonalcoholic steatohepatitis) 05/01/2011   Nausea & vomiting    11-17-14 "nausea and vomiting after meals" at present discussed with Dr. Oletta Lamas office per pt.   Pneumonia 2021   PONV  (postoperative nausea and vomiting)    'I dont have it if they give me something in the IV."   Portal hypertension ([place])    Stroke ([place])    tia- eye,   TIA (transient ischemic attack)    Varices 05/01/2011   Varices, esophageal (H[facility])    Varices, gastric     Past Surgical History:  Procedure Laterality Date   ABDOMINAL HYSTERECTOMY     ANKLE FRACTURE SURGERY Left    retained hardware   CHOLECYSTECTOMY     COLON SURGERY     hx. diverticulitis   COLONOSCOPY N/A 11/23/2014   Procedure: COLONOSCOPY;  Surgeon: [name], MD;  Location: [facility] ENDOSCOPY;  Service: Endoscopy;  Laterality: N/A;   ESOPHAGOGASTRODUODENOSCOPY  04/28/2011   Procedure: ESOPHAGOGASTRODUODENOSCOPY (EGD);  Surgeon: [name]., MD;  Location: [facility] ENDOSCOPY;  Service: Endoscopy;  Laterality: N/A;   ESOPHAGOGASTRODUODENOSCOPY N/A 11/23/2014   Procedure: ESOPHAGOGASTRODUODENOSCOPY (EGD);  Surgeon: [name], MD;  Location: [facility] ENDOSCOPY;  Service: Endoscopy;  Laterality: N/A;   ESOPHAGOGASTRODUODENOSCOPY (EGD) WITH PROPOFOL N/A 11/06/2016   Procedure: ESOPHAGOGASTRODUODENOSCOPY (EGD) WITH PROPOFOL;  Surgeon: [name], MD;  Location: [facility];  Service: Endoscopy;  Laterality: N/A;   EUS N/A 09/25/2012   Procedure: ESOPHAGEAL ENDOSCOPIC ULTRASOUND (EUS) RADIAL;  Surgeon: Arta Silence, MD;  Location: [facility] ENDOSCOPY;  Service: Endoscopy;  Laterality: N/A;   HERNIA REPAIR     luq incisional   IVC FILTER INSERTION N/A 07/19/2020   Procedure: IVC FILTER INSERTION;  Surgeon: Waynetta Sandy, MD;  Location: [facility] CV LAB;  Service: Cardiovascular;  Laterality: N/A;   IVC FILTER REMOVAL N/A 12/12/2021   Procedure: IVC FILTER REMOVAL;  Surgeon: Waynetta Sandy, MD;  Location: [place] CV LAB;  Service: Cardiovascular;  Laterality: N/A;   IVC VENOGRAPHY N/A 12/12/2021   Procedure: IVC Venography;  Surgeon: Waynetta Sandy, MD;  Location: [facility] [facility] LAB;  Service: Cardiovascular;   Laterality: N/A;   SPLENECTOMY, TOTAL     TONSILLECTOMY     TOTAL KNEE ARTHROPLASTY Right 07/21/2020   Procedure: TOTAL KNEE ARTHROPLASTY;  Surgeon: Susa Day, MD;  Location: [facility] ORS;  Service: Orthopedics;  Laterality: Right;  2.5 hrs    Current Medications: Current Meds  Medication Sig   Acetaminophen (TYLENOL PO) Take 1,000 mg by mouth daily as needed (pain).   apixaban (ELIQUIS) 5 MG TABS tablet Take 1 tablet (5 mg total) by mouth 2 (two) times daily.   benazepril (LOTENSIN) 40 MG tablet Take 40 mg by mouth daily.   Carboxymethylcellulose Sodium (DRY EYE RELIEF OP) Place 1 drop into both eyes daily as needed (Dry eye).   LANTUS SOLOSTAR 100 UNIT/ML Solostar Pen Inject 24 Units into the skin at bedtime. (Patient taking differently: Inject into the skin at bedtime. 01/17/2022 Takes 10-12 Units in the morning and 10-12 Units at hs.)   meclizine (ANTIVERT) 25 MG tablet Take 12.5-25 mg by mouth 3 (three) times daily as needed for dizziness.   Misc Natural Products (BLOOD SUGAR BALANCE) TABS Take 1 tablet by mouth in the morning. Glucofort (blood sugar support formula )   pantoprazole (PROTONIX) 40 MG tablet Take 40 mg by mouth daily.   PREMARIN vaginal cream PLACE 1 APPLICATORFUL VAGINALLY 2 TIMES A WEEK. MONDAY AND WEDNESDAY OR THURSDAY. (Patient taking differently: Place 1 Applicatorful vaginally 2 (two) times a week. Mondays & Thursdays 0.5 mg)   propranolol (INDERAL) 10 MG tablet Take 1 tablet (10 mg total) by mouth 4 (four) times daily. As needed   propranolol ER (INDERAL LA) 80 MG 24 hr capsule Take 1 capsule (80 mg total) by mouth in the morning.     Allergies:   Lidocaine hcl, Piperacillin sod-tazobactam so, Cyclosporine, Hydromorphone hcl, Morphine and related, and Percocet [oxycodone-acetaminophen]   Social History   Socioeconomic History   Marital status: Married    Spouse name: Not on file   Number of children: Not on file   Years of education: Not on file   Highest  education level: Not on file  Occupational History   Not on file  Tobacco Use   Smoking status: Never   Smokeless tobacco: Never   Tobacco comments:    never used tobacco  Vaping Use   Vaping Use: Never used  Substance and Sexual Activity   Alcohol use: No    Alcohol/week: 0.0 standard drinks of alcohol   Drug use: No   Sexual activity: Yes  Other Topics Concern   Not on file  Social History Narrative   Not on file   Social Determinants of Health   Financial Resource Strain: Not on file  Food Insecurity: No Food Insecurity (11/10/2021)   Hunger Vital Sign    Worried About Running Out of Food in the Last Year: Never true  Ran Out of Food in the Last Year: Never true  Transportation Needs: No Transportation Needs (11/10/2021)   PRAPARE - Hydrologist (Medical): No    Lack of Transportation (Non-Medical): No  Physical Activity: Not on file  Stress: Not on file  Social Connections: Not on file     Family History: The patient's family history includes Alzheimer's disease in her mother; Cancer in her father and mother; Heart defect in her father.  ROS:   Please see the history of present illness.     All other systems reviewed and are negative.  EKGs/Labs/Other Studies Reviewed:    The following studies were reviewed today:   EKG:   February 13, 2022: Normal sinus rhythm at 61.  No ST or T wave changes.  Recent Labs: 01/17/2022: ALT 19; BUN 17; Creatinine 0.78; Hemoglobin 14.0; Platelet Count 320; Potassium 4.1; Sodium 141  Recent Lipid Panel No results found for: "CHOL", "TRIG", "HDL", "CHOLHDL", "VLDL", "LDLCALC", "LDLDIRECT"   Risk Assessment/Calculations:                Physical Exam:    VS:  BP 128/88   Pulse 63   Ht 5' 3.5" (1.613 m)   Wt 146 lb (66.2 kg)   SpO2 95%   BMI 25.46 kg/m     Wt Readings from Last 3 Encounters:  02/13/22 146 lb (66.2 kg)  01/17/22 146 lb (66.2 kg)  12/12/21 139 lb (63 kg)     GEN:   Well nourished, well developed in no acute distress HEENT: Normal NECK: No JVD; No carotid bruits LYMPHATICS: No lymphadenopathy CARDIAC: RRR,  soft systolic murmur  RESPIRATORY:  Clear to auscultation without rales, wheezing or rhonchi  ABDOMEN: Soft, non-tender, non-distended MUSCULOSKELETAL:  No edema; No deformity  SKIN: Warm and dry NEUROLOGIC:  Alert and oriented x 3 PSYCHIATRIC:  Normal affect   ASSESSMENT:    1. Palpitations    PLAN:       Palpitations:     Continue inderal LA.  Will add propranolol 10 mg PRN palpitations for added suppression of her PVCs. Will get an event monitor     2.  HTN:   watch salt ,   medication adjustments next visit              Medication Adjustments/Labs and Tests Ordered: Current medicines are reviewed at length with the patient today.  Concerns regarding medicines are outlined above.  Orders Placed This Encounter  Procedures   CARDIAC EVENT MONITOR   Meds ordered this encounter  Medications   propranolol (INDERAL) 10 MG tablet    Sig: Take 1 tablet (10 mg total) by mouth 4 (four) times daily. As needed    Dispense:  120 tablet    Refill:  6    Patient Instructions  Medication Instructions:  START Propranolol IR 55m up to four times daily as needed *If you need a refill on your cardiac medications before your next appointment, please call your pharmacy*   Lab Work: NONE If you have labs (blood work) drawn today and your tests are completely normal, you will receive your results only by: [place](if you have MyChart) OR A paper copy in the mail If you have any lab test that is abnormal or we need to change your treatment, we will call you to review the results.   Testing/Procedures: 30-day cardiac monitor Your physician has recommended that you wear an event monitor. Event monitors are medical  devices that record the heart's electrical activity. Doctors most often [place] these monitors to diagnose  arrhythmias. Arrhythmias are problems with the speed or rhythm of the heartbeat. The monitor is a small, portable device. You can wear one while you do your normal daily activities. This is usually used to diagnose what is causing palpitations/syncope (passing out).  Follow-Up: At [facility], you and your health needs are our priority.  As part of our continuing mission to provide you with exceptional heart care, we have created designated Provider Care Teams.  These Care Teams include your primary Cardiologist (physician) and Advanced Practice Providers (APPs -  Physician Assistants and Nurse Practitioners) who all work together to provide you with the care you need, when you need it.  Your next appointment:   2 month(s)  The format for your next appointment:   In Person  Provider:   Mertie Moores, MD     Important Information About Sugar         Signed, Mertie Moores, MD  02/13/2022 5:51 PM    [place]

## 2022-02-13 ENCOUNTER — Encounter: Payer: Self-pay | Admitting: Cardiovascular Disease

## 2022-02-13 ENCOUNTER — Ambulatory Visit: Payer: PPO | Attending: Cardiovascular Disease | Admitting: Cardiovascular Disease

## 2022-02-13 VITALS — BP 128/88 | HR 63 | Ht 63.5 in | Wt 146.0 lb

## 2022-02-13 DIAGNOSIS — R002 Palpitations: Secondary | ICD-10-CM | POA: Diagnosis not present

## 2022-02-13 MED ORDER — PROPRANOLOL HCL 10 MG PO TABS: 10.0000 mg | ORAL_TABLET | Freq: Four times a day (QID) | ORAL | 6 refills | Status: DC

## 2022-02-13 NOTE — Patient Instructions (Signed)
Medication Instructions:  START Propranolol IR 71m up to four times daily as needed *If you need a refill on your cardiac medications before your next appointment, please call your pharmacy*   Lab Work: NONE If you have labs (blood work) drawn today and your tests are completely normal, you will receive your results only by: [place](if you have MyChart) OR A paper copy in the mail If you have any lab test that is abnormal or we need to change your treatment, we will call you to review the results.   Testing/Procedures: 30-day cardiac monitor Your physician has recommended that you wear an event monitor. Event monitors are medical devices that record the heart's electrical activity. Doctors most often [place] monitors to diagnose arrhythmias. Arrhythmias are problems with the speed or rhythm of the heartbeat. The monitor is a small, portable device. You can wear one while you do your normal daily activities. This is usually used to diagnose what is causing palpitations/syncope (passing out).  Follow-Up: [facility] and your [facility] needs are our [facility].  As part of our continuing mission to provide you with exceptional heart care, we have created designated Provider Care Teams.  These Care Teams include your primary Cardiologist (physician) and Advanced Practice Providers (APPs -  Physician Assistants and Nurse Practitioners) who all work together to provide you with the care you need, when you need it.  Your next appointment:   2 month(s)  The format for your next appointment:   In Person  Provider:   PMertie Moores MD     Important Information About Sugar

## 2022-02-19 ENCOUNTER — Ambulatory Visit: Payer: PPO | Attending: Cardiovascular Disease

## 2022-02-19 DIAGNOSIS — R002 Palpitations: Secondary | ICD-10-CM | POA: Diagnosis not present

## 2022-02-20 ENCOUNTER — Telehealth: Payer: Self-pay

## 2022-02-20 NOTE — Telephone Encounter (Signed)
....     Pre-operative Risk Assessment    Patient Name: Laurie Green  DOB: 1945-10-31 MRN: [id number]      Request for Surgical Clearance    Procedure:   ENDOSCOPY  Date of Surgery:  Clearance TBD                                 Surgeon:  DR [name] Surgeon's Group or Practice Name:  [facility] Phone number:  [phone] Fax number:  [phone]   Type of Clearance Requested:   - Medical  - Pharmacy:  Hold Apixaban (Eliquis)     Type of Anesthesia:   PROPOFOL   Additional requests/questions:    Gwenlyn Found   02/20/2022, 10:06 AM

## 2022-02-21 NOTE — Telephone Encounter (Addendum)
Patient with diagnosis of multiple DVTs on Eliquis 5 mg PO BID for anticoagulation.    Procedure: colonoscopy Date of procedure: 03/01/2022    CrCl 57.83 ml/min (Cr 0.78; 01/17/2022) Platelet count 320 (01/17/2022)  Per office protocol, patient can hold Eliquis for 1 day prior to procedure due to history of multiple DVTs and antiphospholipid syndrome.  **This guidance is not considered finalized until pre-operative APP has relayed final recommendations.**   Sandford Craze, PharmD. [facility] Acute Care PGY-1 02/21/2022 4:54 PM

## 2022-02-21 NOTE — Telephone Encounter (Signed)
[facility] called to update surgery date is 03/01/22

## 2022-02-22 ENCOUNTER — Other Ambulatory Visit: Payer: Self-pay | Admitting: Gastroenterology

## 2022-02-22 NOTE — Telephone Encounter (Signed)
     Primary Cardiologist: Mertie Moores, MD  Chart reviewed as part of pre-operative protocol coverage. Given past medical history and time since last visit, based on ACC/AHA guidelines, FRANCELY CRAW would be at acceptable risk for the planned procedure without further cardiovascular testing.   Patient was advised that if she develops new symptoms prior to surgery to contact our office to arrange a follow-up appointment.  He verbalized understanding.  Patient with diagnosis of multiple DVTs on Eliquis 5 mg PO BID for anticoagulation.     Procedure: colonoscopy Date of procedure: 03/01/2022     CrCl 57.83 ml/min (Cr 0.78; 01/17/2022) Platelet count 320 (01/17/2022)   Per office protocol, patient can hold Eliquis for 1 day prior to procedure due to history of multiple DVTs and antiphospholipid syndrome.  I will route this recommendation to the requesting party via Epic fax function and remove from pre-op pool.  Please call with questions.  Jossie Ng. Harla Mensch NP-C     02/22/2022, 11:48 AM [place] [address] Office [phone] Fax [phone]

## 2022-03-01 ENCOUNTER — Inpatient Hospital Stay: Payer: PPO | Attending: Hematology & Oncology

## 2022-03-01 ENCOUNTER — Encounter (HOSPITAL_COMMUNITY): Admission: RE | Payer: Self-pay | Source: Ambulatory Visit

## 2022-03-01 ENCOUNTER — Ambulatory Visit (HOSPITAL_COMMUNITY): Admission: RE | Admit: 2022-03-01 | Payer: PPO | Source: Ambulatory Visit | Admitting: Gastroenterology

## 2022-03-01 ENCOUNTER — Inpatient Hospital Stay: Payer: PPO | Admitting: Hematology & Oncology

## 2022-03-01 ENCOUNTER — Encounter: Payer: Self-pay | Admitting: Hematology & Oncology

## 2022-03-01 VITALS — Ht 63.0 in | Wt 146.0 lb

## 2022-03-01 DIAGNOSIS — E119 Type 2 diabetes mellitus without complications: Secondary | ICD-10-CM | POA: Insufficient documentation

## 2022-03-01 DIAGNOSIS — Z79899 Other long term (current) drug therapy: Secondary | ICD-10-CM | POA: Diagnosis not present

## 2022-03-01 DIAGNOSIS — Z794 Long term (current) use of insulin: Secondary | ICD-10-CM | POA: Insufficient documentation

## 2022-03-01 DIAGNOSIS — K7581 Nonalcoholic steatohepatitis (NASH): Secondary | ICD-10-CM | POA: Insufficient documentation

## 2022-03-01 DIAGNOSIS — Z7901 Long term (current) use of anticoagulants: Secondary | ICD-10-CM | POA: Diagnosis not present

## 2022-03-01 DIAGNOSIS — D6861 Antiphospholipid syndrome: Secondary | ICD-10-CM

## 2022-03-01 DIAGNOSIS — Z86718 Personal history of other venous thrombosis and embolism: Secondary | ICD-10-CM | POA: Insufficient documentation

## 2022-03-01 LAB — CMP (CANCER CENTER ONLY)
ALT: 13 U/L (ref 0–44)
AST: 18 U/L (ref 15–41)
Albumin: 4 g/dL (ref 3.5–5.0)
Alkaline Phosphatase: 81 U/L (ref 38–126)
Anion gap: 5 (ref 5–15)
BUN: 20 mg/dL (ref 8–23)
CO2: 33 mmol/L — ABNORMAL HIGH (ref 22–32)
Calcium: 10 mg/dL (ref 8.9–10.3)
Chloride: 107 mmol/L (ref 98–111)
Creatinine: 0.84 mg/dL (ref 0.44–1.00)
GFR, Estimated: >60 mL/min (ref >60)
Glucose, Bld: 178 mg/dL — ABNORMAL HIGH (ref 70–99)
Potassium: 4.6 mmol/L (ref 3.5–5.1)
Sodium: 145 mmol/L (ref 135–145)
Total Bilirubin: 1.1 mg/dL (ref 0.3–1.2)
Total Protein: 6.9 g/dL (ref 6.5–8.1)

## 2022-03-01 LAB — CBC WITH DIFFERENTIAL (CANCER CENTER ONLY)
Abs Immature Granulocytes: 0.01 10*3/uL (ref 0.00–0.07)
Basophils Absolute: 0.1 10*3/uL (ref 0.0–0.1)
Basophils Relative: 2 %
Eosinophils Absolute: 0.3 10*3/uL (ref 0.0–0.5)
Eosinophils Relative: 5 %
HCT: 41.3 % (ref 36.0–46.0)
Hemoglobin: 13.9 g/dL (ref 12.0–15.0)
Immature Granulocytes: 0 %
Lymphocytes Relative: 32 %
Lymphs Abs: 1.7 10*3/uL (ref 0.7–4.0)
MCH: 33.2 pg (ref 26.0–34.0)
MCHC: 33.7 g/dL (ref 30.0–36.0)
MCV: 98.6 fL (ref 80.0–100.0)
Monocytes Absolute: 0.7 10*3/uL (ref 0.1–1.0)
Monocytes Relative: 13 %
Neutro Abs: 2.5 10*3/uL (ref 1.7–7.7)
Neutrophils Relative %: 48 %
Platelet Count: 313 10*3/uL (ref 150–400)
RBC: 4.19 MIL/uL (ref 3.87–5.11)
RDW: 12.9 % (ref 11.5–15.5)
WBC Count: 5.2 10*3/uL (ref 4.0–10.5)
nRBC: 0 % (ref 0.0–0.2)

## 2022-03-01 LAB — LACTATE DEHYDROGENASE: LDH: 190 U/L (ref 98–192)

## 2022-03-01 SURGERY — ESOPHAGOGASTRODUODENOSCOPY (EGD) WITH PROPOFOL
Anesthesia: Monitor Anesthesia Care | Laterality: Bilateral

## 2022-03-01 NOTE — Progress Notes (Signed)
Hematology and Oncology Follow Up Visit  Laurie Green [id number] 09/01/1945 76 y.o. 03/01/2022   Principle Diagnosis:  1. Antiphospholipid antibody syndrome. 2. History of recurrent lower extremity deep venous thrombosis. 3. Nonalcoholic steatohepatitis.  Current Therapy:   Coumadin - 7.5 mg po q day -  to maintain an INR between 2-3. -- d/c due to lack of availability  Eliquis 5 mg po BID -- started on 11/21/2018     Interim History:  Ms.  Green is back for followup.  She did see cardiology.  She does have a portable Holter monitor on her now.  She has wear this for 2 months.  Since she has had this on, she is not felt any palpitations.  It seems like an older sister may have lung cancer.  She talk to me about this.  I gave her my recommendations.  It sounds like she is getting very good [place].  Laurie Green has had no problems with Eliquis.  She says a lot of problems with her right leg.  She had knee surgery which did not go all that well for her.  She been having a lot of pain in the right lower leg.  She has had no fever.  She has had no bleeding.  There is no change in bowel or bladder habits.  She does have diabetes.  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  Currently, I would have to say that her performance status is probably ECOG 1.        Medications:  Current Outpatient Medications:    Acetaminophen (TYLENOL PO), Take 1,000 mg by mouth daily as needed (pain)., Disp: , Rfl:    apixaban (ELIQUIS) 5 MG TABS tablet, Take 1 tablet (5 mg total) by mouth 2 (two) times daily., Disp: 60 tablet, Rfl: 6   benazepril (LOTENSIN) 40 MG tablet, Take 40 mg by mouth daily., Disp: , Rfl:    LANTUS SOLOSTAR 100 UNIT/ML Solostar Pen, Inject 24 Units into the skin at bedtime. (Patient taking differently: Inject into the skin at bedtime. 03/01/2022 Takes 10-12 Units in the morning and 10-12 Units at hs.), Disp: 15 mL, Rfl: 1   Misc Natural  Products (BLOOD SUGAR BALANCE) TABS, Take 1 tablet by mouth in the morning. Glucofort (blood sugar support formula ), Disp: , Rfl:    pantoprazole (PROTONIX) 40 MG tablet, Take 40 mg by mouth daily., Disp: , Rfl:    PREMARIN vaginal cream, PLACE 1 APPLICATORFUL VAGINALLY 2 TIMES A WEEK. MONDAY AND WEDNESDAY OR THURSDAY. (Patient taking differently: Place 1 Applicatorful vaginally 2 (two) times a week. Mondays & Thursdays 0.5 mg), Disp: 30 g, Rfl: 3   propranolol ER (INDERAL LA) 80 MG 24 hr capsule, Take 1 capsule (80 mg total) by mouth in the morning., Disp: 30 capsule, Rfl: 1   Carboxymethylcellulose Sodium (DRY EYE RELIEF OP), Place 1 drop into both eyes daily as needed (Dry eye). (Patient not taking: Reported on 03/01/2022), Disp: , Rfl:    meclizine (ANTIVERT) 25 MG tablet, Take 12.5-25 mg by mouth 3 (three) times daily as needed for dizziness. (Patient not taking: Reported on 03/01/2022), Disp: , Rfl:    oxymetazoline (AFRIN) 0.05 % nasal spray, Place 1 spray into both nostrils 2 (two) times daily as needed for congestion. (Patient not taking: Reported on 01/17/2022), Disp: , Rfl:    propranolol (INDERAL) 10 MG tablet, Take 1 tablet (10 mg total) by mouth 4 (four) times  daily. As needed (Patient not taking: Reported on 03/01/2022), Disp: 120 tablet, Rfl: 6  Allergies:  Allergies  Allergen Reactions   Lidocaine Hcl Anaphylaxis   Piperacillin Sod-Tazobactam So Itching   Cyclosporine Other (See Comments)    burns   Hydromorphone Hcl Nausea And Vomiting    Can take with zofran   Morphine And Related Nausea And Vomiting   Percocet [Oxycodone-Acetaminophen] Nausea And Vomiting    Past Medical History, Surgical history, Social history, and Family History were reviewed and updated.  Review of Systems: Review of Systems  Constitutional:  Positive for malaise/fatigue.  HENT: Negative.    Eyes: Negative.   Respiratory: Negative.    Cardiovascular: Negative.   Gastrointestinal:  Positive for  abdominal pain and diarrhea.  Genitourinary: Negative.   Musculoskeletal:  Positive for myalgias.  Skin: Negative.   Neurological: Negative.   Endo/Heme/Allergies: Negative.   Psychiatric/Behavioral: Negative.       Physical Exam:  height is 5' 3"  (1.6 m) and weight is 146 lb (66.2 kg).   Vital signs show a temperature of 98.  Pulse 62.  Blood pressure 147/72.  Weight is 141 pounds.  Physical Exam Vitals reviewed.  HENT:     Head: Normocephalic and atraumatic.  Eyes:     Pupils: Pupils are equal, round, and reactive to light.  Cardiovascular:     Rate and Rhythm: Normal rate and regular rhythm.     Heart sounds: Normal heart sounds.  Pulmonary:     Effort: Pulmonary effort is normal.     Breath sounds: Normal breath sounds.  Abdominal:     General: Bowel sounds are normal.     Palpations: Abdomen is soft.    Musculoskeletal:        General: No tenderness or deformity.     Cervical back: Normal range of motion.  Lymphadenopathy:     Cervical: No cervical adenopathy.  Skin:    General: Skin is warm and dry.     Findings: No erythema or rash.  Neurological:     Mental Status: She is alert and oriented to person, place, and time.  Psychiatric:        Behavior: Behavior normal.        Thought Content: Thought content normal.        Judgment: Judgment normal.     Lab Results  Component Value Date   WBC 5.2 03/01/2022   HGB 13.9 03/01/2022   HCT 41.3 03/01/2022   MCV 98.6 03/01/2022   PLT 313 03/01/2022     Chemistry      Component Value Date/Time   NA 145 03/01/2022 1025   NA 144 04/04/2017 1139   NA 140 09/07/2016 0946   K 4.6 03/01/2022 1025   K 3.5 04/04/2017 1139   K 4.1 09/07/2016 0946   CL 107 03/01/2022 1025   CL 103 04/04/2017 1139   CO2 33 (H) 03/01/2022 1025   CO2 33 04/04/2017 1139   CO2 28 09/07/2016 0946   BUN 20 03/01/2022 1025   BUN 15 04/04/2017 1139   BUN 22.6 09/07/2016 0946   CREATININE 0.84 03/01/2022 1025   CREATININE 0.9  04/04/2017 1139   CREATININE 0.8 09/07/2016 0946      Component Value Date/Time   CALCIUM 10.0 03/01/2022 1025   CALCIUM 9.5 04/04/2017 1139   CALCIUM 9.6 09/07/2016 0946   ALKPHOS 81 03/01/2022 1025   ALKPHOS 93 (H) 04/04/2017 1139   ALKPHOS 81 09/07/2016 0946   AST 18 03/01/2022 1025  AST 32 09/07/2016 0946   ALT 13 03/01/2022 1025   ALT 36 04/04/2017 1139   ALT 33 09/07/2016 0946   BILITOT 1.1 03/01/2022 1025   BILITOT 1.34 (H) 09/07/2016 0946       Impression and Plan: Laurie Green is 76 year old white female with the anti-phospholipid antibody syndrome. This really has not been a problem for her. The bigger issue has been the hepatic problems and her diabetes. She has steatosis. She has NASH.  I cannot imagine that this could be a problem with her heart.  Again, cardiology will follow-up on this.  Hopefully, she will not have any issues with the vertigo.  I know that she will have a wonderful holiday season with her family.  We will pray for her sister.  I gave Laurie Green a prayer blanket to give to her sister.  We will plan to see her back in another couple months.    [name], MD 11/15/202311:47 AM

## 2022-03-29 ENCOUNTER — Other Ambulatory Visit: Payer: Self-pay | Admitting: Gastroenterology

## 2022-04-25 ENCOUNTER — Encounter: Payer: Self-pay | Admitting: Cardiovascular Disease

## 2022-04-25 NOTE — Progress Notes (Unsigned)
Cardiology Office Note:    Date:  04/25/2022   ID:  Laurie Green, DOB [birth year], MRN [id number]  PCP:  Saintclair Halsted, [place] Providers Cardiologist: Dezmon Conover    Referring MD: Saintclair Halsted, FNP   Chief Complaint  Patient presents with   Palpitations     History of Present Illness:    Laurie Green is a 77 y.o. female with a hx of anticardiolipin antibody / hypercoagulable syndrome (sees Dr. Marin Olp) , DM, murmur  She also has palpitations, has had some vertigo like symptoms    She has a hx of multiple DVTs, Had an IVC filter placed  She recently had removal of her IVC filter and was started back on Eliquis   Palpitations for the past year Has had 4 episodes  HR seemed to be irregular  Episode lasted 4 hours Has had some that were only 10 min Associated with chest heaviness  No regular exercise ( still recovering from knee surgery 19 months ago )   No CP or dyspnea with normal daily activities  Has lots of anxiety    Jan. 10. 2024 Laurie Green is seen for follow up of her palpitations Hypercoagulable syndrome , hx of multiple DVTs , PEs In on Eliquis  Previously had an IVC filter      Past Medical History:  Diagnosis Date   Anginal pain ([place])    Anti-cardiolipin antibody syndrome ([place])    antiphospholipid antibody syndrome   Antiphospholipid antibody with hypercoagulable state ([place]) 11/07/2012   Arthritis    Asthma    well controlled, no rescue inhaler used   Blood dyscrasia    anticardiolipid syndrome   Blood transfusion    age 62 yr old-none since   Celiac disease    Diabetes mellitus    Type II   Diverticulitis    DVT (deep venous thrombosis) ([place]) 05/01/2011   "to liver"   Family history of adverse reaction to anesthesia    N&V   Fibromyalgia    GERD (gastroesophageal reflux disease)    11/01/16- not problem   H/O seasonal allergies    Heart murmur    "not a problem"   Hypertension    NASH (nonalcoholic  steatohepatitis) 05/01/2011   Nausea & vomiting    11-17-14 "nausea and vomiting after meals" at present discussed with Dr. Oletta Lamas office per pt.   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 diverticulitis   COLONOSCOPY N/A 11/23/2014   Procedure: COLONOSCOPY;  Surgeon: [name], MD;  Location: [facility] ENDOSCOPY;  Service: Endoscopy;  Laterality: N/A;   ESOPHAGOGASTRODUODENOSCOPY  04/28/2011   Procedure: ESOPHAGOGASTRODUODENOSCOPY (EGD);  Surgeon: [name]., MD;  Location: [facility] ENDOSCOPY;  Service: Endoscopy;  Laterality: N/A;   ESOPHAGOGASTRODUODENOSCOPY N/A 11/23/2014   Procedure: ESOPHAGOGASTRODUODENOSCOPY (EGD);  Surgeon: [name], MD;  Location: [facility] ENDOSCOPY;  Service: Endoscopy;  Laterality: N/A;   ESOPHAGOGASTRODUODENOSCOPY (EGD) WITH PROPOFOL N/A 11/06/2016  Procedure: ESOPHAGOGASTRODUODENOSCOPY (EGD) WITH PROPOFOL;  Surgeon: [name], MD;  Location: [place];  Service: Endoscopy;  Laterality: N/A;   EUS N/A 09/25/2012   Procedure: ESOPHAGEAL ENDOSCOPIC ULTRASOUND (EUS) RADIAL;  Surgeon: Arta Silence, MD;  Location: [facility] ENDOSCOPY;  Service: Endoscopy;  Laterality: N/A;   HERNIA REPAIR     luq incisional   IVC FILTER INSERTION N/A 07/19/2020   Procedure: IVC FILTER INSERTION;  Surgeon: Waynetta Sandy, MD;  Location: [place] LAB;  Service: Cardiovascular;  Laterality: N/A;   IVC FILTER REMOVAL N/A 12/12/2021   Procedure: IVC FILTER REMOVAL;  Surgeon: Waynetta Sandy, MD;  Location: [place] [facility];  Service: Cardiovascular;   Laterality: N/A;   IVC VENOGRAPHY N/A 12/12/2021   Procedure: IVC Venography;  Surgeon: Waynetta Sandy, MD;  Location: [place] [facility];  Service: Cardiovascular;  Laterality: N/A;   SPLENECTOMY, TOTAL     TONSILLECTOMY     TOTAL KNEE ARTHROPLASTY Right 07/21/2020   Procedure: TOTAL KNEE ARTHROPLASTY;  Surgeon: Susa Day, MD;  Location: [facility];  Service: Orthopedics;  Laterality: Right;  2.5 hrs    Current Medications: No outpatient medications have been marked as taking for the 04/26/22 encounter (Office Visit) with Rozalynn Buege, Wonda Cheng, MD.     Allergies:   Lidocaine hcl, Piperacillin sod-tazobactam so, Cyclosporine, Hydromorphone hcl, Morphine and related, and Percocet [oxycodone-acetaminophen]   Social History   Socioeconomic History   Marital status: Married    Spouse name: Not on file   Number of children: Not on file   Years of education: Not on file   Highest education level: Not on file  Occupational History   Not on file  Tobacco Use   Smoking status: Never   Smokeless tobacco: Never   Tobacco comments:    never used tobacco  Vaping Use   Vaping Use: Never used  Substance and Sexual Activity   Alcohol use: No    Alcohol/week: 0.0 standard drinks of alcohol   Drug use: No   Sexual activity: Yes  Other Topics Concern   Not on file  Social History Narrative   Not on file   Social Determinants of Health   Financial Resource Strain: Not on file  Food Insecurity: No Food Insecurity (11/10/2021)   Hunger Vital Sign    Worried About Running Out of Food in the Last Year: Never true    Ran Out of Food in the Last Year: Never true  Transportation Needs: No Transportation Needs (11/10/2021)   PRAPARE - Hydrologist (Medical): No    Lack of Transportation (Non-Medical): No  Physical Activity: Not on file  Stress: Not on file  Social Connections: Not on file     Family History: The patient's family history includes Alzheimer's  disease in her mother; Cancer in her father and mother; Heart defect in her father.  ROS:   Please see the history of present illness.     All other systems reviewed and are negative.  EKGs/Labs/Other Studies Reviewed:    The following studies were reviewed today:   EKG:     Recent Labs: 03/01/2022: ALT 13; BUN 20; Creatinine 0.84; Hemoglobin 13.9; Platelet Count 313; Potassium 4.6; Sodium 145  Recent Lipid Panel No results found for: "CHOL", "TRIG", "HDL", "CHOLHDL", "VLDL", "LDLCALC", "LDLDIRECT"   Risk Assessment/Calculations:      No BP recorded.  {Refresh Note OR Click here to enter BP  :1}***  Physical Exam:    Physical Exam: There were no vitals taken for this visit.  No BP recorded.  {Refresh Note OR Click here to enter BP  :1}***    GEN:  Well nourished, well developed in no acute distress HEENT: Normal NECK: No JVD; No carotid bruits LYMPHATICS: No lymphadenopathy CARDIAC: RRR ***, no murmurs, rubs, gallops RESPIRATORY:  Clear to auscultation without rales, wheezing or rhonchi  ABDOMEN: Soft, non-tender, non-distended MUSCULOSKELETAL:  No edema; No deformity  SKIN: Warm and dry NEUROLOGIC:  Alert and oriented x 3   ASSESSMENT:    No diagnosis found.  PLAN:       Palpitations:            2.  HTN:                Medication Adjustments/Labs and Tests Ordered: Current medicines are reviewed at length with the patient today.  Concerns regarding medicines are outlined above.  No orders of the defined types were placed in this encounter.  No orders of the defined types were placed in this encounter.   There are no Patient Instructions on file for this visit.   Signed, Mertie Moores, MD  04/25/2022 6:40 AM    [place]

## 2022-04-26 ENCOUNTER — Encounter: Payer: Self-pay | Admitting: Cardiovascular Disease

## 2022-04-26 ENCOUNTER — Ambulatory Visit: Payer: PPO | Attending: Cardiovascular Disease | Admitting: Cardiovascular Disease

## 2022-04-26 VITALS — BP 138/86 | HR 70 | Ht 63.5 in | Wt 142.6 lb

## 2022-04-26 DIAGNOSIS — I82401 Acute embolism and thrombosis of unspecified deep veins of right lower extremity: Secondary | ICD-10-CM

## 2022-04-26 DIAGNOSIS — D6861 Antiphospholipid syndrome: Secondary | ICD-10-CM

## 2022-04-26 DIAGNOSIS — R002 Palpitations: Secondary | ICD-10-CM

## 2022-04-26 NOTE — Patient Instructions (Signed)
Medication Instructions:  Your physician recommends that you continue on your current medications as directed. Please refer to the Current Medication list given to you today.  *If you need a refill on your cardiac medications before your next appointment, please call your pharmacy*   Lab Work: NONE If you have labs (blood work) drawn today and your tests are completely normal, you will receive your results only by: MyChart Message (if you have MyChart) OR A paper copy in the mail If you have any lab test that is abnormal or we need to change your treatment, we will call you to review the results.   Testing/Procedures: NONE   Follow-Up: At [facility], you and your health needs are our priority.  As part of our continuing mission to provide you with exceptional heart care, we have created designated Provider Care Teams.  These Care Teams include your primary Cardiologist (physician) and Advanced Practice Providers (APPs -  Physician Assistants and Nurse Practitioners) who all work together to provide you with the care you need, when you need it.  We recommend signing up for the patient portal called "MyChart".  Sign up information is provided on this After Visit Summary.  MyChart is used to connect with patients for Virtual Visits (Telemedicine).  Patients are able to view lab/test results, encounter notes, upcoming appointments, etc.  Non-urgent messages can be sent to your provider as well.   To learn more about what you can do with MyChart, go to [email or web address].    Your next appointment:   1 year(s)  The format for your next appointment:   In Person  Provider:   Philip Nahser, MD       Important Information About Sugar       

## 2022-04-27 ENCOUNTER — Encounter (HOSPITAL_COMMUNITY): Payer: Self-pay | Admitting: Gastroenterology

## 2022-05-02 ENCOUNTER — Inpatient Hospital Stay (HOSPITAL_BASED_OUTPATIENT_CLINIC_OR_DEPARTMENT_OTHER): Payer: PPO | Admitting: Medical Oncology

## 2022-05-02 ENCOUNTER — Encounter: Payer: Self-pay | Admitting: Medical Oncology

## 2022-05-02 ENCOUNTER — Inpatient Hospital Stay: Payer: PPO | Attending: Hematology & Oncology

## 2022-05-02 VITALS — BP 164/83 | HR 65 | Temp 97.5°F | Resp 18 | Wt 143.0 lb

## 2022-05-02 DIAGNOSIS — E0800 Diabetes mellitus due to underlying condition with hyperosmolarity without nonketotic hyperglycemic-hyperosmolar coma (NKHHC): Secondary | ICD-10-CM

## 2022-05-02 DIAGNOSIS — I82402 Acute embolism and thrombosis of unspecified deep veins of left lower extremity: Secondary | ICD-10-CM

## 2022-05-02 DIAGNOSIS — I8500 Esophageal varices without bleeding: Secondary | ICD-10-CM

## 2022-05-02 DIAGNOSIS — D6861 Antiphospholipid syndrome: Secondary | ICD-10-CM

## 2022-05-02 DIAGNOSIS — Z86718 Personal history of other venous thrombosis and embolism: Secondary | ICD-10-CM | POA: Diagnosis not present

## 2022-05-02 DIAGNOSIS — E119 Type 2 diabetes mellitus without complications: Secondary | ICD-10-CM | POA: Insufficient documentation

## 2022-05-02 DIAGNOSIS — Z794 Long term (current) use of insulin: Secondary | ICD-10-CM

## 2022-05-02 DIAGNOSIS — Z79899 Other long term (current) drug therapy: Secondary | ICD-10-CM | POA: Diagnosis not present

## 2022-05-02 DIAGNOSIS — K7581 Nonalcoholic steatohepatitis (NASH): Secondary | ICD-10-CM

## 2022-05-02 DIAGNOSIS — Z7901 Long term (current) use of anticoagulants: Secondary | ICD-10-CM | POA: Diagnosis not present

## 2022-05-02 LAB — CMP (CANCER CENTER ONLY)
ALT: 14 U/L (ref 0–44)
AST: 19 U/L (ref 15–41)
Albumin: 4.1 g/dL (ref 3.5–5.0)
Alkaline Phosphatase: 78 U/L (ref 38–126)
Anion gap: 7 (ref 5–15)
BUN: 17 mg/dL (ref 8–23)
CO2: 31 mmol/L (ref 22–32)
Calcium: 9.9 mg/dL (ref 8.9–10.3)
Chloride: 106 mmol/L (ref 98–111)
Creatinine: 0.78 mg/dL (ref 0.44–1.00)
GFR, Estimated: >60 mL/min (ref >60)
Glucose, Bld: 173 mg/dL — ABNORMAL HIGH (ref 70–99)
Potassium: 4.3 mmol/L (ref 3.5–5.1)
Sodium: 144 mmol/L (ref 135–145)
Total Bilirubin: 1.2 mg/dL (ref 0.3–1.2)
Total Protein: 7.1 g/dL (ref 6.5–8.1)

## 2022-05-02 LAB — CBC WITH DIFFERENTIAL (CANCER CENTER ONLY)
Abs Immature Granulocytes: 0.01 10*3/uL (ref 0.00–0.07)
Basophils Absolute: 0.1 10*3/uL (ref 0.0–0.1)
Basophils Relative: 1 %
Eosinophils Absolute: 0.4 10*3/uL (ref 0.0–0.5)
Eosinophils Relative: 7 %
HCT: 41.7 % (ref 36.0–46.0)
Hemoglobin: 14.1 g/dL (ref 12.0–15.0)
Immature Granulocytes: 0 %
Lymphocytes Relative: 34 %
Lymphs Abs: 1.9 10*3/uL (ref 0.7–4.0)
MCH: 32.8 pg (ref 26.0–34.0)
MCHC: 33.8 g/dL (ref 30.0–36.0)
MCV: 97 fL (ref 80.0–100.0)
Monocytes Absolute: 0.8 10*3/uL (ref 0.1–1.0)
Monocytes Relative: 14 %
Neutro Abs: 2.4 10*3/uL (ref 1.7–7.7)
Neutrophils Relative %: 44 %
Platelet Count: 314 10*3/uL (ref 150–400)
RBC: 4.3 MIL/uL (ref 3.87–5.11)
RDW: 13.3 % (ref 11.5–15.5)
WBC Count: 5.6 10*3/uL (ref 4.0–10.5)
nRBC: 0 % (ref 0.0–0.2)

## 2022-05-02 NOTE — Progress Notes (Signed)
Hematology and Oncology Follow Up Visit  Laurie Green [id number] May 09, 1945 77 y.o. 05/02/2022   Principle Diagnosis:  1. Antiphospholipid antibody syndrome. 2. History of recurrent lower extremity deep venous thrombosis. 3. Nonalcoholic steatohepatitis.  Current Therapy:   Coumadin - 7.5 mg po q day -  to maintain an INR between 2-3. -- d/c due to lack of availability  Eliquis 5 mg po BID -- started on 11/21/2018     Interim History:  Ms.  Criscuolo is back for followup.    She is followed by cardiology- recent 2 month holter monitor was normal! They suspect her palpitations, occasional tachycardia and occasional elevated BP due to her chronic knee pain. She reports that she is planning on getting a second opinion regarding her chronic right knee pain that worsened after her right knee replacement.   Continues to do ok with the Eliquis. No bleeding.  There is no change in bowel or bladder habits. No SOB, chest pain, calf pain or leg swelling.   She does have diabetes and is followed by her PCP. She reports that she had an episodes of mild hypoglycemia this morning and she over corrected a bit so this is why her glucose is a bit higher.   Has also has NASH for which she continues to avoid tylenol but 1-2 times per week and does not drink ETOH. She is followed by GI.   Regarding her sister who was suspected to have lung cancer. She continues to lose weight but recent follow up imaging showed resolution of one nodule and 41m growth of the other. They are hopeful that her nodules may be from recent COVID-19 infection and not from a malignant process.   Currently, I would have to say that her performance status is probably ECOG 1.       Wt Readings from Last 3 Encounters:  05/02/22 143 lb (64.9 kg)  04/26/22 142 lb 9.6 oz (64.7 kg)  03/01/22 146 lb (66.2 kg)    Medications:  Current Outpatient Medications:    acetaminophen (TYLENOL) 500 MG tablet, Take 1,000 mg by mouth every 6  (six) hours as needed for moderate pain., Disp: , Rfl:    apixaban (ELIQUIS) 5 MG TABS tablet, Take 1 tablet (5 mg total) by mouth 2 (two) times daily., Disp: 60 tablet, Rfl: 6   benazepril (LOTENSIN) 40 MG tablet, Take 40 mg by mouth daily., Disp: , Rfl:    LANTUS SOLOSTAR 100 UNIT/ML Solostar Pen, Inject 24 Units into the skin at bedtime. (Patient taking differently: Inject 10-12 Units into the skin See admin instructions. Inject 0-12 units into the skin in the morning and 12 units at bedtime.), Disp: 15 mL, Rfl: 1   lidocaine (HM LIDOCAINE PATCH) 4 %, Place 1 patch onto the skin daily as needed (pain)., Disp: , Rfl:    Misc Natural Products (BLOOD SUGAR BALANCE) TABS, Take 1 tablet by mouth daily as needed (low blood sugar). Glucofort, Disp: , Rfl:    neomycin-bacitracin-polymyxin (NEOSPORIN) OINT, Apply 1 Application topically as needed (dry nose)., Disp: , Rfl:    oxymetazoline (AFRIN) 0.05 % nasal spray, Place 1 spray into both nostrils 2 (two) times daily as needed for congestion., Disp: , Rfl:    pantoprazole (PROTONIX) 40 MG tablet, Take 40 mg by mouth daily., Disp: , Rfl:    PREMARIN vaginal cream, PLACE 1 APPLICATORFUL VAGINALLY 2 TIMES A WEEK. MONDAY AND WEDNESDAY OR THURSDAY. (Patient not taking: Reported on 05/01/2022), Disp: 30 g, Rfl: 3  propranolol (INDERAL) 10 MG tablet, Take 1 tablet (10 mg total) by mouth 4 (four) times daily. As needed (Patient taking differently: Take 10 mg by mouth 4 (four) times daily as needed (palpitations).), Disp: 120 tablet, Rfl: 6   propranolol ER (INDERAL LA) 80 MG 24 hr capsule, Take 1 capsule (80 mg total) by mouth in the morning., Disp: 30 capsule, Rfl: 1  Allergies:  Allergies  Allergen Reactions   Lidocaine Hcl Anaphylaxis   Piperacillin Sod-Tazobactam So Itching   Cyclosporine Other (See Comments)    burns   Hydromorphone Hcl Nausea And Vomiting    Can take with zofran   Morphine And Related Nausea And Vomiting   Percocet  [Oxycodone-Acetaminophen] Nausea And Vomiting    Past Medical History, Surgical history, Social history, and Family History were reviewed and updated.  Review of Systems: Review of Systems  Constitutional:  Negative for malaise/fatigue.  HENT: Negative.    Eyes: Negative.   Respiratory: Negative.    Cardiovascular: Negative.   Gastrointestinal:  Negative for abdominal pain and diarrhea.  Genitourinary: Negative.   Musculoskeletal:  Positive for myalgias.  Skin: Negative.   Neurological: Negative.   Endo/Heme/Allergies: Negative.   Psychiatric/Behavioral: Negative.       Physical Exam:  weight is 143 lb (64.9 kg). Her oral temperature is 97.5 F (36.4 C) (abnormal). Her blood pressure is 164/83 (abnormal) and her pulse is 65. Her respiration is 18 and oxygen saturation is 98%.   Vital signs show a temperature of 98.  Pulse 62.  Blood pressure 147/72.  Weight is 141 pounds.  Physical Exam Vitals reviewed.  HENT:     Head: Normocephalic and atraumatic.  Eyes:     Pupils: Pupils are equal, round, and reactive to light.  Cardiovascular:     Rate and Rhythm: Normal rate and regular rhythm.     Heart sounds: Normal heart sounds.  Pulmonary:     Effort: Pulmonary effort is normal.     Breath sounds: Normal breath sounds.  Abdominal:     General: Bowel sounds are normal.     Palpations: Abdomen is soft.    Musculoskeletal:        General: No tenderness or deformity.     Cervical back: Normal range of motion.  Lymphadenopathy:     Cervical: No cervical adenopathy.  Skin:    General: Skin is warm and dry.     Findings: No erythema or rash.  Neurological:     Mental Status: She is alert and oriented to person, place, and time.  Psychiatric:        Behavior: Behavior normal.        Thought Content: Thought content normal.        Judgment: Judgment normal.     Lab Results  Component Value Date   WBC 5.6 05/02/2022   HGB 14.1 05/02/2022   HCT 41.7 05/02/2022   MCV  97.0 05/02/2022   PLT 314 05/02/2022     Chemistry      Component Value Date/Time   NA 145 03/01/2022 1025   NA 144 04/04/2017 1139   NA 140 09/07/2016 0946   K 4.6 03/01/2022 1025   K 3.5 04/04/2017 1139   K 4.1 09/07/2016 0946   CL 107 03/01/2022 1025   CL 103 04/04/2017 1139   CO2 33 (H) 03/01/2022 1025   CO2 33 04/04/2017 1139   CO2 28 09/07/2016 0946   BUN 20 03/01/2022 1025   BUN 15 04/04/2017 1139  BUN 22.6 09/07/2016 0946   CREATININE 0.84 03/01/2022 1025   CREATININE 0.9 04/04/2017 1139   CREATININE 0.8 09/07/2016 0946      Component Value Date/Time   CALCIUM 10.0 03/01/2022 1025   CALCIUM 9.5 04/04/2017 1139   CALCIUM 9.6 09/07/2016 0946   ALKPHOS 81 03/01/2022 1025   ALKPHOS 93 (H) 04/04/2017 1139   ALKPHOS 81 09/07/2016 0946   AST 18 03/01/2022 1025   AST 32 09/07/2016 0946   ALT 13 03/01/2022 1025   ALT 36 04/04/2017 1139   ALT 33 09/07/2016 0946   BILITOT 1.1 03/01/2022 1025   BILITOT 1.34 (H) 09/07/2016 0946       Impression and Plan: Ms. Lembo is 77 year old white female with the anti-phospholipid antibody syndrome. This really has not been a problem for her. The bigger issue has been the hepatic problems and her diabetes. She has steatosis. She has NASH.  Continue follow up with cardiology, GI and her PCP. In terms of her anti-phospholipid antibody syndrome, she is doing well. She will continue her Eliquis at this time. Glad to hear that one of the nodules of her sisters lungs resolved.    RTC 3 months MD, labs   Hughie Closs, [place] 1/16/202410:54 AM

## 2022-05-03 ENCOUNTER — Ambulatory Visit (HOSPITAL_COMMUNITY)
Admission: RE | Admit: 2022-05-03 | Discharge: 2022-05-03 | Disposition: A | Discharge status: Home / Self Care | Payer: PPO | Source: Ambulatory Visit | Attending: Gastroenterology | Admitting: Gastroenterology

## 2022-05-03 ENCOUNTER — Ambulatory Visit (HOSPITAL_COMMUNITY): Payer: PPO | Admitting: Certified Registered Nurse Anesthetist

## 2022-05-03 ENCOUNTER — Other Ambulatory Visit: Payer: Self-pay

## 2022-05-03 ENCOUNTER — Encounter (HOSPITAL_COMMUNITY): Payer: Self-pay | Admitting: Gastroenterology

## 2022-05-03 ENCOUNTER — Encounter (HOSPITAL_COMMUNITY)
Admission: RE | Disposition: A | Discharge status: Home / Self Care | Payer: Self-pay | Source: Ambulatory Visit | Attending: Gastroenterology

## 2022-05-03 ENCOUNTER — Ambulatory Visit (HOSPITAL_BASED_OUTPATIENT_CLINIC_OR_DEPARTMENT_OTHER): Payer: PPO | Admitting: Certified Registered Nurse Anesthetist

## 2022-05-03 DIAGNOSIS — R109 Unspecified abdominal pain: Secondary | ICD-10-CM | POA: Diagnosis not present

## 2022-05-03 DIAGNOSIS — I10 Essential (primary) hypertension: Secondary | ICD-10-CM | POA: Insufficient documentation

## 2022-05-03 DIAGNOSIS — K2970 Gastritis, unspecified, without bleeding: Secondary | ICD-10-CM | POA: Insufficient documentation

## 2022-05-03 DIAGNOSIS — K900 Celiac disease: Secondary | ICD-10-CM | POA: Diagnosis not present

## 2022-05-03 DIAGNOSIS — K766 Portal hypertension: Secondary | ICD-10-CM | POA: Insufficient documentation

## 2022-05-03 DIAGNOSIS — Z86718 Personal history of other venous thrombosis and embolism: Secondary | ICD-10-CM | POA: Insufficient documentation

## 2022-05-03 DIAGNOSIS — M797 Fibromyalgia: Secondary | ICD-10-CM | POA: Diagnosis not present

## 2022-05-03 DIAGNOSIS — J45909 Unspecified asthma, uncomplicated: Secondary | ICD-10-CM | POA: Insufficient documentation

## 2022-05-03 DIAGNOSIS — I8500 Esophageal varices without bleeding: Secondary | ICD-10-CM | POA: Diagnosis not present

## 2022-05-03 DIAGNOSIS — I8510 Secondary esophageal varices without bleeding: Secondary | ICD-10-CM | POA: Insufficient documentation

## 2022-05-03 DIAGNOSIS — Z7901 Long term (current) use of anticoagulants: Secondary | ICD-10-CM | POA: Insufficient documentation

## 2022-05-03 DIAGNOSIS — E119 Type 2 diabetes mellitus without complications: Secondary | ICD-10-CM | POA: Insufficient documentation

## 2022-05-03 DIAGNOSIS — Z79899 Other long term (current) drug therapy: Secondary | ICD-10-CM | POA: Diagnosis not present

## 2022-05-03 DIAGNOSIS — Z9049 Acquired absence of other specified parts of digestive tract: Secondary | ICD-10-CM | POA: Diagnosis not present

## 2022-05-03 DIAGNOSIS — R1013 Epigastric pain: Secondary | ICD-10-CM | POA: Insufficient documentation

## 2022-05-03 DIAGNOSIS — K7460 Unspecified cirrhosis of liver: Secondary | ICD-10-CM | POA: Diagnosis not present

## 2022-05-03 DIAGNOSIS — K7581 Nonalcoholic steatohepatitis (NASH): Secondary | ICD-10-CM | POA: Diagnosis not present

## 2022-05-03 DIAGNOSIS — Z794 Long term (current) use of insulin: Secondary | ICD-10-CM | POA: Insufficient documentation

## 2022-05-03 DIAGNOSIS — K3189 Other diseases of stomach and duodenum: Secondary | ICD-10-CM | POA: Diagnosis not present

## 2022-05-03 HISTORY — PX: BIOPSY: SHX5522

## 2022-05-03 HISTORY — PX: ESOPHAGOGASTRODUODENOSCOPY (EGD) WITH PROPOFOL: SHX5813

## 2022-05-03 LAB — GLUCOSE, CAPILLARY: Glucose-Capillary: 168 mg/dL — ABNORMAL HIGH (ref 70–99)

## 2022-05-03 SURGERY — ESOPHAGOGASTRODUODENOSCOPY (EGD) WITH PROPOFOL
Anesthesia: Monitor Anesthesia Care | Laterality: Bilateral

## 2022-05-03 MED ORDER — LACTATED RINGERS IV SOLN: Freq: Once | INTRAVENOUS | Status: AC

## 2022-05-03 MED ORDER — PROPOFOL 500 MG/50ML IV EMUL
INTRAVENOUS | Status: DC | PRN
  Administered 2022-05-03: 125 ug/kg/min via INTRAVENOUS

## 2022-05-03 MED ORDER — PROPOFOL 500 MG/50ML IV EMUL
INTRAVENOUS | Status: AC
  Filled 2022-05-03: qty 50

## 2022-05-03 MED ORDER — ONDANSETRON HCL 4 MG/2ML IJ SOLN
INTRAMUSCULAR | Status: DC | PRN
  Administered 2022-05-03: 4 mg via INTRAVENOUS

## 2022-05-03 MED ORDER — PROPOFOL 10 MG/ML IV BOLUS
INTRAVENOUS | Status: DC | PRN
  Administered 2022-05-03 (×2): 20 mg via INTRAVENOUS
  Administered 2022-05-03: 40 mg via INTRAVENOUS

## 2022-05-03 MED ORDER — SODIUM CHLORIDE 0.9 % IV SOLN: INTRAVENOUS | Status: DC

## 2022-05-03 SURGICAL SUPPLY — 15 items

## 2022-05-03 NOTE — Op Note (Signed)
[facility] Patient Name: Laurie Green Procedure Date: 05/03/2022 MRN: [id number] Attending MD: Arta Silence , MD, [id number] Date of Birth: 1945/08/26 CSN: [id number] Age: 77 Admit Type: Outpatient Procedure:                Upper GI endoscopy Indications:              Epigastric abdominal pain, Abdominal pain in the                            right upper quadrant, Follow-up of esophageal                            varices Providers:                Arta Silence, MD, Adah Perl RN, RN,                            Arthor Gorter Dalton, Technician Referring MD:             Windy Carina, MD Medicines:                Monitored Anesthesia Care Complications:            No immediate complications. Estimated Blood Loss:     Estimated blood loss: none. Procedure:                Pre-Anesthesia Assessment:                           - Prior to the procedure, a History and Physical                            was performed, and patient medications and                            allergies were reviewed. The patient's tolerance of                            previous anesthesia was also reviewed. The risks                            and benefits of the procedure and the sedation                            options and risks were discussed with the patient.                            All questions were answered, and informed consent                            was obtained. Prior Anticoagulants: The patient has                            taken Eliquis (apixaban), last dose was 1 day prior  to procedure. ASA Grade Assessment: III - A patient                            with severe systemic disease. After reviewing the                            risks and benefits, the patient was deemed in                            satisfactory condition to undergo the procedure.                           After obtaining informed consent, the endoscope was                             passed under direct vision. Throughout the                            procedure, the patient's blood pressure, pulse, and                            oxygen saturations were monitored continuously. The                            [id number]-H190 ([id number] Olympus endoscope was introduced                            through the mouth, and advanced to the second part                            of duodenum. The upper GI endoscopy was                            accomplished without difficulty. The patient                            tolerated the procedure well. Scope In: Scope Out: Findings:      Grade II varices were found in the lower third of the esophagus. They       were small in size.      The exam of the esophagus was otherwise normal.      Patchy mild inflammation was found in the entire examined stomach.       Biopsies were taken with a cold forceps for histology.      Diffuse portal hypertensive gastropathy was found in the entire examined       stomach.      The duodenal bulb, first portion of the duodenum and second portion of       the duodenum were normal. Impression:               - Grade II esophageal varices.                           - Gastritis. Biopsied.                           -  Portal hypertensive gastropathy.                           - Normal duodenal bulb, first portion of the                            duodenum and second portion of the duodenum. Moderate Sedation:      Not Applicable - Patient had care per Anesthesia. Recommendation:           - Patient has a contact number available for                            emergencies. The signs and symptoms of potential                            delayed complications were discussed with the                            patient. Return to normal activities tomorrow.                            Written discharge instructions were provided to the                            patient.                           - Discharge  patient to home (via wheelchair).                           - Resume previous diet today.                           - Continue present medications.                           - Resume Eliquis (apixaban) at prior dose tomorrow.                           - Return to GI clinic after studies are complete.                            May consider up-titration of propranolol dosing.                           - Return to referring physician as previously                            scheduled. Procedure Code(s):        --- Professional ---                           [phone], Esophagogastroduodenoscopy, flexible,                            transoral; with biopsy, single or multiple Diagnosis  Code(s):        --- Professional ---                           I85.00, Esophageal varices without bleeding                           K29.70, Gastritis, unspecified, without bleeding                           K76.6, Portal hypertension                           K31.89, Other diseases of stomach and duodenum                           R10.13, Epigastric pain                           R10.11, Right upper quadrant pain CPT copyright 2022 American [employer] Association. All rights reserved. The codes documented in this report are preliminary and upon coder review may  be revised to meet current compliance requirements. Arta Silence, MD 05/03/2022 10:28:55 AM This report has been signed electronically. Number of Addenda: 0

## 2022-05-03 NOTE — H&P (Signed)
[facility] H/P Note  Chief Complaint: epigastric pain, cirrhosis  HPI: ASA FATH is an 77 y.o. female.  Evaluation of cirrhosis (variceal screening/surveillance) and evaluation of upper abdominal pain.  Past Medical History:  Diagnosis Date   Anginal pain (HCC)    Anti-cardiolipin antibody syndrome (HCC)    antiphospholipid antibody syndrome   Antiphospholipid antibody with hypercoagulable state ([place]) 11/07/2012   Arthritis    Asthma    well controlled, no rescue inhaler used   Blood dyscrasia    anticardiolipid syndrome   Blood transfusion    age 15 yr old-none since   Celiac disease    Diabetes mellitus    Type II   Diverticulitis    DVT (deep venous thrombosis) ([place]) 05/01/2011   "to liver"   Family history of adverse reaction to anesthesia    N&V   Fibromyalgia    GERD (gastroesophageal reflux disease)    11/01/16- not problem   H/O seasonal allergies    Heart murmur    "not a problem"   Hypertension    NASH (nonalcoholic steatohepatitis) 05/01/2011   Nausea & vomiting    11-17-14 "nausea and vomiting after meals" at present discussed with Dr. Oletta Lamas office per pt.   Pneumonia 2021   PONV (postoperative nausea and vomiting)    'I dont have it if they give me something in the IV."   Portal hypertension ([place])    Stroke ([place])    tia- eye,   TIA (transient ischemic attack)    Varices 05/01/2011   Varices, esophageal (HCC)    Varices, gastric     Past Surgical History:  Procedure Laterality Date   ABDOMINAL HYSTERECTOMY     ANKLE FRACTURE SURGERY Left    retained hardware   CHOLECYSTECTOMY     COLON SURGERY     hx. diverticulitis   COLONOSCOPY N/A 11/23/2014   Procedure: COLONOSCOPY;  Surgeon: [name], MD;  Location: [facility] END[facility]COPY;  Service: Endoscopy;  Laterality: N/A;   ESOPHAGOGASTRODUODENOSCOPY  04/28/2011   Procedure: ESOPHAGOGASTRODUODENOSCOPY (EGD);  Surgeon: [name]., MD;  Location: [facility] ENDOSCOPY;  Service: Endoscopy;   Laterality: N/A;   ESOPHAGOGASTRODUODENOSCOPY N/A 11/23/2014   Procedure: ESOPHAGOGASTRODUODENOSCOPY (EGD);  Surgeon: [name], MD;  Location: [facility];  Service: Endoscopy;  Laterality: N/A;   ESOPHAGOGASTRODUODENOSCOPY (EGD) WITH PROPOFOL N/A 11/06/2016   Procedure: ESOPHAGOGASTRODUODENOSCOPY (EGD) WITH PROPOFOL;  Surgeon: [name], MD;  Location: [facility];  Service: Endoscopy;  Laterality: N/A;   EUS N/A 09/25/2012   Procedure: ESOPHAGEAL ENDOSCOPIC ULTRASOUND (EUS) RADIAL;  Surgeon: Arta Silence, MD;  Location: [facility] ENDOSCOPY;  Service: Endoscopy;  Laterality: N/A;   HERNIA REPAIR     luq incisional   IVC FILTER INSERTION N/A 07/19/2020   Procedure: IVC FILTER INSERTION;  Surgeon: Waynetta Sandy, MD;  Location: [facility] CV [facility];  Service: Cardiovascular;  Laterality: N/A;   IVC FILTER REMOVAL N/A 12/12/2021   Procedure: IVC FILTER REMOVAL;  Surgeon: Waynetta Sandy, MD;  Location: [facility];  Service: Cardiovascular;  Laterality: N/A;   IVC VENOGRAPHY N/A 12/12/2021   Procedure: IVC Venography;  Surgeon: Waynetta Sandy, MD;  Location: [facility];  Service: Cardiovascular;  Laterality: N/A;   SPLENECTOMY, TOTAL     TONSILLECTOMY     TOTAL KNEE ARTHROPLASTY Right 07/21/2020   Procedure: TOTAL KNEE ARTHROPLASTY;  Surgeon: Susa Day, MD;  Location: [facility];  Service: Orthopedics;  Laterality: Right;  2.5 hrs    Medications Prior to  Admission  Medication Sig Dispense Refill   acetaminophen (TYLENOL) 500 MG tablet Take 1,000 mg by mouth every 6 (six) hours as needed for moderate pain.     apixaban (ELIQUIS) 5 MG TABS tablet Take 1 tablet (5 mg total) by mouth 2 (two) times daily. 60 tablet 6   benazepril (LOTENSIN) 40 MG tablet Take 40 mg by mouth daily.     LANTUS SOLOSTAR 100 UNIT/ML Solostar Pen Inject 24 Units into the skin at bedtime. (Patient taking differently: Inject 10-12 Units into the skin See admin instructions.  Inject 0-12 units into the skin in the morning and 12 units at bedtime.) 15 mL 1   lidocaine (HM LIDOCAINE PATCH) 4 % Place 1 patch onto the skin daily as needed (pain).     Misc Natural Products (BLOOD SUGAR BALANCE) TABS Take 1 tablet by mouth daily as needed (low blood sugar). Glucofort     neomycin-bacitracin-polymyxin (NEOSPORIN) OINT Apply 1 Application topically as needed (dry nose).     oxymetazoline (AFRIN) 0.05 % nasal spray Place 1 spray into both nostrils 2 (two) times daily as needed for congestion.     pantoprazole (PROTONIX) 40 MG tablet Take 40 mg by mouth daily.     propranolol (INDERAL) 10 MG tablet Take 1 tablet (10 mg total) by mouth 4 (four) times daily. As needed (Patient taking differently: Take 10 mg by mouth 4 (four) times daily as needed (palpitations).) 120 tablet 6   propranolol ER (INDERAL LA) 80 MG 24 hr capsule Take 1 capsule (80 mg total) by mouth in the morning. 30 capsule 1   PREMARIN vaginal cream PLACE 1 APPLICATORFUL VAGINALLY 2 TIMES A WEEK. MONDAY AND WEDNESDAY OR THURSDAY. (Patient not taking: Reported on 05/01/2022) 30 g 3    Allergies:  Allergies  Allergen Reactions   Lidocaine Hcl Anaphylaxis   Piperacillin Sod-Tazobactam So Itching   Cyclosporine Other (See Comments)    burns   Hydromorphone Hcl Nausea And Vomiting    Can take with zofran   Morphine And Related Nausea And Vomiting   Percocet [Oxycodone-Acetaminophen] Nausea And Vomiting    Family History  Problem Relation Age of Onset   Cancer Mother    Alzheimer's disease Mother    Cancer Father    Heart defect Father     Social History:  reports that she has never smoked. She has never used smokeless tobacco. She reports that she does not drink alcohol and does not use drugs.   ROS: As per HPI, all others negative  Blood pressure (!) 157/65, temperature 97.7 F (36.5 C), temperature source Tympanic, resp. rate 11, height 5' 3.5" (1.613 m), weight 63.5 kg, SpO2 97 %. General  appearance: NAD HEENT:  [place]/AT, anicteric NECK:  Supple CV:  Regular RESP:  No respiratory distress ABD:  Soft, mild upper abdominal discomfort NEURO:  No encephalopathy, A/O  Results for orders placed or performed during the hospital encounter of 05/03/22 (from the past 48 hour(s))  Glucose, capillary     Status: Abnormal   Collection Time: 05/03/22  9:20 AM  Result Value Ref Range   Glucose-Capillary 168 (H) 70 - 99 mg/dL    Comment: Glucose reference range applies only to samples taken after fasting for at least 8 hours.   No results found.  Assessment/Plan   Upper abdominal pain.  Ongoing for many years.  Multiple extensive prior evaluations.  Post-cholecystectomy. Cirrhosis with history of gastric varices. Chronic anticoagulation, apixaban, off 24 hours. Endoscopy for variceal surveillance (gastric),  variceal screening (esophagus) and evaluation of upper abdominal pain. Risks (bleeding, infection, bowel perforation that could require surgery, sedation-related changes in cardiopulmonary systems), benefits (identification and possible treatment of source of symptoms, exclusion of certain causes of symptoms), and alternatives (watchful waiting, radiographic imaging studies, empiric medical treatment) of upper endoscopy (EGD) were explained to patient/family in detail and patient wishes to proceed.   Demonie Kassa M 05/03/2022, 10:00 AM

## 2022-05-03 NOTE — Anesthesia Postprocedure Evaluation (Signed)
Anesthesia Post Note  Patient: Laurie Green  Procedure(s) Performed: ESOPHAGOGASTRODUODENOSCOPY (EGD) WITH PROPOFOL (Bilateral) BIOPSY     Patient location during evaluation: PACU Anesthesia Type: MAC Level of consciousness: awake and alert Pain management: pain level controlled Vital Signs Assessment: post-procedure vital signs reviewed and stable Respiratory status: spontaneous breathing, nonlabored ventilation, respiratory function stable and patient connected to nasal cannula oxygen Cardiovascular status: stable and blood pressure returned to baseline Postop Assessment: no apparent nausea or vomiting Anesthetic complications: no  No notable events documented.  Last Vitals:  Vitals:   05/03/22 1022 05/03/22 1030  BP: (!) 111/52 121/73  Pulse: (!) 59 64  Resp: 19 15  Temp: (!) 36.1 C   SpO2: 100% 100%    Last Pain:  Vitals:   05/03/22 1030  TempSrc:   PainSc: 8                  Keltin Baird S

## 2022-05-03 NOTE — Discharge Instructions (Signed)
YOU HAD AN ENDOSCOPIC PROCEDURE TODAY: Refer to the procedure report and other information in the discharge instructions given to you for any specific questions about what was found during the examination. If this information does not answer your questions, please call the [facility] office at [phone] to clarify.   YOU SHOULD EXPECT: Some feelings of bloating in the abdomen. Passage of more gas than usual. Walking can help get rid of the air that was put into your GI tract during the procedure and reduce the bloating.  DIET: Your first meal following the procedure should be a light meal and then it is ok to progress to your normal diet. A half-sandwich or bowl of soup is an example of a good first meal. Heavy or fried foods are harder to digest and may make you feel nauseous or bloated. Drink plenty of fluids but you should avoid alcoholic beverages for 24 hours.   ACTIVITY: Your care partner should take you home directly after the procedure. You should plan to take it easy, moving slowly for the rest of the day. You can resume normal activity the day after the procedure however YOU SHOULD NOT DRIVE, use power tools, machinery or perform tasks that involve climbing or major physical exertion for 24 hours (because of the sedation medicines used during the test).   SYMPTOMS TO REPORT IMMEDIATELY: A gastroenterologist can be reached at any hour. Please call [phone]  for any of the following symptoms:   Following upper endoscopy (EGD, EUS, ERCP, esophageal dilation) Vomiting of blood or coffee ground material  New, significant abdominal pain  New, significant chest pain or pain under the shoulder blades  Painful or persistently difficult swallowing  New shortness of breath  Black, tarry-looking or red, bloody stools  FOLLOW UP:  If any biopsies were taken you will be contacted by phone or by letter within the next 1-3 weeks. Call [phone]  if you have not heard about the biopsies in 3  weeks.  Please also call with any specific questions about appointments or follow up tests.

## 2022-05-03 NOTE — Transfer of Care (Signed)
Immediate Anesthesia Transfer of Care Note  Patient: Laurie Green  Procedure(s) Performed: ESOPHAGOGASTRODUODENOSCOPY (EGD) WITH PROPOFOL (Bilateral) BIOPSY  Patient Location: Endoscopy Unit  Anesthesia Type:MAC  Level of Consciousness: drowsy  Airway & Oxygen Therapy: Patient Spontanous Breathing and Patient connected to face mask  Post-op Assessment: Report given to RN and Post -op Vital signs reviewed and stable  Post vital signs: Reviewed and stable  Last Vitals:  Vitals Value Taken Time  BP    Temp    Pulse    Resp    SpO2      Last Pain:  Vitals:   05/03/22 0909  TempSrc: Tympanic  PainSc: 8       Patients Stated Pain Goal: 3 (33/61/22 4497)  Complications: No notable events documented.

## 2022-05-03 NOTE — Anesthesia Procedure Notes (Signed)
Procedure Name: MAC Date/Time: 05/03/2022 10:02 AM  Performed by: Claudia Desanctis, CRNAPre-anesthesia Checklist: Patient identified, Emergency Drugs available, Suction available and Patient being monitored Patient Re-evaluated:Patient Re-evaluated prior to induction Oxygen Delivery Method: Simple face mask

## 2022-05-03 NOTE — Anesthesia Preprocedure Evaluation (Signed)
Anesthesia Evaluation  Patient identified by MRN, date of birth, ID band Patient awake    Reviewed: Allergy & Precautions, H&P , NPO status , Patient's Chart, lab work & pertinent test results  Airway Mallampati: II  TM Distance: >3 FB Neck ROM: Full    Dental no notable dental hx.    Pulmonary asthma    Pulmonary exam normal breath sounds clear to auscultation       Cardiovascular hypertension, Normal cardiovascular exam Rhythm:Regular Rate:Normal     Neuro/Psych TIA negative psych ROS   GI/Hepatic negative GI ROS,,,(+) Cirrhosis   Esophageal Varices    NASH   Endo/Other  diabetes    Renal/GU negative Renal ROS  negative genitourinary   Musculoskeletal negative musculoskeletal ROS (+)    Abdominal   Peds negative pediatric ROS (+)  Hematology negative hematology ROS (+)   Anesthesia Other Findings   Reproductive/Obstetrics negative OB ROS                             Anesthesia Physical Anesthesia Plan  ASA: 3  Anesthesia Plan: MAC   Post-op Pain Management: Minimal or no pain anticipated   Induction: Intravenous  PONV Risk Score and Plan: 2 and Propofol infusion and Treatment may vary due to age or medical condition  Airway Management Planned: Simple Face Mask  Additional Equipment:   Intra-op Plan:   Post-operative Plan:   Informed Consent: I have reviewed the patients History and Physical, chart, labs and discussed the procedure including the risks, benefits and alternatives for the proposed anesthesia with the patient or authorized representative who has indicated his/her understanding and acceptance.     Dental advisory given  Plan Discussed with: CRNA and Surgeon  Anesthesia Plan Comments:        Anesthesia Quick Evaluation

## 2022-05-04 LAB — SURGICAL PATHOLOGY

## 2022-05-06 ENCOUNTER — Encounter (HOSPITAL_COMMUNITY): Payer: Self-pay | Admitting: Gastroenterology

## 2022-05-22 ENCOUNTER — Other Ambulatory Visit: Payer: Self-pay | Admitting: Gastroenterology

## 2022-05-22 DIAGNOSIS — K7460 Unspecified cirrhosis of liver: Secondary | ICD-10-CM

## 2022-05-24 DIAGNOSIS — Z96651 Presence of right artificial knee joint: Secondary | ICD-10-CM | POA: Diagnosis not present

## 2022-05-24 DIAGNOSIS — M25561 Pain in right knee: Secondary | ICD-10-CM | POA: Diagnosis not present

## 2022-05-24 DIAGNOSIS — M25562 Pain in left knee: Secondary | ICD-10-CM | POA: Diagnosis not present

## 2022-05-24 DIAGNOSIS — Z96652 Presence of left artificial knee joint: Secondary | ICD-10-CM | POA: Diagnosis not present

## 2022-05-24 IMAGING — DX DG KNEE 1-2V*R*
2 series · 2 of 2 positions shown · non-contrast
Comparison: None.

CLINICAL DATA: Postoperative right knee replacement

EXAM:
RIGHT KNEE - 1-2 VIEW

[knee ap]
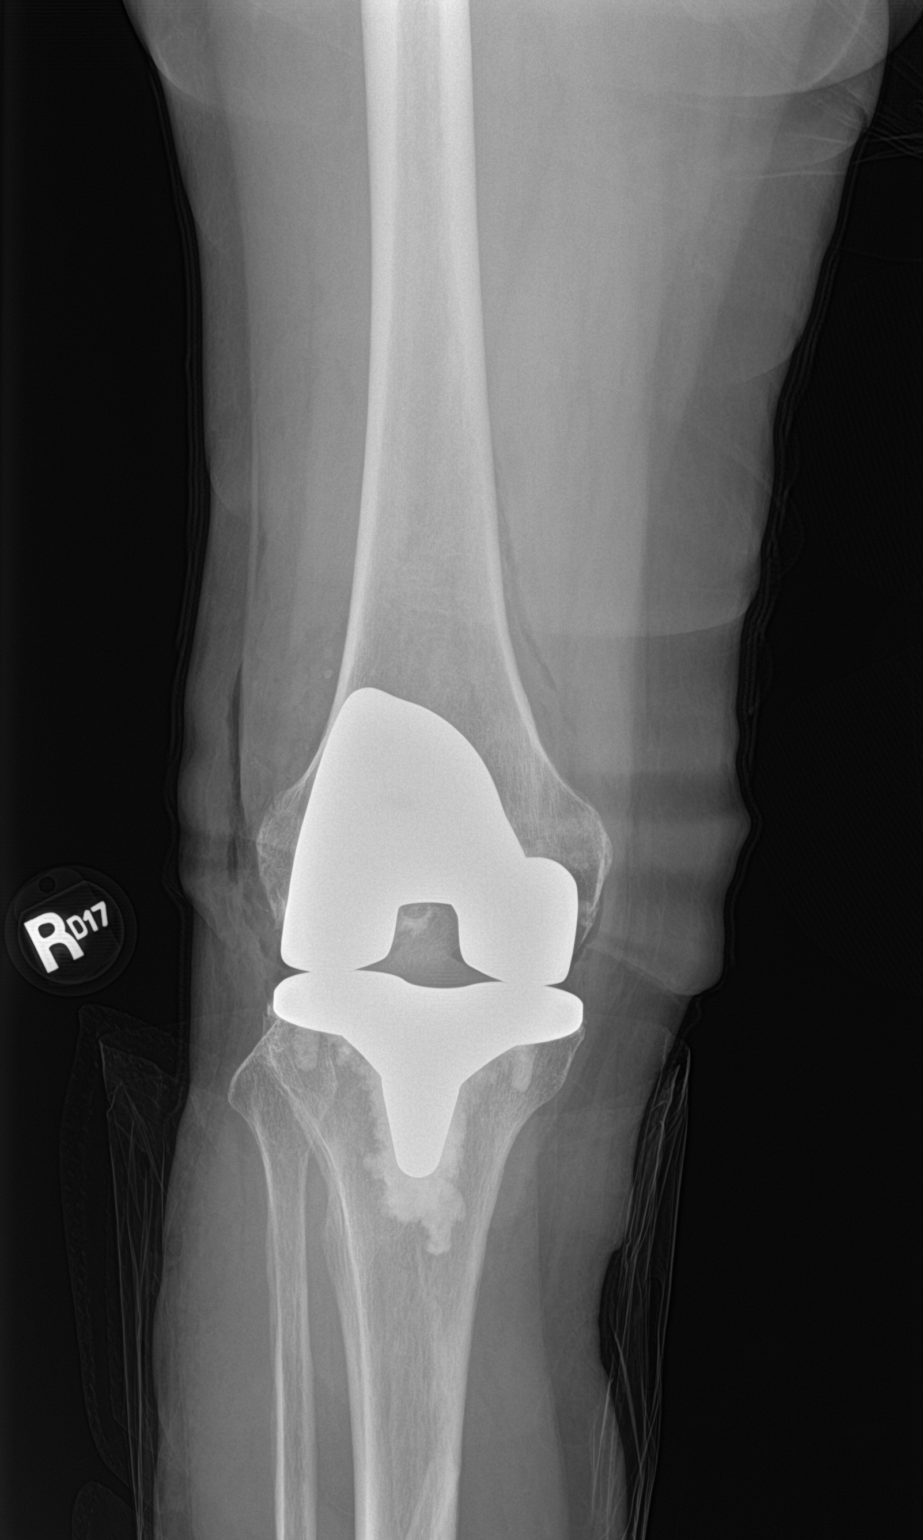

[knee lat]
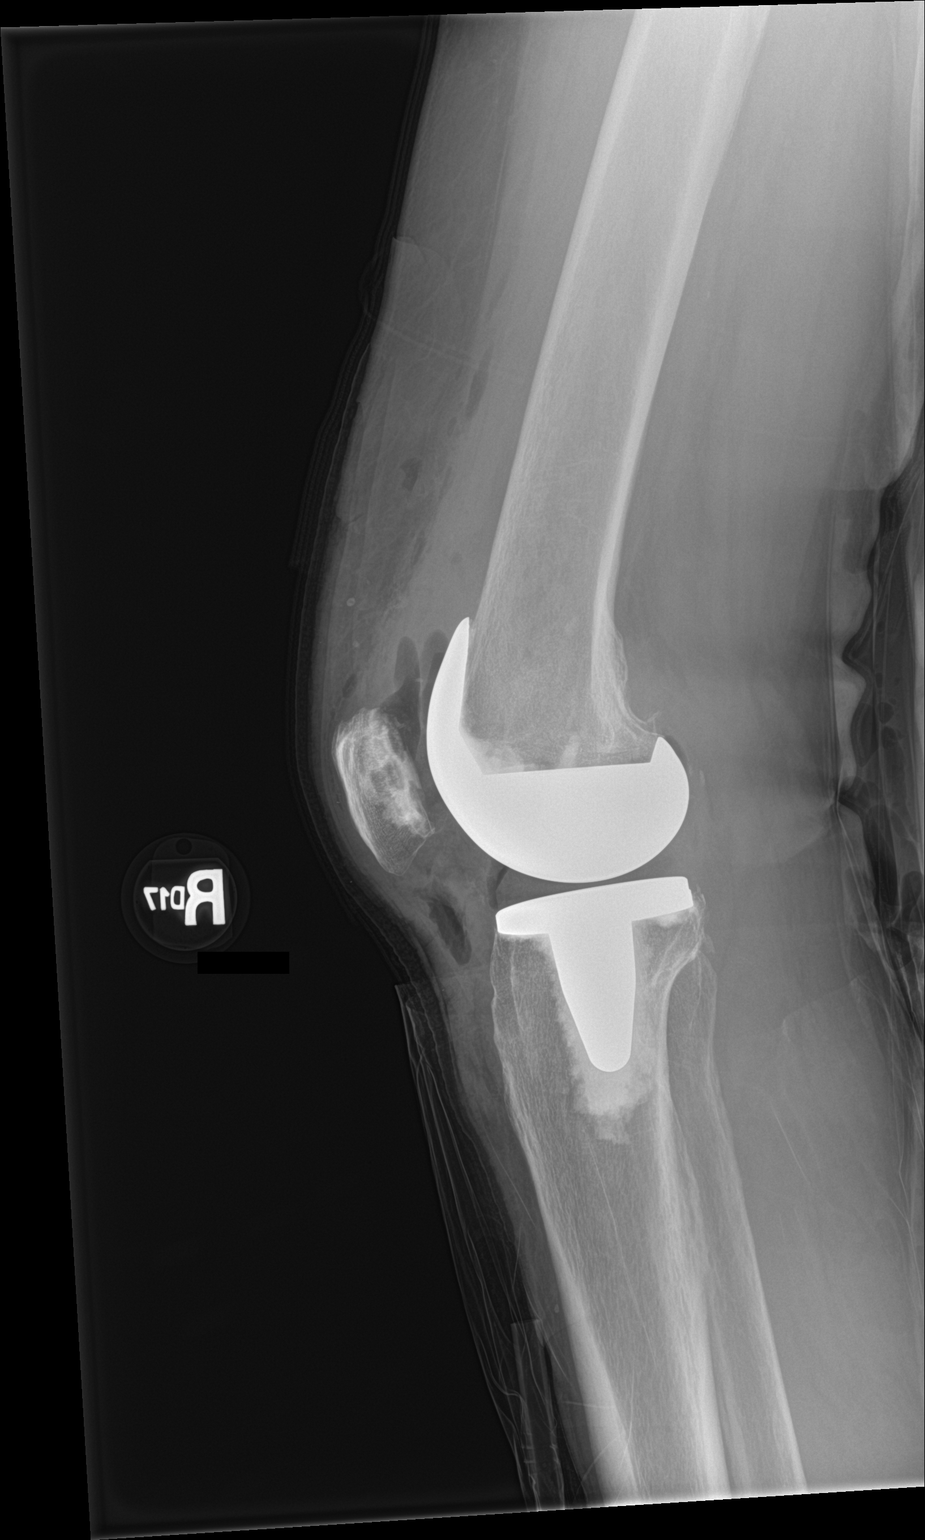

[2 of 2 positions shown; findings below may reference images not displayed]

FINDINGS: Status post right knee total arthroplasty with expected overlying
postoperative findings. No evidence of perihardware fracture or
component malpositioning.
IMPRESSION: Status post right knee total arthroplasty with expected overlying
postoperative findings. No evidence of perihardware fracture or
component malpositioning.

## 2022-05-31 DIAGNOSIS — D6861 Antiphospholipid syndrome: Secondary | ICD-10-CM | POA: Diagnosis not present

## 2022-05-31 DIAGNOSIS — Z794 Long term (current) use of insulin: Secondary | ICD-10-CM | POA: Diagnosis not present

## 2022-05-31 DIAGNOSIS — G8929 Other chronic pain: Secondary | ICD-10-CM | POA: Diagnosis not present

## 2022-05-31 DIAGNOSIS — E1165 Type 2 diabetes mellitus with hyperglycemia: Secondary | ICD-10-CM | POA: Diagnosis not present

## 2022-05-31 DIAGNOSIS — E782 Mixed hyperlipidemia: Secondary | ICD-10-CM | POA: Diagnosis not present

## 2022-05-31 DIAGNOSIS — F321 Major depressive disorder, single episode, moderate: Secondary | ICD-10-CM | POA: Diagnosis not present

## 2022-05-31 DIAGNOSIS — K7581 Nonalcoholic steatohepatitis (NASH): Secondary | ICD-10-CM | POA: Diagnosis not present

## 2022-05-31 DIAGNOSIS — Z0000 Encounter for general adult medical examination without abnormal findings: Secondary | ICD-10-CM | POA: Diagnosis not present

## 2022-05-31 DIAGNOSIS — I10 Essential (primary) hypertension: Secondary | ICD-10-CM | POA: Diagnosis not present

## 2022-05-31 DIAGNOSIS — M25562 Pain in left knee: Secondary | ICD-10-CM | POA: Diagnosis not present

## 2022-05-31 DIAGNOSIS — H6593 Unspecified nonsuppurative otitis media, bilateral: Secondary | ICD-10-CM | POA: Diagnosis not present

## 2022-05-31 DIAGNOSIS — M25561 Pain in right knee: Secondary | ICD-10-CM | POA: Diagnosis not present

## 2022-06-01 ENCOUNTER — Other Ambulatory Visit (HOSPITAL_COMMUNITY): Payer: Self-pay | Admitting: Specialist

## 2022-06-01 DIAGNOSIS — Z96651 Presence of right artificial knee joint: Secondary | ICD-10-CM

## 2022-06-13 ENCOUNTER — Other Ambulatory Visit: Payer: Self-pay | Admitting: Gastroenterology

## 2022-06-13 DIAGNOSIS — K7460 Unspecified cirrhosis of liver: Secondary | ICD-10-CM | POA: Diagnosis not present

## 2022-06-13 DIAGNOSIS — K862 Cyst of pancreas: Secondary | ICD-10-CM

## 2022-06-13 DIAGNOSIS — D6861 Antiphospholipid syndrome: Secondary | ICD-10-CM | POA: Diagnosis not present

## 2022-06-13 DIAGNOSIS — I8500 Esophageal varices without bleeding: Secondary | ICD-10-CM | POA: Diagnosis not present

## 2022-06-15 ENCOUNTER — Other Ambulatory Visit: Payer: PPO

## 2022-06-19 ENCOUNTER — Encounter (HOSPITAL_COMMUNITY)
Admission: RE | Admit: 2022-06-19 | Discharge: 2022-06-19 | Disposition: A | Discharge status: Home / Self Care | Payer: PPO | Source: Ambulatory Visit | Attending: Specialist | Admitting: Specialist

## 2022-06-19 DIAGNOSIS — Z471 Aftercare following joint replacement surgery: Secondary | ICD-10-CM | POA: Diagnosis not present

## 2022-06-19 DIAGNOSIS — Z96651 Presence of right artificial knee joint: Secondary | ICD-10-CM | POA: Diagnosis not present

## 2022-06-19 MED ORDER — TECHNETIUM TC 99M MEDRONATE IV KIT
20.7000 | PACK | Freq: Once | INTRAVENOUS | Status: AC
  Administered 2022-06-19: 20.7 via INTRAVENOUS

## 2022-06-22 DIAGNOSIS — T8484XD Pain due to internal orthopedic prosthetic devices, implants and grafts, subsequent encounter: Secondary | ICD-10-CM | POA: Diagnosis not present

## 2022-06-23 DIAGNOSIS — Z96651 Presence of right artificial knee joint: Secondary | ICD-10-CM | POA: Diagnosis not present

## 2022-07-08 ENCOUNTER — Ambulatory Visit
Admission: RE | Admit: 2022-07-08 | Discharge: 2022-07-08 | Disposition: A | Discharge status: Home / Self Care | Payer: PPO | Source: Ambulatory Visit | Attending: Gastroenterology | Admitting: Gastroenterology

## 2022-07-08 DIAGNOSIS — K862 Cyst of pancreas: Secondary | ICD-10-CM | POA: Diagnosis not present

## 2022-07-08 MED ORDER — GADOPICLENOL 0.5 MMOL/ML IV SOLN
7.0000 mL | Freq: Once | INTRAVENOUS | Status: AC | PRN
  Administered 2022-07-08: 7 mL via INTRAVENOUS

## 2022-07-11 ENCOUNTER — Other Ambulatory Visit (HOSPITAL_COMMUNITY): Payer: Self-pay | Admitting: Orthopedic Surgery

## 2022-07-11 DIAGNOSIS — Z96651 Presence of right artificial knee joint: Secondary | ICD-10-CM | POA: Diagnosis not present

## 2022-07-11 DIAGNOSIS — Z96659 Presence of unspecified artificial knee joint: Secondary | ICD-10-CM

## 2022-07-18 DIAGNOSIS — H9193 Unspecified hearing loss, bilateral: Secondary | ICD-10-CM | POA: Diagnosis not present

## 2022-07-18 DIAGNOSIS — H903 Sensorineural hearing loss, bilateral: Secondary | ICD-10-CM | POA: Diagnosis not present

## 2022-07-18 DIAGNOSIS — R591 Generalized enlarged lymph nodes: Secondary | ICD-10-CM | POA: Diagnosis not present

## 2022-07-18 DIAGNOSIS — H9313 Tinnitus, bilateral: Secondary | ICD-10-CM | POA: Diagnosis not present

## 2022-08-01 ENCOUNTER — Inpatient Hospital Stay: Payer: PPO | Attending: Hematology & Oncology

## 2022-08-01 ENCOUNTER — Inpatient Hospital Stay: Payer: PPO | Admitting: Medical Oncology

## 2022-08-03 ENCOUNTER — Ambulatory Visit (HOSPITAL_COMMUNITY)
Admission: RE | Admit: 2022-08-03 | Discharge: 2022-08-03 | Disposition: A | Discharge status: Home / Self Care | Payer: PPO | Source: Ambulatory Visit | Attending: Orthopedic Surgery | Admitting: Orthopedic Surgery

## 2022-08-03 DIAGNOSIS — M25561 Pain in right knee: Secondary | ICD-10-CM | POA: Diagnosis not present

## 2022-08-03 DIAGNOSIS — Z96659 Presence of unspecified artificial knee joint: Secondary | ICD-10-CM | POA: Diagnosis not present

## 2022-08-12 ENCOUNTER — Other Ambulatory Visit: Payer: Self-pay | Admitting: Cardiovascular Disease

## 2022-08-12 DIAGNOSIS — R002 Palpitations: Secondary | ICD-10-CM

## 2022-09-07 DIAGNOSIS — K7460 Unspecified cirrhosis of liver: Secondary | ICD-10-CM | POA: Diagnosis not present

## 2022-09-07 DIAGNOSIS — K862 Cyst of pancreas: Secondary | ICD-10-CM | POA: Diagnosis not present

## 2022-09-07 DIAGNOSIS — K5900 Constipation, unspecified: Secondary | ICD-10-CM | POA: Diagnosis not present

## 2022-09-07 DIAGNOSIS — Z1211 Encounter for screening for malignant neoplasm of colon: Secondary | ICD-10-CM | POA: Diagnosis not present

## 2022-09-07 DIAGNOSIS — D6861 Antiphospholipid syndrome: Secondary | ICD-10-CM | POA: Diagnosis not present

## 2022-09-14 ENCOUNTER — Ambulatory Visit
Admission: RE | Admit: 2022-09-14 | Discharge: 2022-09-14 | Disposition: A | Discharge status: Home / Self Care | Payer: PPO | Source: Ambulatory Visit | Attending: Gastroenterology | Admitting: Gastroenterology

## 2022-09-14 ENCOUNTER — Other Ambulatory Visit: Payer: Self-pay | Admitting: Gastroenterology

## 2022-09-14 DIAGNOSIS — K5900 Constipation, unspecified: Secondary | ICD-10-CM | POA: Diagnosis not present

## 2022-09-14 DIAGNOSIS — R109 Unspecified abdominal pain: Secondary | ICD-10-CM | POA: Diagnosis not present

## 2022-09-27 DIAGNOSIS — Z1231 Encounter for screening mammogram for malignant neoplasm of breast: Secondary | ICD-10-CM | POA: Diagnosis not present

## 2022-09-27 LAB — HM MAMMOGRAPHY

## 2022-09-28 ENCOUNTER — Inpatient Hospital Stay (HOSPITAL_BASED_OUTPATIENT_CLINIC_OR_DEPARTMENT_OTHER): Payer: PPO | Admitting: Medical Oncology

## 2022-09-28 ENCOUNTER — Inpatient Hospital Stay: Payer: PPO | Attending: Hematology & Oncology

## 2022-09-28 ENCOUNTER — Encounter: Payer: Self-pay | Admitting: Medical Oncology

## 2022-09-28 VITALS — BP 160/67 | HR 62 | Temp 98.0°F | Resp 20 | Ht 63.5 in | Wt 143.8 lb

## 2022-09-28 DIAGNOSIS — Z86718 Personal history of other venous thrombosis and embolism: Secondary | ICD-10-CM | POA: Insufficient documentation

## 2022-09-28 DIAGNOSIS — Z7901 Long term (current) use of anticoagulants: Secondary | ICD-10-CM | POA: Diagnosis not present

## 2022-09-28 DIAGNOSIS — Z794 Long term (current) use of insulin: Secondary | ICD-10-CM | POA: Diagnosis not present

## 2022-09-28 DIAGNOSIS — D6861 Antiphospholipid syndrome: Secondary | ICD-10-CM | POA: Diagnosis not present

## 2022-09-28 DIAGNOSIS — Z79899 Other long term (current) drug therapy: Secondary | ICD-10-CM | POA: Insufficient documentation

## 2022-09-28 DIAGNOSIS — K7581 Nonalcoholic steatohepatitis (NASH): Secondary | ICD-10-CM | POA: Insufficient documentation

## 2022-09-28 DIAGNOSIS — E11649 Type 2 diabetes mellitus with hypoglycemia without coma: Secondary | ICD-10-CM | POA: Insufficient documentation

## 2022-09-28 LAB — CMP (CANCER CENTER ONLY)
ALT: 13 U/L (ref 0–44)
AST: 20 U/L (ref 15–41)
Albumin: 4.4 g/dL (ref 3.5–5.0)
Alkaline Phosphatase: 86 U/L (ref 38–126)
Anion gap: 9 (ref 5–15)
BUN: 17 mg/dL (ref 8–23)
CO2: 32 mmol/L (ref 22–32)
Calcium: 10.5 mg/dL — ABNORMAL HIGH (ref 8.9–10.3)
Chloride: 106 mmol/L (ref 98–111)
Creatinine: 0.86 mg/dL (ref 0.44–1.00)
GFR, Estimated: >60 mL/min (ref >60)
Glucose, Bld: 171 mg/dL — ABNORMAL HIGH (ref 70–99)
Potassium: 4.9 mmol/L (ref 3.5–5.1)
Sodium: 147 mmol/L — ABNORMAL HIGH (ref 135–145)
Total Bilirubin: 1.6 mg/dL — ABNORMAL HIGH (ref 0.3–1.2)
Total Protein: 7.3 g/dL (ref 6.5–8.1)

## 2022-09-28 LAB — CBC WITH DIFFERENTIAL (CANCER CENTER ONLY)
Abs Immature Granulocytes: 0.02 10*3/uL (ref 0.00–0.07)
Basophils Absolute: 0.1 10*3/uL (ref 0.0–0.1)
Basophils Relative: 1 %
Eosinophils Absolute: 0.3 10*3/uL (ref 0.0–0.5)
Eosinophils Relative: 4 %
HCT: 44 % (ref 36.0–46.0)
Hemoglobin: 14.8 g/dL (ref 12.0–15.0)
Immature Granulocytes: 0 %
Lymphocytes Relative: 30 %
Lymphs Abs: 2 10*3/uL (ref 0.7–4.0)
MCH: 32.9 pg (ref 26.0–34.0)
MCHC: 33.6 g/dL (ref 30.0–36.0)
MCV: 97.8 fL (ref 80.0–100.0)
Monocytes Absolute: 0.7 10*3/uL (ref 0.1–1.0)
Monocytes Relative: 11 %
Neutro Abs: 3.5 10*3/uL (ref 1.7–7.7)
Neutrophils Relative %: 54 %
Platelet Count: 337 10*3/uL (ref 150–400)
RBC: 4.5 MIL/uL (ref 3.87–5.11)
RDW: 12.6 % (ref 11.5–15.5)
WBC Count: 6.6 10*3/uL (ref 4.0–10.5)
nRBC: 0 % (ref 0.0–0.2)

## 2022-09-28 LAB — LACTATE DEHYDROGENASE: LDH: 206 U/L — ABNORMAL HIGH (ref 98–192)

## 2022-09-28 NOTE — Progress Notes (Signed)
Hematology and Oncology Follow Up Visit  Laurie Green [id number] June 20, 1945 77 y.o. 09/28/2022   Principle Diagnosis:  1. Antiphospholipid antibody syndrome. 2. History of recurrent lower extremity deep venous thrombosis. 3. Nonalcoholic steatohepatitis.  Current Therapy:   Coumadin - 7.5 mg po q day -  to maintain an INR between 2-3. -- d/c due to lack of availability  Eliquis 5 mg po BID -- started on 11/21/2018     Interim History:  Ms.  Green is back for followup.    Other than her chronic right knee pain she is doing well. She is considering another knee surgery but is unsure. The last recovery was challenging on her.   She is doing well with her Eliquis. No bleeding or bruising.  There is no change in bowel or bladder habits. No SOB, chest pain, calf pain or leg swelling.   She does have diabetes and is followed by her PCP. She reports that she had an episodes of mild hypoglycemia this morning and she over corrected a bit so this is why her glucose is a bit higher.   Has also has NASH for which she continues to avoid tylenol but 1-2 times per week and does not drink ETOH. She is followed by GI.   Currently, I would have to say that her performance status is probably ECOG 1.       Wt Readings from Last 3 Encounters:  09/28/22 143 lb 12.8 oz (65.2 kg)  05/03/22 140 lb (63.5 kg)  05/02/22 143 lb (64.9 kg)    Medications:  Current Outpatient Medications:    acetaminophen (TYLENOL) 500 MG tablet, Take 1,000 mg by mouth every 6 (six) hours as needed for moderate pain., Disp: , Rfl:    apixaban (ELIQUIS) 5 MG TABS tablet, Take 1 tablet (5 mg total) by mouth 2 (two) times daily., Disp: 60 tablet, Rfl: 6   benazepril (LOTENSIN) 40 MG tablet, Take 40 mg by mouth daily., Disp: , Rfl:    LANTUS SOLOSTAR 100 UNIT/ML Solostar Pen, Inject 24 Units into the skin at bedtime. (Patient taking differently: Inject 10-12 Units into the skin See admin instructions. Inject 0-12 units into  the skin in the morning and 12 units at bedtime.), Disp: 15 mL, Rfl: 1   meclizine (ANTIVERT) 25 MG tablet, Take 25 mg by mouth 2 (two) times daily as needed., Disp: , Rfl:    Misc Natural Products (BLOOD SUGAR BALANCE) TABS, Take 1 tablet by mouth daily as needed (low blood sugar). Glucofort, Disp: , Rfl:    neomycin-bacitracin-polymyxin (NEOSPORIN) OINT, Apply 1 Application topically as needed (dry nose)., Disp: , Rfl:    pantoprazole (PROTONIX) 40 MG tablet, Take 40 mg by mouth daily., Disp: , Rfl:    propranolol ER (INDERAL LA) 80 MG 24 hr capsule, Take 1 capsule (80 mg total) by mouth in the morning., Disp: 30 capsule, Rfl: 1   PREMARIN vaginal cream, PLACE 1 APPLICATORFUL VAGINALLY 2 TIMES A WEEK. MONDAY AND WEDNESDAY OR THURSDAY. (Patient not taking: Reported on 05/01/2022), Disp: 30 g, Rfl: 3   propranolol (INDERAL) 10 MG tablet, TAKE 1 TABLET (10 MG TOTAL) BY MOUTH 4 (FOUR) TIMES DAILY. AS NEEDED (Patient not taking: Reported on 09/28/2022), Disp: 360 tablet, Rfl: 2   ZOFRAN 4 MG tablet, Take 1 tablet every 6 hours by oral route as needed for 10 days. (Patient not taking: Reported on 09/28/2022), Disp: , Rfl:   Allergies:  Allergies  Allergen Reactions   Lidocaine Hcl Anaphylaxis  Piperacillin Sod-Tazobactam So Itching   Cyclosporine Other (See Comments)    burns   Hydromorphone Hcl Nausea And Vomiting    Can take with zofran   Morphine And Codeine Nausea And Vomiting   Percocet [Oxycodone-Acetaminophen] Nausea And Vomiting    Past Medical History, Surgical history, Social history, and Family History were reviewed and updated.  Review of Systems: Review of Systems  Constitutional:  Negative for malaise/fatigue.  HENT: Negative.    Eyes: Negative.   Respiratory: Negative.    Cardiovascular: Negative.   Gastrointestinal:  Negative for abdominal pain and diarrhea.  Genitourinary: Negative.   Musculoskeletal:  Positive for myalgias.  Skin: Negative.   Neurological: Negative.    Endo/Heme/Allergies: Negative.   Psychiatric/Behavioral: Negative.       height is 5' 3.5" (1.613 m) and weight is 143 lb 12.8 oz (65.2 kg). Her oral temperature is 98 F (36.7 C). Her blood pressure is 160/67 (abnormal) and her pulse is 62. Her respiration is 20 and oxygen saturation is 98%.   Physical Exam Vitals and nursing note reviewed.  Constitutional:      General: She is not in acute distress.    Appearance: Normal appearance. She is not ill-appearing, toxic-appearing or diaphoretic.  HENT:     Head: Normocephalic and atraumatic.  Eyes:     Extraocular Movements: Extraocular movements intact.     Pupils: Pupils are equal, round, and reactive to light.  Cardiovascular:     Rate and Rhythm: Normal rate and regular rhythm.     Heart sounds: Normal heart sounds.  Pulmonary:     Effort: Pulmonary effort is normal.     Breath sounds: Normal breath sounds.  Musculoskeletal:     Cervical back: Neck supple.  Skin:    Coloration: Skin is not pale.     Findings: No bruising or rash.  Neurological:     Mental Status: She is alert.        Lab Results  Component Value Date   WBC 6.6 09/28/2022   HGB 14.8 09/28/2022   HCT 44.0 09/28/2022   MCV 97.8 09/28/2022   PLT 337 09/28/2022     Chemistry      Component Value Date/Time   NA 147 (H) 09/28/2022 1253   NA 144 04/04/2017 1139   NA 140 09/07/2016 0946   K 4.9 09/28/2022 1253   K 3.5 04/04/2017 1139   K 4.1 09/07/2016 0946   CL 106 09/28/2022 1253   CL 103 04/04/2017 1139   CO2 32 09/28/2022 1253   CO2 33 04/04/2017 1139   CO2 28 09/07/2016 0946   BUN 17 09/28/2022 1253   BUN 15 04/04/2017 1139   BUN 22.6 09/07/2016 0946   CREATININE 0.86 09/28/2022 1253   CREATININE 0.9 04/04/2017 1139   CREATININE 0.8 09/07/2016 0946      Component Value Date/Time   CALCIUM 10.5 (H) 09/28/2022 1253   CALCIUM 9.5 04/04/2017 1139   CALCIUM 9.6 09/07/2016 0946   ALKPHOS 86 09/28/2022 1253   ALKPHOS 93 (H) 04/04/2017 1139    ALKPHOS 81 09/07/2016 0946   AST 20 09/28/2022 1253   AST 32 09/07/2016 0946   ALT 13 09/28/2022 1253   ALT 36 04/04/2017 1139   ALT 33 09/07/2016 0946   BILITOT 1.6 (H) 09/28/2022 1253   BILITOT 1.34 (H) 09/07/2016 0946       Impression and Plan: Laurie Green is 77 year old white female with the anti-phospholipid antibody syndrome. This really has not been a problem  for her thankfully. The bigger issue has been the hepatic problems and her diabetes. She has steatosis. She has NASH.  Today she is doing well from our standpoint. CBC/CMP reviewed. As always, I recommend continued follow up with cardiology, GI and her PCP. In terms of her anti-phospholipid antibody syndrome, she is doing well. She will continue her Eliquis at this time.   Disposition RTC 6 months MD, labs (CBC w/, CMP, LDH)  Brand Males [place], [place] 6/13/20243:10 PM

## 2022-10-04 DIAGNOSIS — R92331 Mammographic heterogeneous density, right breast: Secondary | ICD-10-CM | POA: Diagnosis not present

## 2022-10-04 DIAGNOSIS — R922 Inconclusive mammogram: Secondary | ICD-10-CM | POA: Diagnosis not present

## 2022-11-13 DIAGNOSIS — Z6826 Body mass index (BMI) 26.0-26.9, adult: Secondary | ICD-10-CM | POA: Diagnosis not present

## 2022-11-13 DIAGNOSIS — M1991 Primary osteoarthritis, unspecified site: Secondary | ICD-10-CM | POA: Diagnosis not present

## 2022-11-13 DIAGNOSIS — E663 Overweight: Secondary | ICD-10-CM | POA: Diagnosis not present

## 2022-11-13 DIAGNOSIS — M25561 Pain in right knee: Secondary | ICD-10-CM | POA: Diagnosis not present

## 2022-11-13 DIAGNOSIS — D6861 Antiphospholipid syndrome: Secondary | ICD-10-CM | POA: Diagnosis not present

## 2022-11-13 DIAGNOSIS — R5382 Chronic fatigue, unspecified: Secondary | ICD-10-CM | POA: Diagnosis not present

## 2022-11-13 DIAGNOSIS — M545 Low back pain, unspecified: Secondary | ICD-10-CM | POA: Diagnosis not present

## 2022-11-13 DIAGNOSIS — M797 Fibromyalgia: Secondary | ICD-10-CM | POA: Diagnosis not present

## 2022-11-23 DIAGNOSIS — E113293 Type 2 diabetes mellitus with mild nonproliferative diabetic retinopathy without macular edema, bilateral: Secondary | ICD-10-CM | POA: Diagnosis not present

## 2022-12-04 DIAGNOSIS — D6861 Antiphospholipid syndrome: Secondary | ICD-10-CM | POA: Diagnosis not present

## 2022-12-04 DIAGNOSIS — M545 Low back pain, unspecified: Secondary | ICD-10-CM | POA: Diagnosis not present

## 2022-12-04 DIAGNOSIS — Z6825 Body mass index (BMI) 25.0-25.9, adult: Secondary | ICD-10-CM | POA: Diagnosis not present

## 2022-12-04 DIAGNOSIS — M25561 Pain in right knee: Secondary | ICD-10-CM | POA: Diagnosis not present

## 2022-12-04 DIAGNOSIS — M1991 Primary osteoarthritis, unspecified site: Secondary | ICD-10-CM | POA: Diagnosis not present

## 2022-12-04 DIAGNOSIS — M797 Fibromyalgia: Secondary | ICD-10-CM | POA: Diagnosis not present

## 2022-12-04 DIAGNOSIS — E663 Overweight: Secondary | ICD-10-CM | POA: Diagnosis not present

## 2023-01-16 DIAGNOSIS — Z96659 Presence of unspecified artificial knee joint: Secondary | ICD-10-CM | POA: Diagnosis not present

## 2023-01-16 DIAGNOSIS — Z96651 Presence of right artificial knee joint: Secondary | ICD-10-CM | POA: Diagnosis not present

## 2023-01-16 DIAGNOSIS — M1712 Unilateral primary osteoarthritis, left knee: Secondary | ICD-10-CM | POA: Diagnosis not present

## 2023-01-16 DIAGNOSIS — Z471 Aftercare following joint replacement surgery: Secondary | ICD-10-CM | POA: Diagnosis not present

## 2023-01-17 ENCOUNTER — Telehealth: Payer: Self-pay | Admitting: Cardiovascular Disease

## 2023-01-17 ENCOUNTER — Telehealth: Payer: Self-pay

## 2023-01-17 NOTE — Telephone Encounter (Signed)
Pt is scheduled for tele on 10/18 at 2:20pm. Med rec and consent done    Patient Consent for Virtual Visit        Laurie Green has provided verbal consent on 01/17/2023 for a virtual visit (video or telephone).   CONSENT FOR VIRTUAL VISIT FOR:  Laurie Green  By participating in this virtual visit I agree to the following:  I hereby voluntarily request, consent and authorize [facility] and its employed or contracted physicians, physician assistants, nurse practitioners or other licensed health care professionals (the Practitioner), to provide me with telemedicine health care services (the "Services") as deemed necessary by the treating Practitioner. I acknowledge and consent to receive the Services by the Practitioner via telemedicine. I understand that the telemedicine visit will involve communicating with the Practitioner through live audiovisual communication technology and the disclosure of certain medical information by electronic transmission. I acknowledge that I have been given the opportunity to request an in-person assessment or other available alternative prior to the telemedicine visit and am voluntarily participating in the telemedicine visit.  I understand that I have the right to withhold or withdraw my consent to the use of telemedicine in the course of my care at any time, without affecting my right to future care or treatment, and that the Practitioner or I may terminate the telemedicine visit at any time. I understand that I have the right to inspect all information obtained and/or recorded in the course of the telemedicine visit and may receive copies of available information for a reasonable fee.  I understand that some of the potential risks of receiving the Services via telemedicine include:  Delay or interruption in medical evaluation due to technological equipment failure or disruption; Information transmitted may not be sufficient (e.g. poor resolution of  images) to allow for appropriate medical decision making by the Practitioner; and/or  In rare instances, security protocols could fail, causing a breach of personal health information.  Furthermore, I acknowledge that it is my responsibility to provide information about my medical history, conditions and care that is complete and accurate to the best of my ability. I acknowledge that Practitioner's advice, recommendations, and/or decision may be based on factors not within their control, such as incomplete or inaccurate data provided by me or distortions of diagnostic images or specimens that may result from electronic transmissions. I understand that the practice of medicine is not an exact science and that Practitioner makes no warranties or guarantees regarding treatment outcomes. I acknowledge that a copy of this consent can be made available to me via my patient portal [facility] MyChart), or I can request a printed copy by calling the office of [facility].    I understand that my insurance will be billed for this visit.   I have read or had this consent read to me. I understand the contents of this consent, which adequately explains the benefits and risks of the Services being provided via telemedicine.  I have been provided ample opportunity to ask questions regarding this consent and the Services and have had my questions answered to my satisfaction. I give my informed consent for the services to be provided through the use of telemedicine in my medical care

## 2023-01-17 NOTE — Telephone Encounter (Signed)
   Name: Laurie Green  DOB: 01/22/1946  MRN: [id number]  Primary Cardiologist: Kristeen Miss, MD   Preoperative team, please contact this patient and set up a phone call appointment for further preoperative risk assessment. Please obtain consent and complete medication review. Thank you for your help.  I confirm that guidance regarding antiplatelet and oral anticoagulation therapy has been completed and, if necessary, noted below.  Pt on Eliquis for history of recurrent DVTs and antiphospholipid syndrome. Med being prescribed by Dr Myna Hidalgo. Recommend clearance come from managing provider.   I also confirmed the patient resides in the state of [place]. As per [facility] Medical Board telemedicine laws, the patient must reside in the state in which the provider is licensed.   Napoleon Form, Leodis Rains, NP 01/17/2023, 12:35 PM [facility]

## 2023-01-17 NOTE — Telephone Encounter (Signed)
Pt is scheduled for tele on 10/18 at 2:20pm. Med rec and consent done

## 2023-01-17 NOTE — Telephone Encounter (Signed)
   Pre-operative Risk Assessment    Patient Name: Laurie Green  DOB: [birth year] MRN: [id number]  Last visit 04/26/22 Next visit None      Request for Surgical Clearance    Procedure:   Revision Right total knee arthroplasty  Date of Surgery:  Clearance TBD                                 Surgeon:  Dr. Samson Frederic Surgeon's Group or Practice Name:  [facility] Phone number:  [phone]  Schedular: Eppie Gibson Fax number:  [phone]   Type of Clearance Requested:   Medical and Pharmacy clearance Eliquis   Type of Anesthesia:  Spinal   Additional requests/questions:    SignedRoyann Shivers   01/17/2023, 10:47 AM

## 2023-01-17 NOTE — Telephone Encounter (Signed)
Pharmacy please advise on holding Eliquis prior to revision of right total knee arthroplasty scheduled for TBD. Thank you.

## 2023-01-17 NOTE — Telephone Encounter (Signed)
Pt on Eliquis for history of recurrent DVTs and antiphospholipid syndrome. Med being prescribed by Dr Myna Hidalgo. Recommend clearance come from managing provider.

## 2023-01-23 DIAGNOSIS — E1165 Type 2 diabetes mellitus with hyperglycemia: Secondary | ICD-10-CM | POA: Diagnosis not present

## 2023-01-23 DIAGNOSIS — Z794 Long term (current) use of insulin: Secondary | ICD-10-CM | POA: Diagnosis not present

## 2023-01-23 DIAGNOSIS — G8929 Other chronic pain: Secondary | ICD-10-CM | POA: Diagnosis not present

## 2023-01-23 DIAGNOSIS — I10 Essential (primary) hypertension: Secondary | ICD-10-CM | POA: Diagnosis not present

## 2023-01-23 DIAGNOSIS — M25561 Pain in right knee: Secondary | ICD-10-CM | POA: Diagnosis not present

## 2023-01-24 ENCOUNTER — Telehealth (HOSPITAL_COMMUNITY): Payer: Self-pay | Admitting: *Deleted

## 2023-01-24 NOTE — Telephone Encounter (Signed)
Received fax request from Dr Samson Frederic requesting IVC filter placement by Dr. Randie Heinz prior to right total knee revision.  Will give to [facility]

## 2023-01-26 NOTE — Telephone Encounter (Addendum)
Per Kerri/Dr. Linna Caprice office, she will contact our office to arrange IVC filter placement once she is cleared by cardiology.

## 2023-02-02 ENCOUNTER — Ambulatory Visit: Payer: PPO | Attending: Physician Assistant

## 2023-02-02 DIAGNOSIS — Z01810 Encounter for preprocedural cardiovascular examination: Secondary | ICD-10-CM | POA: Diagnosis not present

## 2023-02-02 NOTE — Progress Notes (Signed)
Virtual Visit via Telephone Note   Because of Laurie Green co-morbid illnesses, she is at least at moderate risk for complications without adequate follow up.  This format is felt to be most appropriate for this patient at this time.  The patient did not have access to video technology/had technical difficulties with video requiring transitioning to audio format only (telephone).  All issues noted in this document were discussed and addressed.  No physical exam could be performed with this format.  Please refer to the patient's chart for her consent to telehealth for  [facility] Center.  Evaluation Performed:  Preoperative cardiovascular risk assessment _____________   Date:  02/02/2023   Patient ID:  Laurie Green, DOB [birth year], MRN [id number] Patient Location:  Home Provider location:   Office  Primary Care Provider:  Camie Patience, FNP Primary Cardiologist:  Kristeen Miss, MD  Chief Complaint / Patient Profile   77 y.o. y/o female with a h/o anticardiolipin antibody / hypercoagulable syndrome (sees Dr. Myna Hidalgo) , DM, murmur, and TIA  who is pending revision of a right total knee arthoplasty and presents today for telephonic preoperative cardiovascular risk assessment.  History of Present Illness    Laurie Green is a 77 y.o. female who presents via audio/video conferencing for a telehealth visit today.  Pt was last seen in cardiology clinic on 04/26/22 by Nahser.  At that time Laurie Green was doing well .  The patient is now pending procedure as outlined above. Since her last visit, she tells me she has been doing great from a heart standpoint.  No chest pain or shortness of breath.  She tries to walk even though she is in a lot of pain from her knee.  Goes up and down a full flight of stairs several times a day in her home.  She is hopeful that the surgery will be early next month but unsure of timing because she needs to get a filter for her recurrent DVTs first.   She does meet 4 METS on the DASI.   Pt on Eliquis for history of recurrent DVTs and antiphospholipid syndrome. Med being prescribed by Dr Myna Hidalgo. Recommend clearance come from managing provider.    Past Medical History    Past Medical History:  Diagnosis Date   Anginal pain (HCC)    Anti-cardiolipin antibody syndrome (HCC)    antiphospholipid antibody syndrome   Antiphospholipid antibody with hypercoagulable state (HCC) 11/07/2012   Arthritis    Asthma    well controlled, no rescue inhaler used   Blood dyscrasia    anticardiolipid syndrome   Blood transfusion    age 32 yr old-none since   Celiac disease    Diabetes mellitus    Type II   Diverticulitis    DVT (deep venous thrombosis) (HCC) 05/01/2011   "to liver"   Family history of adverse reaction to anesthesia    N&V   Fibromyalgia    GERD (gastroesophageal reflux disease)    11/01/16- not problem   H/O seasonal allergies    Heart murmur    "not a problem"   Hypertension    NASH (nonalcoholic steatohepatitis) 05/01/2011   Nausea & vomiting    11-17-14 "nausea and vomiting after meals" at present discussed with Dr. Randa Evens office per pt.   Pneumonia 2021   PONV (postoperative nausea and vomiting)    'I dont have it if they give me something in the IV."   Portal hypertension (HCC)  Stroke (HCC)    tia- eye,   TIA (transient ischemic attack)    Varices 05/01/2011   Varices, esophageal (HCC)    Varices, gastric    Past Surgical History:  Procedure Laterality Date   ABDOMINAL HYSTERECTOMY     ANKLE FRACTURE SURGERY Left    retained hardware   BIOPSY  05/03/2022   Procedure: BIOPSY;  Surgeon: Willis Modena, MD;  Location: [facility];  Service: Gastroenterology;;   CHOLECYSTECTOMY     COLON SURGERY     hx. diverticulitis   COLONOSCOPY N/A 11/23/2014   Procedure: COLONOSCOPY;  Surgeon: Carman Ching, MD;  Location: [facility];  Service: Endoscopy;  Laterality: N/A;   ESOPHAGOGASTRODUODENOSCOPY  04/28/2011    Procedure: ESOPHAGOGASTRODUODENOSCOPY (EGD);  Surgeon: Vertell Novak., MD;  Location: [facility] ENDOSCOPY;  Service: Endoscopy;  Laterality: N/A;   ESOPHAGOGASTRODUODENOSCOPY N/A 11/23/2014   Procedure: ESOPHAGOGASTRODUODENOSCOPY (EGD);  Surgeon: Carman Ching, MD;  Location: [facility];  Service: Endoscopy;  Laterality: N/A;   ESOPHAGOGASTRODUODENOSCOPY (EGD) WITH PROPOFOL N/A 11/06/2016   Procedure: ESOPHAGOGASTRODUODENOSCOPY (EGD) WITH PROPOFOL;  Surgeon: Carman Ching, MD;  Location: [facility] ENDOSCOPY;  Service: Endoscopy;  Laterality: N/A;   ESOPHAGOGASTRODUODENOSCOPY (EGD) WITH PROPOFOL Bilateral 05/03/2022   Procedure: ESOPHAGOGASTRODUODENOSCOPY (EGD) WITH PROPOFOL;  Surgeon: Willis Modena, MD;  Location: [facility];  Service: Gastroenterology;  Laterality: Bilateral;   EUS N/A 09/25/2012   Procedure: ESOPHAGEAL ENDOSCOPIC ULTRASOUND (EUS) RADIAL;  Surgeon: Willis Modena, MD;  Location: [facility];  Service: Endoscopy;  Laterality: N/A;   HERNIA REPAIR     luq incisional   IVC FILTER INSERTION N/A 07/19/2020   Procedure: IVC FILTER INSERTION;  Surgeon: Maeola Harman, MD;  Location: [facility] INVASIVE CV LAB;  Service: Cardiovascular;  Laterality: N/A;   IVC FILTER REMOVAL N/A 12/12/2021   Procedure: IVC FILTER REMOVAL;  Surgeon: Maeola Harman, MD;  Location[facility] INVASIVE CV LAB;  Service: Cardiovascular;  Laterality: N/A;   IVC VENOGRAPHY N/A 12/12/2021   Procedure: IVC Venography;  Surgeon: Maeola Harman, MD;  Location[facility]VAS[facility] CV LAB;  Service: Cardiovascular;  Laterality: N/A;   SPLENECTOMY, TOTAL     TONSILLECTOMY     TOTAL KNEE ARTHROPLASTY Right 07/21/2020   Procedure: TOTAL KNEE ARTHROPLASTY;  Surgeon: Jene Every, MD;  Location: [facility] ORS;  Service: Orthopedics;  Laterality: Right;  2.5 hrs    Allergies  Allergies  Allergen Reactions   Lidocaine Hcl Anaphylaxis   Piperacillin Sod-Tazobactam So Itching   Cyclosporine Other (See Comments)    burns    Hydromorphone Hcl Nausea And Vomiting    Can take with zofran   Morphine And Codeine Nausea And Vomiting   Percocet [Oxycodone-Acetaminophen] Nausea And Vomiting    Home Medications    Prior to Admission medications   Medication Sig Start Date End Date Taking? Authorizing Provider  acetaminophen (TYLENOL) 500 MG tablet Take 1,000 mg by mouth every 6 (six) hours as needed for moderate pain.    [provider]  apixaban (ELIQUIS) 5 MG TABS tablet Take 1 tablet (5 mg total) by mouth 2 (two) times daily. 02/10/22   Josph Macho, MD  benazepril (LOTENSIN) 40 MG tablet Take 40 mg by mouth daily.    [provider]  LANTUS SOLOSTAR 100 UNIT/ML Solostar Pen Inject 24 Units into the skin at bedtime. Patient taking differently: Inject 10-12 Units into the skin See admin instructions. Inject 0-12 units into the skin in the morning and 12 units at bedtime. 02/15/21   Josph Macho, MD  meclizine (ANTIVERT) 25 MG tablet Take 25 mg by mouth 2 (two) times daily as needed. 05/31/22   [provider]  Misc Natural Products (BLOOD SUGAR BALANCE) TABS Take 1 tablet by mouth daily as needed (low blood sugar). Glucofort    [provider]  neomycin-bacitracin-polymyxin (NEOSPORIN) OINT Apply 1 Application topically as needed (dry nose).    [provider]  pantoprazole (PROTONIX) 40 MG tablet Take 40 mg by mouth daily. 11/14/21   [provider]  PREMARIN vaginal cream PLACE 1 APPLICATORFUL VAGINALLY 2 TIMES A WEEK. MONDAY AND WEDNESDAY OR THURSDAY. 06/07/20   Josph Macho, MD  propranolol (INDERAL) 10 MG tablet TAKE 1 TABLET (10 MG TOTAL) BY MOUTH 4 (FOUR) TIMES DAILY. AS NEEDED 08/14/22   Nahser, Deloris Ping, MD  propranolol ER (INDERAL LA) 80 MG 24 hr capsule Take 1 capsule (80 mg total) by mouth in the morning. 02/15/21   Josph Macho, MD  ZOFRAN 4 MG tablet  06/21/22   [provider]    Physical Exam    Vital Signs:  Carmela Rima does  not have vital signs available for review today.  Given telephonic nature of communication, physical exam is limited. AAOx3. NAD. Normal affect.  Speech and respirations are unlabored.  Accessory Clinical Findings    None  Assessment & Plan    1.  Preoperative Cardiovascular Risk Assessment:  Ms. Laurie Green perioperative risk of a major cardiac event is 0.9% according to the Revised Cardiac Risk Index (RCRI).  Therefore, she is at low risk for perioperative complications.   Her functional capacity is good at 5.62 METs according to the Duke Activity Status Index (DASI). Recommendations: According to ACC/AHA guidelines, no further cardiovascular testing needed.  The patient may proceed to surgery at acceptable risk.   Antiplatelet and/or Anticoagulation Recommendations:  Eliquis holding parameters should come from prescribing physician   The patient was advised that if she develops new symptoms prior to surgery to contact our office to arrange for a follow-up visit, and she verbalized understanding.   A copy of this note will be routed to requesting surgeon.  Time:   Today, I have spent 9 minutes with the patient with telehealth technology discussing medical history, symptoms, and management plan.     Sharlene Dory, PA-C  02/02/2023, 2:22 PM

## 2023-02-06 ENCOUNTER — Telehealth: Payer: Self-pay

## 2023-02-06 ENCOUNTER — Other Ambulatory Visit: Payer: Self-pay

## 2023-02-06 DIAGNOSIS — Z86718 Personal history of other venous thrombosis and embolism: Secondary | ICD-10-CM

## 2023-02-06 NOTE — Telephone Encounter (Signed)
Attempted to reach pt to schedule IVC filter insertion. LVM asking that she return our call.

## 2023-02-07 ENCOUNTER — Encounter (HOSPITAL_BASED_OUTPATIENT_CLINIC_OR_DEPARTMENT_OTHER): Payer: Self-pay | Admitting: Emergency Medicine

## 2023-02-07 ENCOUNTER — Emergency Department (HOSPITAL_BASED_OUTPATIENT_CLINIC_OR_DEPARTMENT_OTHER): Payer: PPO | Admitting: Radiology

## 2023-02-07 ENCOUNTER — Other Ambulatory Visit: Payer: Self-pay

## 2023-02-07 ENCOUNTER — Emergency Department (HOSPITAL_BASED_OUTPATIENT_CLINIC_OR_DEPARTMENT_OTHER)
Admission: EM | Admit: 2023-02-07 | Discharge: 2023-02-07 | Disposition: A | Discharge status: Home / Self Care | Payer: PPO | Attending: Emergency Medicine | Admitting: Emergency Medicine

## 2023-02-07 ENCOUNTER — Other Ambulatory Visit (HOSPITAL_BASED_OUTPATIENT_CLINIC_OR_DEPARTMENT_OTHER): Payer: Self-pay

## 2023-02-07 ENCOUNTER — Emergency Department (HOSPITAL_BASED_OUTPATIENT_CLINIC_OR_DEPARTMENT_OTHER): Payer: PPO

## 2023-02-07 ENCOUNTER — Encounter: Payer: Self-pay | Admitting: Hematology & Oncology

## 2023-02-07 DIAGNOSIS — Z7901 Long term (current) use of anticoagulants: Secondary | ICD-10-CM | POA: Diagnosis not present

## 2023-02-07 DIAGNOSIS — R109 Unspecified abdominal pain: Secondary | ICD-10-CM | POA: Insufficient documentation

## 2023-02-07 DIAGNOSIS — Z8673 Personal history of transient ischemic attack (TIA), and cerebral infarction without residual deficits: Secondary | ICD-10-CM | POA: Diagnosis not present

## 2023-02-07 DIAGNOSIS — M25511 Pain in right shoulder: Secondary | ICD-10-CM | POA: Diagnosis not present

## 2023-02-07 DIAGNOSIS — Q600 Renal agenesis, unilateral: Secondary | ICD-10-CM | POA: Diagnosis not present

## 2023-02-07 DIAGNOSIS — J45909 Unspecified asthma, uncomplicated: Secondary | ICD-10-CM | POA: Insufficient documentation

## 2023-02-07 DIAGNOSIS — I2510 Atherosclerotic heart disease of native coronary artery without angina pectoris: Secondary | ICD-10-CM | POA: Diagnosis not present

## 2023-02-07 DIAGNOSIS — R079 Chest pain, unspecified: Secondary | ICD-10-CM | POA: Diagnosis not present

## 2023-02-07 DIAGNOSIS — R1011 Right upper quadrant pain: Secondary | ICD-10-CM | POA: Diagnosis not present

## 2023-02-07 DIAGNOSIS — R9389 Abnormal findings on diagnostic imaging of other specified body structures: Secondary | ICD-10-CM | POA: Diagnosis not present

## 2023-02-07 DIAGNOSIS — Z794 Long term (current) use of insulin: Secondary | ICD-10-CM | POA: Diagnosis not present

## 2023-02-07 DIAGNOSIS — R0989 Other specified symptoms and signs involving the circulatory and respiratory systems: Secondary | ICD-10-CM | POA: Diagnosis not present

## 2023-02-07 DIAGNOSIS — M542 Cervicalgia: Secondary | ICD-10-CM | POA: Insufficient documentation

## 2023-02-07 DIAGNOSIS — R0789 Other chest pain: Secondary | ICD-10-CM | POA: Diagnosis not present

## 2023-02-07 LAB — CBG MONITORING, ED
Glucose-Capillary: 111 mg/dL — ABNORMAL HIGH (ref 70–99)
Glucose-Capillary: 93 mg/dL (ref 70–99)

## 2023-02-07 LAB — BASIC METABOLIC PANEL
Anion gap: 7 (ref 5–15)
BUN: 13 mg/dL (ref 8–23)
CO2: 30 mmol/L (ref 22–32)
Calcium: 9.9 mg/dL (ref 8.9–10.3)
Chloride: 104 mmol/L (ref 98–111)
Creatinine, Ser: 0.71 mg/dL (ref 0.44–1.00)
GFR, Estimated: >60 mL/min (ref >60)
Glucose, Bld: 116 mg/dL — ABNORMAL HIGH (ref 70–99)
Potassium: 4.1 mmol/L (ref 3.5–5.1)
Sodium: 141 mmol/L (ref 135–145)

## 2023-02-07 LAB — TROPONIN I (HIGH SENSITIVITY)
Troponin I (High Sensitivity): 5 ng/L (ref <18)
Troponin I (High Sensitivity): 5 ng/L (ref <18)

## 2023-02-07 LAB — CBC
HCT: 41.6 % (ref 36.0–46.0)
Hemoglobin: 14.4 g/dL (ref 12.0–15.0)
MCH: 33.6 pg (ref 26.0–34.0)
MCHC: 34.6 g/dL (ref 30.0–36.0)
MCV: 97 fL (ref 80.0–100.0)
Platelets: 287 10*3/uL (ref 150–400)
RBC: 4.29 MIL/uL (ref 3.87–5.11)
RDW: 13.3 % (ref 11.5–15.5)
WBC: 11.8 10*3/uL — ABNORMAL HIGH (ref 4.0–10.5)
nRBC: 0 % (ref 0.0–0.2)

## 2023-02-07 MED ORDER — ASPIRIN 81 MG PO CHEW
324.0000 mg | CHEWABLE_TABLET | Freq: Once | ORAL | Status: DC
  Filled 2023-02-07: qty 4

## 2023-02-07 MED ORDER — FENTANYL CITRATE PF 50 MCG/ML IJ SOSY
40.0000 ug | PREFILLED_SYRINGE | Freq: Once | INTRAMUSCULAR | Status: AC
  Administered 2023-02-07: 40 ug via INTRAVENOUS
  Filled 2023-02-07: qty 1

## 2023-02-07 MED ORDER — IOHEXOL 350 MG/ML SOLN
100.0000 mL | Freq: Once | INTRAVENOUS | Status: AC | PRN
  Administered 2023-02-07: 100 mL via INTRAVENOUS

## 2023-02-07 MED ORDER — ONDANSETRON HCL 4 MG/2ML IJ SOLN
4.0000 mg | Freq: Once | INTRAMUSCULAR | Status: AC
  Administered 2023-02-07: 4 mg via INTRAVENOUS
  Filled 2023-02-07: qty 2

## 2023-02-07 MED ORDER — KETOROLAC TROMETHAMINE 15 MG/ML IJ SOLN
15.0000 mg | Freq: Once | INTRAMUSCULAR | Status: AC
  Administered 2023-02-07: 15 mg via INTRAVENOUS
  Filled 2023-02-07: qty 1

## 2023-02-07 NOTE — ED Notes (Signed)
Pt verbalized understanding of d/c instructions and follow up care. Pt wheeled out of the ED via wheelchair.

## 2023-02-07 NOTE — ED Triage Notes (Signed)
Pt c/o RT side shoulder and neck pain, mid sternal CP today

## 2023-02-07 NOTE — ED Notes (Signed)
Dr. Kingsley at bedside 

## 2023-02-07 NOTE — ED Notes (Signed)
BG=[id number]

## 2023-02-07 NOTE — ED Notes (Addendum)
This RN assisted pt with bedpan, well tolerated. Sample at the bedside.

## 2023-02-07 NOTE — ED Notes (Signed)
Patient transported to X-ray 

## 2023-02-07 NOTE — Discharge Instructions (Signed)
You were seen in the emergency department for your abdominal and your chest pain.  Your workup showed no abnormalities within your chest or your abdomen and no signs of infection.  Your shoulder pain could be muscle type pain and your abdominal pain may be related to acid reflux or gastritis however you should follow-up with your cardiologist as you do have risk factors for heart disease to see if you may need further workup done as an outpatient.  You should return to the emergency department if your pain gets significantly worse, you have severe shortness of breath, you have repetitive vomiting or if you have any other new or concerning symptoms.

## 2023-02-07 NOTE — ED Provider Notes (Signed)
[place] [facility]ERGENCY DEPARTMENT AT [facility] Provider Note   CSN: [id number] Arrival date & time: 02/07/23  1307     History  Chief Complaint  Patient presents with   Chest Pain    Laurie Green is a 77 y.o. female.  Patient presents with right shoulder, neck pain and anterior chest pain for the past 2 hours.  Is improved mild symptoms currently.  Patient also developed right abdominal pain.  Patient has a history of TIA, antiphospholipid antibody syndrome, is on anticoagulation.  DVT history.  Patient has history of asthma and celiac disease.  No injuries recently.  No fevers or cough.  The history is provided by the patient.  Chest Pain Associated symptoms: abdominal pain   Associated symptoms: no back pain, no fever, no headache, no shortness of breath and no vomiting        Home Medications Prior to Admission medications   Medication Sig Start Date End Date Taking? Authorizing Provider  acetaminophen (TYLENOL) 500 MG tablet Take 1,000 mg by mouth every 6 (six) hours as needed for moderate pain.    [provider]  apixaban (ELIQUIS) 5 MG TABS tablet Take 1 tablet (5 mg total) by mouth 2 (two) times daily. 02/10/22   Josph Macho, MD  benazepril (LOTENSIN) 40 MG tablet Take 40 mg by mouth daily.    [provider]  LANTUS SOLOSTAR 100 UNIT/ML Solostar Pen Inject 24 Units into the skin at bedtime. Patient taking differently: Inject 10-12 Units into the skin See admin instructions. Inject 0-12 units into the skin in the morning and 12 units at bedtime. 02/15/21   Josph Macho, MD  meclizine (ANTIVERT) 25 MG tablet Take 25 mg by mouth 2 (two) times daily as needed. 05/31/22   [provider]  Misc Natural Products (BLOOD SUGAR BALANCE) TABS Take 1 tablet by mouth daily as needed (low blood sugar). Glucofort    [provider]  neomycin-bacitracin-polymyxin (NEOSPORIN) OINT Apply 1 Application topically as needed (dry nose).     [provider]  pantoprazole (PROTONIX) 40 MG tablet Take 40 mg by mouth daily. 11/14/21   [provider]  PREMARIN vaginal cream PLACE 1 APPLICATORFUL VAGINALLY 2 TIMES A WEEK. MONDAY AND WEDNESDAY OR THURSDAY. 06/07/20   Josph Macho, MD  propranolol (INDERAL) 10 MG tablet TAKE 1 TABLET (10 MG TOTAL) BY MOUTH 4 (FOUR) TIMES DAILY. AS NEEDED 08/14/22   Nahser, Deloris Ping, MD  propranolol ER (INDERAL LA) 80 MG 24 hr capsule Take 1 capsule (80 mg total) by mouth in the morning. 02/15/21   Josph Macho, MD  ZOFRAN 4 MG tablet  06/21/22   [provider]      Allergies    Lidocaine hcl, Piperacillin sod-tazobactam so, Cyclosporine, Hydromorphone hcl, Morphine and codeine, and Percocet [oxycodone-acetaminophen]    Review of Systems   Review of Systems  Constitutional:  Negative for chills and fever.  HENT:  Negative for congestion.   Eyes:  Negative for visual disturbance.  Respiratory:  Negative for shortness of breath.   Cardiovascular:  Positive for chest pain.  Gastrointestinal:  Positive for abdominal pain. Negative for vomiting.  Genitourinary:  Negative for dysuria and flank pain.  Musculoskeletal:  Negative for back pain, neck pain and neck stiffness.  Skin:  Negative for rash.  Neurological:  Negative for light-headedness and headaches.    Physical Exam Updated Vital Signs BP (!) 153/67   Pulse 65   Temp 98.1 F (  36.7 C) (Oral)   Resp 16   Wt 61.2 kg   SpO2 96%   BMI 23.54 kg/m  Physical Exam Vitals and nursing note reviewed.  Constitutional:      General: She is not in acute distress.    Appearance: She is well-developed.  HENT:     Head: Normocephalic and atraumatic.     Mouth/Throat:     Mouth: Mucous membranes are moist.  Eyes:     General:        Right eye: No discharge.        Left eye: No discharge.     Conjunctiva/sclera: Conjunctivae normal.  Neck:     Trachea: No tracheal deviation.  Cardiovascular:     Rate and  Rhythm: Normal rate and regular rhythm.     Heart sounds: No murmur heard. Pulmonary:     Effort: Pulmonary effort is normal.     Breath sounds: Normal breath sounds.  Abdominal:     General: There is no distension.     Palpations: Abdomen is soft.     Tenderness: There is abdominal tenderness (right mid). There is no guarding.  Musculoskeletal:     Cervical back: Normal range of motion and neck supple. No rigidity.  Skin:    General: Skin is warm.     Capillary Refill: Capillary refill takes less than 2 seconds.     Findings: No rash.  Neurological:     General: No focal deficit present.     Mental Status: She is alert.     Cranial Nerves: No cranial nerve deficit.  Psychiatric:        Mood and Affect: Mood normal.     ED Results / Procedures / Treatments   Labs (all labs ordered are listed, but only abnormal results are displayed) Labs Reviewed  BASIC METABOLIC PANEL - Abnormal; Notable for the following components:      Result Value   Glucose, Bld 116 (*)    All other components within normal limits  CBC - Abnormal; Notable for the following components:   WBC 11.8 (*)    All other components within normal limits  TROPONIN I (HIGH SENSITIVITY)    EKG EKG Interpretation Date/Time:  Wednesday February 07 2023 13:21:38 EDT Ventricular Rate:  68 PR Interval:  154 QRS Duration:  72 QT Interval:  396 QTC Calculation: 421 R Axis:   44  Text Interpretation: Normal sinus rhythm Low voltage QRS Abnormal ECG When compared with ECG of 14-Jan-2021 21:51, PREVIOUS ECG IS PRESENT Confirmed by Blane Ohara [id number]) on 02/07/2023 2:30:45 PM  Radiology No results found.  Procedures Procedures    Medications Ordered in ED Medications  fentaNYL (SUBLIMAZE) injection 40 mcg (has no administration in time range)  aspirin chewable tablet 324 mg (has no administration in time range)    ED Course/ Medical Decision Making/ A&P                                 Medical  Decision Making Amount and/or Complexity of Data Reviewed Labs: ordered. Radiology: ordered.  Risk OTC drugs. Prescription drug management.   Patient with history of known clotting disorder compliant with anticoagulant presents with anterior chest tightness and right sided pain.  Differential includes musculoskeletal, liver related, gastric related, dissection, other.  Plan for CT angiogram for further delineation of right side pain, troponins, blood work, pain meds and reassessment.  Patient care be signed  out to follow-up CT scan results and reassess for final disposition.  Patient has seen cardiology in the past and if her signs resolve and workup unremarkable she would like to follow-up locally with the cardiology group.  EKG reviewed independently sinus rhythm no acute ST elevation.        Final Clinical Impression(s) / ED Diagnoses Final diagnoses:  Acute chest pain  Right lateral abdominal pain    Rx / DC Orders ED Discharge Orders          Ordered    Ambulatory referral to Cardiology       Comments: If you have not heard from the Cardiology office within the next 72 hours please call [phone].   02/07/23 1427              Blane Ohara, MD 02/07/23 1432

## 2023-02-07 NOTE — ED Provider Notes (Signed)
Patient signed out to me at 1500 by Dr. Conception Chancy pending CT scan and reassessment.  In short this is a 77 year old female with a past medical history of VTE on Eliquis, TIA, asthma and celiac presenting to the emergency department with chest pain and abdominal pain.  Patient was initially hemodynamically stable on arrival.  She had labs, EKG and chest x-ray performed.  EKG showed normal sinus rhythm without acute ischemic changes, initial troponin is negative.  She has a mild leukocytosis and labs are otherwise normal.  She is pending repeat troponin and CT dissection study at this time.  My evaluation, the patient is mildly hypertensive and otherwise hemodynamically stable, mostly complaining of pain in her right shoulder and right trapezius, she does have tenderness on palpation.  Reports that the fentanyl did not help with the pain and made her nauseous.  She will be given Toradol and Zofran.  Patient will be closely reassessed.  Clinical Course as of 02/07/23 1724  Wed Feb 07, 2023  1722 Patient's repeat troponin remained negative.  CT dissection study showed no acute abnormalities.  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  Patient reports that her pain has significantly improved after Toradol.  Patient is stable for discharge home with outpatient follow-up.  She states that her primary doctor will refer her to her cardiologist.  She is given strict return precautions. [VK]    Clinical Course User Index [VK] Rexford Maus, DO      Rexford Maus, [place] 02/07/23 1724

## 2023-02-07 NOTE — ED Notes (Addendum)
[id number]

## 2023-02-28 DIAGNOSIS — H2513 Age-related nuclear cataract, bilateral: Secondary | ICD-10-CM | POA: Diagnosis not present

## 2023-03-05 ENCOUNTER — Ambulatory Visit (HOSPITAL_COMMUNITY)
Admission: RE | Disposition: A | Discharge status: Home / Self Care | Payer: Self-pay | Source: Home / Self Care | Attending: Vascular Surgery

## 2023-03-05 ENCOUNTER — Other Ambulatory Visit: Payer: Self-pay

## 2023-03-05 ENCOUNTER — Other Ambulatory Visit (HOSPITAL_COMMUNITY): Payer: Self-pay

## 2023-03-05 ENCOUNTER — Ambulatory Visit (HOSPITAL_COMMUNITY)
Admission: RE | Admit: 2023-03-05 | Discharge: 2023-03-05 | Disposition: A | Discharge status: Home / Self Care | Payer: PPO | Attending: Vascular Surgery | Admitting: Vascular Surgery

## 2023-03-05 ENCOUNTER — Telehealth: Payer: Self-pay | Admitting: Vascular Surgery

## 2023-03-05 ENCOUNTER — Encounter (HOSPITAL_COMMUNITY): Payer: Self-pay | Admitting: Vascular Surgery

## 2023-03-05 DIAGNOSIS — Z01818 Encounter for other preprocedural examination: Secondary | ICD-10-CM | POA: Diagnosis not present

## 2023-03-05 DIAGNOSIS — Z96651 Presence of right artificial knee joint: Secondary | ICD-10-CM | POA: Insufficient documentation

## 2023-03-05 DIAGNOSIS — Z86718 Personal history of other venous thrombosis and embolism: Secondary | ICD-10-CM | POA: Diagnosis not present

## 2023-03-05 DIAGNOSIS — Z7901 Long term (current) use of anticoagulants: Secondary | ICD-10-CM | POA: Diagnosis not present

## 2023-03-05 DIAGNOSIS — D6861 Antiphospholipid syndrome: Secondary | ICD-10-CM | POA: Diagnosis not present

## 2023-03-05 DIAGNOSIS — Z408 Encounter for other prophylactic surgery: Secondary | ICD-10-CM | POA: Insufficient documentation

## 2023-03-05 HISTORY — PX: IVC FILTER INSERTION: CATH118245

## 2023-03-05 LAB — GLUCOSE, CAPILLARY
Glucose-Capillary: 102 mg/dL — ABNORMAL HIGH (ref 70–99)
Glucose-Capillary: 99 mg/dL (ref 70–99)

## 2023-03-05 SURGERY — IVC FILTER INSERTION
Anesthesia: LOCAL

## 2023-03-05 MED ORDER — SODIUM CHLORIDE 0.9 % IV SOLN: INTRAVENOUS | Status: DC

## 2023-03-05 MED ORDER — MIDAZOLAM HCL 2 MG/2ML IJ SOLN
INTRAMUSCULAR | Status: DC | PRN
  Administered 2023-03-05: 1 mg via INTRAVENOUS

## 2023-03-05 MED ORDER — IODIXANOL 320 MG/ML IV SOLN
INTRAVENOUS | Status: DC | PRN
  Administered 2023-03-05: 10 mL via INTRAVENOUS

## 2023-03-05 MED ORDER — HEPARIN (PORCINE) IN NACL 1000-0.9 UT/500ML-% IV SOLN
INTRAVENOUS | Status: DC | PRN
  Administered 2023-03-05: 500 mL via INTRAVENOUS

## 2023-03-05 MED ORDER — FENTANYL CITRATE (PF) 100 MCG/2ML IJ SOLN
INTRAMUSCULAR | Status: AC
  Filled 2023-03-05: qty 2

## 2023-03-05 MED ORDER — TETRACAINE HCL 1 % IJ SOLN
INTRAMUSCULAR | Status: DC | PRN
  Administered 2023-03-05: 2 mg

## 2023-03-05 MED ORDER — MIDAZOLAM HCL 2 MG/2ML IJ SOLN
INTRAMUSCULAR | Status: AC
Start: 2023-03-05 — End: ?
  Filled 2023-03-05: qty 2

## 2023-03-05 MED ORDER — FENTANYL CITRATE (PF) 100 MCG/2ML IJ SOLN
INTRAMUSCULAR | Status: DC | PRN
  Administered 2023-03-05: 50 ug via INTRAVENOUS

## 2023-03-05 MED ORDER — LIDOCAINE HCL (PF) 1 % IJ SOLN
INTRAMUSCULAR | Status: AC
  Filled 2023-03-05: qty 30

## 2023-03-05 SURGICAL SUPPLY — 8 items
COVER DOME SNAP 22 D (MISCELLANEOUS) IMPLANT
FILTER VC CELECT-FEMORAL (Filter) IMPLANT
KIT MICROPUNCTURE NIT STIFF (SHEATH) IMPLANT
KIT PV (KITS) ×1 IMPLANT
SHEATH PROBE COVER 6X72 (BAG) IMPLANT
TRANSDUCER W/STOPCOCK (MISCELLANEOUS) ×1 IMPLANT
TRAY PV CATH (CUSTOM PROCEDURE TRAY) ×1 IMPLANT
WIRE STARTER BENTSON 035X150 (WIRE) IMPLANT

## 2023-03-05 NOTE — H&P (Signed)
HPI:   Laurie Green is a 77 y.o. female with history significant for multiple DVTs many years ago.  She did have a filter placed at [facility] over 20 years ago and it was removed.  She recently underwent filter placement here in preparation for total knee arthroplasty filter was subsequently removed.  She now requires a revision of her total knee arthroplasty.  She continues to take Eliquis but was held for this procedure.       Past Medical History:  Diagnosis Date   Anginal pain (HCC)     Anti-cardiolipin antibody syndrome (HCC)      antiphospholipid antibody syndrome   Antiphospholipid antibody with hypercoagulable state (HCC) 11/07/2012   Arthritis     Asthma      well controlled, no rescue inhaler used   Blood dyscrasia      anticardiolipid syndrome   Blood transfusion      age 71 yr old-none since   Celiac disease     Diabetes mellitus      Type II   Diverticulitis     DVT (deep venous thrombosis) (HCC) 05/01/2011    "to liver"   Family history of adverse reaction to anesthesia      N&V   Fibromyalgia     GERD (gastroesophageal reflux disease)      11/01/16- not problem   H/O seasonal allergies     Heart murmur      "not a problem"   Hypertension     NASH (nonalcoholic steatohepatitis) 05/01/2011   Nausea & vomiting      11-17-14 "nausea and vomiting after meals" at present discussed with Dr. Randa Evens office per pt.   Pneumonia 2021   PONV (postoperative nausea and vomiting)      'I dont have it if they give me something in the IV."   Portal hypertension (HCC)     Stroke (HCC)      tia- eye,   TIA (transient ischemic attack)     Varices 05/01/2011   Varices, esophageal (HCC)     Varices, gastric               Family History  Problem Relation Age of Onset   Cancer Mother     Alzheimer's disease Mother     Cancer Father     Heart defect Father               Past Surgical History:  Procedure Laterality Date   ABDOMINAL HYSTERECTOMY       ANKLE FRACTURE  SURGERY Left      retained hardware   CHOLECYSTECTOMY       COLON SURGERY        hx. diverticulitis   COLONOSCOPY N/A 11/23/2014    Procedure: COLONOSCOPY;  Surgeon: Carman Ching, MD;  Location: [facility] ENDOSCOPY;  Service: Endoscopy;  Laterality: N/A;   ESOPHAGOGASTRODUODENOSCOPY   04/28/2011    Procedure: ESOPHAGOGASTRODUODENOSCOPY (EGD);  Surgeon: Vertell Novak., MD;  Location: [facility] ENDOSCOPY;  Service: Endoscopy;  Laterality: N/A;   ESOPHAGOGASTRODUODENOSCOPY N/A 11/23/2014    Procedure: ESOPHAGOGASTRODUODENOSCOPY (EGD);  Surgeon: Carman Ching, MD;  Location: [facility] ENDOSCOPY;  Service: Endoscopy;  Laterality: N/A;   ESOPHAGOGASTRODUODENOSCOPY (EGD) WITH PROPOFOL N/A 11/06/2016    Procedure: ESOPHAGOGASTRODUODENOSCOPY (EGD) WITH PROPOFOL;  Surgeon: Carman Ching, MD;  Location: [facility] ENDOSCOPY;  Service: Endoscopy;  Laterality: N/A;   EUS N/A 09/25/2012    Procedure: ESOPHAGEAL ENDOSCOPIC ULTRASOUND (EUS) RADIAL;  Surgeon: Willis Modena, MD;  Location: [facility]  ENDOSCOPY;  Service: Endoscopy;  Laterality: N/A;   HERNIA REPAIR        luq incisional   IVC FILTER INSERTION N/A 07/19/2020    Procedure: IVC FILTER INSERTION;  Surgeon: Maeola Harman, MD;  Location: [facility] INVASIVE CV LAB;  Service: Cardiovascular;  Laterality: N/A;   SPLENECTOMY, TOTAL       TONSILLECTOMY       TOTAL KNEE ARTHROPLASTY Right 07/21/2020    Procedure: TOTAL KNEE ARTHROPLASTY;  Surgeon: Jene Every, MD;  Location: [facility] ORS;  Service: Orthopedics;  Laterality: Right;  2.5 hrs          Short Social History:  Social History         Tobacco Use   Smoking status: Never   Smokeless tobacco: Never   Tobacco comments:      never used tobacco  Substance Use Topics   Alcohol use: No      Alcohol/week: 0.0 standard drinks      Allergies       Allergies  Allergen Reactions   Hydrocodone Anaphylaxis and Nausea And Vomiting   Lidocaine Hcl Anaphylaxis   Piperacillin Sod-Tazobactam So Itching      "scalp itch"-was  given Benadryl to counteract. "scalp itch"-was given Benadryl to counteract. "scalp itch"-was given Benadryl to counteract.   Cyclosporine Other (See Comments)      burns burns burns   Hydromorphone Hcl Nausea And Vomiting      Can take with zofran   Morphine And Related Nausea And Vomiting   Percocet [Oxycodone-Acetaminophen] Nausea And Vomiting              Current Outpatient Medications  Medication Sig Dispense Refill   benazepril (LOTENSIN) 20 MG tablet Take 20 mg by mouth every morning.       ELIQUIS 5 MG TABS tablet TAKE 1 TABLET BY MOUTH TWICE A DAY 60 tablet 6   famotidine (PEPCID) 20 MG tablet Take 1 tablet (20 mg total) by mouth 2 (two) times daily. 30 tablet 0   LANTUS SOLOSTAR 100 UNIT/ML Solostar Pen Inject 24 Units into the skin at bedtime.       Magnesium 250 MG TABS Take 250 mg by mouth daily with lunch.       Misc Natural Products (BLOOD SUGAR BALANCE) TABS Take 1 tablet by mouth in the morning. Glucofort (blood sugar support formula )       NON FORMULARY Take 80,000 Units by mouth daily with lunch. Serrapeptase       ondansetron (ZOFRAN ODT) 8 MG disintegrating tablet Take 1 tablet (8 mg total) by mouth every 8 (eight) hours as needed for nausea or vomiting. 20 tablet 0   PREMARIN vaginal cream PLACE 1 APPLICATORFUL VAGINALLY 2 TIMES A WEEK. MONDAY AND WEDNESDAY OR THURSDAY. (Patient taking differently: Place 1 Applicatorful vaginally 2 (two) times a week. Mondays & Thursdays) 30 g 3   propranolol ER (INDERAL LA) 80 MG 24 hr capsule Take 80 mg by mouth in the morning.       thiamine (VITAMIN B-1) 100 MG tablet Take 200 mg by mouth daily with lunch.       traMADol (ULTRAM) 50 MG tablet Take 1-2 tablets (50-100 mg total) by mouth every 6 (six) hours as needed (mild to moderate pain). 40 tablet 0   vitamin B-12 (CYANOCOBALAMIN) 1000 MCG tablet Take 1,000 mcg by mouth 2 (two) times a week.       vitamin C (ASCORBIC ACID) 500 MG tablet Take 500 mg  by mouth 2 (two) times a  week.       CALCIUM PO Take 1 tablet by mouth 2 (two) times a week. (Patient not taking: No sig reported)       cetirizine (ZYRTEC ALLERGY) 10 MG tablet Take 1 tablet (10 mg total) by mouth daily. (Patient not taking: Reported on 07/06/2020) 30 tablet 0   Cholecalciferol (VITAMIN D3) 2000 UNITS capsule Take 4,000 Units by mouth daily with lunch. (Patient not taking: Reported on 09/06/2020)       docusate sodium (COLACE) 100 MG capsule Take 1 capsule (100 mg total) by mouth 2 (two) times daily as needed for mild constipation. 30 capsule 1   glucose 4 GM chewable tablet Chew 16 g by mouth as needed for low blood sugar. (Patient not taking: No sig reported)       hydrOXYzine (ATARAX/VISTARIL) 25 MG tablet Take 1 tablet (25 mg total) by mouth every 6 (six) hours as needed for itching. (Patient not taking: No sig reported) 12 tablet 0   meclizine (ANTIVERT) 12.5 MG tablet Take 1 tablet (12.5 mg total) by mouth 3 (three) times daily as needed for dizziness. 30 tablet 0   NEOMYCIN-POLYMYXIN-HYDROCORTISONE (CORTISPORIN) 1 % SOLN OTIC solution Place 3 drops into the left ear 4 (four) times daily. (Patient not taking: No sig reported) 10 mL 2   niacin 500 MG tablet Take 500 mg by mouth daily with lunch. (Patient not taking: Reported on 09/06/2020)       ondansetron (ZOFRAN) 4 MG tablet Take 1 tablet (4 mg total) by mouth every 6 (six) hours as needed for nausea. (Patient not taking: Reported on 11/12/2020) 40 tablet 0   OVER THE COUNTER MEDICATION Take 28.3 g by mouth 2 (two) times a week. Carbon-60 (2 tablespoons twice weekly) (Patient not taking: No sig reported)       oxyCODONE (OXY IR/ROXICODONE) 5 MG immediate release tablet Take 1-2 tablets (5-10 mg total) by mouth every 4 (four) hours as needed for moderate pain or severe pain. 30 tablet 0   polyethylene glycol (MIRALAX / GLYCOLAX) 17 g packet Take 17 g by mouth daily. 14 each 0      No current facility-administered medications for this visit.         Review of Systems  Constitutional: Positive for fatigue.  HENT: HENT negative.  Eyes: Eyes negative.  Cardiovascular: Positive for irregular heartbeat.  GI: Gastrointestinal negative.  Musculoskeletal: Musculoskeletal negative.  Skin: Skin negative.  Neurological: Neurological negative. Hematologic: Hematologic/lymphatic negative.  Psychiatric: Positive for sleep disturbance.       Anxiety        Objective:   There were no vitals filed for this visit.    Physical Exam HENT:     Head: Normocephalic.  Eyes:     Pupils: Pupils are equal, round, and reactive to light.  Cardiovascular:     Rate and Rhythm: Normal rate.     Pulses: Normal pulses.  Pulmonary:     Effort: Pulmonary effort is normal.  Abdominal:     General: Abdomen is flat.     Palpations: Abdomen is soft.  Musculoskeletal:        General: Normal range of motion.     Comments: Well-healed right knee incision  Skin:    General: Skin is warm.  Neurological:     General: No focal deficit present.     Mental Status: She is alert. Mental status is at baseline.  Psychiatric:  Mood and Affect: Mood normal.        Thought Content: Thought content normal.        Judgment: Judgment normal.           Assessment/Plan 77 year old female with history of multiple DVTs recently had an IVC filter placed for total knee arthroplasty.  She is having filter removed but now is requiring total knee arthroplasty revision and is holding Eliquis in preparation for replacement of IVC filter.  We again discussed the need to be fully overall orthopedic procedures prior to removing this filter and she demonstrates good understanding.  We also discussed the risk and benefits particularly the risk of scar tissue in the IVC and difficulty removing particularly the second 1 was difficult to remove if she had a previous filter from [facility] many years ago.  She also demonstrates good understanding of complicated nature of having multiple  filters.         Maeola Harman MD [facility]

## 2023-03-05 NOTE — Op Note (Signed)
    Patient name: Laurie Green MRN: [id number] DOB: 11/01/1945 Sex: female  03/05/2023 Pre-operative Diagnosis: History of antiphospholipid antibody syndrome, need for right total knee arthroplasty revision Post-operative diagnosis:  Same Surgeon:  Luanna Salk. Randie Heinz, MD Procedure Performed: 1.  Ultrasound-guided cannulation right common femoral vein 2.  IVC venogram 3.  Placement of infrarenal Cook Celect IVC filter 4.  Moderate sedation with fentanyl and Versed for 6 minutes  Indications: 77 year old female with a history of antiphospholipid antibody syndrome has had 2 previous filters in the past 1 many years ago at [facility] that was removed and another here that I removed last year after she recovered from total knee arthroplasty.  She is now indicated for total knee arthroplasty revision and she is indicated for filter replacement to prevent thrombotic issues.  Findings: The IVC measured less than 40 mm but was very short in the infrarenal position.  A filter was placed but is tilted towards the left renal vein similar to her previous filter.  Plan will be to follow-up with me in 3 to 4 months to discuss timing of filter removal.   Procedure:  The patient was identified in the holding area and taken to room 8.  The patient was then placed supine on the table and prepped and draped in the usual sterile fashion.  A time out was called.  Ultrasound was used to evaluate the right common femoral vein which was noted to be patent and compressible.  Moderate sedation with fentanyl and Versed was administered and her vital signs were monitored throughout the case.  Her right groin was anesthetized with tetracaine.  The common femoral vein was cannulated with a micropuncture needle followed by wire and sheath and a Bentson wire was placed and this was dilated and the filter introducer sheath was placed in the IVC venogram performed.  Filter was then placed in the infrarenal position.  Fortunately this did  have a tilt towards the left renal vein but I think this is secondary to short infrarenal IVC.  Filter was left in place we will discuss removal in 3 to 4 months pending recovery from total knee arthroplasty.  She tolerated the procedure without any complication and after the sheath was pulled pressure was held until hemostasis was obtained.  Contrast: 10 cc   Maureen Delatte C. Randie Heinz, MD [facility] [place] Office: [phone] Pager: [phone]

## 2023-03-05 NOTE — Telephone Encounter (Signed)
-----   Message from Lemar Livings sent at 03/05/2023  9:47 AM EST ----- Laurie Green [id number] Sep 11, 1945   Pre-operative Diagnosis: History of antiphospholipid antibody syndrome, need for right total knee arthroplasty revision  Surgeon:  Luanna Salk. Randie Heinz, MD  Procedure Performed: 1.  Ultrasound-guided cannulation right common femoral vein 2.  IVC venogram 3.  Placement of infrarenal Cook Celect IVC filter 4.  Moderate sedation with fentanyl and Versed for 6 minutes  Please have patient follow-up with me in 3 to 4 months to discuss IVC filter removal.  Apolinar Junes

## 2023-03-06 MED FILL — Lidocaine HCl Local Preservative Free (PF) Inj 1%: INTRAMUSCULAR | Qty: 30 | Status: AC

## 2023-03-07 NOTE — Telephone Encounter (Signed)
Appt has been scheduled for 02/19

## 2023-03-08 ENCOUNTER — Other Ambulatory Visit (HOSPITAL_COMMUNITY): Payer: Self-pay

## 2023-03-08 NOTE — Progress Notes (Signed)
Second request for pre op orders sent message via [facility] to [facility] and Dr Linna Caprice.

## 2023-03-09 ENCOUNTER — Other Ambulatory Visit: Payer: Self-pay | Admitting: Hematology & Oncology

## 2023-03-09 ENCOUNTER — Ambulatory Visit: Payer: Self-pay | Admitting: Student

## 2023-03-09 DIAGNOSIS — Z794 Long term (current) use of insulin: Secondary | ICD-10-CM

## 2023-03-12 ENCOUNTER — Encounter (HOSPITAL_COMMUNITY): Payer: Self-pay

## 2023-03-12 ENCOUNTER — Encounter (HOSPITAL_COMMUNITY)
Admission: RE | Admit: 2023-03-12 | Discharge: 2023-03-12 | Disposition: A | Discharge status: Home / Self Care | Payer: PPO | Source: Ambulatory Visit | Attending: Orthopedic Surgery | Admitting: Orthopedic Surgery

## 2023-03-12 ENCOUNTER — Other Ambulatory Visit: Payer: Self-pay

## 2023-03-12 VITALS — BP 151/79 | HR 64 | Temp 98.0°F | Resp 16 | Ht 63.5 in | Wt 135.0 lb

## 2023-03-12 DIAGNOSIS — E0800 Diabetes mellitus due to underlying condition with hyperosmolarity without nonketotic hyperglycemic-hyperosmolar coma (NKHHC): Secondary | ICD-10-CM | POA: Insufficient documentation

## 2023-03-12 DIAGNOSIS — Z01812 Encounter for preprocedural laboratory examination: Secondary | ICD-10-CM | POA: Diagnosis not present

## 2023-03-12 DIAGNOSIS — D6861 Antiphospholipid syndrome: Secondary | ICD-10-CM | POA: Insufficient documentation

## 2023-03-12 DIAGNOSIS — Z794 Long term (current) use of insulin: Secondary | ICD-10-CM | POA: Insufficient documentation

## 2023-03-12 DIAGNOSIS — Z01818 Encounter for other preprocedural examination: Secondary | ICD-10-CM

## 2023-03-12 HISTORY — DX: Anxiety disorder, unspecified: F41.9

## 2023-03-12 LAB — BASIC METABOLIC PANEL
Anion gap: 8 (ref 5–15)
BUN: 13 mg/dL (ref 8–23)
CO2: 27 mmol/L (ref 22–32)
Calcium: 9.4 mg/dL (ref 8.9–10.3)
Chloride: 104 mmol/L (ref 98–111)
Creatinine, Ser: 0.75 mg/dL (ref 0.44–1.00)
GFR, Estimated: >60 mL/min (ref >60)
Glucose, Bld: 139 mg/dL — ABNORMAL HIGH (ref 70–99)
Potassium: 3.8 mmol/L (ref 3.5–5.1)
Sodium: 139 mmol/L (ref 135–145)

## 2023-03-12 LAB — CBC
HCT: 40.7 % (ref 36.0–46.0)
Hemoglobin: 13.9 g/dL (ref 12.0–15.0)
MCH: 33.8 pg (ref 26.0–34.0)
MCHC: 34.2 g/dL (ref 30.0–36.0)
MCV: 99 fL (ref 80.0–100.0)
Platelets: 295 10*3/uL (ref 150–400)
RBC: 4.11 MIL/uL (ref 3.87–5.11)
RDW: 13 % (ref 11.5–15.5)
WBC: 6.4 10*3/uL (ref 4.0–10.5)
nRBC: 0 % (ref 0.0–0.2)

## 2023-03-12 LAB — HEMOGLOBIN A1C
Hgb A1c MFr Bld: 7.6 % — ABNORMAL HIGH (ref 4.8–5.6)
Mean Plasma Glucose: 171.42 mg/dL

## 2023-03-12 LAB — SURGICAL PCR SCREEN
MRSA, PCR: NEGATIVE
Staphylococcus aureus: POSITIVE — AB

## 2023-03-12 LAB — GLUCOSE, CAPILLARY: Glucose-Capillary: 116 mg/dL — ABNORMAL HIGH (ref 70–99)

## 2023-03-12 NOTE — Progress Notes (Addendum)
Anesthesia Review:  PCP: Chari Manning, NP  Cardiologist : Nahser  Preop p telephone visit on 02/02/23 - Tessa Conte,PAC  Chest x-ray  02/07/23- 2 view  EKG : 02/12/23  Echo : 2021  Stress test: Cardiac Cath :  Activity level: cannot do a flight of stairs without difficutly  Sleep Study/ CPAP : none  Fasting Blood Sugar :      / Checks Blood Sugar -- times a day:   Blood Thinner/ Instructions /Last Dose: ASA / Instructions/ Last Dose :    Eliquis- stop 3 days prior per pt    DM- type 2 checks glucose 3-4x daily  Hgba1c-03/12/23-7.6-rotued to DR Swinteck on 03/12/23.   Lantus- 1/2 dose nite before and 1/2 dose am of surgery    NO VACCINES PER PT    IN ED with acute chest pain  02/07/23  PT with hx of DVT  IVC filter placed 03/05/23.  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

## 2023-03-12 NOTE — Progress Notes (Addendum)
SURGICAL WAITING ROOM VISITATION  Patients having surgery or a procedure may have no more than 2 support people in the waiting area - these visitors may rotate.    Children under the age of 21 must have an adult with them who is not the patient.  Due to an increase in RSV and influenza rates and associated hospitalizations, children ages 3 and under may not visit patients in [facility] hospitals.  If the patient needs to stay at the hospital during part of their recovery, the visitor guidelines for inpatient rooms apply. Pre-op nurse will coordinate an appropriate time for 1 support person to accompany patient in pre-op.  This support person may not rotate.    Please refer to the [facility] website for the visitor guidelines for Inpatients (after your surgery is over and you are in a regular room).       Your procedure is scheduled on:  03/22/2023    Report to [facility] Main Entrance    Report to admitting at  0800 AM   Call this number if you have problems the morning of surgery [phone]   Do not eat food :After Midnight.   After Midnight you may have the following liquids until _ 0730_____ AM/  DAY OF SURGERY  Water Non-Citrus Juices (without pulp, NO RED-Apple, White grape, White cranberry) Black Coffee (NO MILK/CREAM OR CREAMERS, sugar ok)  Clear Tea (NO MILK/CREAM OR CREAMERS, sugar ok) regular and decaf                             Plain Jell-O (NO RED)                                           Fruit ices (not with fruit pulp, NO RED)                                     Popsicles (NO RED)                                                               Sports drinks like Gatorade (NO RED)                     The day of surgery:  Drink ONE (1) Pre-Surgery Clear Ensure or G2 at   0730AM the morning of surgery. Drink in one sitting. Do not sip.  This drink was given to you during your hospital  pre-op appointment visit. Nothing else to drink after completing  the  Pre-Surgery Clear Ensure or G2.          If you have questions, please contact your surgeon's office.        Oral Hygiene is also important to reduce your risk of infection.                                    Remember - BRUSH YOUR TEETH THE MORNING OF SURGERY WITH YOUR REGULAR TOOTHPASTE  DENTURES WILL BE REMOVED PRIOR TO SURGERY PLEASE DO NOT APPLY "Poly grip" OR ADHESIVES!!!   Do NOT smoke after Midnight   Stop all vitamins and herbal supplements 7 days before surgery.   Take these medicines the morning of surgery with A SIP OF WATER:   Propanolol Take 1/2 dose of Lantus nite before surgery and 1/2 dose am of surgery  DO NOT TAKE ANY ORAL DIABETIC MEDICATIONS DAY OF YOUR SURGERY  Bring CPAP mask and tubing day of surgery.                              You may not have any metal on your body including hair pins, jewelry, and body piercing             Do not wear make-up, lotions, powders, perfumes/cologne, or deodorant  Do not wear nail polish including gel and S&S, artificial/acrylic nails, or any other type of covering on natural nails including finger and toenails. If you have artificial nails, gel coating, etc. that needs to be removed by a nail salon please have this removed prior to surgery or surgery may need to be canceled/ delayed if the surgeon/ anesthesia feels like they are unable to be safely monitored.   Do not shave  48 hours prior to surgery.               Men may shave face and neck.   Do not bring valuables to the hospital. [facility] IS NOT             RESPONSIBLE   FOR VALUABLES.   Contacts, glasses, dentures or bridgework may not be worn into surgery.   Bring small overnight bag day of surgery.   DO NOT BRING YOUR HOME MEDICATIONS TO THE HOSPITAL. PHARMACY WILL DISPENSE MEDICATIONS LISTED ON YOUR MEDICATION LIST TO YOU DURING YOUR ADMISSION IN THE HOSPITAL!    Patients discharged on the day of surgery will not be allowed to drive home.  Someone  NEEDS to stay with you for the first 24 hours after anesthesia.   Special Instructions: Bring a copy of your healthcare power of attorney and living will documents the day of surgery if you haven't scanned them before.              Please read over the following fact sheets you were given: IF YOU HAVE QUESTIONS ABOUT YOUR PRE-OP INSTRUCTIONS PLEASE CALL [phone]   If you received a COVID test during your pre-op visit  it is requested that you wear a mask when out in public, stay away from anyone that may not be feeling well and notify your surgeon if you develop symptoms. If you test positive for Covid or have been in contact with anyone that has tested positive in the last 10 days please notify you surgeon.      Pre-operative 5 CHG Bath Instructions   You can play a key role in reducing the risk of infection after surgery. Your skin needs to be as free of germs as possible. You can reduce the number of germs on your skin by washing with CHG (chlorhexidine gluconate) soap before surgery. CHG is an antiseptic soap that kills germs and continues to kill germs even after washing.   DO NOT use if you have an allergy to chlorhexidine/CHG or antibacterial soaps. If your skin becomes reddened or irritated, stop using the CHG and notify one of our RNs at  [phone].   Please shower with the CHG soap starting 4 days before surgery using the following schedule:     Please keep in mind the following:  DO NOT shave, including legs and underarms, starting the day of your first shower.   You may shave your face at any point before/day of surgery.  Place clean sheets on your bed the day you start using CHG soap. Use a clean washcloth (not used since being washed) for each shower. DO NOT sleep with pets once you start using the CHG.   CHG Shower Instructions:  If you choose to wash your hair and private area, wash first with your normal shampoo/soap.  After you use shampoo/soap, rinse your hair  and body thoroughly to remove shampoo/soap residue.  Turn the water OFF and apply about 3 tablespoons (45 ml) of CHG soap to a CLEAN washcloth.  Apply CHG soap ONLY FROM YOUR NECK DOWN TO YOUR TOES (washing for 3-5 minutes)  DO NOT use CHG soap on face, private areas, open wounds, or sores.  Pay special attention to the area where your surgery is being performed.  If you are having back surgery, having someone wash your back for you may be helpful. Wait 2 minutes after CHG soap is applied, then you may rinse off the CHG soap.  Pat dry with a clean towel  Put on clean clothes/pajamas   If you choose to wear lotion, please use ONLY the CHG-compatible lotions on the back of this paper.     Additional instructions for the day of surgery: DO NOT APPLY any lotions, deodorants, cologne, or perfumes.   Put on clean/comfortable clothes.  Brush your teeth.  Ask your nurse before applying any prescription medications to the skin.      CHG Compatible Lotions   Aveeno Moisturizing lotion  Cetaphil Moisturizing Cream  Cetaphil Moisturizing Lotion  Clairol Herbal Essence Moisturizing Lotion, Dry Skin  Clairol Herbal Essence Moisturizing Lotion, Extra Dry Skin  Clairol Herbal Essence Moisturizing Lotion, Normal Skin  Curel Age Defying Therapeutic Moisturizing Lotion with Alpha Hydroxy  Curel Extreme Care Body Lotion  Curel Soothing Hands Moisturizing Hand Lotion  Curel Therapeutic Moisturizing Cream, Fragrance-Free  Curel Therapeutic Moisturizing Lotion, Fragrance-Free  Curel Therapeutic Moisturizing Lotion, Original Formula  Eucerin Daily Replenishing Lotion  Eucerin Dry Skin Therapy Plus Alpha Hydroxy Crme  Eucerin Dry Skin Therapy Plus Alpha Hydroxy Lotion  Eucerin Original Crme  Eucerin Original Lotion  Eucerin Plus Crme Eucerin Plus Lotion  Eucerin TriLipid Replenishing Lotion  Keri Anti-Bacterial Hand Lotion  Keri Deep Conditioning Original Lotion Dry Skin Formula Softly Scented   Keri Deep Conditioning Original Lotion, Fragrance Free Sensitive Skin Formula  Keri Lotion Fast Absorbing Fragrance Free Sensitive Skin Formula  Keri Lotion Fast Absorbing Softly Scented Dry Skin Formula  Keri Original Lotion  Keri Skin Renewal Lotion Keri Silky Smooth Lotion  Keri Silky Smooth Sensitive Skin Lotion  Nivea Body Creamy Conditioning Oil  Nivea Body Extra Enriched Teacher, adult education Moisturizing Lotion Nivea Crme  Nivea Skin Firming Lotion  NutraDerm 30 Skin Lotion  NutraDerm Skin Lotion  NutraDerm Therapeutic Skin Cream  NutraDerm Therapeutic Skin Lotion  ProShield Protective Hand Cream  Provon moisturizing lotion

## 2023-03-14 NOTE — Progress Notes (Signed)
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 never smoker with h/o PONV, HTN, h/o anticardiolipin antibody / hypercoagulable syndrome (sees Dr. Myna Hidalgo), DM II, TIA, failed right total knee arthroplasty scheduled for above procedure 03/22/2023 with Dr. Samson Frederic.   Per cardiology preoperative evaluation 02/02/2023, "Laurie Green's perioperative risk of a major cardiac event is 0.9% according to the Revised Cardiac Risk Index (RCRI).  Therefore, she is at low risk for perioperative complications.   Her functional capacity is good at 5.62 METs according to the Duke Activity Status Index (DASI). Recommendations: According to ACC/AHA guidelines, no further cardiovascular testing needed.  The patient may proceed to surgery at acceptable risk.   Antiplatelet and/or Anticoagulation Recommendations:   Eliquis holding parameters should come from prescribing physician"  Pt advised to hold Eliquis 3 days prior to procedure.   IV filter placed by Dr. Randie Heinz 03/05/2023.   A1C 7.6, forwarded to Dr. Linna Caprice.  VS: BP (!) 151/79   Pulse 64   Temp 36.7 C (Oral)   Resp 16   Ht 5' 3.5" (1.613 m)   Wt 61.2 kg   SpO2 97%   BMI 23.54 kg/m   PROVIDERS: Camie Patience, FNP is PCP   Primary Cardiologist:  Kristeen Miss, MD  LABS:  labs forwarded to surgeon (all labs ordered are listed, but only abnormal results are displayed)  Labs Reviewed  SURGICAL PCR SCREEN - Abnormal; Notable for the following components:      Result Value   Staphylococcus aureus POSITIVE (*)    All other components within normal limits  BASIC METABOLIC PANEL - Abnormal; Notable for the following components:   Glucose, Bld 139 (*)    All other components within normal limits   HEMOGLOBIN A1C - Abnormal; Notable for the following components:   Hgb A1c MFr Bld 7.6 (*)    All other components within normal limits  GLUCOSE, CAPILLARY - Abnormal; Notable for the following components:   Glucose-Capillary 116 (*)    All other components within normal limits  CBC  TYPE AND SCREEN     IMAGES:   EKG:   CV: Echo 08/05/2019 1. Left ventricular ejection fraction, by estimation, is 60 to 65%. The  left ventricle has normal function. The left ventricle has no regional  wall motion abnormalities. There is moderate asymmetric left ventricular  hypertrophy of the basal-septal  segment. Left ventricular diastolic parameters are consistent with Grade I  diastolic dysfunction (impaired relaxation).   2. Right ventricular systolic function is normal. The right ventricular  size is normal.   3. The mitral valve is grossly normal. Trivial mitral valve  regurgitation. No evidence of mitral stenosis.   4. The aortic valve is tricuspid. Aortic valve regurgitation is not  visualized. Mild to moderate aortic valve sclerosis/calcification is  present, without any evidence of aortic stenosis.  Past Medical History:  Diagnosis Date   Anginal pain (HCC)    Anti-cardiolipin antibody syndrome (HCC)    antiphospholipid antibody syndrome   Antiphospholipid antibody with hypercoagulable state (HCC) 11/07/2012   Anxiety    Arthritis    Blood dyscrasia    anticardiolipid syndrome   Blood transfusion    age 6 yr old-none since   Celiac disease  Diabetes mellitus    Type II   Diverticulitis    DVT (deep venous thrombosis) (HCC) 05/01/2011   "to liver"   Family history of adverse reaction to anesthesia    N&V   Fibromyalgia    GERD (gastroesophageal reflux disease)    11/01/16- not problem   H/O seasonal allergies    Heart murmur    "not a problem"   Hypertension    Nausea & vomiting    11-17-14 "nausea and vomiting after meals" at present discussed with Dr. Randa Evens  office per pt.   Pneumonia 2021   PONV (postoperative nausea and vomiting)    'I dont have it if they give me something in the IV."   Portal hypertension (HCC)    Stroke (HCC)    tia- eye,   TIA (transient ischemic attack)    Varices 05/01/2011   Varices, esophageal (HCC)    Varices, gastric     Past Surgical History:  Procedure Laterality Date   ABDOMINAL HYSTERECTOMY     ANKLE FRACTURE SURGERY Left    retained hardware   BIOPSY  05/03/2022   Procedure: BIOPSY;  Surgeon: Willis Modena, MD;  [facility];  Service: Gastroenterology;;   CHOLECYSTECTOMY     COLON SURGERY     hx. diverticulitis   COLONOSCOPY N/A 11/23/2014   Procedure: COLONOSCOPY;  Surgeon: Carman Ching, MD;  [facility];  Service: Endoscopy;  Laterality: N/A;   ESOPHAGOGASTRODUODENOSCOPY  04/28/2011   Procedure: ESOPHAGOGASTRODUODENOSCOPY (EGD);  Surgeon: Vertell Novak., MD;  Location: [facility] ENDOSCOPY;  Service: Endoscopy;  Laterality: N/A;   ESOPHAGOGASTRODUODENOSCOPY N/A 11/23/2014   Procedure: ESOPHAGOGASTRODUODENOSCOPY (EGD);  Surgeon: Carman Ching, MD;  Location: [facility]OSCOPY;  Service: Endoscopy;  Laterality: N/A;   ESOPHAGOGASTRODUODENOSCOPY (EGD) WITH PROPOFOL N/A 11/06/2016   Procedure: ESOPHAGOGASTRODUODENOSCOPY (EGD) WITH PROPOFOL;  Surgeon: Carman Ching, MD;  Location: [facility] ENDOSCOPY;  Service: Endoscopy;  Laterality: N/A;   ESOPHAGOGASTRODUODENOSCOPY (EGD) WITH PROPOFOL Bilateral 05/03/2022   Procedure: ESOPHAGOGASTRODUODENOSCOPY (EGD) WITH PROPOFOL;  Surgeon: Willis Modena, MD;  [facility];  Service: Gastroenterology;  Laterality: Bilateral;   EUS N/A 09/25/2012   Procedure: ESOPHAGEAL ENDOSCOPIC ULTRASOUND (EUS) RADIAL;  Surgeon: Willis Modena, MD;  [facility];  Service: Endoscopy;  Laterality: N/A;   HERNIA REPAIR     luq incisional   IVC FILTER INSERTION N/A 07/19/2020   Procedure: IVC FILTER INSERTION;  Surgeon: Maeola Harman, MD;  Location:  [facility] INVASIVE CV LAB;  Service: Cardiovascular;  Laterality: N/A;   IVC FILTER INSERTION N/A 03/05/2023   Procedure: IVC FILTER INSERTION;  Surgeon: Maeola Harman, MD;  Location: [facility] INVASIVE CV LAB;  Service: Cardiovascular;  Laterality: N/A;   IVC FILTER REMOVAL N/A 12/12/2021   Procedure: IVC FILTER REMOVAL;  Surgeon: Maeola Harman, MD;  Location: [facility] INVASIVE CV LAB;  Service: Cardiovascular;  Laterality: N/A;   IVC VENOGRAPHY N/A 12/12/2021   Procedure: IVC Venography;  Surgeon: Maeola Harman, MD;  Location: [facility] INVASIVE CV LAB;  Service: Cardiovascular;  Laterality: N/A;   SPLENECTOMY, TOTAL     TONSILLECTOMY     TOTAL KNEE ARTHROPLASTY Right 07/21/2020   Procedure: TOTAL KNEE ARTHROPLASTY;  Surgeon: Jene Every, MD;  [facility];  Service: Orthopedics;  Laterality: Right;  2.5 hrs    MEDICATIONS:  acetaminophen (TYLENOL) 500 MG tablet   benazepril (LOTENSIN) 40 MG tablet   ELIQUIS 5 MG TABS tablet   LANTUS SOLOSTAR 100 UNIT/ML Solostar Pen   MAGNESIUM PO   Misc  Natural Products (BLOOD SUGAR BALANCE) TABS   neomycin-bacitracin-polymyxin (NEOSPORIN) OINT   OVER THE COUNTER MEDICATION   OVER THE COUNTER MEDICATION   OVER THE COUNTER MEDICATION   propranolol (INDERAL) 10 MG tablet   propranolol ER (INDERAL LA) 80 MG 24 hr capsule   VITAMIN E PO   No current facility-administered medications for this encounter.    Laurie Cipro Ward, PA-C [facility] [phone]

## 2023-03-14 NOTE — Anesthesia Preprocedure Evaluation (Addendum)
Anesthesia Evaluation  Patient identified by MRN, date of birth, ID band Patient awake    Reviewed: Allergy & Precautions, NPO status , Patient's Chart, lab work & pertinent test results  History of Anesthesia Complications (+) PONV and history of anesthetic complications  Airway Mallampati: II  TM Distance: >3 FB Neck ROM: Full    Dental  (+) Dental Advisory Given   Pulmonary neg pulmonary ROS   Pulmonary exam normal breath sounds clear to auscultation       Cardiovascular hypertension (benazepril, propranolol), Pt. on medications (-) angina + DVT  (-) Past MI, (-) Cardiac Stents and (-) CABG + Valvular Problems/Murmurs  Rhythm:Regular Rate:Normal  TTE 08/05/2019: IMPRESSIONS    1. Left ventricular ejection fraction, by estimation, is 60 to 65%. The  left ventricle has normal function. The left ventricle has no regional  wall motion abnormalities. There is moderate asymmetric left ventricular  hypertrophy of the basal-septal  segment. Left ventricular diastolic parameters are consistent with Grade I  diastolic dysfunction (impaired relaxation).   2. Right ventricular systolic function is normal. The right ventricular  size is normal.   3. The mitral valve is grossly normal. Trivial mitral valve  regurgitation. No evidence of mitral stenosis.   4. The aortic valve is tricuspid. Aortic valve regurgitation is not  visualized. Mild to moderate aortic valve sclerosis/calcification is  present, without any evidence of aortic stenosis.     Neuro/Psych neg Seizures PSYCHIATRIC DISORDERS Anxiety     TIACVA    GI/Hepatic ,GERD  ,,NASH with portal HTN H/o diverticulitis, gastric and esophageal varices, celiac disease   Endo/Other  diabetes, Type 2, Insulin Dependent    Renal/GU negative Renal ROS     Musculoskeletal  (+) Arthritis ,  Fibromyalgia -  Abdominal   Peds  Hematology  (+) Blood dyscrasia (anticardiolipid  syndrome) Lab Results      Component                Value               Date                      WBC                      6.4                 03/12/2023                HGB                      13.9                03/12/2023                HCT                      40.7                03/12/2023                MCV                      99.0                03/12/2023                PLT  295                 03/12/2023              Anesthesia Other Findings 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 Did not receive nerve block with previous surgery. Had GA.  Last Eliquis: 03/18/2023  IVC filter insertion 03/05/2023 in preparation for knee surgery  Morphine and Dilaudid on patient's allergy list, but she states she can take them.  Reproductive/Obstetrics                             Anesthesia Physical Anesthesia Plan  ASA: 3  Anesthesia Plan: General   Post-op Pain Management: Tylenol PO (pre-op)*   Induction: Intravenous  PONV Risk Score and Plan: 4 or greater and Ondansetron, Dexamethasone and Treatment may vary due to age or medical condition  Airway Management Planned: Oral ETT  Additional Equipment:   Intra-op Plan:   Post-operative Plan: Extubation in OR  Informed Consent: I have reviewed the patients History and Physical, chart, labs and discussed the procedure including the risks, benefits and alternatives for the proposed anesthesia with the patient or authorized representative who has indicated his/her understanding and acceptance.     Dental advisory given  Plan Discussed with: CRNA and Anesthesiologist  Anesthesia Plan Comments: (See PAT note 03/12/2023  Risks of general anesthesia discussed including, but not limited to, sore throat, hoarse voice, chipped/damaged teeth, injury to vocal cords, nausea and vomiting, allergic reactions, lung infection, heart attack, stroke, and death. All questions answered. )        Anesthesia Quick Evaluation

## 2023-03-22 ENCOUNTER — Inpatient Hospital Stay (HOSPITAL_COMMUNITY): Payer: PPO

## 2023-03-22 ENCOUNTER — Encounter (HOSPITAL_COMMUNITY): Payer: Self-pay | Admitting: Orthopedic Surgery

## 2023-03-22 ENCOUNTER — Inpatient Hospital Stay (HOSPITAL_COMMUNITY)
Admission: RE | Admit: 2023-03-22 | Discharge: 2023-03-23 | DRG: 467 | Disposition: A | Discharge status: Home / Self Care | Payer: PPO | Attending: Orthopedic Surgery | Admitting: Orthopedic Surgery

## 2023-03-22 ENCOUNTER — Other Ambulatory Visit: Payer: Self-pay

## 2023-03-22 ENCOUNTER — Inpatient Hospital Stay (HOSPITAL_COMMUNITY): Payer: PPO | Admitting: Physician Assistant

## 2023-03-22 ENCOUNTER — Inpatient Hospital Stay (HOSPITAL_COMMUNITY): Payer: Self-pay

## 2023-03-22 ENCOUNTER — Encounter (HOSPITAL_COMMUNITY)
Admission: RE | Disposition: A | Discharge status: Home / Self Care | Payer: Self-pay | Source: Home / Self Care | Attending: Orthopedic Surgery

## 2023-03-22 ENCOUNTER — Ambulatory Visit: Payer: Self-pay | Admitting: Student

## 2023-03-22 DIAGNOSIS — Z01818 Encounter for other preprocedural examination: Secondary | ICD-10-CM

## 2023-03-22 DIAGNOSIS — Z96651 Presence of right artificial knee joint: Secondary | ICD-10-CM | POA: Diagnosis not present

## 2023-03-22 DIAGNOSIS — Y792 Prosthetic and other implants, materials and accessory orthopedic devices associated with adverse incidents: Secondary | ICD-10-CM | POA: Diagnosis present

## 2023-03-22 DIAGNOSIS — D62 Acute posthemorrhagic anemia: Secondary | ICD-10-CM | POA: Diagnosis not present

## 2023-03-22 DIAGNOSIS — Z8701 Personal history of pneumonia (recurrent): Secondary | ICD-10-CM | POA: Diagnosis not present

## 2023-03-22 DIAGNOSIS — Z794 Long term (current) use of insulin: Secondary | ICD-10-CM | POA: Diagnosis not present

## 2023-03-22 DIAGNOSIS — F419 Anxiety disorder, unspecified: Secondary | ICD-10-CM | POA: Diagnosis present

## 2023-03-22 DIAGNOSIS — I8500 Esophageal varices without bleeding: Secondary | ICD-10-CM | POA: Diagnosis not present

## 2023-03-22 DIAGNOSIS — K219 Gastro-esophageal reflux disease without esophagitis: Secondary | ICD-10-CM | POA: Diagnosis present

## 2023-03-22 DIAGNOSIS — Z9181 History of falling: Secondary | ICD-10-CM

## 2023-03-22 DIAGNOSIS — E119 Type 2 diabetes mellitus without complications: Secondary | ICD-10-CM | POA: Diagnosis not present

## 2023-03-22 DIAGNOSIS — K766 Portal hypertension: Secondary | ICD-10-CM | POA: Diagnosis not present

## 2023-03-22 DIAGNOSIS — Z90710 Acquired absence of both cervix and uterus: Secondary | ICD-10-CM | POA: Diagnosis not present

## 2023-03-22 DIAGNOSIS — Z9081 Acquired absence of spleen: Secondary | ICD-10-CM | POA: Diagnosis not present

## 2023-03-22 DIAGNOSIS — M797 Fibromyalgia: Secondary | ICD-10-CM | POA: Diagnosis not present

## 2023-03-22 DIAGNOSIS — M1711 Unilateral primary osteoarthritis, right knee: Secondary | ICD-10-CM | POA: Diagnosis present

## 2023-03-22 DIAGNOSIS — E1165 Type 2 diabetes mellitus with hyperglycemia: Secondary | ICD-10-CM | POA: Diagnosis not present

## 2023-03-22 DIAGNOSIS — Z8673 Personal history of transient ischemic attack (TIA), and cerebral infarction without residual deficits: Secondary | ICD-10-CM

## 2023-03-22 DIAGNOSIS — Z7901 Long term (current) use of anticoagulants: Secondary | ICD-10-CM | POA: Diagnosis not present

## 2023-03-22 DIAGNOSIS — K5792 Diverticulitis of intestine, part unspecified, without perforation or abscess without bleeding: Secondary | ICD-10-CM | POA: Diagnosis not present

## 2023-03-22 DIAGNOSIS — T84022A Instability of internal right knee prosthesis, initial encounter: Secondary | ICD-10-CM | POA: Diagnosis not present

## 2023-03-22 DIAGNOSIS — T84092A Other mechanical complication of internal right knee prosthesis, initial encounter: Secondary | ICD-10-CM

## 2023-03-22 DIAGNOSIS — K7581 Nonalcoholic steatohepatitis (NASH): Secondary | ICD-10-CM | POA: Diagnosis not present

## 2023-03-22 DIAGNOSIS — D6861 Antiphospholipid syndrome: Secondary | ICD-10-CM | POA: Diagnosis not present

## 2023-03-22 DIAGNOSIS — I10 Essential (primary) hypertension: Secondary | ICD-10-CM | POA: Diagnosis not present

## 2023-03-22 DIAGNOSIS — K900 Celiac disease: Secondary | ICD-10-CM | POA: Diagnosis not present

## 2023-03-22 DIAGNOSIS — Z820 Family history of epilepsy and other diseases of the nervous system: Secondary | ICD-10-CM

## 2023-03-22 HISTORY — PX: TOTAL KNEE REVISION: SHX996

## 2023-03-22 LAB — GLUCOSE, CAPILLARY
Glucose-Capillary: 186 mg/dL — ABNORMAL HIGH (ref 70–99)
Glucose-Capillary: 167 mg/dL — ABNORMAL HIGH (ref 70–99)
Glucose-Capillary: 154 mg/dL — ABNORMAL HIGH (ref 70–99)
Glucose-Capillary: 164 mg/dL — ABNORMAL HIGH (ref 70–99)
Glucose-Capillary: 241 mg/dL — ABNORMAL HIGH (ref 70–99)

## 2023-03-22 LAB — TYPE AND SCREEN
ABO/RH(D): AB NEG
Antibody Screen: NEGATIVE

## 2023-03-22 LAB — ABO/RH: ABO/RH(D): AB NEG

## 2023-03-22 SURGERY — TOTAL KNEE REVISION
Anesthesia: General | Site: Knee | Laterality: Right

## 2023-03-22 MED ORDER — DEXAMETHASONE SODIUM PHOSPHATE 10 MG/ML IJ SOLN
INTRAMUSCULAR | Status: AC
  Filled 2023-03-22: qty 1

## 2023-03-22 MED ORDER — OXYCODONE HCL 5 MG PO TABS: 5.0000 mg | ORAL_TABLET | Freq: Once | ORAL | Status: DC | PRN

## 2023-03-22 MED ORDER — ONDANSETRON HCL 4 MG/2ML IJ SOLN
INTRAMUSCULAR | Status: DC | PRN
  Administered 2023-03-22 (×2): 4 mg via INTRAVENOUS

## 2023-03-22 MED ORDER — PHENYLEPHRINE HCL-NACL 20-0.9 MG/250ML-% IV SOLN
INTRAVENOUS | Status: DC | PRN
  Administered 2023-03-22: 20 ug/min via INTRAVENOUS

## 2023-03-22 MED ORDER — INSULIN ASPART 100 UNIT/ML IJ SOLN
0.0000 [IU] | Freq: Three times a day (TID) | INTRAMUSCULAR | Status: DC
  Administered 2023-03-23: 2 [IU] via SUBCUTANEOUS
  Administered 2023-03-23: 3 [IU] via SUBCUTANEOUS

## 2023-03-22 MED ORDER — POLYETHYLENE GLYCOL 3350 17 G PO PACK
17.0000 g | PACK | Freq: Every day | ORAL | Status: DC | PRN
Start: 2023-03-22 — End: 2023-03-23

## 2023-03-22 MED ORDER — ONDANSETRON HCL 4 MG/2ML IJ SOLN
4.0000 mg | Freq: Four times a day (QID) | INTRAMUSCULAR | Status: DC | PRN
Start: 2023-03-22 — End: 2023-03-23
  Administered 2023-03-22 – 2023-03-23 (×2): 4 mg via INTRAVENOUS
  Filled 2023-03-22 (×2): qty 2

## 2023-03-22 MED ORDER — ACETAMINOPHEN 325 MG PO TABS: 325.0000 mg | ORAL_TABLET | Freq: Four times a day (QID) | ORAL | Status: DC | PRN

## 2023-03-22 MED ORDER — ROCURONIUM BROMIDE 10 MG/ML (PF) SYRINGE
PREFILLED_SYRINGE | INTRAVENOUS | Status: AC
  Filled 2023-03-22: qty 10

## 2023-03-22 MED ORDER — ALUM & MAG HYDROXIDE-SIMETH 200-200-20 MG/5ML PO SUSP
30.0000 mL | ORAL | Status: DC | PRN
Start: 2023-03-22 — End: 2023-03-23

## 2023-03-22 MED ORDER — MAGNESIUM OXIDE -MG SUPPLEMENT 400 (240 MG) MG PO TABS
400.0000 mg | ORAL_TABLET | Freq: Every day | ORAL | Status: DC
  Administered 2023-03-23: 400 mg via ORAL
  Filled 2023-03-22 (×2): qty 1

## 2023-03-22 MED ORDER — ALBUMIN HUMAN 5 % IV SOLN
INTRAVENOUS | Status: AC
  Filled 2023-03-22: qty 250

## 2023-03-22 MED ORDER — STERILE WATER FOR IRRIGATION IR SOLN
Status: DC | PRN
  Administered 2023-03-22: 1000 mL

## 2023-03-22 MED ORDER — ACETAMINOPHEN 500 MG PO TABS: 1000.0000 mg | ORAL_TABLET | Freq: Once | ORAL | Status: DC

## 2023-03-22 MED ORDER — DEXAMETHASONE SODIUM PHOSPHATE 10 MG/ML IJ SOLN
INTRAMUSCULAR | Status: DC | PRN
  Administered 2023-03-22: 4 mg via INTRAVENOUS

## 2023-03-22 MED ORDER — SODIUM CHLORIDE (PF) 0.9 % IJ SOLN
INTRAMUSCULAR | Status: AC
  Filled 2023-03-22: qty 10

## 2023-03-22 MED ORDER — POVIDONE-IODINE 10 % EX SWAB: 2.0000 | Freq: Once | CUTANEOUS | Status: DC

## 2023-03-22 MED ORDER — ONDANSETRON HCL 4 MG/2ML IJ SOLN
INTRAMUSCULAR | Status: AC
  Filled 2023-03-22: qty 2

## 2023-03-22 MED ORDER — TRANEXAMIC ACID-NACL 1000-0.7 MG/100ML-% IV SOLN
1000.0000 mg | INTRAVENOUS | Status: AC
  Administered 2023-03-22: 1000 mg via INTRAVENOUS
  Filled 2023-03-22: qty 100

## 2023-03-22 MED ORDER — HYDROMORPHONE HCL 1 MG/ML IJ SOLN
0.5000 mg | INTRAMUSCULAR | Status: DC | PRN
  Administered 2023-03-22: 1 mg via INTRAVENOUS

## 2023-03-22 MED ORDER — CEFAZOLIN SODIUM-DEXTROSE 2-4 GM/100ML-% IV SOLN
2.0000 g | Freq: Four times a day (QID) | INTRAVENOUS | Status: AC
  Administered 2023-03-22 (×2): 2 g via INTRAVENOUS
  Filled 2023-03-22 (×2): qty 100

## 2023-03-22 MED ORDER — PROPOFOL 10 MG/ML IV BOLUS
INTRAVENOUS | Status: AC
  Filled 2023-03-22: qty 20

## 2023-03-22 MED ORDER — ALBUMIN HUMAN 5 % IV SOLN: INTRAVENOUS | Status: DC | PRN

## 2023-03-22 MED ORDER — PANTOPRAZOLE SODIUM 40 MG PO TBEC
40.0000 mg | DELAYED_RELEASE_TABLET | Freq: Every day | ORAL | Status: DC
  Administered 2023-03-23: 40 mg via ORAL
  Filled 2023-03-22 (×2): qty 1

## 2023-03-22 MED ORDER — CEFAZOLIN SODIUM-DEXTROSE 2-4 GM/100ML-% IV SOLN
2.0000 g | INTRAVENOUS | Status: AC
  Administered 2023-03-22: 2 g via INTRAVENOUS
  Filled 2023-03-22: qty 100

## 2023-03-22 MED ORDER — ORAL CARE MOUTH RINSE: 15.0000 mL | Freq: Once | OROMUCOSAL | Status: AC

## 2023-03-22 MED ORDER — AMISULPRIDE (ANTIEMETIC) 5 MG/2ML IV SOLN: 10.0000 mg | Freq: Once | INTRAVENOUS | Status: DC | PRN

## 2023-03-22 MED ORDER — HYDROMORPHONE HCL 2 MG/ML IJ SOLN
INTRAMUSCULAR | Status: AC
  Filled 2023-03-22: qty 1

## 2023-03-22 MED ORDER — LACTATED RINGERS IV SOLN
INTRAVENOUS | Status: DC
Start: 2023-03-22 — End: 2023-03-22

## 2023-03-22 MED ORDER — DEXMEDETOMIDINE HCL IN NACL 80 MCG/20ML IV SOLN
INTRAVENOUS | Status: AC
  Filled 2023-03-22: qty 20

## 2023-03-22 MED ORDER — DOCUSATE SODIUM 100 MG PO CAPS
100.0000 mg | ORAL_CAPSULE | Freq: Two times a day (BID) | ORAL | Status: DC
  Administered 2023-03-22 – 2023-03-23 (×2): 100 mg via ORAL
  Filled 2023-03-22 (×2): qty 1

## 2023-03-22 MED ORDER — METOCLOPRAMIDE HCL 5 MG PO TABS: 5.0000 mg | ORAL_TABLET | Freq: Three times a day (TID) | ORAL | Status: DC | PRN

## 2023-03-22 MED ORDER — APIXABAN 2.5 MG PO TABS
2.5000 mg | ORAL_TABLET | Freq: Two times a day (BID) | ORAL | Status: DC
  Administered 2023-03-23: 2.5 mg via ORAL
  Filled 2023-03-22: qty 1

## 2023-03-22 MED ORDER — 0.9 % SODIUM CHLORIDE (POUR BTL) OPTIME
TOPICAL | Status: DC | PRN
  Administered 2023-03-22: 1000 mL

## 2023-03-22 MED ORDER — METHOCARBAMOL 1000 MG/10ML IJ SOLN: 500.0000 mg | Freq: Four times a day (QID) | INTRAMUSCULAR | Status: DC | PRN

## 2023-03-22 MED ORDER — SUGAMMADEX SODIUM 200 MG/2ML IV SOLN
INTRAVENOUS | Status: DC | PRN
  Administered 2023-03-22: 100 mg via INTRAVENOUS

## 2023-03-22 MED ORDER — OXYCODONE HCL 5 MG PO TABS
ORAL_TABLET | ORAL | Status: AC
  Administered 2023-03-22: 10 mg via ORAL
  Filled 2023-03-22: qty 2

## 2023-03-22 MED ORDER — DEXMEDETOMIDINE HCL IN NACL 80 MCG/20ML IV SOLN
INTRAVENOUS | Status: DC | PRN
  Administered 2023-03-22: 8 ug via INTRAVENOUS
  Administered 2023-03-22 (×2): 4 ug via INTRAVENOUS

## 2023-03-22 MED ORDER — MUPIROCIN 2 % EX OINT
TOPICAL_OINTMENT | Freq: Two times a day (BID) | CUTANEOUS | Status: DC
  Filled 2023-03-22: qty 22

## 2023-03-22 MED ORDER — SENNA 8.6 MG PO TABS
1.0000 | ORAL_TABLET | Freq: Two times a day (BID) | ORAL | Status: DC
  Administered 2023-03-22 – 2023-03-23 (×2): 8.6 mg via ORAL
  Filled 2023-03-22 (×2): qty 1

## 2023-03-22 MED ORDER — OXYCODONE HCL 5 MG PO TABS
5.0000 mg | ORAL_TABLET | ORAL | Status: DC | PRN
  Administered 2023-03-22 – 2023-03-23 (×2): 10 mg via ORAL
  Administered 2023-03-23: 5 mg via ORAL
  Filled 2023-03-22: qty 1
  Filled 2023-03-22 (×4): qty 2

## 2023-03-22 MED ORDER — OXYCODONE HCL 5 MG/5ML PO SOLN: 5.0000 mg | Freq: Once | ORAL | Status: DC | PRN

## 2023-03-22 MED ORDER — OXYCODONE HCL 5 MG PO TABS
10.0000 mg | ORAL_TABLET | ORAL | Status: DC | PRN
  Administered 2023-03-23: 10 mg via ORAL

## 2023-03-22 MED ORDER — PHENOL 1.4 % MT LIQD: 1.0000 | OROMUCOSAL | Status: DC | PRN

## 2023-03-22 MED ORDER — PROPOFOL 1000 MG/100ML IV EMUL
INTRAVENOUS | Status: AC
  Filled 2023-03-22: qty 100

## 2023-03-22 MED ORDER — FENTANYL CITRATE (PF) 100 MCG/2ML IJ SOLN
INTRAMUSCULAR | Status: AC
  Filled 2023-03-22: qty 2

## 2023-03-22 MED ORDER — HYDROMORPHONE HCL 1 MG/ML IJ SOLN
INTRAMUSCULAR | Status: DC | PRN
  Administered 2023-03-22 (×4): .5 mg via INTRAVENOUS

## 2023-03-22 MED ORDER — POVIDONE-IODINE 10 % EX SWAB
2.0000 | Freq: Once | CUTANEOUS | Status: DC
Start: 2023-03-22 — End: 2023-03-22

## 2023-03-22 MED ORDER — PROPOFOL 10 MG/ML IV BOLUS
INTRAVENOUS | Status: DC | PRN
  Administered 2023-03-22: 30 mg via INTRAVENOUS
  Administered 2023-03-22: 100 mg via INTRAVENOUS
  Administered 2023-03-22: 10 mg via INTRAVENOUS

## 2023-03-22 MED ORDER — MENTHOL 3 MG MT LOZG: 1.0000 | LOZENGE | OROMUCOSAL | Status: DC | PRN

## 2023-03-22 MED ORDER — EPHEDRINE 5 MG/ML INJ
INTRAVENOUS | Status: AC
  Filled 2023-03-22: qty 5

## 2023-03-22 MED ORDER — PROPRANOLOL HCL 20 MG PO TABS: 10.0000 mg | ORAL_TABLET | Freq: Four times a day (QID) | ORAL | Status: DC | PRN

## 2023-03-22 MED ORDER — HYDROMORPHONE HCL 1 MG/ML IJ SOLN
INTRAMUSCULAR | Status: AC
  Filled 2023-03-22: qty 1

## 2023-03-22 MED ORDER — HYDROMORPHONE HCL 1 MG/ML IJ SOLN: 0.2500 mg | INTRAMUSCULAR | Status: DC | PRN

## 2023-03-22 MED ORDER — LACTATED RINGERS IV SOLN: INTRAVENOUS | Status: DC

## 2023-03-22 MED ORDER — DIPHENHYDRAMINE HCL 12.5 MG/5ML PO ELIX: 12.5000 mg | ORAL_SOLUTION | ORAL | Status: DC | PRN

## 2023-03-22 MED ORDER — INSULIN ASPART 100 UNIT/ML IJ SOLN
0.0000 [IU] | Freq: Every day | INTRAMUSCULAR | Status: DC
  Administered 2023-03-22: 2 [IU] via SUBCUTANEOUS

## 2023-03-22 MED ORDER — PROPRANOLOL HCL ER 80 MG PO CP24
80.0000 mg | ORAL_CAPSULE | Freq: Every day | ORAL | Status: DC
  Administered 2023-03-23: 80 mg via ORAL
  Filled 2023-03-22: qty 1

## 2023-03-22 MED ORDER — METHOCARBAMOL 500 MG PO TABS
500.0000 mg | ORAL_TABLET | Freq: Four times a day (QID) | ORAL | Status: DC | PRN
Start: 2023-03-22 — End: 2023-03-23
  Administered 2023-03-23: 500 mg via ORAL
  Filled 2023-03-22: qty 1

## 2023-03-22 MED ORDER — ROCURONIUM BROMIDE 100 MG/10ML IV SOLN
INTRAVENOUS | Status: DC | PRN
  Administered 2023-03-22: 40 mg via INTRAVENOUS

## 2023-03-22 MED ORDER — SODIUM CHLORIDE 0.9 % IV SOLN: INTRAVENOUS | Status: DC

## 2023-03-22 MED ORDER — EPHEDRINE SULFATE-NACL 50-0.9 MG/10ML-% IV SOSY
PREFILLED_SYRINGE | INTRAVENOUS | Status: DC | PRN
  Administered 2023-03-22 (×2): 5 mg via INTRAVENOUS
  Administered 2023-03-22: 10 mg via INTRAVENOUS

## 2023-03-22 MED ORDER — METOCLOPRAMIDE HCL 5 MG/ML IJ SOLN
5.0000 mg | Freq: Three times a day (TID) | INTRAMUSCULAR | Status: DC | PRN
  Administered 2023-03-23: 10 mg via INTRAVENOUS
  Filled 2023-03-22: qty 2

## 2023-03-22 MED ORDER — ONDANSETRON HCL 4 MG PO TABS: 4.0000 mg | ORAL_TABLET | Freq: Four times a day (QID) | ORAL | Status: DC | PRN

## 2023-03-22 MED ORDER — CHLORHEXIDINE GLUCONATE 0.12 % MT SOLN
15.0000 mL | Freq: Once | OROMUCOSAL | Status: AC
Start: 2023-03-22 — End: 2023-03-22
  Administered 2023-03-22: 15 mL via OROMUCOSAL

## 2023-03-22 MED ORDER — SODIUM CHLORIDE 0.9 % IR SOLN
Status: DC | PRN
  Administered 2023-03-22: 3000 mL

## 2023-03-22 MED ORDER — ACETAMINOPHEN 500 MG PO TABS
1000.0000 mg | ORAL_TABLET | Freq: Once | ORAL | Status: AC
  Administered 2023-03-22: 1000 mg via ORAL
  Filled 2023-03-22: qty 2

## 2023-03-22 MED ORDER — KETOROLAC TROMETHAMINE 15 MG/ML IJ SOLN
7.5000 mg | Freq: Four times a day (QID) | INTRAMUSCULAR | Status: AC
  Administered 2023-03-22 – 2023-03-23 (×4): 7.5 mg via INTRAVENOUS
  Filled 2023-03-22 (×4): qty 1

## 2023-03-22 MED ORDER — INSULIN ASPART 100 UNIT/ML IJ SOLN
INTRAMUSCULAR | Status: AC
  Administered 2023-03-22: 2 [IU] via SUBCUTANEOUS
  Filled 2023-03-22: qty 1

## 2023-03-22 MED ORDER — PROPOFOL 500 MG/50ML IV EMUL
INTRAVENOUS | Status: DC | PRN
  Administered 2023-03-22: 70 ug/kg/min via INTRAVENOUS

## 2023-03-22 MED ORDER — INSULIN ASPART 100 UNIT/ML IJ SOLN: 0.0000 [IU] | INTRAMUSCULAR | Status: DC | PRN

## 2023-03-22 MED ORDER — FENTANYL CITRATE (PF) 100 MCG/2ML IJ SOLN
INTRAMUSCULAR | Status: DC | PRN
  Administered 2023-03-22 (×2): 50 ug via INTRAVENOUS

## 2023-03-22 MED ORDER — BISACODYL 10 MG RE SUPP: 10.0000 mg | Freq: Every day | RECTAL | Status: DC | PRN

## 2023-03-22 SURGICAL SUPPLY — 77 items
AUG FEM DIST KNEE PS 7 5 (Joint) ×2 IMPLANT
AUGMENT FEM DIST KNEE PS 7 5 (Joint) IMPLANT
BAG COUNTER SPONGE SURGICOUNT (BAG) IMPLANT
BAG DECANTER FOR FLEXI CONT (MISCELLANEOUS) ×2 IMPLANT
BAG ZIPLOCK 12X15 (MISCELLANEOUS) ×1 IMPLANT
BLADE OSTEOTOME VER FLAT 11X50 (ORTHOPEDIC DISPOSABLE SUPPLIES) IMPLANT
BLADE OSTEOTOME VER RD 11X100 (ORTHOPEDIC DISPOSABLE SUPPLIES) IMPLANT
BLADE SAW SGTL 81X20 HD (BLADE) ×1 IMPLANT
BLADE SURG SZ10 CARB STEEL (BLADE) ×1 IMPLANT
BNDG ELASTIC 4INX 5YD STR LF (GAUZE/BANDAGES/DRESSINGS) ×1 IMPLANT
BNDG ELASTIC 6INX 5YD STR LF (GAUZE/BANDAGES/DRESSINGS) ×1 IMPLANT
CEMENT BONE REFOBACIN R1X40 US (Cement) IMPLANT
CEMENT RESTRICTOR BONE PREP ST (Cement) IMPLANT
CHLORAPREP W/TINT 26 (MISCELLANEOUS) ×2 IMPLANT
COMP FEM CMT PS PLUS 7 RT (Joint) ×1 IMPLANT
COMP TIB PERS KNEE SZ D RT (Joint) ×1 IMPLANT
COMPONENT FEM CMT PS PLUS 7 RT (Joint) IMPLANT
COMPONENT TIB PERS KN SZD RT (Joint) IMPLANT
CONE TIB CENTRAL TM MED (Joint) IMPLANT
COVER SURGICAL LIGHT HANDLE (MISCELLANEOUS) ×1 IMPLANT
CUFF TRNQT CYL 34X4.125X (TOURNIQUET CUFF) ×1 IMPLANT
DERMABOND ADVANCED .7 DNX12 (GAUZE/BANDAGES/DRESSINGS) ×2 IMPLANT
DRAPE INCISE IOBAN 66X45 STRL (DRAPES) IMPLANT
DRAPE SHEET LG 3/4 BI-LAMINATE (DRAPES) ×3 IMPLANT
DRAPE U-SHAPE 47X51 STRL (DRAPES) ×1 IMPLANT
DRSG AQUACEL AG ADV 3.5X10 (GAUZE/BANDAGES/DRESSINGS) ×1 IMPLANT
DRSG AQUACEL AG ADV 3.5X14 (GAUZE/BANDAGES/DRESSINGS) IMPLANT
DRSG TEGADERM 4X4.75 (GAUZE/BANDAGES/DRESSINGS) ×1 IMPLANT
ELECT BLADE TIP CTD 4 INCH (ELECTRODE) ×1 IMPLANT
ELECT REM PT RETURN 15FT ADLT (MISCELLANEOUS) ×1 IMPLANT
EVACUATOR 1/8 PVC DRAIN (DRAIN) ×1 IMPLANT
GLOVE BIO SURGEON STRL SZ7 (GLOVE) ×2 IMPLANT
GLOVE BIO SURGEON STRL SZ8.5 (GLOVE) ×2 IMPLANT
GLOVE BIOGEL PI IND STRL 7.5 (GLOVE) ×1 IMPLANT
GLOVE BIOGEL PI IND STRL 8.5 (GLOVE) ×1 IMPLANT
GOWN SPEC L3 XXLG W/TWL (GOWN DISPOSABLE) ×1 IMPLANT
GOWN STRL REUS W/ TWL XL LVL3 (GOWN DISPOSABLE) ×1 IMPLANT
HOLDER FOLEY CATH W/STRAP (MISCELLANEOUS) IMPLANT
HOOD PEEL AWAY T7 (MISCELLANEOUS) ×3 IMPLANT
INSTR SCRW HEX REV FIX 3.5X48 (ORTHOPEDIC DISPOSABLE SUPPLIES) ×1
INSTRUMENT SCRW HEX REV 3.5X48 (ORTHOPEDIC DISPOSABLE SUPPLIES) IMPLANT
KIT TURNOVER KIT A (KITS) IMPLANT
LINER TIB ASF PS CD/6-9 12 RT (Liner) IMPLANT
MANIFOLD NEPTUNE II (INSTRUMENTS) ×1 IMPLANT
MARKER SKIN DUAL TIP RULER LAB (MISCELLANEOUS) ×1 IMPLANT
NDL SPNL 18GX3.5 QUINCKE PK (NEEDLE) ×1 IMPLANT
NEEDLE SPNL 18GX3.5 QUINCKE PK (NEEDLE) ×1
NS IRRIG 1000ML POUR BTL (IV SOLUTION) ×1 IMPLANT
PACK TOTAL KNEE CUSTOM (KITS) ×1 IMPLANT
PADDING CAST COTTON 6X4 STRL (CAST SUPPLIES) ×1 IMPLANT
PIN DRILL HDLS TROCAR 75 4PK (PIN) IMPLANT
POST FEM CENTRAL SZ SM LT/RT (Joint) IMPLANT
PROTECTOR NERVE ULNAR (MISCELLANEOUS) ×1 IMPLANT
RESTRICTOR CEMENT SZ 5 C-STEM (Cement) IMPLANT
SAW OSC TIP CART 19.5X105X1.3 (SAW) ×1 IMPLANT
SCREW HEX HEADED 3.5X27 DISP (ORTHOPEDIC DISPOSABLE SUPPLIES) IMPLANT
SEALER BIPOLAR AQUA 6.0 (INSTRUMENTS) ×1 IMPLANT
SET HNDPC FAN SPRY TIP SCT (DISPOSABLE) ×1 IMPLANT
SET PAD KNEE POSITIONER (MISCELLANEOUS) ×1 IMPLANT
SOLUTION PRONTOSAN WOUND 350ML (IRRIGATION / IRRIGATOR) ×1 IMPLANT
SPIKE FLUID TRANSFER (MISCELLANEOUS) IMPLANT
SPONGE DRAIN TRACH 4X4 STRL 2S (GAUZE/BANDAGES/DRESSINGS) ×1 IMPLANT
STAPLER SKIN PROX WIDE 3.9 (STAPLE) IMPLANT
STEM EXT KNEE 6 OFFSET 15X135 (Miscellaneous) IMPLANT
STEM STRT SPLINE PS 16X135 (Stem) IMPLANT
SUT MNCRL AB 3-0 PS2 18 (SUTURE) ×1 IMPLANT
SUT MON AB 2-0 CT1 36 (SUTURE) ×2 IMPLANT
SUT STRATAFIX 14 PDO 48 VLT (SUTURE) ×1 IMPLANT
SUT STRATAFIX PDO 1 14 VIOLET (SUTURE) ×1
SUT VIC AB 1 CTX36XBRD ANBCTR (SUTURE) ×2 IMPLANT
SUT VIC AB 2-0 CT1 TAPERPNT 27 (SUTURE) ×1 IMPLANT
TOWEL GREEN STERILE FF (TOWEL DISPOSABLE) ×1 IMPLANT
TOWER CARTRIDGE SMART MIX (DISPOSABLE) ×2 IMPLANT
TRAY FOLEY MTR SLVR 16FR STAT (SET/KITS/TRAYS/PACK) ×1 IMPLANT
TUBE SUCTION HIGH CAP CLEAR NV (SUCTIONS) ×1 IMPLANT
WATER STERILE IRR 1000ML POUR (IV SOLUTION) ×1 IMPLANT
WRAP KNEE MAXI GEL POST OP (GAUZE/BANDAGES/DRESSINGS) ×1 IMPLANT

## 2023-03-22 NOTE — H&P (Signed)
TOTAL KNEE REVISION ADMISSION H&P  Patient is being admitted for right revision total knee arthroplasty.  Subjective:  Chief Complaint:right knee pain.  HPI: Laurie Green, 77 y.o. female, has a history of pain and functional disability in the right knee(s) due to failed previous arthroplasty 07/21/20 with Dr. Shelle Iron, and patient has failed non-surgical conservative treatments for greater than 12 weeks to include NSAID's and/or analgesics, flexibility and strengthening excercises, supervised PT with diminished ADL's post treatment, use of assistive devices, and activity modification. The indications for the revision of the total knee arthroplasty are  flexion instability . Onset of symptoms was gradual starting 2 years ago with rapidlly worsening course since that time.  Prior procedures on the right knee(s) include arthroplasty.  Patient currently rates pain in the right knee(s) at 10 out of 10 with activity. There is night pain, worsening of pain with activity and weight bearing, pain that interferes with activities of daily living, and pain with passive range of motion.  Patient has evidence of   cemented right total knee arthroplasty in excellent alignment. No osteolysis or loosening. No asymmetric polyliner wear. No lateral patellar subluxation or tilt. No fracture, dislocation, or osseous lesion. . This condition presents safety issues increasing the risk of falls.   There is no current active infection.  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/21/2020   Antiphospholipid antibody with hypercoagulable state (HCC) 11/07/2012   Transaminitis 09/07/2012   Fever, unspecified 09/07/2012   Abdominal pain 09/04/2012   Subtherapeutic international normalized ratio (INR) 09/04/2012   Diabetes mellitus (HCC) 09/04/2012   Leukocytosis 09/04/2012   Varices, esophageal (HCC) 09/04/2012   Transaminasemia 09/04/2012   DVT (deep venous thrombosis)  (HCC) 05/01/2011   Varices 05/01/2011   NASH (nonalcoholic steatohepatitis) 05/01/2011   Incisional hernia 02/14/2011   Past Medical History:  Diagnosis Date   Anginal pain (HCC)    Anti-cardiolipin antibody syndrome (HCC)    antiphospholipid antibody syndrome   Antiphospholipid antibody with hypercoagulable state (HCC) 11/07/2012   Anxiety    Arthritis    Blood dyscrasia    anticardiolipid syndrome   Blood transfusion    age 86 yr old-none since   Celiac disease    Diabetes mellitus    Type II   Diverticulitis    DVT (deep venous thrombosis) (HCC) 05/01/2011   "to liver"   Family history of adverse reaction to anesthesia    N&V   Fibromyalgia    GERD (gastroesophageal reflux disease)    11/01/16- not problem   H/O seasonal allergies    Heart murmur    "not a problem"   Hypertension    Nausea & vomiting    11-17-14 "nausea and vomiting after meals" at present discussed with Dr. Randa Evens office per pt.   Pneumonia 2021   PONV (postoperative nausea and vomiting)    'I dont have it if they give me something in the IV."   Portal hypertension (HCC)    Stroke (HCC)    tia- eye,   TIA (transient ischemic attack)    Varices 05/01/2011   Varices, esophageal (HCC)    Varices, gastric     Past Surgical History:  Procedure Laterality Date   ABDOMINAL HYSTERECTOMY     ANKLE FRACTURE SURGERY Left    retained hardware   BIOPSY  05/03/2022   Procedure: BIOPSY;  Surgeon: Willis Modena, MD;  Location: [facility];  Service: Gastroenterology;;   [place]  COLON SURGERY     hx. diverticulitis   COLONOSCOPY N/A 11/23/2014   Procedure: COLONOSCOPY;  Surgeon: Carman Ching, MD;  Location: [facility];  Service: Endoscopy;  Laterality: N/A;   ESOPHAGOGASTRODUODENOSCOPY  04/28/2011   Procedure: ESOPHAGOGASTRODUODENOSCOPY (EGD);  Surgeon: Vertell Novak., MD;  Location: [facility] ENDOSCOPY;  Service: Endoscopy;  Laterality: N/A;   ESOPHAGOGASTRODUODENOSCOPY N/A 11/23/2014    Procedure: ESOPHAGOGASTRODUODENOSCOPY (EGD);  Surgeon: Carman Ching, MD;  Location: [facility];  Service: Endoscopy;  Laterality: N/A;   ESOPHAGOGASTRODUODENOSCOPY (EGD) WITH PROPOFOL N/A 11/06/2016   Procedure: ESOPHAGOGASTRODUODENOSCOPY (EGD) WITH PROPOFOL;  Surgeon: Carman Ching, MD;  Location: [facility] ENDOSCOPY;  Service: Endoscopy;  Laterality: N/A;   ESOPHAGOGASTRODUODENOSCOPY (EGD) WITH PROPOFOL Bilateral 05/03/2022   Procedure: ESOPHAGOGASTRODUODENOSCOPY (EGD) WITH PROPOFOL;  Surgeon: Willis Modena, MD;  Location: [facility];  Service: Gastroenterology;  Laterality: Bilateral;   EUS N/A 09/25/2012   Procedure: ESOPHAGEAL ENDOSCOPIC ULTRASOUND (EUS) RADIAL;  Surgeon: Willis Modena, MD;  Location: [facility];  Service: Endoscopy;  Laterality: N/A;   HERNIA REPAIR     luq incisional   IVC FILTER INSERTION N/A 07/19/2020   Procedure: IVC FILTER INSERTION;  Surgeon: Maeola Harman, MD;  Location: [facility] INVASIVE CV LAB;  Service: Cardiovascular;  Laterality: N/A;   IVC FILTER INSERTION N/A 03/05/2023   Procedure: IVC FILTER INSERTION;  Surgeon: Maeola Harman, MD;  Location: [facility] INVASIVE CV LAB;  Service: Cardiovascular;  Laterality: N/A;   IVC FILTER REMOVAL N/A 12/12/2021   Procedure: IVC FILTER REMOVAL;  Surgeon: Maeola Harman, MD;  Location: [facility] INVASIVE CV LAB;  Service: Cardiovascular;  Laterality: N/A;   IVC VENOGRAPHY N/A 12/12/2021   Procedure: IVC Venography;  Surgeon: Maeola Harman, MD;  Location: [facility] INVASIVE CV LAB;  Service: Cardiovascular;  Laterality: N/A;   SPLENECTOMY, TOTAL     TONSILLECTOMY     TOTAL KNEE ARTHROPLASTY Right 07/21/2020   Procedure: TOTAL KNEE ARTHROPLASTY;  Surgeon: Jene Every, MD;  Location: [facility] ORS;  Service: Orthopedics;  Laterality: Right;  2.5 hrs    No current facility-administered medications for this visit.   No current outpatient medications on file.   Facility-Administered Medications Ordered in Other  Visits  Medication Dose Route Frequency Provider Last Rate Last Admin   ceFAZolin (ANCEF) IVPB 2g/100 mL premix  2 g Intravenous On Call to OR Clois Dupes, PA-C       insulin aspart (novoLOG) injection 0-7 Units  0-7 Units Subcutaneous Q2H PRN [name], MD   2 Units at 03/22/23 8469   lactated ringers infusion   Intravenous Continuous Jenevieve Kirschbaum S, PA-C       lactated ringers infusion   Intravenous Continuous Eilene Ghazi, MD 10 mL/hr at 03/22/23 0912 New Bag at 03/22/23 0912   povidone-iodine 10 % swab 2 Application  2 Application Topical Once Idamae Coccia S, PA-C       povidone-iodine 10 % swab 2 Application  2 Application Topical Once [place], Davion Flannery S, PA-C       tranexamic acid (CYKLOKAPRON) IVPB 1,000 mg  1,000 mg Intravenous To OR Bryley Chrisman S, PA-C       Allergies  Allergen Reactions   Lidocaine Hcl Anaphylaxis   Piperacillin Sod-Tazobactam So Itching   Cyclosporine Other (See Comments)    burns   Hydromorphone Hcl Nausea And Vomiting    Can take with zofran   Morphine And Codeine Nausea And Vomiting   Percocet [Oxycodone-Acetaminophen] Nausea And Vomiting   Tape Itching    Social  History   Tobacco Use   Smoking status: Never   Smokeless tobacco: Never   Tobacco comments:    never used tobacco  Substance Use Topics   Alcohol use: No    Alcohol/week: 0.0 standard drinks of alcohol    Family History  Problem Relation Age of Onset   Cancer Mother    Alzheimer's disease Mother    Cancer Father    Heart defect Father       Review of Systems  Musculoskeletal:  Positive for arthralgias and gait problem.  All other systems reviewed and are negative.    Objective:  Physical Exam Constitutional:      Appearance: Normal appearance.  HENT:     Head: Normocephalic and atraumatic.     Nose: Nose normal.     Mouth/Throat:     Mouth: Mucous membranes are moist.     Pharynx: Oropharynx is clear.  Eyes:     Conjunctiva/sclera: Conjunctivae normal.   Cardiovascular:     Rate and Rhythm: Normal rate and regular rhythm.     Pulses: Normal pulses.     Heart sounds: Normal heart sounds.  Pulmonary:     Effort: Pulmonary effort is normal.     Breath sounds: Normal breath sounds.  Abdominal:     General: Abdomen is flat.     Palpations: Abdomen is soft.  Genitourinary:    Comments: deferred Musculoskeletal:     Cervical back: Normal range of motion and neck supple.     Comments: Examination of the right knee reveals a healed anterior incision. Mild swelling. No warmth, erythema, or effusion. She has some tenderness to palpation over the medial aspect of the tibial component. Range of motion is 10 to 120 degrees. In full extension, there is no laxity or instability. She does have some laxity in flexion and mid flexion, however she guards during exam. Painless logrolling of the hip  Neurovascularly intact distally.  Skin:    General: Skin is warm and dry.     Capillary Refill: Capillary refill takes less than 2 seconds.  Neurological:     General: No focal deficit present.     Mental Status: She is alert and oriented to person, place, and time.  Psychiatric:        Mood and Affect: Mood normal.        Behavior: Behavior normal.        Thought Content: Thought content normal.        Judgment: Judgment normal.     Vital signs in last 24 hours: @VSRANGES @  Labs:  Estimated body mass index is 23.54 kg/m as calculated from the following:   Height as of an earlier encounter on 03/22/23: 5' 3.5" (1.613 m).   Weight as of an earlier encounter on 03/22/23: 61.2 kg.  Imaging Review Plain radiographs demonstrate severe degenerative joint disease of the right knee(s). The overall alignment is neutral. She has flexion instability right knee. The bone quality appears to be adequate for age and reported activity level..    Assessment/Plan:  End stage arthritis, right knee(s) with failed previous arthroplasty.   The patient history,  physical examination, clinical judgment of the provider and imaging studies are consistent with end stage degenerative joint disease of the right knee(s), previous total knee arthroplasty. Revision total knee arthroplasty is deemed medically necessary. The treatment options including medical management, injection therapy, arthroscopy and revision arthroplasty were discussed at length. The risks and benefits of revision total knee arthroplasty were presented  and reviewed. The risks due to aseptic loosening, infection, stiffness, patella tracking problems, thromboembolic complications and other imponderables were discussed. The patient acknowledged the explanation, agreed to proceed with the plan and consent was signed. Patient is being admitted for inpatient treatment for surgery, pain control, PT, OT, prophylactic antibiotics, VTE prophylaxis, progressive ambulation and ADL's and discharge planning.The patient is planning to be discharged home with OPPT after an overnight stay.   Therapy Plans: outpatient therapy. PT at [facility] 03/26/23.  Disposition: Home with husband. Planned DVT Prophylaxis: Eliquis 5mg  BID DME needed: Has rolling walker. Ice machine today.  PCP: Cleared.  Cardiology: Cleared.  Hematology/Oncology: Stop Eliquis 3 days prior to surgery. Resume 1 day PO.  TXA: IV Allergies:  - Cyclosporine - itching - Latex - itching.  - Hydrocodone - N/V. - Hydromorphone - N/V can take with zofran. - Pipercillin- Tazobactam - itching.  - Lidocaine - anaphylaxis.  Anesthesia Concerns: Severe N/V with anesthesia. Lidocaine allergy. General last time due to lidocaine allergy.  BMI: 25.5 Last HgbA1c: 7.3 Other: - Antiphospholipid syndrome on Eliquis, DVT history. Stop 3 days prior to surgery.  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  - IVC filter placed 03/05/23, Dr. Randie Heinz.  - Type 2 diabetes on insulin. - TIA history eye and portal vein.  - Polyarthralgia. - Heart murmur.  -  Oxycodone vs dilaudid, zofran.  - 03/12/23: Hgb 13.9, K+ 3.8, Cr. 0.75. - Mupirocin.

## 2023-03-22 NOTE — Anesthesia Postprocedure Evaluation (Signed)
Anesthesia Post Note  Patient: Laurie Green  Procedure(s) Performed: TOTAL KNEE REVISION (Right: Knee)     Patient location during evaluation: PACU Anesthesia Type: General Level of consciousness: awake Pain management: pain level controlled Vital Signs Assessment: post-procedure vital signs reviewed and stable Respiratory status: spontaneous breathing, nonlabored ventilation and respiratory function stable Cardiovascular status: blood pressure returned to baseline and stable Postop Assessment: no apparent nausea or vomiting Anesthetic complications: no   No notable events documented.  Last Vitals:  Vitals:   03/22/23 1630 03/22/23 1643  BP: (!) 113/55 (!) 120/57  Pulse: 62 61  Resp: 10   Temp:  36.4 C  SpO2: 97% 97%    Last Pain:  Vitals:   03/22/23 1932  TempSrc:   PainSc: 10-Worst pain ever                 [name]

## 2023-03-22 NOTE — Plan of Care (Signed)
  Problem: Education: Goal: Knowledge of General Education information will improve Description: Including pain rating scale, medication(s)/side effects and non-pharmacologic comfort measures Outcome: Progressing   Problem: Health Behavior/Discharge Planning: Goal: Ability to manage health-related needs will improve Outcome: Progressing   Problem: Clinical Measurements: Goal: Ability to maintain clinical measurements within normal limits will improve Outcome: Progressing Goal: Will remain free from infection Outcome: Progressing Goal: Diagnostic test results will improve Outcome: Progressing Goal: Respiratory complications will improve Outcome: Progressing Goal: Cardiovascular complication will be avoided Outcome: Progressing   Problem: Activity: Goal: Risk for activity intolerance will decrease Outcome: Progressing   Problem: Nutrition: Goal: Adequate nutrition will be maintained Outcome: Progressing   Problem: Coping: Goal: Level of anxiety will decrease Outcome: Progressing   Problem: Elimination: Goal: Will not experience complications related to bowel motility Outcome: Progressing Goal: Will not experience complications related to urinary retention Outcome: Progressing   Problem: Pain Management: Goal: General experience of comfort will improve Outcome: Progressing   Problem: Safety: Goal: Ability to remain free from injury will improve Outcome: Progressing   Problem: Skin Integrity: Goal: Risk for impaired skin integrity will decrease Outcome: Progressing   Problem: Education: Goal: Ability to describe self-care measures that may prevent or decrease complications (Diabetes Survival Skills Education) will improve Outcome: Progressing Goal: Individualized Educational Video(s) Outcome: Progressing   Problem: Coping: Goal: Ability to adjust to condition or change in health will improve Outcome: Progressing   Problem: Fluid Volume: Goal: Ability to  maintain a balanced intake and output will improve Outcome: Progressing   Problem: Health Behavior/Discharge Planning: Goal: Ability to identify and utilize available resources and services will improve Outcome: Progressing Goal: Ability to manage health-related needs will improve Outcome: Progressing   Problem: Metabolic: Goal: Ability to maintain appropriate glucose levels will improve Outcome: Progressing   Problem: Nutritional: Goal: Maintenance of adequate nutrition will improve Outcome: Progressing Goal: Progress toward achieving an optimal weight will improve Outcome: Progressing   Problem: Skin Integrity: Goal: Risk for impaired skin integrity will decrease Outcome: Progressing   Problem: Tissue Perfusion: Goal: Adequacy of tissue perfusion will improve Outcome: Progressing   Problem: Education: Goal: Knowledge of the prescribed therapeutic regimen will improve Outcome: Progressing Goal: Individualized Educational Video(s) Outcome: Progressing   Problem: Activity: Goal: Ability to avoid complications of mobility impairment will improve Outcome: Progressing Goal: Range of joint motion will improve Outcome: Progressing   Problem: Clinical Measurements: Goal: Postoperative complications will be avoided or minimized Outcome: Progressing   Problem: Pain Management: Goal: Pain level will decrease with appropriate interventions Outcome: Progressing   Problem: Skin Integrity: Goal: Will show signs of wound healing Outcome: Progressing

## 2023-03-22 NOTE — Interval H&P Note (Signed)
History and Physical Interval Note:  03/22/2023 10:10 AM  Laurie Green  has presented today for surgery, with the diagnosis of Failed right total knee arthroplasty.  The various methods of treatment have been discussed with the patient and family. After consideration of risks, benefits and other options for treatment, the patient has consented to  Procedure(s): TOTAL KNEE REVISION (Right) as a surgical intervention.  The patient's history has been reviewed, patient examined, no change in status, stable for surgery.  I have reviewed the patient's chart and labs.  Questions were answered to the patient's satisfaction.    The risks, benefits, and alternatives were discussed with the patient. There are risks associated with the surgery including, but not limited to, problems with anesthesia (death), infection, instability (giving out of the joint), dislocation, differences in leg length/angulation/rotation, fracture of bones, loosening or failure of implants, hematoma (blood accumulation) which may require surgical drainage, blood clots, pulmonary embolism, nerve injury (foot drop and lateral thigh numbness), and blood vessel injury. The patient understands these risks and elects to proceed.    Iline Oven Taryne Kiger

## 2023-03-22 NOTE — Transfer of Care (Signed)
Immediate Anesthesia Transfer of Care Note  Patient: Laurie Green  Procedure(s) Performed: TOTAL KNEE REVISION (Right: Knee)  Patient Location: PACU  Anesthesia Type:General  Level of Consciousness: drowsy and patient cooperative  Airway & Oxygen Therapy: Patient Spontanous Breathing and Patient connected to nasal cannula oxygen  Post-op Assessment: Report given to RN and Post -op Vital signs reviewed and stable  Post vital signs: Reviewed and stable  Last Vitals:  Vitals Value Taken Time  BP 137/67 03/22/23 1415  Temp    Pulse 67 03/22/23 1417  Resp 8 03/22/23 1417  SpO2 95 % 03/22/23 1417  Vitals shown include unfiled device data.  Last Pain:  Vitals:   03/22/23 0901  TempSrc:   PainSc: 5       Patients Stated Pain Goal: 5 (03/22/23 0901)  Complications: No notable events documented.

## 2023-03-22 NOTE — Anesthesia Procedure Notes (Signed)
Procedure Name: Intubation Date/Time: 03/22/2023 10:46 AM  Performed by: Maurene Capes, CRNAPre-anesthesia Checklist: Patient identified, Emergency Drugs available, Suction available and Patient being monitored Patient Re-evaluated:Patient Re-evaluated prior to induction Oxygen Delivery Method: Circle System Utilized Preoxygenation: Pre-oxygenation with 100% oxygen Induction Type: IV induction Ventilation: Mask ventilation without difficulty Laryngoscope Size: Mac and 3 Grade View: Grade II Tube type: Oral Tube size: 7.0 mm Number of attempts: 1 Airway Equipment and Method: Stylet Placement Confirmation: ETT inserted through vocal cords under direct vision, positive ETCO2 and breath sounds checked- equal and bilateral Secured at: 20 cm Tube secured with: Tape Dental Injury: Teeth and Oropharynx as per pre-operative assessment

## 2023-03-22 NOTE — H&P (View-Only) (Signed)
TOTAL KNEE REVISION ADMISSION H&P  Patient is being admitted for right revision total knee arthroplasty.  Subjective:  Chief Complaint:right knee pain.  HPI: Laurie Green, 77 y.o. female, has a history of pain and functional disability in the right knee(s) due to failed previous arthroplasty 07/21/20 with Dr. Shelle Iron, and patient has failed non-surgical conservative treatments for greater than 12 weeks to include NSAID's and/or analgesics, flexibility and strengthening excercises, supervised PT with diminished ADL's post treatment, use of assistive devices, and activity modification. The indications for the revision of the total knee arthroplasty are  flexion instability . Onset of symptoms was gradual starting 2 years ago with rapidlly worsening course since that time.  Prior procedures on the right knee(s) include arthroplasty.  Patient currently rates pain in the right knee(s) at 10 out of 10 with activity. There is night pain, worsening of pain with activity and weight bearing, pain that interferes with activities of daily living, and pain with passive range of motion.  Patient has evidence of   cemented right total knee arthroplasty in excellent alignment. No osteolysis or loosening. No asymmetric polyliner wear. No lateral patellar subluxation or tilt. No fracture, dislocation, or osseous lesion. . This condition presents safety issues increasing the risk of falls.   There is no current active infection.  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/21/2020   Antiphospholipid antibody with hypercoagulable state (HCC) 11/07/2012   Transaminitis 09/07/2012   Fever, unspecified 09/07/2012   Abdominal pain 09/04/2012   Subtherapeutic international normalized ratio (INR) 09/04/2012   Diabetes mellitus (HCC) 09/04/2012   Leukocytosis 09/04/2012   Varices, esophageal (HCC) 09/04/2012   Transaminasemia 09/04/2012   DVT (deep venous thrombosis)  (HCC) 05/01/2011   Varices 05/01/2011   NASH (nonalcoholic steatohepatitis) 05/01/2011   Incisional hernia 02/14/2011   Past Medical History:  Diagnosis Date   Anginal pain (HCC)    Anti-cardiolipin antibody syndrome (HCC)    antiphospholipid antibody syndrome   Antiphospholipid antibody with hypercoagulable state (HCC) 11/07/2012   Anxiety    Arthritis    Blood dyscrasia    anticardiolipid syndrome   Blood transfusion    age 86 yr old-none since   Celiac disease    Diabetes mellitus    Type II   Diverticulitis    DVT (deep venous thrombosis) (HCC) 05/01/2011   "to liver"   Family history of adverse reaction to anesthesia    N&V   Fibromyalgia    GERD (gastroesophageal reflux disease)    11/01/16- not problem   H/O seasonal allergies    Heart murmur    "not a problem"   Hypertension    Nausea & vomiting    11-17-14 "nausea and vomiting after meals" at present discussed with Dr. Randa Evens office per pt.   Pneumonia 2021   PONV (postoperative nausea and vomiting)    'I dont have it if they give me something in the IV."   Portal hypertension (HCC)    Stroke (HCC)    tia- eye,   TIA (transient ischemic attack)    Varices 05/01/2011   Varices, esophageal (HCC)    Varices, gastric     Past Surgical History:  Procedure Laterality Date   ABDOMINAL HYSTERECTOMY     ANKLE FRACTURE SURGERY Left    retained hardware   BIOPSY  05/03/2022   Procedure: BIOPSY;  Surgeon: Willis Modena, MD;  Location: [facility];  Service: Gastroenterology;;   [place]  COLON SURGERY     hx. diverticulitis   COLONOSCOPY N/A 11/23/2014   Procedure: COLONOSCOPY;  Surgeon: Carman Ching, MD;  Location: [facility];  Service: Endoscopy;  Laterality: N/A;   ESOPHAGOGASTRODUODENOSCOPY  04/28/2011   Procedure: ESOPHAGOGASTRODUODENOSCOPY (EGD);  Surgeon: Vertell Novak., MD;  Location: [facility] ENDOSCOPY;  Service: Endoscopy;  Laterality: N/A;   ESOPHAGOGASTRODUODENOSCOPY N/A 11/23/2014    Procedure: ESOPHAGOGASTRODUODENOSCOPY (EGD);  Surgeon: Carman Ching, MD;  Location: [facility];  Service: Endoscopy;  Laterality: N/A;   ESOPHAGOGASTRODUODENOSCOPY (EGD) WITH PROPOFOL N/A 11/06/2016   Procedure: ESOPHAGOGASTRODUODENOSCOPY (EGD) WITH PROPOFOL;  Surgeon: Carman Ching, MD;  Location: [facility] ENDOSCOPY;  Service: Endoscopy;  Laterality: N/A;   ESOPHAGOGASTRODUODENOSCOPY (EGD) WITH PROPOFOL Bilateral 05/03/2022   Procedure: ESOPHAGOGASTRODUODENOSCOPY (EGD) WITH PROPOFOL;  Surgeon: Willis Modena, MD;  Location: [facility];  Service: Gastroenterology;  Laterality: Bilateral;   EUS N/A 09/25/2012   Procedure: ESOPHAGEAL ENDOSCOPIC ULTRASOUND (EUS) RADIAL;  Surgeon: Willis Modena, MD;  Location: [facility];  Service: Endoscopy;  Laterality: N/A;   HERNIA REPAIR     luq incisional   IVC FILTER INSERTION N/A 07/19/2020   Procedure: IVC FILTER INSERTION;  Surgeon: Maeola Harman, MD;  Location: [facility] INVASIVE CV LAB;  Service: Cardiovascular;  Laterality: N/A;   IVC FILTER INSERTION N/A 03/05/2023   Procedure: IVC FILTER INSERTION;  Surgeon: Maeola Harman, MD;  Location: [facility] INVASIVE CV LAB;  Service: Cardiovascular;  Laterality: N/A;   IVC FILTER REMOVAL N/A 12/12/2021   Procedure: IVC FILTER REMOVAL;  Surgeon: Maeola Harman, MD;  Location: [facility] INVASIVE CV LAB;  Service: Cardiovascular;  Laterality: N/A;   IVC VENOGRAPHY N/A 12/12/2021   Procedure: IVC Venography;  Surgeon: Maeola Harman, MD;  Location: [facility] INVASIVE CV LAB;  Service: Cardiovascular;  Laterality: N/A;   SPLENECTOMY, TOTAL     TONSILLECTOMY     TOTAL KNEE ARTHROPLASTY Right 07/21/2020   Procedure: TOTAL KNEE ARTHROPLASTY;  Surgeon: Jene Every, MD;  Location: [facility] ORS;  Service: Orthopedics;  Laterality: Right;  2.5 hrs    No current facility-administered medications for this visit.   No current outpatient medications on file.   Facility-Administered Medications Ordered in Other  Visits  Medication Dose Route Frequency Provider Last Rate Last Admin   ceFAZolin (ANCEF) IVPB 2g/100 mL premix  2 g Intravenous On Call to OR Clois Dupes, PA-C       insulin aspart (novoLOG) injection 0-7 Units  0-7 Units Subcutaneous Q2H PRN [name], MD   2 Units at 03/22/23 8469   lactated ringers infusion   Intravenous Continuous Jenevieve Kirschbaum S, PA-C       lactated ringers infusion   Intravenous Continuous Eilene Ghazi, MD 10 mL/hr at 03/22/23 0912 New Bag at 03/22/23 0912   povidone-iodine 10 % swab 2 Application  2 Application Topical Once Idamae Coccia S, PA-C       povidone-iodine 10 % swab 2 Application  2 Application Topical Once [place], Davion Flannery S, PA-C       tranexamic acid (CYKLOKAPRON) IVPB 1,000 mg  1,000 mg Intravenous To OR Bryley Chrisman S, PA-C       Allergies  Allergen Reactions   Lidocaine Hcl Anaphylaxis   Piperacillin Sod-Tazobactam So Itching   Cyclosporine Other (See Comments)    burns   Hydromorphone Hcl Nausea And Vomiting    Can take with zofran   Morphine And Codeine Nausea And Vomiting   Percocet [Oxycodone-Acetaminophen] Nausea And Vomiting   Tape Itching    Social  History   Tobacco Use   Smoking status: Never   Smokeless tobacco: Never   Tobacco comments:    never used tobacco  Substance Use Topics   Alcohol use: No    Alcohol/week: 0.0 standard drinks of alcohol    Family History  Problem Relation Age of Onset   Cancer Mother    Alzheimer's disease Mother    Cancer Father    Heart defect Father       Review of Systems  Musculoskeletal:  Positive for arthralgias and gait problem.  All other systems reviewed and are negative.    Objective:  Physical Exam Constitutional:      Appearance: Normal appearance.  HENT:     Head: Normocephalic and atraumatic.     Nose: Nose normal.     Mouth/Throat:     Mouth: Mucous membranes are moist.     Pharynx: Oropharynx is clear.  Eyes:     Conjunctiva/sclera: Conjunctivae normal.   Cardiovascular:     Rate and Rhythm: Normal rate and regular rhythm.     Pulses: Normal pulses.     Heart sounds: Normal heart sounds.  Pulmonary:     Effort: Pulmonary effort is normal.     Breath sounds: Normal breath sounds.  Abdominal:     General: Abdomen is flat.     Palpations: Abdomen is soft.  Genitourinary:    Comments: deferred Musculoskeletal:     Cervical back: Normal range of motion and neck supple.     Comments: Examination of the right knee reveals a healed anterior incision. Mild swelling. No warmth, erythema, or effusion. She has some tenderness to palpation over the medial aspect of the tibial component. Range of motion is 10 to 120 degrees. In full extension, there is no laxity or instability. She does have some laxity in flexion and mid flexion, however she guards during exam. Painless logrolling of the hip  Neurovascularly intact distally.  Skin:    General: Skin is warm and dry.     Capillary Refill: Capillary refill takes less than 2 seconds.  Neurological:     General: No focal deficit present.     Mental Status: She is alert and oriented to person, place, and time.  Psychiatric:        Mood and Affect: Mood normal.        Behavior: Behavior normal.        Thought Content: Thought content normal.        Judgment: Judgment normal.     Vital signs in last 24 hours: @VSRANGES @  Labs:  Estimated body mass index is 23.54 kg/m as calculated from the following:   Height as of an earlier encounter on 03/22/23: 5' 3.5" (1.613 m).   Weight as of an earlier encounter on 03/22/23: 61.2 kg.  Imaging Review Plain radiographs demonstrate severe degenerative joint disease of the right knee(s). The overall alignment is neutral. She has flexion instability right knee. The bone quality appears to be adequate for age and reported activity level..    Assessment/Plan:  End stage arthritis, right knee(s) with failed previous arthroplasty.   The patient history,  physical examination, clinical judgment of the provider and imaging studies are consistent with end stage degenerative joint disease of the right knee(s), previous total knee arthroplasty. Revision total knee arthroplasty is deemed medically necessary. The treatment options including medical management, injection therapy, arthroscopy and revision arthroplasty were discussed at length. The risks and benefits of revision total knee arthroplasty were presented  and reviewed. The risks due to aseptic loosening, infection, stiffness, patella tracking problems, thromboembolic complications and other imponderables were discussed. The patient acknowledged the explanation, agreed to proceed with the plan and consent was signed. Patient is being admitted for inpatient treatment for surgery, pain control, PT, OT, prophylactic antibiotics, VTE prophylaxis, progressive ambulation and ADL's and discharge planning.The patient is planning to be discharged home with OPPT after an overnight stay.   Therapy Plans: outpatient therapy. PT at [facility] 03/26/23.  Disposition: Home with husband. Planned DVT Prophylaxis: Eliquis 5mg  BID DME needed: Has rolling walker. Ice machine today.  PCP: Cleared.  Cardiology: Cleared.  Hematology/Oncology: Stop Eliquis 3 days prior to surgery. Resume 1 day PO.  TXA: IV Allergies:  - Cyclosporine - itching - Latex - itching.  - Hydrocodone - N/V. - Hydromorphone - N/V can take with zofran. - Pipercillin- Tazobactam - itching.  - Lidocaine - anaphylaxis.  Anesthesia Concerns: Severe N/V with anesthesia. Lidocaine allergy. General last time due to lidocaine allergy.  BMI: 25.5 Last HgbA1c: 7.3 Other: - Antiphospholipid syndrome on Eliquis, DVT history. Stop 3 days prior to surgery.  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  - IVC filter placed 03/05/23, Dr. Randie Heinz.  - Type 2 diabetes on insulin. - TIA history eye and portal vein.  - Polyarthralgia. - Heart murmur.  -  Oxycodone vs dilaudid, zofran.  - 03/12/23: Hgb 13.9, K+ 3.8, Cr. 0.75. - Mupirocin.

## 2023-03-22 NOTE — Op Note (Signed)
OPERATIVE REPORT   03/22/2023  2:06 PM  PATIENT:  Laurie Green   SURGEON:  Jonette Pesa, MD  ASSISTANT:  Clint Bolder, PA-C.   PREOPERATIVE DIAGNOSIS:  Failed right total knee arthroplasty.  POSTOPERATIVE DIAGNOSIS:  Same.  PROCEDURE:  TOTAL KNEE REVISION  ANESTHESIA:   GETA.  ANTIBIOTICS:  2g Ancef.  EXPLANTS: DePuy Attune system with size 5 PS femur, 4 RP tibia, and 5 mm PS RP poly.   MPLANTS: Persona revision femur size 7+ with 5 mm distal augment x2, and 16 x 135 mm straight splined stem. Persona revision fixed-bearing tibia size D with 15 x 135 mm splined stem, 6 mm offset. Trabecular metal tibial cone, size medium. Trabecular metal femoral cone, size small. Vivacit-E 12 mm CPS fixed-bearing poly liner. Refobacin R bone cement.   SPECIMENS:  None.   COMPLICATIONS:  None.   DISPOSITION:  Stable to PACU.  SURGICAL INDICATIONS:  Laurie Green is a 77 y.o. female who underwent primary right total knee arthroplasty by Dr. Shelle Iron on 07/21/2020.  I saw her in the office for right knee pain and instability.  On exam, she was found to have flexion instability.  Her x-rays were normal.  CT scan revealed mild internal rotation of her femoral and tibial components.  Triple phase bone scan did not demonstrate any obvious loosening.  Her sed rate was normal at 6 mm/h, and her C-reactive protein was normal at 9.9 mg/L.  Her knee was aspirated on 05/24/2022, yielding synovial WBC 923 with 44% neutrophils; alpha defensin negative; microbial ID panel negative; Gram stain negative; and aerobic/anaerobic culture final negative.  Due to persistent symptomatic flexion instability, she was indicated for revision total knee arthroplasty.  We discussed the risks, benefits, and alternatives, and she elected to proceed.  The risks, benefits, and alternatives were discussed with the patient. There are risks associated with the surgery including, but not limited to, problems with anesthesia  (death), infection, instability (giving out of the joint), dislocation, differences in leg length/angulation/rotation, fracture of bones, loosening or failure of implants, hematoma (blood accumulation) which may require surgical drainage, blood clots, pulmonary embolism, nerve injury (foot drop and lateral thigh numbness), and blood vessel injury. The patient understands these risks and elects to proceed.  PROCEDURE IN DETAIL:  The patient was correctly identified in the holding area using 2 identifiers.  The surgical site was marked by myself.  She was taken to the operating room, and placed supine on the operating room table.  General anesthesia was obtained.  Foley catheter was placed.  A bump was placed under the right hip.  All bony prominences were well-padded.  A tourniquet was applied to the right thigh but not utilized.  The right lower extremity was prepped and draped in the normal sterile surgical fashion.  Timeout was called, verifying site and site of surgery.   Using a #10 blade, I sharply excised her previous surgical incision.  Full-thickness skin flaps were created.  I created a standard medial parapatellar arthrotomy.  A medial release was performed.  The knee was brought into extension.  Using Bovie electrocautery, I sharply excised the infrapatellar scar.  I then performed a radical synovectomy of the medial gutter, lateral gutter, and suprapatellar pouch.  There was no purulent material or evidence of infection.  I inspected the femoral, tibial, and patellar components.  All of the components were well-fixed.  I then assessed the knee throughout the range of motion.  In mid flexion and 90  degrees of flexion, there was gross instability.   The knee was flexed.  Using an ACL saw, I disrupted the implant cement interface of the femoral and tibial components.  Using the VersaDrive pneumatic osteotome, I further disrupted the implant cement interface as above.  Using an osteotome, I amputated  the RP poly peg, and I removed the poly liner.  Using the [facility] extractor, I was able to easily remove the femoral component without any significant bone loss.  I then removed the tibial component using the Shukla extractor without any significant bone loss.  Using an entry drill, I gained access to the intramedullary canals of the distal femur and proximal tibia.   I then sequentially reamed up to a 15 mm reamer for the tibia.  Using an intramedullary guide, I performed a cleanup cut, taking minimal bone.  The tibia was sized to an D, the appropriate rotation was set, and I maximized bony coverage by using 6 mm of offset.  The proximal tibia was prepared for a size medium cone.  The trial cone was placed.  The trial tibial component was assembled on the back table and placed into the tibia.   I then sequentially reamed up to an 16 mm reamer for the femur.  Distal cleanup cut was made using an intramedullary cutting guide.  I then sized the femur to a size 7, and the rotation was set while tensioning the flexion gap.  The 4-in-1 cutting guide was pinned into place, and the distal cuts were made.  The trial femoral component was assembled on the back table and inserted into the femur.  The box cut was made, and the trial box was inserted.  Trial poly liner was placed and I assessed the knee.  The extension gap was a few millimeters looser than the flexion gap, so I added 5 mm distal augments medially and laterally.  Trials were reinserted.  The knee was stable to varus and valgus stress throughout the range of motion.  Flexion and extension gaps were well balanced.  The trial components were removed, and the cut bony surfaces were irrigated with pulsatile lavage and dried with lap sponges. I prepared the femur for a size small central cone.   The real components were opened and assembled on the back table.  The tibial and femoral cones were press-fit into place.  Cement was mixed, and the tibial and femoral  components were inserted using hybrid fixation technique.  The trial poly liner was inserted and the knee was brought into extension while the cement polymerized.  All excess cement was cleared.  The trial poly liner was exchanged for the real poly liner.  Stability testing was repeated and found to be unchanged.  I assessed patellar tracking, which tracked centrally using the no thumbs technique.   The knee was then irrigated with Prontosan solution followed by normal saline using pulse lavage.  Meticulous hemostasis was achieved with the Aquamantys and Bovie electrocautery.  The arthrotomy was closed with #1 Vicryl and #1 strata fix.  Subcutaneous fatty tissue was reapproximated with 2-0 Vicryl suture.  Deep dermal layer was closed with 2-0 Monocryl suture.  Skin was reapproximated with staples.  Dermabond was applied to the incision.  Once the glue was fully hardened, an Aquacel Ag dressing was placed.  Bulky compressive dressing was placed.  The patient was then awakened from anesthesia and taken to the PACU in stable condition.  Sponge, needle, and instrument counts were correct at the end  of the case x 2.  There were no known complications.

## 2023-03-22 NOTE — Discharge Instructions (Signed)
 Dr. Eyden Dobie Total Joint Specialist [facility] [address] [place], [place] [address] [phone]  TOTAL KNEE REPLACEMENT POSTOPERATIVE DIRECTIONS    Knee Rehabilitation, Guidelines Following Surgery  Results after knee surgery are often greatly improved when you follow the exercise, range of motion and muscle strengthening exercises prescribed by your doctor. Safety measures are also important to protect the knee from further injury. Any time any of these exercises cause you to have increased pain or swelling in your knee joint, decrease the amount until you are comfortable again and slowly increase them. If you have problems or questions, call your caregiver or physical therapist for advice.   WEIGHT BEARING Weight bearing as tolerated with assist device (walker, cane, etc) as directed, use it as long as suggested by your surgeon or therapist, typically at least 4-6 weeks.  HOME CARE INSTRUCTIONS  Remove items at home which could result in a fall. This includes throw rugs or furniture in walking pathways.  Continue medications as instructed at time of discharge. You may have some home medications which will be placed on hold until you complete the course of blood thinner medication.  You may start showering once you are discharged home but do not submerge the incision under water. Just pat the incision dry and apply a dry gauze dressing on daily. Walk with walker as instructed.  You may resume a sexual relationship in one month or when given the OK by your doctor.  Use walker as long as suggested by your caregivers. Avoid periods of inactivity such as sitting longer than an hour when not asleep. This helps prevent blood clots.  You may put full weight on your legs and walk as much as is comfortable.  You may return to work once you are cleared by your doctor.  Do not drive a car for 6 weeks or until released by you surgeon.  Do not drive while  taking narcotics.  Wear the elastic stockings for three weeks following surgery during the day but you may remove then at night. Make sure you keep all of your appointments after your operation with all of your doctors and caregivers. You should call the office at the above phone number and make an appointment for approximately two weeks after the date of your surgery. Do not remove your surgical dressing. The dressing is waterproof; you may take showers in 3 days, but do not take tub baths or submerge the dressing. Please pick up a stool softener and laxative for home use as long as you are requiring pain medications. ICE to the affected knee every three hours for 30 minutes at a time and then as needed for pain and swelling.  Continue to use ice on the knee for pain and swelling from surgery. You may notice swelling that will progress down to the foot and ankle.  This is normal after surgery.  Elevate the leg when you are not up walking on it.   It is important for you to complete the blood thinner medication as prescribed by your doctor. Continue to use the breathing machine which will help keep your temperature down.  It is common for your temperature to cycle up and down following surgery, especially at night when you are not up moving around and exerting yourself.  The breathing machine keeps your lungs expanded and your temperature down.  RANGE OF MOTION AND STRENGTHENING EXERCISES  Rehabilitation of the knee is important following a knee injury or an   operation. After just a few days of immobilization, the muscles of the thigh which control the knee become weakened and shrink (atrophy). Knee exercises are designed to build up the tone and strength of the thigh muscles and to improve knee motion. Often times heat used for twenty to thirty minutes before working out will loosen up your tissues and help with improving the range of motion but do not use heat for the first two weeks following surgery.  These exercises can be done on a training (exercise) mat, on the floor, on a table or on a bed. Use what ever works the best and is most comfortable for you Knee exercises include:  Leg Lifts - While your knee is still immobilized in a splint or cast, you can do straight leg raises. Lift the leg to 60 degrees, hold for 3 sec, and slowly lower the leg. Repeat 10-20 times 2-3 times daily. Perform this exercise against resistance later as your knee gets better.  Quad and Hamstring Sets - Tighten up the muscle on the front of the thigh (Quad) and hold for 5-10 sec. Repeat this 10-20 times hourly. Hamstring sets are done by pushing the foot backward against an object and holding for 5-10 sec. Repeat as with quad sets.  A rehabilitation program following serious knee injuries can speed recovery and prevent re-injury in the future due to weakened muscles. Contact your doctor or a physical therapist for more information on knee rehabilitation.   POST-OPERATIVE OPIOID TAPER INSTRUCTIONS: It is important to wean off of your opioid medication as soon as possible. If you do not need pain medication after your surgery it is ok to stop day one. Opioids include: Codeine, Hydrocodone(Norco, Vicodin), Oxycodone(Percocet, oxycontin) and hydromorphone amongst others.  Long term and even short term use of opiods can cause: Increased pain response Dependence Constipation Depression Respiratory depression And more.  Withdrawal symptoms can include Flu like symptoms Nausea, vomiting And more Techniques to manage these symptoms Hydrate well Eat regular healthy meals Stay active Use relaxation techniques(deep breathing, meditating, yoga) Do Not substitute Alcohol to help with tapering If you have been on opioids for less than two weeks and do not have pain than it is ok to stop all together.  Plan to wean off of opioids This plan should start within one week post op of your joint replacement. Maintain the same  interval or time between taking each dose and first decrease the dose.  Cut the total daily intake of opioids by one tablet each day Next start to increase the time between doses. The last dose that should be eliminated is the evening dose.    SKILLED REHAB INSTRUCTIONS: If the patient is transferred to a skilled rehab facility following release from the hospital, a list of the current medications will be sent to the facility for the patient to continue.  When discharged from the skilled rehab facility, please have the facility set up the patient's Home Health Physical Therapy prior to being released. Also, the skilled facility will be responsible for providing the patient with their medications at time of release from the facility to include their pain medication, the muscle relaxants, and their blood thinner medication. If the patient is still at the rehab facility at time of the two week follow up appointment, the skilled rehab facility will also need to assist the patient in arranging follow up appointment in our office and any transportation needs.  MAKE SURE YOU:  Understand these instructions.  Will watch   your condition.  Will get help right away if you are not doing well or get worse.    Pick up stool softner and laxative for home use following surgery while on pain medications. Do NOT remove your dressing. You may shower.  Do not take tub baths or submerge incision under water. May shower starting three days after surgery. Please use a clean towel to pat the incision dry following showers. Continue to use ice for pain and swelling after surgery. Do not use any lotions or creams on the incision until instructed by your surgeon.  

## 2023-03-23 ENCOUNTER — Encounter (HOSPITAL_COMMUNITY): Payer: Self-pay | Admitting: Orthopedic Surgery

## 2023-03-23 LAB — BASIC METABOLIC PANEL
Anion gap: 9 (ref 5–15)
BUN: 28 mg/dL — ABNORMAL HIGH (ref 8–23)
CO2: 24 mmol/L (ref 22–32)
Calcium: 7.9 mg/dL — ABNORMAL LOW (ref 8.9–10.3)
Chloride: 100 mmol/L (ref 98–111)
Creatinine, Ser: 0.93 mg/dL (ref 0.44–1.00)
GFR, Estimated: >60 mL/min (ref >60)
Glucose, Bld: 267 mg/dL — ABNORMAL HIGH (ref 70–99)
Potassium: 4.3 mmol/L (ref 3.5–5.1)
Sodium: 133 mmol/L — ABNORMAL LOW (ref 135–145)

## 2023-03-23 LAB — CBC
HCT: 30.7 % — ABNORMAL LOW (ref 36.0–46.0)
Hemoglobin: 10.2 g/dL — ABNORMAL LOW (ref 12.0–15.0)
MCH: 34 pg (ref 26.0–34.0)
MCHC: 33.2 g/dL (ref 30.0–36.0)
MCV: 102.3 fL — ABNORMAL HIGH (ref 80.0–100.0)
Platelets: 210 10*3/uL (ref 150–400)
RBC: 3 MIL/uL — ABNORMAL LOW (ref 3.87–5.11)
RDW: 12.9 % (ref 11.5–15.5)
WBC: 11 10*3/uL — ABNORMAL HIGH (ref 4.0–10.5)
nRBC: 0 % (ref 0.0–0.2)

## 2023-03-23 LAB — GLUCOSE, CAPILLARY
Glucose-Capillary: 239 mg/dL — ABNORMAL HIGH (ref 70–99)
Glucose-Capillary: 198 mg/dL — ABNORMAL HIGH (ref 70–99)

## 2023-03-23 MED ORDER — MUPIROCIN 2 % EX OINT: TOPICAL_OINTMENT | Freq: Two times a day (BID) | CUTANEOUS | 0 refills | Status: AC

## 2023-03-23 MED ORDER — OXYCODONE HCL 5 MG PO TABS: 5.0000 mg | ORAL_TABLET | ORAL | 0 refills | Status: AC | PRN

## 2023-03-23 MED ORDER — METHOCARBAMOL 500 MG PO TABS: 500.0000 mg | ORAL_TABLET | Freq: Four times a day (QID) | ORAL | 0 refills | Status: DC | PRN

## 2023-03-23 MED ORDER — ONDANSETRON HCL 4 MG PO TABS: 4.0000 mg | ORAL_TABLET | Freq: Three times a day (TID) | ORAL | 0 refills | Status: AC | PRN

## 2023-03-23 MED ORDER — POLYETHYLENE GLYCOL 3350 17 G PO PACK: 17.0000 g | PACK | Freq: Every day | ORAL | 0 refills | Status: AC | PRN

## 2023-03-23 MED ORDER — SENNA 8.6 MG PO TABS: 2.0000 | ORAL_TABLET | Freq: Every day | ORAL | 0 refills | Status: AC

## 2023-03-23 MED ORDER — DOCUSATE SODIUM 100 MG PO CAPS: 100.0000 mg | ORAL_CAPSULE | Freq: Two times a day (BID) | ORAL | 0 refills | Status: AC

## 2023-03-23 NOTE — Progress Notes (Signed)
Physical Therapy Treatment Patient Details Name: Laurie Green MRN: [id number] DOB: 07/26/1945 Today's Date: 03/23/2023   History of Present Illness Pt s/p L TKR revision and with hx of L TKR (2022), DM, TIA, and Fibromyalgia    PT Comments  Pt very cooperative and progressing steadily with mobility.  Pt up to ambulate in hall, negotiated stairs, up to bathroom for toileting and performed HEP with written instruction provided and reviewed.  Pt eager for dc home this date.    If plan is discharge home, recommend the following: A little help with walking and/or transfers;A little help with bathing/dressing/bathroom;Assistance with cooking/housework;Assist for transportation;Help with stairs or ramp for entrance   Can travel by private vehicle        Equipment Recommendations  None recommended by PT    Recommendations for Other Services       Precautions / Restrictions Precautions Precautions: Knee;Fall Restrictions Weight Bearing Restrictions: No Other Position/Activity Restrictions: WBAT     Mobility  Bed Mobility Overal bed mobility: Needs Assistance Bed Mobility: Supine to Sit, Sit to Supine     Supine to sit: Supervision Sit to supine: Contact guard assist   General bed mobility comments: Cues for sequence and use of L LE to self assist    Transfers Overall transfer level: Needs assistance Equipment used: Rolling walker (2 wheels) Transfers: Sit to/from Stand Sit to Stand: Contact guard assist, Supervision           General transfer comment: cues for LE management and use of UEs to self assist    Ambulation/Gait Ambulation/Gait assistance: Contact guard assist Gait Distance (Feet): 70 Feet (and 15' from bathroom) Assistive device: Rolling walker (2 wheels) Gait Pattern/deviations: Step-to pattern, Decreased step length - right, Decreased step length - left, Shuffle, Trunk flexed Gait velocity: decr     General Gait Details: cues for sequence, posture  and position from RW; distance ltd by nausea   Stairs Stairs: Yes Stairs assistance: Min assist Stair Management: No rails, Step to pattern, Backwards, With walker Number of Stairs: 4 General stair comments: 2 stairs twice with RW bkwd; cues for sequence   Wheelchair Mobility     Tilt Bed    Modified Rankin (Stroke Patients Only)       Balance Overall balance assessment: Mild deficits observed, not formally tested                                          Cognition Arousal: Alert Behavior During Therapy: WFL for tasks assessed/performed Overall Cognitive Status: Within Functional Limits for tasks assessed                                          Exercises Total Joint Exercises Ankle Circles/Pumps: AROM, Both, 15 reps, Supine Quad Sets: AROM, Both, 10 reps, Supine Gluteal Sets: PROM Heel Slides: AAROM, Right, 10 reps, Supine Straight Leg Raises: AAROM, Right, 10 reps, Supine Long Arc Quad: AAROM, Right, 10 reps, Seated    General Comments        Pertinent Vitals/Pain Pain Assessment Pain Assessment: 0-10 Pain Score: 5  Pain Location: R knee Pain Descriptors / Indicators: Aching, Sore Pain Intervention(s): Limited activity within patient's tolerance, Premedicated before session, Monitored during session, Ice applied    Home Living Family/patient expects to  be discharged to:: Private residence Living Arrangements: Spouse/significant other Available Help at Discharge: Family;Available 24 hours/day Type of Home: House Home Access: Stairs to enter Entrance Stairs-Rails: None Entrance Stairs-Number of Steps: 3 Alternate Level Stairs-Number of Steps: flight` Home Layout: Two level;Able to live on main level with bedroom/bathroom Home Equipment: Rolling Walker (2 wheels);Cane - single point;Crutches      Prior Function            PT Goals (current goals can now be found in the care plan section) Acute Rehab PT  Goals Patient Stated Goal: Regain IND PT Goal Formulation: With patient Time For Goal Achievement: 03/30/23 Potential to Achieve Goals: Good Progress towards PT goals: Progressing toward goals    Frequency    7X/week      PT Plan      Co-evaluation              AM-PAC PT "6 Clicks" Mobility   Outcome Measure  Help needed turning from your back to your side while in a flat bed without using bedrails?: A Little Help needed moving from lying on your back to sitting on the side of a flat bed without using bedrails?: A Little Help needed moving to and from a bed to a chair (including a wheelchair)?: A Little Help needed standing up from a chair using your arms (e.g., wheelchair or bedside chair)?: A Little Help needed to walk in hospital room?: A Little Help needed climbing 3-5 steps with a railing? : A Little 6 Click Score: 18    End of Session Equipment Utilized During Treatment: Gait belt Activity Tolerance: Patient tolerated treatment well Patient left: in bed;with call bell/phone within reach;with family/visitor present Nurse Communication: Mobility status PT Visit Diagnosis: Difficulty in walking, not elsewhere classified (R26.2)     Time: 1450-1530 PT Time Calculation (min) (ACUTE ONLY): 40 min  Charges:    $Gait Training: 8-22 mins $Therapeutic Exercise: 8-22 mins PT General Charges $$ ACUTE PT VISIT: 1 Visit                     Mauro Kaufmann PT Acute Rehabilitation Services Pager [phone] Office [phone]    [name] 03/23/2023, 3:40 PM

## 2023-03-23 NOTE — Evaluation (Signed)
Physical Therapy Evaluation Patient Details Name: Laurie Green MRN: [id number] DOB: 1945/05/06 Today's Date: 03/23/2023  History of Present Illness  Pt s/p L TKR revision and with hx of L TKR (2022), DM, TIA, and Fibromyalgia  Clinical Impression  Pt s/p L TKR revision and presents with decreased L LE strength/ROM and post op pain and nausea limiting functional mobility.  Pt should progress to dc home with family assist and reports first OP PT scheduled for 03/26/23        If plan is discharge home, recommend the following: A little help with walking and/or transfers;A little help with bathing/dressing/bathroom;Assistance with cooking/housework;Assist for transportation;Help with stairs or ramp for entrance   Can travel by private vehicle        Equipment Recommendations None recommended by PT  Recommendations for Other Services       Functional Status Assessment Patient has had a recent decline in their functional status and demonstrates the ability to make significant improvements in function in a reasonable and predictable amount of time.     Precautions / Restrictions Precautions Precautions: Knee;Fall Restrictions Weight Bearing Restrictions: No Other Position/Activity Restrictions: WBAT      Mobility  Bed Mobility Overal bed mobility: Needs Assistance Bed Mobility: Supine to Sit     Supine to sit: Min assist     General bed mobility comments: Cues for sequence and use of L LE to self assist    Transfers Overall transfer level: Needs assistance Equipment used: Rolling walker (2 wheels) Transfers: Sit to/from Stand Sit to Stand: Min assist, Contact guard assist           General transfer comment: cues for LE management and use of UEs to self assist    Ambulation/Gait Ambulation/Gait assistance: Min assist, Contact guard assist Gait Distance (Feet): 52 Feet Assistive device: Rolling walker (2 wheels) Gait Pattern/deviations: Step-to pattern,  Decreased step length - right, Decreased step length - left, Shuffle, Trunk flexed Gait velocity: decr     General Gait Details: cues for sequence, posture and position from RW; distance ltd by nausea  Stairs            Wheelchair Mobility     Tilt Bed    Modified Rankin (Stroke Patients Only)       Balance Overall balance assessment: Mild deficits observed, not formally tested                                           Pertinent Vitals/Pain Pain Assessment Pain Assessment: 0-10 Pain Score: 5  Pain Location: R knee Pain Descriptors / Indicators: Aching, Sore Pain Intervention(s): Limited activity within patient's tolerance, Monitored during session, Premedicated before session, Ice applied    Home Living Family/patient expects to be discharged to:: Private residence Living Arrangements: Spouse/significant other Available Help at Discharge: Family;Available 24 hours/day Type of Home: House Home Access: Stairs to enter Entrance Stairs-Rails: None Entrance Stairs-Number of Steps: 3 Alternate Level Stairs-Number of Steps: flight` Home Layout: Two level;Able to live on main level with bedroom/bathroom Home Equipment: Rolling Walker (2 wheels);Cane - single point;Crutches      Prior Function Prior Level of Function : Independent/Modified Independent                     Extremity/Trunk Assessment   Upper Extremity Assessment Upper Extremity Assessment: Overall WFL for tasks assessed  Lower Extremity Assessment Lower Extremity Assessment: RLE deficits/detail RLE Deficits / Details: 2+/5 quads with AAROM at knee -5 - 45    Cervical / Trunk Assessment Cervical / Trunk Assessment: Normal  Communication   Communication Communication: No apparent difficulties  Cognition Arousal: Alert Behavior During Therapy: WFL for tasks assessed/performed Overall Cognitive Status: Within Functional Limits for tasks assessed                                           General Comments      Exercises Total Joint Exercises Ankle Circles/Pumps: AROM, Both, 15 reps, Supine Quad Sets: AROM, Both, 10 reps, Supine Heel Slides: AAROM, Right, 10 reps, Supine Straight Leg Raises: AAROM, Right, 10 reps, Supine   Assessment/Plan    PT Assessment Patient needs continued PT services  PT Problem List Decreased strength;Decreased range of motion;Decreased activity tolerance;Decreased balance;Decreased mobility;Decreased knowledge of use of DME;Pain       PT Treatment Interventions DME instruction;Gait training;Stair training;Functional mobility training;Therapeutic activities;Therapeutic exercise;Balance training;Patient/family education    PT Goals (Current goals can be found in the Care Plan section)  Acute Rehab PT Goals Patient Stated Goal: Regain IND PT Goal Formulation: With patient Time For Goal Achievement: 03/30/23 Potential to Achieve Goals: Good    Frequency 7X/week     Co-evaluation               AM-PAC PT "6 Clicks" Mobility  Outcome Measure Help needed turning from your back to your side while in a flat bed without using bedrails?: A Little Help needed moving from lying on your back to sitting on the side of a flat bed without using bedrails?: A Little Help needed moving to and from a bed to a chair (including a wheelchair)?: A Little Help needed standing up from a chair using your arms (e.g., wheelchair or bedside chair)?: A Little Help needed to walk in hospital room?: A Little Help needed climbing 3-5 steps with a railing? : A Lot 6 Click Score: 17    End of Session Equipment Utilized During Treatment: Gait belt Activity Tolerance: Other (comment) (nausea) Patient left: in chair;with call bell/phone within reach;with chair alarm set Nurse Communication: Mobility status PT Visit Diagnosis: Difficulty in walking, not elsewhere classified (R26.2)    Time: 4098-1191 PT Time Calculation  (min) (ACUTE ONLY): 33 min   Charges:   PT Evaluation $PT Eval Low Complexity: 1 Low PT Treatments $Therapeutic Exercise: 8-22 mins PT General Charges $$ ACUTE PT VISIT: 1 Visit         Mauro Kaufmann PT Acute Rehabilitation Services Pager [phone] Office [phone]   Laurie Green 03/23/2023, 1:16 PM

## 2023-03-23 NOTE — Plan of Care (Signed)
  Problem: Clinical Measurements: Goal: Ability to maintain clinical measurements within normal limits will improve 03/23/2023 0434 by Ashok Croon, RN Outcome: Progressing 03/23/2023 0434 by Ashok Croon, RN Outcome: Progressing   Problem: Health Behavior/Discharge Planning: Goal: Ability to manage health-related needs will improve 03/23/2023 0434 by Ashok Croon, RN Outcome: Progressing 03/23/2023 0434 by Ashok Croon, RN Outcome: Progressing   Problem: Clinical Measurements: Goal: Will remain free from infection 03/23/2023 0434 by Ashok Croon, RN Outcome: Progressing 03/23/2023 0434 by Ashok Croon, RN Outcome: Progressing   Problem: Clinical Measurements: Goal: Ability to maintain clinical measurements within normal limits will improve 03/23/2023 0434 by Ashok Croon, RN Outcome: Progressing 03/23/2023 0434 by Ashok Croon, RN Outcome: Progressing

## 2023-03-23 NOTE — TOC Transition Note (Signed)
Transition of Care [facility]) - CM/SW Discharge Note   Patient Details  Name: Laurie Green MRN: [id number] Date of Birth: [birth year]  [facility]) CM/SW Contact:  Amada Jupiter, LCSW Phone Number: 03/23/2023, 9:52 AM   Clinical Narrative:     Met with pt who confirms she has needed DME in the home.  OPPT already arranged with [facility].  No further TOC needs.  Final next level of care: OP Rehab Barriers to Discharge: No Barriers Identified   Patient Goals and CMS Choice      Discharge Placement                         Discharge Plan and Services Additional resources added to the After Visit Summary for                  DME Arranged: N/A DME Agency: NA                  Social Determinants of Health (SDOH) Interventions SDOH Screenings   Food Insecurity: No Food Insecurity (03/22/2023)  Housing: Low Risk  (03/22/2023)  Transportation Needs: No Transportation Needs (03/22/2023)  Utilities: Not At Risk (03/22/2023)  Social Connections: Unknown (08/24/2021)   Received [facility]  Tobacco Use: Low Risk  (03/22/2023)     Readmission Risk Interventions    03/23/2023    9:51 AM  Readmission Risk Prevention Plan  Post Dischage Appt Complete  Medication Screening Complete  Transportation Screening Complete

## 2023-03-23 NOTE — Inpatient Diabetes Management (Signed)
Inpatient Diabetes Program Recommendations  AACE/ADA: New Consensus Statement on Inpatient Glycemic Control   Target Ranges:  Prepandial:   less than 140 mg/dL      Peak postprandial:   less than 180 mg/dL (1-2 hours)      Critically ill patients:  140 - 180 mg/dL    Latest Reference Range & Units 03/22/23 08:28 03/22/23 11:34 03/22/23 13:30 03/22/23 14:19 03/22/23 21:42 03/23/23 07:29  Glucose-Capillary 70 - 99 mg/dL 865 (H) 784 (H) 696 (H) 154 (H) 241 (H) 239 (H)    Review of Glycemic Control  Diabetes history: DM2 Outpatient Diabetes medications: Lantus 12 units BID Current orders for Inpatient glycemic control: Novolog 0-9 units TID with meals, Novolog 0-5 units QHS  Inpatient Diabetes Program Recommendations:    Insulin: Please consider ordering Semglee 10 units daily.  Thanks, Orlando Penner, RN, MSN, CDCES Diabetes Coordinator Inpatient Diabetes Program [phone] (Team Pager from 8am to 5pm)

## 2023-03-23 NOTE — Progress Notes (Signed)
    Subjective:  Patient reports pain as mild to moderate.  Denies N/V/CP/SOB/Abd pain. She denies any tingling or numbness in LE bilaterally. She reports her pain is doing good on current regimen. She is eager for d/c home.  Catheter removed this morning.   Objective:   VITALS:   Vitals:   03/22/23 2144 03/23/23 0114 03/23/23 0529 03/23/23 0915  BP: 117/68 107/60 (!) 117/57 108/65  Pulse: 69 73 71 73  Resp: 16 17 17 17   Temp: (!) 97.4 F (36.3 C) 98 F (36.7 C) 98.3 F (36.8 C) 98.2 F (36.8 C)  TempSrc: Oral Oral Oral   SpO2: 95% 96% 92% 94%  Weight:      Height:        NAD Neurologically intact ABD soft Neurovascular intact Sensation intact distally Intact pulses distally Dorsiflexion/Plantar flexion intact Incision: dressing C/D/I No cellulitis present Compartment soft   Lab Results  Component Value Date   WBC 11.0 (H) 03/23/2023   HGB 10.2 (L) 03/23/2023   HCT 30.7 (L) 03/23/2023   MCV 102.3 (H) 03/23/2023   PLT 210 03/23/2023   BMET    Component Value Date/Time   NA 133 (L) 03/23/2023 0328   NA 144 04/04/2017 1139   NA 140 09/07/2016 0946   K 4.3 03/23/2023 0328   K 3.5 04/04/2017 1139   K 4.1 09/07/2016 0946   CL 100 03/23/2023 0328   CL 103 04/04/2017 1139   CO2 24 03/23/2023 0328   CO2 33 04/04/2017 1139   CO2 28 09/07/2016 0946   GLUCOSE 267 (H) 03/23/2023 0328   GLUCOSE 107 04/04/2017 1139   BUN 28 (H) 03/23/2023 0328   BUN 15 04/04/2017 1139   BUN 22.6 09/07/2016 0946   CREATININE 0.93 03/23/2023 0328   CREATININE 0.86 09/28/2022 1253   CREATININE 0.9 04/04/2017 1139   CREATININE 0.8 09/07/2016 0946   CALCIUM 7.9 (L) 03/23/2023 0328   CALCIUM 9.5 04/04/2017 1139   CALCIUM 9.6 09/07/2016 0946   EGFR 72 (L) 09/07/2016 0946   GFRNONAA >60 03/23/2023 0328   GFRNONAA >60 09/28/2022 1253     Assessment/Plan: 1 Day Post-Op   Principal Problem:   S/P revision of total knee, right  ABLA. Hemoglobin 10.2. Asymptomatic currently.  Continue to monitor.   WBAT with walker DVT ppx:  Eliquis , SCDs, TEDS PO pain control PT/OT: PT has not seen yet. PT to come by today.  Dispo:  - D/c home once cleared with PT and voided.    Clois Dupes, PA-C 03/23/2023, 11:10 AM   [facility]  [place] [address], [place], [place] [address] Phone: [phone] [email or web address] Facebook  [employer]

## 2023-03-26 DIAGNOSIS — M25661 Stiffness of right knee, not elsewhere classified: Secondary | ICD-10-CM | POA: Diagnosis not present

## 2023-03-26 DIAGNOSIS — M25561 Pain in right knee: Secondary | ICD-10-CM | POA: Diagnosis not present

## 2023-03-26 DIAGNOSIS — M6281 Muscle weakness (generalized): Secondary | ICD-10-CM | POA: Diagnosis not present

## 2023-03-26 NOTE — Discharge Summary (Signed)
Physician Discharge Summary  Patient ID: Laurie Green MRN: [id number] DOB/AGE: 75-Dec-1947 75 y.o.  Admit date: 03/22/2023 Discharge date: 03/23/2023  Admission Diagnoses:  S/P revision of total knee, right  Discharge Diagnoses:  Principal Problem:   S/P revision of total knee, right   Past Medical History:  Diagnosis Date   Anginal pain (HCC)    Anti-cardiolipin antibody syndrome (HCC)    antiphospholipid antibody syndrome   Antiphospholipid antibody with hypercoagulable state (HCC) 11/07/2012   Anxiety    Arthritis    Blood dyscrasia    anticardiolipid syndrome   Blood transfusion    age 83 yr old-none since   Celiac disease    Diabetes mellitus    Type II   Diverticulitis    DVT (deep venous thrombosis) (HCC) 05/01/2011   "to liver"   Family history of adverse reaction to anesthesia    N&V   Fibromyalgia    GERD (gastroesophageal reflux disease)    11/01/16- not problem   H/O seasonal allergies    Heart murmur    "not a problem"   Hypertension    Nausea & vomiting    11-17-14 "nausea and vomiting after meals" at present discussed with Dr. Randa Evens office per pt.   Pneumonia 2021   PONV (postoperative nausea and vomiting)    'I dont have it if they give me something in the IV."   Portal hypertension (HCC)    Stroke (HCC)    tia- eye,   TIA (transient ischemic attack)    Varices 05/01/2011   Varices, esophageal (HCC)    Varices, gastric     Surgeries: Procedure(s): TOTAL KNEE REVISION on 03/22/2023   Consultants (if any):   Discharged Condition: Improved  Hospital Course: Laurie Green is an 77 y.o. female who was admitted 03/22/2023 with a diagnosis of S/P revision of total knee, right and went to the operating room on 03/22/2023 and underwent the above named procedures.    She was given perioperative antibiotics:  Anti-infectives (From admission, onward)    Start     Dose/Rate Route Frequency Ordered Stop   03/22/23 1745  ceFAZolin (ANCEF) IVPB  2g/100 mL premix        2 g 200 mL/hr over 30 Minutes Intravenous Every 6 hours 03/22/23 1646 03/23/23 1234   03/22/23 0900  ceFAZolin (ANCEF) IVPB 2g/100 mL premix        2 g 200 mL/hr over 30 Minutes Intravenous On call to O.R. 03/22/23 0845 03/22/23 1119       She was given sequential compression devices, early ambulation, and Eliquis for DVT prophylaxis.  POD#1 Patient pain doing well. ABLA hemoglobin 10.2. Asymptomatic. She ambulated 52 and 70 feet with PT. D/c home with OPPT.   She benefited maximally from the hospital stay and there were no complications.    Recent vital signs:  Vitals:   03/23/23 0529 03/23/23 0915  BP: (!) 117/57 108/65  Pulse: 71 73  Resp: 17 17  Temp: 98.3 F (36.8 C) 98.2 F (36.8 C)  SpO2: 92% 94%    Recent laboratory studies:  Lab Results  Component Value Date   HGB 10.2 (L) 03/23/2023   HGB 13.9 03/12/2023   HGB 14.4 02/07/2023   Lab Results  Component Value Date   WBC 11.0 (H) 03/23/2023   PLT 210 03/23/2023   Lab Results  Component Value Date   INR 1.5 (H) 07/16/2020   Lab Results  Component Value Date   NA 133 (L) 03/23/2023  K 4.3 03/23/2023   CL 100 03/23/2023   CO2 24 03/23/2023   BUN 28 (H) 03/23/2023   CREATININE 0.93 03/23/2023   GLUCOSE 267 (H) 03/23/2023     Allergies as of 03/23/2023       Reactions   Lidocaine Hcl Anaphylaxis   Piperacillin Sod-tazobactam So Itching   Cyclosporine Other (See Comments)   burns   Hydromorphone Hcl Nausea And Vomiting   Can take with zofran   Morphine And Codeine Nausea And Vomiting   Percocet [oxycodone-acetaminophen] Nausea And Vomiting   Tape Itching        Medication List     TAKE these medications    acetaminophen 500 MG tablet Commonly known as: TYLENOL Take 1,000 mg by mouth every 8 (eight) hours as needed for moderate pain (pain score 4-6).   benazepril 40 MG tablet Commonly known as: LOTENSIN Take 40 mg by mouth daily.   Blood Sugar Balance  Tabs Take 1 tablet by mouth daily as needed (high blood sugar). Glucofort   docusate sodium 100 MG capsule Commonly known as: Colace Take 1 capsule (100 mg total) by mouth 2 (two) times daily.   Eliquis 5 MG Tabs tablet Generic drug: apixaban TAKE 1 TABLET BY MOUTH TWICE A DAY   Lantus SoloStar 100 UNIT/ML Solostar Pen Generic drug: insulin glargine Inject 24 Units into the skin at bedtime. What changed:  how much to take when to take this   MAGNESIUM PO Take 2 tablets by mouth daily.   methocarbamol 500 MG tablet Commonly known as: ROBAXIN Take 1 tablet (500 mg total) by mouth every 6 (six) hours as needed.   mupirocin ointment 2 % Commonly known as: BACTROBAN Place into the nose 2 (two) times daily for 5 days.   neomycin-bacitracin-polymyxin Oint Commonly known as: NEOSPORIN Apply 1 Application topically as needed (dry nose).   ondansetron 4 MG tablet Commonly known as: Zofran Take 1 tablet (4 mg total) by mouth every 8 (eight) hours as needed for nausea or vomiting.   OVER THE COUNTER MEDICATION Take 2 tablets by mouth daily. Beef liver supplement   OVER THE COUNTER MEDICATION Take 1 tablet by mouth daily. Serrapeptase supplement   OVER THE COUNTER MEDICATION Take 5 mLs by mouth daily. C 60 supplement   oxyCODONE 5 MG immediate release tablet Commonly known as: Roxicodone Take 1-2 tablets (5-10 mg total) by mouth every 4 (four) hours as needed for severe pain (pain score 7-10).   polyethylene glycol 17 g packet Commonly known as: MiraLax Take 17 g by mouth daily as needed for mild constipation or moderate constipation.   propranolol ER 80 MG 24 hr capsule Commonly known as: INDERAL LA Take 1 capsule (80 mg total) by mouth in the morning.   propranolol 10 MG tablet Commonly known as: INDERAL TAKE 1 TABLET (10 MG TOTAL) BY MOUTH 4 (FOUR) TIMES DAILY. AS NEEDED   senna 8.6 MG Tabs tablet Commonly known as: SENOKOT Take 2 tablets (17.2 mg total) by mouth  at bedtime for 15 days.   VITAMIN E PO Take 1 capsule by mouth daily.               Discharge Care Instructions  (From admission, onward)           Start     Ordered   03/23/23 0000  Weight bearing as tolerated        03/23/23 1227   03/23/23 0000  Change dressing  Comments: Do not remove your dressing.   03/23/23 1227              WEIGHT BEARING   Weight bearing as tolerated with assist device (walker, cane, etc) as directed, use it as long as suggested by your surgeon or therapist, typically at least 4-6 weeks.   EXERCISES  Results after joint replacement surgery are often greatly improved when you follow the exercise, range of motion and muscle strengthening exercises prescribed by your doctor. Safety measures are also important to protect the joint from further injury. Any time any of these exercises cause you to have increased pain or swelling, decrease what you are doing until you are comfortable again and then slowly increase them. If you have problems or questions, call your caregiver or physical therapist for advice.   Rehabilitation is important following a joint replacement. After just a few days of immobilization, the muscles of the leg can become weakened and shrink (atrophy).  These exercises are designed to build up the tone and strength of the thigh and leg muscles and to improve motion. Often times heat used for twenty to thirty minutes before working out will loosen up your tissues and help with improving the range of motion but do not use heat for the first two weeks following surgery (sometimes heat can increase post-operative swelling).   These exercises can be done on a training (exercise) mat, on the floor, on a table or on a bed. Use whatever works the best and is most comfortable for you.    Use music or television while you are exercising so that the exercises are a pleasant break in your day. This will make your life better with the  exercises acting as a break in your routine that you can look forward to.   Perform all exercises about fifteen times, three times per day or as directed.  You should exercise both the operative leg and the other leg as well.  Exercises include:   Quad Sets - Tighten up the muscle on the front of the thigh (Quad) and hold for 5-10 seconds.   Straight Leg Raises - With your knee straight (if you were given a brace, keep it on), lift the leg to 60 degrees, hold for 3 seconds, and slowly lower the leg.  Perform this exercise against resistance later as your leg gets stronger.  Leg Slides: Lying on your back, slowly slide your foot toward your buttocks, bending your knee up off the floor (only go as far as is comfortable). Then slowly slide your foot back down until your leg is flat on the floor again.  Angel Wings: Lying on your back spread your legs to the side as far apart as you can without causing discomfort.  Hamstring Strength:  Lying on your back, push your heel against the floor with your leg straight by tightening up the muscles of your buttocks.  Repeat, but this time bend your knee to a comfortable angle, and push your heel against the floor.  You may put a pillow under the heel to make it more comfortable if necessary.   A rehabilitation program following joint replacement surgery can speed recovery and prevent re-injury in the future due to weakened muscles. Contact your doctor or a physical therapist for more information on knee rehabilitation.    CONSTIPATION  Constipation is defined medically as fewer than three stools per week and severe constipation as less than one stool per week.  Even if you  have a regular bowel pattern at home, your normal regimen is likely to be disrupted due to multiple reasons following surgery.  Combination of anesthesia, postoperative narcotics, change in appetite and fluid intake all can affect your bowels.   YOU MUST use at least one of the following  options; they are listed in order of increasing strength to get the job done.  They are all available over the counter, and you may need to use some, POSSIBLY even all of these options:    Drink plenty of fluids (prune juice may be helpful) and high fiber foods Colace 100 mg by mouth twice a day  Senokot for constipation as directed and as needed Dulcolax (bisacodyl), take with full glass of water  Miralax (polyethylene glycol) once or twice a day as needed.  If you have tried all these things and are unable to have a bowel movement in the first 3-4 days after surgery call either your surgeon or your primary doctor.    If you experience loose stools or diarrhea, hold the medications until you stool forms back up.  If your symptoms do not get better within 1 week or if they get worse, check with your doctor.  If you experience "the worst abdominal pain ever" or develop nausea or vomiting, please contact the office immediately for further recommendations for treatment.   ITCHING:  If you experience itching with your medications, try taking only a single pain pill, or even half a pain pill at a time.  You can also use Benadryl over the counter for itching or also to help with sleep.   TED HOSE STOCKINGS:  Use stockings on both legs until for at least 2 weeks or as directed by physician office. They may be removed at night for sleeping.  MEDICATIONS:  See your medication summary on the "After Visit Summary" that nursing will review with you.  You may have some home medications which will be placed on hold until you complete the course of blood thinner medication.  It is important for you to complete the blood thinner medication as prescribed.  PRECAUTIONS:  If you experience chest pain or shortness of breath - call 911 immediately for transfer to the hospital emergency department.   If you develop a fever greater that 101 F, purulent drainage from wound, increased redness or drainage from wound, foul  odor from the wound/dressing, or calf pain - CONTACT YOUR SURGEON.                                                   FOLLOW-UP APPOINTMENTS:  If you do not already have a post-op appointment, please call the office for an appointment to be seen by your surgeon.  Guidelines for how soon to be seen are listed in your "After Visit Summary", but are typically between 1-4 weeks after surgery.  OTHER INSTRUCTIONS:   Knee Replacement:  Do not place pillow under knee, focus on keeping the knee straight while resting. CPM instructions: 0-90 degrees, 2 hours in the morning, 2 hours in the afternoon, and 2 hours in the evening. Place foam block, curve side up under heel at all times except when in CPM or when walking.  DO NOT modify, tear, cut, or change the foam block in any way.   MAKE SURE YOU:  Understand these instructions.  Get  help right away if you are not doing well or get worse.    Thank you for letting us be a part of your medical care team.  It is a privilege we respect greatly.  We hope these instructions will help you stay on track for a fast and full recovery!   Diagnostic Studies: DG Knee Right Port  Result Date: 03/22/2023 CLINICAL DATA:  Revision of right total knee prosthesis EXAM: PORTABLE RIGHT KNEE - 1-2 VIEW COMPARISON:  07/21/2020 and CT scan 08/03/2022 FINDINGS: Total knee prosthesis observed with long stem extensions of the tibial and femoral components. No periprosthetic fracture or acute complicating feature identified. Expected gas in the joint and soft tissues. Patellar resurfacing noted. IMPRESSION: 1. Total knee prosthesis without acute complicating feature identified. Electronically Signed   By: Gaylyn Rong M.D.   On: 03/22/2023 16:42   PERIPHERAL VASCULAR CATHETERIZATION  Result Date: 03/05/2023 Images from the original result were not included.   Patient name: Laurie Green        MRN: [id number]        DOB: [birth year]        Sex: female  03/05/2023  Pre-operative Diagnosis: History of antiphospholipid antibody syndrome, need for right total knee arthroplasty revision Post-operative diagnosis:  Same Surgeon:  Luanna Salk. Randie Heinz, MD Procedure Performed: 1.  Ultrasound-guided cannulation right common femoral vein 2.  IVC venogram 3.  Placement of infrarenal Cook Celect IVC filter 4.  Moderate sedation with fentanyl and Versed for 6 minutes  Indications: 77 year old female with a history of antiphospholipid antibody syndrome has had 2 previous filters in the past 1 many years ago at [facility] that was removed and another here that I removed last year after she recovered from total knee arthroplasty.  She is now indicated for total knee arthroplasty revision and she is indicated for filter replacement to prevent thrombotic issues.  Findings: The IVC measured less than 40 mm but was very short in the infrarenal position.  A filter was placed but is tilted towards the left renal vein similar to her previous filter.  Plan will be to follow-up with me in 3 to 4 months to discuss timing of filter removal.             Procedure:  The patient was identified in the holding area and taken to room 8.  The patient was then placed supine on the table and prepped and draped in the usual sterile fashion.  A time out was called.  Ultrasound was used to evaluate the right common femoral vein which was noted to be patent and compressible.  Moderate sedation with fentanyl and Versed was administered and her vital signs were monitored throughout the case.  Her right groin was anesthetized with tetracaine.  The common femoral vein was cannulated with a micropuncture needle followed by wire and sheath and a Bentson wire was placed and this was dilated and the filter introducer sheath was placed in the IVC venogram performed.  Filter was then placed in the infrarenal position.  Fortunately this did have a tilt towards the left renal vein but I think this is secondary to short infrarenal IVC.   Filter was left in place we will discuss removal in 3 to 4 months pending recovery from total knee arthroplasty.  She tolerated the procedure without any complication and after the sheath was pulled pressure was held until hemostasis was obtained.  Contrast: 10 cc   Brandon C. Randie Heinz, MD [facility] Office: [phone] Pager: [phone]     Disposition: Discharge disposition: 01-Home or Self Care       Discharge Instructions     Call MD / Call 911   Complete by: As directed    If you experience chest pain or shortness of breath, CALL 911 and be transported to the hospital emergency room.  If you develope a fever above 101 F, pus (white drainage) or increased drainage or redness at the wound, or calf pain, call your surgeon's office.   Change dressing   Complete by: As directed    Do not remove your dressing.   Constipation Prevention   Complete by: As directed    Drink plenty of fluids.  Prune juice may be helpful.  You may use a stool softener, such as Colace (over the counter) 100 mg twice a day.  Use MiraLax (over the counter) for constipation as needed.   Diet - low sodium heart healthy   Complete by: As directed    Discharge instructions   Complete by: As directed    Elevate toes above nose. Use cryotherapy as needed for pain and swelling.   Do not put a pillow under the knee. Place it under the heel.   Complete by: As directed    Driving restrictions   Complete by: As directed    No driving for 6 weeks   Increase activity slowly as tolerated   Complete by: As directed    Lifting restrictions   Complete by: As directed    No lifting for 6 weeks   Post-operative opioid taper instructions:   Complete by: As directed    POST-OPERATIVE OPIOID TAPER INSTRUCTIONS: It is important to wean off of your opioid medication as soon as possible. If you do not need pain medication after your surgery it is ok to stop day one. Opioids include: Codeine,  Hydrocodone(Norco, Vicodin), Oxycodone(Percocet, oxycontin) and hydromorphone amongst others.  Long term and even short term use of opiods can cause: Increased pain response Dependence Constipation Depression Respiratory depression And more.  Withdrawal symptoms can include Flu like symptoms Nausea, vomiting And more Techniques to manage these symptoms Hydrate well Eat regular healthy meals Stay active Use relaxation techniques(deep breathing, meditating, yoga) Do Not substitute Alcohol to help with tapering If you have been on opioids for less than two weeks and do not have pain than it is ok to stop all together.  Plan to wean off of opioids This plan should start within one week post op of your joint replacement. Maintain the same interval or time between taking each dose and first decrease the dose.  Cut the total daily intake of opioids by one tablet each day Next start to increase the time between doses. The last dose that should be eliminated is the evening dose.      TED hose   Complete by: As directed    Use stockings (TED hose) for 2 weeks on both leg(s).  You may remove them at night for sleeping.   Weight bearing as tolerated   Complete by: As directed         Follow-up Information     Clois Dupes, PA-C. Schedule an appointment as soon as possible for a visit in 2 week(s).   Specialty: Orthopedic Surgery Why: For suture removal, For wound re-check Contact information: [address] [place] [address] [phone]  SignedClois Dupes 03/26/2023, 12:54 PM

## 2023-03-28 DIAGNOSIS — M6281 Muscle weakness (generalized): Secondary | ICD-10-CM | POA: Diagnosis not present

## 2023-03-28 DIAGNOSIS — M25661 Stiffness of right knee, not elsewhere classified: Secondary | ICD-10-CM | POA: Diagnosis not present

## 2023-03-28 DIAGNOSIS — M25561 Pain in right knee: Secondary | ICD-10-CM | POA: Diagnosis not present

## 2023-03-29 ENCOUNTER — Inpatient Hospital Stay: Payer: PPO

## 2023-03-29 ENCOUNTER — Ambulatory Visit: Payer: PPO | Admitting: Medical Oncology

## 2023-03-29 DIAGNOSIS — M25661 Stiffness of right knee, not elsewhere classified: Secondary | ICD-10-CM | POA: Diagnosis not present

## 2023-03-29 DIAGNOSIS — M6281 Muscle weakness (generalized): Secondary | ICD-10-CM | POA: Diagnosis not present

## 2023-03-29 DIAGNOSIS — M25561 Pain in right knee: Secondary | ICD-10-CM | POA: Diagnosis not present

## 2023-04-03 DIAGNOSIS — M25561 Pain in right knee: Secondary | ICD-10-CM | POA: Diagnosis not present

## 2023-04-03 DIAGNOSIS — M25661 Stiffness of right knee, not elsewhere classified: Secondary | ICD-10-CM | POA: Diagnosis not present

## 2023-04-03 DIAGNOSIS — M6281 Muscle weakness (generalized): Secondary | ICD-10-CM | POA: Diagnosis not present

## 2023-04-04 DIAGNOSIS — M25661 Stiffness of right knee, not elsewhere classified: Secondary | ICD-10-CM | POA: Diagnosis not present

## 2023-04-04 DIAGNOSIS — M6281 Muscle weakness (generalized): Secondary | ICD-10-CM | POA: Diagnosis not present

## 2023-04-04 DIAGNOSIS — M25561 Pain in right knee: Secondary | ICD-10-CM | POA: Diagnosis not present

## 2023-04-06 DIAGNOSIS — Z96651 Presence of right artificial knee joint: Secondary | ICD-10-CM | POA: Diagnosis not present

## 2023-04-06 DIAGNOSIS — Z471 Aftercare following joint replacement surgery: Secondary | ICD-10-CM | POA: Diagnosis not present

## 2023-04-06 DIAGNOSIS — M25561 Pain in right knee: Secondary | ICD-10-CM | POA: Diagnosis not present

## 2023-04-06 DIAGNOSIS — M25661 Stiffness of right knee, not elsewhere classified: Secondary | ICD-10-CM | POA: Diagnosis not present

## 2023-04-06 DIAGNOSIS — M6281 Muscle weakness (generalized): Secondary | ICD-10-CM | POA: Diagnosis not present

## 2023-04-09 DIAGNOSIS — M6281 Muscle weakness (generalized): Secondary | ICD-10-CM | POA: Diagnosis not present

## 2023-04-09 DIAGNOSIS — M25561 Pain in right knee: Secondary | ICD-10-CM | POA: Diagnosis not present

## 2023-04-09 DIAGNOSIS — M25661 Stiffness of right knee, not elsewhere classified: Secondary | ICD-10-CM | POA: Diagnosis not present

## 2023-04-12 DIAGNOSIS — M25661 Stiffness of right knee, not elsewhere classified: Secondary | ICD-10-CM | POA: Diagnosis not present

## 2023-04-12 DIAGNOSIS — M6281 Muscle weakness (generalized): Secondary | ICD-10-CM | POA: Diagnosis not present

## 2023-04-12 DIAGNOSIS — M25561 Pain in right knee: Secondary | ICD-10-CM | POA: Diagnosis not present

## 2023-06-06 ENCOUNTER — Ambulatory Visit: Payer: PPO | Admitting: Vascular Surgery

## 2023-06-18 ENCOUNTER — Other Ambulatory Visit: Payer: Self-pay | Admitting: Gastroenterology

## 2023-06-18 DIAGNOSIS — K862 Cyst of pancreas: Secondary | ICD-10-CM

## 2023-06-20 ENCOUNTER — Encounter: Payer: Self-pay | Admitting: Vascular Surgery

## 2023-06-20 ENCOUNTER — Ambulatory Visit (INDEPENDENT_AMBULATORY_CARE_PROVIDER_SITE_OTHER): Payer: PPO | Admitting: Vascular Surgery

## 2023-06-20 VITALS — BP 136/78 | HR 64 | Temp 97.7°F | Ht 63.5 in | Wt 127.0 lb

## 2023-06-20 DIAGNOSIS — Z95828 Presence of other vascular implants and grafts: Secondary | ICD-10-CM

## 2023-06-20 NOTE — Progress Notes (Signed)
 Patient ID: Laurie Green, female   DOB: Jun 26, 1945, 79 y.o.   MRN: [id number]  Reason for Consult: Follow-up   Referred by Camie Patience, FNP  Subjective:     HPI:  Laurie Green is a 78 y.o. female has a history of IVC filter on 2 separate occasions currently maintained on anticoagulation for antiphospholipid antibody syndrome.  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  Most recent filter was placed 4 months ago for right knee revision surgery.  She states that she is still having pain but not plan for any other surgery.  She is also having right hip pain at this time.  States that she has no further planned orthopedic procedures.  Past Medical History:  Diagnosis Date   Anginal pain (HCC)    Anti-cardiolipin antibody syndrome (HCC)    antiphospholipid antibody syndrome   Antiphospholipid antibody with hypercoagulable state (HCC) 11/07/2012   Anxiety    Arthritis    Blood dyscrasia    anticardiolipid syndrome   Blood transfusion    age 45 yr old-none since   Celiac disease    Diabetes mellitus    Type II   Diverticulitis    DVT (deep venous thrombosis) (HCC) 05/01/2011   "to liver"   Family history of adverse reaction to anesthesia    N&V   Fibromyalgia    GERD (gastroesophageal reflux disease)    11/01/16- not problem   H/O seasonal allergies    Heart murmur    "not a problem"   Hypertension    Nausea & vomiting    11-17-14 "nausea and vomiting after meals" at present discussed with Dr. Randa Evens office per pt.   Pneumonia 2021   PONV (postoperative nausea and vomiting)    'I dont have it if they give me something in the IV."   Portal hypertension (HCC)    Stroke (HCC)    tia- eye,   TIA (transient ischemic attack)    Varices 05/01/2011   Varices, esophageal (HCC)    Varices, gastric    Family History  Problem Relation Age of Onset   Cancer Mother    Alzheimer's disease Mother    Cancer Father    Heart defect  Father    Past Surgical History:  Procedure Laterality Date   ABDOMINAL HYSTERECTOMY     ANKLE FRACTURE SURGERY Left    retained hardware   BIOPSY  05/03/2022   Procedure: BIOPSY;  Surgeon: Willis Modena, MD;  Location: [facility] ENDOSCOPY;  Service: Gastroenterology;;   CHOLECYSTECTOMY     COLON SURGERY     hx. diverticulitis   COLONOSCOPY N/A 11/23/2014   Procedure: COLONOSCOPY;  Surgeon: Carman Ching, MD;  Location: [facility]OS[facility];  Service: Endoscopy;  Laterality: N/A;   ESOPHAGOGASTRODUODENOSCOPY  04/28/2011   Procedure: ESOPHAGOGASTRODUODENOSCOPY (EGD);  Surgeon: Vertell Novak., MD;  Location: [facility] ENDOSCOPY;  Service: Endoscopy;  Laterality: N/A;   ESOPHAGOGASTRODUODENOSCOPY N/A 11/23/2014   Procedure: ESOPHAGOGASTRODUODENOSCOPY (EGD);  Surgeon: Carman Ching, MD;  Location: [facility];  Service: Endoscopy;  Laterality: N/A;   ESOPHAGOGASTRODUODENOSCOPY (EGD) WITH PROPOFOL N/A 11/06/2016   Procedure: ESOPHAGOGASTRODUODENOSCOPY (EGD) WITH PROPOFOL;  Surgeon: Carman Ching, MD;  Location: [facility] ENDOSCOPY;  Service: Endoscopy;  Laterality: N/A;   ESOPHAGOGASTRODUODENOSCOPY (EGD) WITH PROPOFOL Bilateral 05/03/2022   Procedure: ESOPHAGOGASTRODUODENOSCOPY (EGD) WITH PROPOFOL;  Surgeon: Willis Modena, MD;  Location: [facility] ENDOSCOPY;  Service: Gastroenterology;  Laterality: Bilateral;   EUS N/A 09/25/2012   Procedure:  ESOPHAGEAL ENDOSCOPIC ULTRASOUND (EUS) RADIAL;  Surgeon: Willis Modena, MD;  Location: [facility] ENDOSCOPY;  Service: Endoscopy;  Laterality: N/A;   HERNIA REPAIR     luq incisional   IVC FILTER INSERTION N/A 07/19/2020   Procedure: IVC FILTER INSERTION;  Surgeon: Maeola Harman, MD;  Location: [facility] INVASIVE CV LAB;  Service: Cardiovascular;  Laterality: N/A;   IVC FILTER INSERTION N/A 03/05/2023   Procedure: IVC FILTER INSERTION;  Surgeon: Maeola Harman, MD;  Location: [facility] INVASIVE CV LAB;  Service: Cardiovascular;  Laterality: N/A;   IVC FILTER REMOVAL N/A 12/12/2021    Procedure: IVC FILTER REMOVAL;  Surgeon: Maeola Harman, MD;  Location: [facility] INVASIVE CV LAB;  Service: Cardiovascular;  Laterality: N/A;   IVC VENOGRAPHY N/A 12/12/2021   Procedure: IVC Venography;  Surgeon: Maeola Harman, MD;  Location: [facility] INVASIVE CV LAB;  Service: Cardiovascular;  Laterality: N/A;   SPLENECTOMY, TOTAL     TONSILLECTOMY     TOTAL KNEE ARTHROPLASTY Right 07/21/2020   Procedure: TOTAL KNEE ARTHROPLASTY;  Surgeon: Jene Every, MD;  Location: [facility] ORS;  Service: Orthopedics;  Laterality: Right;  2.5 hrs   TOTAL KNEE REVISION Right 03/22/2023   Procedure: TOTAL KNEE REVISION;  Surgeon: Samson Frederic, MD;  Location: [facility] ORS;  Service: Orthopedics;  Laterality: Right;    Short Social History:  Social History   Tobacco Use   Smoking status: Never   Smokeless tobacco: Never   Tobacco comments:    never used tobacco  Substance Use Topics   Alcohol use: No    Alcohol/week: 0.0 standard drinks of alcohol    Allergies  Allergen Reactions   Lidocaine Hcl Anaphylaxis   Piperacillin Sod-Tazobactam So Itching   Cyclosporine Other (See Comments)    burns   Hydromorphone Hcl Nausea And Vomiting    Can take with zofran   Morphine And Codeine Nausea And Vomiting   Percocet [Oxycodone-Acetaminophen] Nausea And Vomiting   Tape Itching    Current Outpatient Medications  Medication Sig Dispense Refill   acetaminophen (TYLENOL) 500 MG tablet Take 1,000 mg by mouth every 8 (eight) hours as needed for moderate pain (pain score 4-6).     benazepril (LOTENSIN) 40 MG tablet Take 40 mg by mouth daily.     ELIQUIS 5 MG TABS tablet TAKE 1 TABLET BY MOUTH TWICE A DAY 60 tablet 6   LANTUS SOLOSTAR 100 UNIT/ML Solostar Pen Inject 24 Units into the skin at bedtime. (Patient taking differently: Inject 12 Units into the skin 2 (two) times daily.) 15 mL 1   MAGNESIUM PO Take 2 tablets by mouth daily.     methocarbamol (ROBAXIN) 500 MG tablet Take 1 tablet (500 mg total) by  mouth every 6 (six) hours as needed. 20 tablet 0   Misc Natural Products (BLOOD SUGAR BALANCE) TABS Take 1 tablet by mouth daily as needed (high blood sugar). Glucofort     neomycin-bacitracin-polymyxin (NEOSPORIN) OINT Apply 1 Application topically as needed (dry nose).     ondansetron (ZOFRAN) 4 MG tablet Take 1 tablet (4 mg total) by mouth every 8 (eight) hours as needed for nausea or vomiting. 30 tablet 0   OVER THE COUNTER MEDICATION Take 2 tablets by mouth daily. Beef liver supplement     OVER THE COUNTER MEDICATION Take 1 tablet by mouth daily. Serrapeptase supplement     OVER THE COUNTER MEDICATION Take 5 mLs by mouth daily. C 60 supplement     oxyCODONE (ROXICODONE) 5 MG immediate release tablet Take  1-2 tablets (5-10 mg total) by mouth every 4 (four) hours as needed for severe pain (pain score 7-10). 50 tablet 0   propranolol (INDERAL) 10 MG tablet TAKE 1 TABLET (10 MG TOTAL) BY MOUTH 4 (FOUR) TIMES DAILY. AS NEEDED 360 tablet 2   propranolol ER (INDERAL LA) 80 MG 24 hr capsule Take 1 capsule (80 mg total) by mouth in the morning. 30 capsule 1   VITAMIN E PO Take 1 capsule by mouth daily.     No current facility-administered medications for this visit.    Review of Systems  Constitutional:  Constitutional negative. HENT: HENT negative.  Eyes: Eyes negative.  Respiratory: Respiratory negative.  Cardiovascular: Cardiovascular negative.  GI: Gastrointestinal negative.  Musculoskeletal: Positive for gait problem, leg pain and joint pain.  Hematologic: Hematologic/lymphatic negative.  Psychiatric: Psychiatric negative.        Objective:  Objective   Vitals:   06/20/23 0903  BP: 136/78  Pulse: 64  Temp: 97.7 F (36.5 C)  SpO2: 92%  Weight: 127 lb (57.6 kg)  Height: 5' 3.5" (1.613 m)   Body mass index is 22.14 kg/m.  Physical Exam HENT:     Head: Normocephalic.     Nose: Nose normal.  Eyes:     Pupils: Pupils are equal, round, and reactive to light.   Cardiovascular:     Rate and Rhythm: Normal rate.  Pulmonary:     Effort: Pulmonary effort is normal.  Abdominal:     General: Abdomen is flat.  Musculoskeletal:     Cervical back: Normal range of motion and neck supple.     Right lower leg: No edema.     Left lower leg: No edema.  Skin:    General: Skin is warm.     Capillary Refill: Capillary refill takes less than 2 seconds.  Neurological:     General: No focal deficit present.     Mental Status: She is alert.  Psychiatric:        Mood and Affect: Mood normal.     Data: No studies     Assessment/Plan:     78 year old female with history of antiphospholipid antibody syndrome maintained on chronic anticoagulation for history of multiple DVTs now with IVC filter in place.  She has healed her wounds from her most recent right knee operation but is still having pain and also having right hip pain.  Due to the uncertain nature of the need for future orthopedic procedures I have recommended continuing the filter for now.  We will have a phone call reduction in a few months to discuss possible removal.  If in the interim she elects for further orthopedic procedures we would certainly hold on removal and if there would be no further procedures we can remove the filter at any time.     Maeola Harman MD [facility]

## 2023-06-26 DIAGNOSIS — M1611 Unilateral primary osteoarthritis, right hip: Secondary | ICD-10-CM | POA: Diagnosis not present

## 2023-07-03 ENCOUNTER — Ambulatory Visit
Admission: RE | Admit: 2023-07-03 | Discharge: 2023-07-03 | Disposition: A | Discharge status: Home / Self Care | Source: Ambulatory Visit | Attending: Gastroenterology | Admitting: Gastroenterology

## 2023-07-03 DIAGNOSIS — K862 Cyst of pancreas: Secondary | ICD-10-CM

## 2023-07-13 DIAGNOSIS — M1611 Unilateral primary osteoarthritis, right hip: Secondary | ICD-10-CM | POA: Diagnosis not present

## 2023-07-19 DIAGNOSIS — M25551 Pain in right hip: Secondary | ICD-10-CM | POA: Diagnosis not present

## 2023-08-06 ENCOUNTER — Telehealth: Payer: Self-pay | Admitting: Cardiovascular Disease

## 2023-08-06 NOTE — Telephone Encounter (Signed)
 Patient c/o Palpitations: STAT if patient c/o lightheadedness, shortness of breath, or chest pain  How long have you had palpitations/irregular HR/ Afib? Are you having the symptoms now?  Palpitations, no symptoms currently. She says it just happens every so often. 2 episodes this weekend.  Are you currently experiencing lightheadedness, SOB or CP?  No   Do you have a history of afib (atrial fibrillation) or irregular heart rhythm?  Heart murmur   Have you checked your BP or HR? (document readings if available):  BP is well controlled with medications, per patient. Hasn't checked HR today.   Are you experiencing any other symptoms?  Anxiety, severe pain in knee(s).

## 2023-08-06 NOTE — Telephone Encounter (Signed)
 Spoke with patient and she states over the weekend she had palpitations . She took her propranolol  and it helped. She states she is not sure if its her heart or her anxiety but being that its time for her yearly she would like to get scheduled. She states she was informed Dr. Alroy Aspen dont have an appointment until the middle of summer.     Informed patient Dr. Alroy Aspen is retiring and she can see an APP before that. Being that she will have to find ne cardiologist she will see Dr. Berry Bristol

## 2023-08-07 ENCOUNTER — Encounter: Payer: Self-pay | Admitting: Cardiology

## 2023-08-07 ENCOUNTER — Ambulatory Visit: Attending: Cardiology | Admitting: Cardiology

## 2023-08-07 VITALS — BP 132/90 | HR 67 | Resp 16 | Ht 63.0 in | Wt 120.6 lb

## 2023-08-07 DIAGNOSIS — D6861 Antiphospholipid syndrome: Secondary | ICD-10-CM | POA: Diagnosis not present

## 2023-08-07 DIAGNOSIS — I10 Essential (primary) hypertension: Secondary | ICD-10-CM | POA: Diagnosis not present

## 2023-08-07 DIAGNOSIS — R002 Palpitations: Secondary | ICD-10-CM

## 2023-08-07 MED ORDER — AMLODIPINE BESYLATE 5 MG PO TABS: 5.0000 mg | ORAL_TABLET | Freq: Every day | ORAL | 1 refills | Status: DC

## 2023-08-07 NOTE — Progress Notes (Signed)
 Cardiology Office Note:  .   Date:  08/07/2023  ID:  Laurie Green, DOB 05/28/1945, MRN [id number] PCP: Bufford Carne, FNP  [facility] Providers Cardiologist:  Knox Perl, MD   History of Present Illness: .   Laurie Green is a 78 y.o. Caucasian female patient with history of anticardiolipin antibody positive with multiple DVTs in the past and presently on long-term Eliquis , primary hypercoagulable state and follows Dr. Maria Shiner, primary hypertension, diabetes mellitus, presents to establish care, previously evaluated by Dr. Hoy Mackintosh.  She has had a normal echocardiogram on 08/05/2019 and an event monitor on 03/28/2022 revealing occasional PACs but no other significant arrhythmias.  1 episode about 6 months ago with rapid onset and offset lasting 10 minutes of palpitations.  Since then she is only having occasional episodes of skipped beats and she is concerned about the symptoms.  Discussed the use of AI scribe software for clinical note transcription with the patient, who gave verbal consent to proceed.  History of Present Illness Miss Laurie Green, presents with palpitations and chest discomfort. She describes these episodes as a "real palpitation of the heart", with a sensation of her heart pounding hard. The first episode occurred about a year ago, during which she experienced sudden onset of rapid palpitations, episodes last approximately 10 minutes and then stopped suddenly.  She has not had any further episodes since.  She reports having two episodes this week, which is an increase from her usual frequency of once every two to three months. During these episodes, she experiences a sensation of smothering and can feel the episode coming on. Her husband has noted that her heart seems to be skipping a beat during these episodes.  Symptoms include skipped beats and irregular pulse lasting a few seconds.  She feels chest discomfort and shortness of breath with these episodes and most of  these are occurring at rest and not with exertion or activity.  In addition to her cardiac symptoms, Miss Laurie Green is also dealing with significant pain following a recent knee surgery. This is her second surgery on the same knee, with the first one occurring two years ago and the most recent one in December. She describes the pain as so severe that it sometimes drives her to tears.    Labs   Lab Results  Component Value Date   NA 133 (L) 03/23/2023   K 4.3 03/23/2023   CO2 24 03/23/2023   GLUCOSE 267 (H) 03/23/2023   BUN 28 (H) 03/23/2023   CREATININE 0.93 03/23/2023   CALCIUM 7.9 (L) 03/23/2023   EGFR 72 (L) 09/07/2016   GFRNONAA >60 03/23/2023      Latest Ref Rng & Units 03/23/2023    3:28 AM 03/12/2023    1:33 PM 02/07/2023    1:21 PM  BMP  Glucose 70 - 99 mg/dL 409  811  914   BUN 8 - 23 mg/dL 28  13  13    Creatinine 0.44 - 1.00 mg/dL 7.82  9.56  2.13   Sodium 135 - 145 mmol/L 133  139  141   Potassium 3.5 - 5.1 mmol/L 4.3  3.8  4.1   Chloride 98 - 111 mmol/L 100  104  104   CO2 22 - 32 mmol/L 24  27  30    Calcium 8.9 - 10.3 mg/dL 7.9  9.4  9.9       Latest Ref Rng & Units 03/23/2023    3:28 AM 03/12/2023    1:33  PM 02/07/2023    1:21 PM  CBC  WBC 4.0 - 10.5 K/uL 11.0  6.4  11.8   Hemoglobin 12.0 - 15.0 g/dL 16.1  09.6  04.5   Hematocrit 36.0 - 46.0 % 30.7  40.7  41.6   Platelets 150 - 400 K/uL 210  295  287    Lab Results  Component Value Date   HGBA1C 7.6 (H) 03/12/2023    Lab Results  Component Value Date   TSH 1.908 11/15/2017    External Labs:  [facility] labs 06/06/2023:  A1c 7.5%.  Review of Systems  Cardiovascular:  Positive for palpitations. Negative for chest pain, dyspnea on exertion and leg swelling.   Physical Exam:   VS:  BP (!) 132/90 (BP Location: Left Arm, Patient Position: Sitting, Cuff Size: Normal)   Pulse 67   Resp 16   Ht 5\' 3"  (1.6 m)   SpO2 98%   BMI 22.50 kg/m    Wt Readings from Last 3 Encounters:  06/20/23 127 lb (57.6 kg)   03/22/23 135 lb (61.2 kg)  03/12/23 135 lb (61.2 kg)    Physical Exam Neck:     Vascular: No carotid bruit or JVD.  Cardiovascular:     Rate and Rhythm: Normal rate and regular rhythm.     Pulses: Intact distal pulses.     Heart sounds: Normal heart sounds. No murmur heard.    No gallop.  Pulmonary:     Effort: Pulmonary effort is normal.     Breath sounds: Normal breath sounds.  Abdominal:     General: Bowel sounds are normal.     Palpations: Abdomen is soft.  Musculoskeletal:     Right lower leg: No edema.     Left lower leg: No edema.    Studies Reviewed: Aaron Aas    Event monitor EKG:    EKG Interpretation Date/Time:  Tuesday August 07 2023 11:25:19 EDT Ventricular Rate:  65 PR Interval:  174 QRS Duration:  78 QT Interval:  416 QTC Calculation: 432 R Axis:   -3  Text Interpretation: EKG 08/07/2023: Normal sinus rhythm at rate of 65 bpm, normal axis, no evidence of ischemia.  Low-voltage complexes otherwise normal EKG.  Compared to 02/07/2023, no significant change. Confirmed by Darlette Dubow, Jagadeesh ([id number]) on 08/07/2023 11:47:07 AM    Medications and allergies    Allergies  Allergen Reactions   Lidocaine  Hcl Anaphylaxis   Piperacillin  Sod-Tazobactam So Itching   Cyclosporine Other (See Comments)    burns   Hydromorphone  Hcl Nausea And Vomiting    Can take with zofran    Morphine  And Codeine Nausea And Vomiting   Percocet [Oxycodone -Acetaminophen ] Nausea And Vomiting   Tape Itching     Current Outpatient Medications:    acetaminophen  (TYLENOL ) 500 MG tablet, Take 1,000 mg by mouth every 8 (eight) hours as needed for moderate pain (pain score 4-6)., Disp: , Rfl:    amLODipine  (NORVASC ) 5 MG tablet, Take 1 tablet (5 mg total) by mouth daily., Disp: 90 tablet, Rfl: 1   benazepril  (LOTENSIN ) 40 MG tablet, Take 40 mg by mouth daily., Disp: , Rfl:    ELIQUIS  5 MG TABS tablet, TAKE 1 TABLET BY MOUTH TWICE A DAY, Disp: 60 tablet, Rfl: 6   JARDIANCE 10 MG TABS tablet, Take 10  mg by mouth daily., Disp: , Rfl:    LANTUS  SOLOSTAR 100 UNIT/ML Solostar Pen, Inject 24 Units into the skin at bedtime. (Patient taking differently: Inject 15 Units into the skin daily. 15  units at bedtime), Disp: 15 mL, Rfl: 1   MAGNESIUM  PO, Take 2 tablets by mouth as needed., Disp: , Rfl:    ondansetron  (ZOFRAN ) 4 MG tablet, Take 1 tablet (4 mg total) by mouth every 8 (eight) hours as needed for nausea or vomiting., Disp: 30 tablet, Rfl: 0   oxyCODONE  (ROXICODONE ) 5 MG immediate release tablet, Take 1-2 tablets (5-10 mg total) by mouth every 4 (four) hours as needed for severe pain (pain score 7-10)., Disp: 50 tablet, Rfl: 0   propranolol  (INDERAL ) 10 MG tablet, TAKE 1 TABLET (10 MG TOTAL) BY MOUTH 4 (FOUR) TIMES DAILY. AS NEEDED, Disp: 360 tablet, Rfl: 2   Meds ordered this encounter  Medications   amLODipine  (NORVASC ) 5 MG tablet    Sig: Take 1 tablet (5 mg total) by mouth daily.    Dispense:  90 tablet    Refill:  1     Medications Discontinued During This Encounter  Medication Reason   methocarbamol  (ROBAXIN ) 500 MG tablet Patient Preference   Misc Natural Products (BLOOD SUGAR BALANCE) TABS Patient Preference   OVER THE COUNTER MEDICATION Patient Preference   OVER THE COUNTER MEDICATION Patient Preference   VITAMIN E PO Patient Preference   OVER THE COUNTER MEDICATION Patient Preference   neomycin -bacitracin -polymyxin (NEOSPORIN) OINT Patient Preference   propranolol  ER (INDERAL  LA) 80 MG 24 hr capsule Dose change     ASSESSMENT AND PLAN: .      ICD-10-CM   1. Rapid palpitations  R00.2     2. Palpitations  R00.2 EKG 12-Lead    3. Primary hypertension  I10 amLODipine  (NORVASC ) 5 MG tablet    4. Antiphospholipid antibody with hypercoagulable state (HCC)  D68.61       Assessment and Plan Assessment & Plan Supraventricular tachycardia (SVT)   Episodes of palpitations suggest SVT, marked by a rapid heart rate with sudden onset and offset occurred about 6 months ago is  very suggestive of PSVT. Management includes propranolol  as needed, and she is educated on non-pharmacological interventions for acute episodes counterpressure maneuver.  Continue propranolol  LA 80 mg daily with an additional 10 mg as needed for episodes. Educate on vagal maneuvers, such as leg elevation and Valsalva, for acute episodes. Report any episodes of rapid heart rate lasting 10 minutes or more. She also has symptoms of palpitations that are in the form of skipped beats and I suspect these are PACs and PVCs.  I reviewed her previously worn extended EKG monitor which reveals occasional PACs and PVCs.  For this she can continue to use the beta-blocker as dictated above.  In view of advanced age of 31, she already has resting heart rate in the lower 60s and sometimes even less than 60s as per patient, did not want to increase the propranolol  XL from 80 mg to 220 mg daily.  Primary hypertension   Hypertension presents with occasional elevated readings, likely exacerbated by stress and knee pain. It is managed with propranolol  and lisinopril, with blood pressure occasionally in the 140s. Amlodipine  is added to improve blood pressure control and potentially reduce palpitations. Continue propranolol  LA 80 mg daily and benazepril  40 mg daily. Start amlodipine  5 mg once daily. Monitor blood pressure at home and follow up with the primary care provider for blood pressure management. Monitor for leg swelling and constipation.  I spent a total of 32 minutes in evaluation of the patient, review of prior records, evaluation of labs from PCP and education of the patient and patient's  condition.  Patient felt very reassured.  I will see her back on a as needed basis.  Signed,  Knox Perl, MD, [facility] 08/07/2023, 12:00 PM [facility] [address] [place] [address] [place], [place] [address] Phone: [phone]. Fax:  [phone]

## 2023-08-07 NOTE — Patient Instructions (Signed)
 Medication Instructions:  Start amlodipine  5 mg by mouth daily  *If you need a refill on your cardiac medications before your next appointment, please call your pharmacy*  Lab Work: none If you have labs (blood work) drawn today and your tests are completely normal, you will receive your results only by: MyChart Message (if you have MyChart) OR A paper copy in the mail If you have any lab test that is abnormal or we need to change your treatment, we will call you to review the results.  Testing/Procedures: none  Follow-Up: At [facility], you and your health needs are our priority.  As part of our continuing mission to provide you with exceptional heart care, our providers are all part of one team.  This team includes your primary Cardiologist (physician) and Advanced Practice Providers or APPs (Physician Assistants and Nurse Practitioners) who all work together to provide you with the care you need, when you need it.  Your next appointment:   As needed  Provider:   Knox Perl, MD     We recommend signing up for the patient portal called "MyChart".  Sign up information is provided on this After Visit Summary.  MyChart is used to connect with patients for Virtual Visits (Telemedicine).  Patients are able to view lab/test results, encounter notes, upcoming appointments, etc.  Non-urgent messages can be sent to your provider as well.   To learn more about what you can do with MyChart, go to [email or web address].   Other Instructions       1st Floor: - Lobby - Registration  - Pharmacy  - Lab - Cafe  2nd Floor: - PV Lab - Diagnostic Testing (echo, CT, nuclear med)  3rd Floor: - Vacant  4th Floor: - TCTS (cardiothoracic surgery) - AFib Clinic - Structural Heart Clinic - Vascular Surgery  - Vascular Ultrasound  5th Floor: - HeartCare Cardiology (general and EP) - Clinical Pharmacy for coumadin , hypertension, lipid, weight-loss medications, and med management  appointments    Valet parking services will be available as well.

## 2023-08-11 ENCOUNTER — Other Ambulatory Visit

## 2023-08-13 DIAGNOSIS — M1611 Unilateral primary osteoarthritis, right hip: Secondary | ICD-10-CM | POA: Diagnosis not present

## 2023-08-13 DIAGNOSIS — M5431 Sciatica, right side: Secondary | ICD-10-CM | POA: Diagnosis not present

## 2023-08-15 DIAGNOSIS — L821 Other seborrheic keratosis: Secondary | ICD-10-CM | POA: Diagnosis not present

## 2023-08-15 DIAGNOSIS — L720 Epidermal cyst: Secondary | ICD-10-CM | POA: Diagnosis not present

## 2023-08-15 DIAGNOSIS — C44519 Basal cell carcinoma of skin of other part of trunk: Secondary | ICD-10-CM | POA: Diagnosis not present

## 2023-08-15 DIAGNOSIS — D485 Neoplasm of uncertain behavior of skin: Secondary | ICD-10-CM | POA: Diagnosis not present

## 2023-08-15 DIAGNOSIS — D1809 Hemangioma of other sites: Secondary | ICD-10-CM | POA: Diagnosis not present

## 2023-08-15 DIAGNOSIS — D1801 Hemangioma of skin and subcutaneous tissue: Secondary | ICD-10-CM | POA: Diagnosis not present

## 2023-09-05 DIAGNOSIS — R634 Abnormal weight loss: Secondary | ICD-10-CM | POA: Diagnosis not present

## 2023-09-05 DIAGNOSIS — G4709 Other insomnia: Secondary | ICD-10-CM | POA: Diagnosis not present

## 2023-09-05 DIAGNOSIS — I10 Essential (primary) hypertension: Secondary | ICD-10-CM | POA: Diagnosis not present

## 2023-09-05 DIAGNOSIS — R110 Nausea: Secondary | ICD-10-CM | POA: Diagnosis not present

## 2023-09-05 DIAGNOSIS — E782 Mixed hyperlipidemia: Secondary | ICD-10-CM | POA: Diagnosis not present

## 2023-09-05 DIAGNOSIS — E1165 Type 2 diabetes mellitus with hyperglycemia: Secondary | ICD-10-CM | POA: Diagnosis not present

## 2023-09-06 ENCOUNTER — Other Ambulatory Visit: Payer: Self-pay | Admitting: Family Medicine

## 2023-09-06 DIAGNOSIS — R634 Abnormal weight loss: Secondary | ICD-10-CM

## 2023-09-08 ENCOUNTER — Other Ambulatory Visit

## 2023-09-12 ENCOUNTER — Ambulatory Visit
Admission: RE | Admit: 2023-09-12 | Discharge: 2023-09-12 | Disposition: A | Discharge status: Home / Self Care | Source: Ambulatory Visit | Attending: Family Medicine | Admitting: Family Medicine

## 2023-09-12 DIAGNOSIS — R634 Abnormal weight loss: Secondary | ICD-10-CM

## 2023-09-12 DIAGNOSIS — J9859 Other diseases of mediastinum, not elsewhere classified: Secondary | ICD-10-CM | POA: Diagnosis not present

## 2023-09-12 DIAGNOSIS — K838 Other specified diseases of biliary tract: Secondary | ICD-10-CM | POA: Diagnosis not present

## 2023-09-12 DIAGNOSIS — K862 Cyst of pancreas: Secondary | ICD-10-CM | POA: Diagnosis not present

## 2023-09-12 DIAGNOSIS — I700 Atherosclerosis of aorta: Secondary | ICD-10-CM | POA: Diagnosis not present

## 2023-09-12 DIAGNOSIS — K2289 Other specified disease of esophagus: Secondary | ICD-10-CM | POA: Diagnosis not present

## 2023-09-12 MED ORDER — IOPAMIDOL (ISOVUE-300) INJECTION 61%
100.0000 mL | Freq: Once | INTRAVENOUS | Status: AC | PRN
  Administered 2023-09-12: 100 mL via INTRAVENOUS

## 2023-09-17 DIAGNOSIS — M5459 Other low back pain: Secondary | ICD-10-CM | POA: Diagnosis not present

## 2023-10-03 DIAGNOSIS — M47816 Spondylosis without myelopathy or radiculopathy, lumbar region: Secondary | ICD-10-CM | POA: Diagnosis not present

## 2023-10-03 DIAGNOSIS — M48061 Spinal stenosis, lumbar region without neurogenic claudication: Secondary | ICD-10-CM | POA: Diagnosis not present

## 2023-10-03 DIAGNOSIS — M5136 Other intervertebral disc degeneration, lumbar region with discogenic back pain only: Secondary | ICD-10-CM | POA: Diagnosis not present

## 2023-10-03 DIAGNOSIS — M4807 Spinal stenosis, lumbosacral region: Secondary | ICD-10-CM | POA: Diagnosis not present

## 2023-10-04 DIAGNOSIS — L720 Epidermal cyst: Secondary | ICD-10-CM | POA: Diagnosis not present

## 2023-10-07 ENCOUNTER — Ambulatory Visit
Admission: RE | Admit: 2023-10-07 | Discharge: 2023-10-07 | Disposition: A | Discharge status: Home / Self Care | Source: Ambulatory Visit | Attending: Gastroenterology | Admitting: Gastroenterology

## 2023-10-07 DIAGNOSIS — K862 Cyst of pancreas: Secondary | ICD-10-CM | POA: Diagnosis not present

## 2023-10-07 DIAGNOSIS — K838 Other specified diseases of biliary tract: Secondary | ICD-10-CM | POA: Diagnosis not present

## 2023-10-07 MED ORDER — GADOPICLENOL 0.5 MMOL/ML IV SOLN
5.5000 mL | Freq: Once | INTRAVENOUS | Status: AC | PRN
  Administered 2023-10-07: 5.5 mL via INTRAVENOUS

## 2023-10-09 ENCOUNTER — Other Ambulatory Visit: Payer: Self-pay | Admitting: Hematology & Oncology

## 2023-10-09 DIAGNOSIS — Z96651 Presence of right artificial knee joint: Secondary | ICD-10-CM | POA: Diagnosis not present

## 2023-10-09 DIAGNOSIS — M1711 Unilateral primary osteoarthritis, right knee: Secondary | ICD-10-CM | POA: Diagnosis not present

## 2023-10-10 DIAGNOSIS — C44519 Basal cell carcinoma of skin of other part of trunk: Secondary | ICD-10-CM | POA: Diagnosis not present

## 2023-10-27 DIAGNOSIS — M5416 Radiculopathy, lumbar region: Secondary | ICD-10-CM | POA: Diagnosis not present

## 2023-11-13 DIAGNOSIS — M47816 Spondylosis without myelopathy or radiculopathy, lumbar region: Secondary | ICD-10-CM | POA: Diagnosis not present

## 2023-11-21 DIAGNOSIS — M47816 Spondylosis without myelopathy or radiculopathy, lumbar region: Secondary | ICD-10-CM | POA: Diagnosis not present

## 2023-12-05 DIAGNOSIS — I10 Essential (primary) hypertension: Secondary | ICD-10-CM | POA: Diagnosis not present

## 2023-12-05 DIAGNOSIS — E1165 Type 2 diabetes mellitus with hyperglycemia: Secondary | ICD-10-CM | POA: Diagnosis not present

## 2023-12-05 DIAGNOSIS — R002 Palpitations: Secondary | ICD-10-CM | POA: Diagnosis not present

## 2023-12-05 DIAGNOSIS — M545 Low back pain, unspecified: Secondary | ICD-10-CM | POA: Diagnosis not present

## 2023-12-07 DIAGNOSIS — I10 Essential (primary) hypertension: Secondary | ICD-10-CM | POA: Diagnosis not present

## 2023-12-07 DIAGNOSIS — E1165 Type 2 diabetes mellitus with hyperglycemia: Secondary | ICD-10-CM | POA: Diagnosis not present

## 2023-12-19 ENCOUNTER — Ambulatory Visit: Attending: Vascular Surgery | Admitting: Vascular Surgery

## 2023-12-19 DIAGNOSIS — Z95828 Presence of other vascular implants and grafts: Secondary | ICD-10-CM

## 2023-12-19 DIAGNOSIS — Z86718 Personal history of other venous thrombosis and embolism: Secondary | ICD-10-CM

## 2023-12-19 NOTE — Progress Notes (Signed)
 History of Present Illness: Laurie Green is a 78 y.o. female with history of antiphospholipid antibody syndrome maintained on chronic anticoagulation with Eliquis .  She has an IVC filter in place for recent orthopedic procedures.  She is now planning for nerve ablation.  We are having discussion today for possible IVC filter removal.  Past Medical History:  Diagnosis Date   Anginal pain (HCC)    Anti-cardiolipin antibody syndrome (HCC)    antiphospholipid antibody syndrome   Antiphospholipid antibody with hypercoagulable state (HCC) 11/07/2012   Anxiety    Arthritis    Blood dyscrasia    anticardiolipid syndrome   Blood transfusion    age 14 yr old-none since   Celiac disease    Diabetes mellitus    Type II   Diverticulitis    DVT (deep venous thrombosis) (HCC) 05/01/2011   to liver   Family history of adverse reaction to anesthesia    N&V   Fibromyalgia    GERD (gastroesophageal reflux disease)    11/01/16- not problem   H/O seasonal allergies    Heart murmur    not a problem   Hypertension    Nausea & vomiting    11-17-14 nausea and vomiting after meals at present discussed with Dr. Celestia office per pt.   Pneumonia 2021   PONV (postoperative nausea and vomiting)    'I dont have it if they give me something in the IV.   Portal hypertension (HCC)    Stroke (HCC)    tia- eye,   TIA (transient ischemic attack)    Varices 05/01/2011   Varices, esophageal (HCC)    Varices, gastric     Past Surgical History:  Procedure Laterality Date   ABDOMINAL HYSTERECTOMY     ANKLE FRACTURE SURGERY Left    retained hardware   BIOPSY  05/03/2022   Procedure: BIOPSY;  Surgeon: Burnette Fallow, MD;  Location: [facility];  Service: Gastroenterology;;   CHOLECYSTECTOMY     COLON SURGERY     hx. diverticulitis   COLONOSCOPY N/A 11/23/2014   Procedure: COLONOSCOPY;  Surgeon: Lynwood Celestia, MD;  Location: [facility];  Service: Endoscopy;  Laterality: N/A;    ESOPHAGOGASTRODUODENOSCOPY  04/28/2011   Procedure: ESOPHAGOGASTRODUODENOSCOPY (EGD);  Surgeon: Lynwood LITTIE Celestia Mickey., MD;  Location: [facility] ENDOSCOPY;  Service: Endoscopy;  Laterality: N/A;   ESOPHAGOGASTRODUODENOSCOPY N/A 11/23/2014   Procedure: ESOPHAGOGASTRODUODENOSCOPY (EGD);  Surgeon: Lynwood Celestia, MD;  Location: [facility];  Service: Endoscopy;  Laterality: N/A;   ESOPHAGOGASTRODUODENOSCOPY (EGD) WITH PROPOFOL  N/A 11/06/2016   Procedure: ESOPHAGOGASTRODUODENOSCOPY (EGD) WITH PROPOFOL ;  Surgeon: Celestia Lynwood, MD;  Location: [facility] ENDOSCOPY;  Service: Endoscopy;  Laterality: N/A;   ESOPHAGOGASTRODUODENOSCOPY (EGD) WITH PROPOFOL  Bilateral 05/03/2022   Procedure: ESOPHAGOGASTRODUODENOSCOPY (EGD) WITH PROPOFOL ;  Surgeon: Burnette Fallow, MD;  Location: [facility];  Service: Gastroenterology;  Laterality: Bilateral;   EUS N/A 09/25/2012   Procedure: ESOPHAGEAL ENDOSCOPIC ULTRASOUND (EUS) RADIAL;  Surgeon: Fallow Burnette, MD;  Location: [facility];  Service: Endoscopy;  Laterality: N/A;   HERNIA REPAIR     luq incisional   IVC FILTER INSERTION N/A 07/19/2020   Procedure: IVC FILTER INSERTION;  Surgeon: Green Penne Bruckner, MD;  Location: [facility] INVASIVE CV LAB;  Service: Cardiovascular;  Laterality: N/A;   IVC FILTER INSERTION N/A 03/05/2023   Procedure: IVC FILTER INSERTION;  Surgeon: Green Penne Bruckner, MD;  Location: [facility] INVASIVE CV LAB;  Service: Cardiovascular;  Laterality: N/A;   IVC FILTER REMOVAL N/A 12/12/2021   Procedure: IVC FILTER REMOVAL;  Surgeon: Green Penne Bruckner, MD;  Location: [facility] INVASIVE CV LAB;  Service: Cardiovascular;  Laterality: N/A;   IVC VENOGRAPHY N/A 12/12/2021   Procedure: IVC Venography;  Surgeon: Green Penne Bruckner, MD;  Location: [facility] INVASIVE CV LAB;  Service: Cardiovascular;  Laterality: N/A;   SPLENECTOMY, TOTAL     TONSILLECTOMY     TOTAL KNEE ARTHROPLASTY Right 07/21/2020   Procedure: TOTAL KNEE ARTHROPLASTY;  Surgeon: Duwayne Purchase, MD;   Location: [facility];  Service: Orthopedics;  Laterality: Right;  2.5 hrs   TOTAL KNEE REVISION Right 03/22/2023   Procedure: TOTAL KNEE REVISION;  Surgeon: Fidel Rogue, MD;  Location: [facility];  Service: Orthopedics;  Laterality: Right;    No outpatient medications have been marked as taking for the 12/19/23 encounter (Appointment) with Green Penne Bruckner, MD.    12 system ROS was negative unless otherwise noted in HPI   Observations/Objective: She demonstrates good understanding of our conversation  Assessment and Plan: 78 year old female with history of IVC filter placement maintained on chronic anticoagulation.  We will plan for removal when all of her orthopedic and neurologic procedures have been completed and she is back at her baseline health.  She will call for removal at that time and can otherwise follow-up with me on an as-needed basis.    Laurie Kjos C. Sheree, MD [facility] Office: [phone] Pager: [phone]  12/19/2023, 4:07 PM

## 2023-12-20 ENCOUNTER — Telehealth: Payer: Self-pay

## 2023-12-20 NOTE — Telephone Encounter (Signed)
 Per [facility], patient will call when ready for IVC removal.

## 2023-12-28 DIAGNOSIS — M47816 Spondylosis without myelopathy or radiculopathy, lumbar region: Secondary | ICD-10-CM | POA: Diagnosis not present

## 2024-01-02 ENCOUNTER — Other Ambulatory Visit (HOSPITAL_COMMUNITY): Payer: Self-pay | Admitting: Student

## 2024-01-02 DIAGNOSIS — I8500 Esophageal varices without bleeding: Secondary | ICD-10-CM | POA: Diagnosis not present

## 2024-01-02 DIAGNOSIS — R140 Abdominal distension (gaseous): Secondary | ICD-10-CM | POA: Diagnosis not present

## 2024-01-02 DIAGNOSIS — R6881 Early satiety: Secondary | ICD-10-CM | POA: Diagnosis not present

## 2024-01-02 DIAGNOSIS — K7460 Unspecified cirrhosis of liver: Secondary | ICD-10-CM | POA: Diagnosis not present

## 2024-01-02 DIAGNOSIS — K862 Cyst of pancreas: Secondary | ICD-10-CM | POA: Diagnosis not present

## 2024-01-02 DIAGNOSIS — R110 Nausea: Secondary | ICD-10-CM

## 2024-01-02 DIAGNOSIS — D6861 Antiphospholipid syndrome: Secondary | ICD-10-CM | POA: Diagnosis not present

## 2024-01-02 DIAGNOSIS — K5900 Constipation, unspecified: Secondary | ICD-10-CM | POA: Diagnosis not present

## 2024-01-02 DIAGNOSIS — R109 Unspecified abdominal pain: Secondary | ICD-10-CM | POA: Diagnosis not present

## 2024-01-07 DIAGNOSIS — R928 Other abnormal and inconclusive findings on diagnostic imaging of breast: Secondary | ICD-10-CM | POA: Diagnosis not present

## 2024-01-07 DIAGNOSIS — N6315 Unspecified lump in the right breast, overlapping quadrants: Secondary | ICD-10-CM | POA: Diagnosis not present

## 2024-01-07 LAB — HM MAMMOGRAPHY

## 2024-01-08 ENCOUNTER — Encounter (HOSPITAL_COMMUNITY)
Admission: RE | Admit: 2024-01-08 | Discharge: 2024-01-08 | Disposition: A | Discharge status: Home / Self Care | Source: Ambulatory Visit | Attending: Student | Admitting: Student

## 2024-01-08 DIAGNOSIS — R6881 Early satiety: Secondary | ICD-10-CM | POA: Insufficient documentation

## 2024-01-08 DIAGNOSIS — R110 Nausea: Secondary | ICD-10-CM | POA: Insufficient documentation

## 2024-01-08 MED ORDER — TECHNETIUM TC 99M MEDRONATE IV KIT
20.0000 | PACK | Freq: Once | INTRAVENOUS | Status: DC | PRN
Start: 2024-01-08 — End: 2024-01-08

## 2024-01-08 MED ORDER — TECHNETIUM TC 99M SULFUR COLLOID
2.0000 | Freq: Once | INTRAVENOUS | Status: AC
  Administered 2024-01-08: 2.13 via ORAL

## 2024-01-11 ENCOUNTER — Encounter: Payer: Self-pay | Admitting: Hematology & Oncology

## 2024-01-15 DIAGNOSIS — K862 Cyst of pancreas: Secondary | ICD-10-CM | POA: Diagnosis not present

## 2024-01-15 DIAGNOSIS — Z1211 Encounter for screening for malignant neoplasm of colon: Secondary | ICD-10-CM | POA: Diagnosis not present

## 2024-01-15 DIAGNOSIS — R6881 Early satiety: Secondary | ICD-10-CM | POA: Diagnosis not present

## 2024-01-15 DIAGNOSIS — I8500 Esophageal varices without bleeding: Secondary | ICD-10-CM | POA: Diagnosis not present

## 2024-01-15 DIAGNOSIS — R140 Abdominal distension (gaseous): Secondary | ICD-10-CM | POA: Diagnosis not present

## 2024-01-15 DIAGNOSIS — K7460 Unspecified cirrhosis of liver: Secondary | ICD-10-CM | POA: Diagnosis not present

## 2024-01-15 DIAGNOSIS — K5900 Constipation, unspecified: Secondary | ICD-10-CM | POA: Diagnosis not present

## 2024-02-11 ENCOUNTER — Telehealth: Payer: Self-pay

## 2024-02-11 NOTE — Telephone Encounter (Signed)
 Per [facility], patient will call when ready to schedule IVC filter removal.  Paperwork sent to scanning 10/27.

## 2024-02-26 DIAGNOSIS — M5416 Radiculopathy, lumbar region: Secondary | ICD-10-CM | POA: Diagnosis not present

## 2024-02-26 DIAGNOSIS — M5441 Lumbago with sciatica, right side: Secondary | ICD-10-CM | POA: Diagnosis not present

## 2024-02-26 DIAGNOSIS — M25551 Pain in right hip: Secondary | ICD-10-CM | POA: Diagnosis not present

## 2024-03-05 DIAGNOSIS — I10 Essential (primary) hypertension: Secondary | ICD-10-CM | POA: Diagnosis not present

## 2024-03-05 DIAGNOSIS — E782 Mixed hyperlipidemia: Secondary | ICD-10-CM | POA: Diagnosis not present

## 2024-03-05 DIAGNOSIS — E1165 Type 2 diabetes mellitus with hyperglycemia: Secondary | ICD-10-CM | POA: Diagnosis not present

## 2024-03-21 ENCOUNTER — Encounter: Payer: Self-pay | Admitting: Cardiology

## 2024-03-21 ENCOUNTER — Ambulatory Visit: Attending: Cardiology

## 2024-03-21 ENCOUNTER — Ambulatory Visit: Attending: Cardiology | Admitting: Cardiology

## 2024-03-21 VITALS — BP 136/76 | HR 64 | Ht 63.5 in | Wt 129.0 lb

## 2024-03-21 DIAGNOSIS — R002 Palpitations: Secondary | ICD-10-CM

## 2024-03-21 NOTE — Progress Notes (Signed)
 Clinical Summary Laurie Green is a 78 y.o.female former patient of Dr Alveta, most recently seen by Dr Ladona. This is our first visit together, seen today for the following medical problems.  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 Has had IVC filter managed by vascular  2. Palpitations - 03/2022 monitor: NSR, rare PACs - has been on proprnolol prn for symptoms  - increasing palpitations - no specific trigger. Feels heaviness in chest, smothering. Can feel like heart is racing. Will check home heart rate, once episode up 195. Typically last about 5 minutes. Episode occur several times day, at worst worst 2-3 per day. Will take propanolol prn.  - decaf tea, decaf sodas, rare coffee, no energy drinks, no EtoH    3.HTN - home numbers typically 110s-120/60s-70s    Past Medical History:  Diagnosis Date   Anginal pain    Anti-cardiolipin antibody syndrome    antiphospholipid antibody syndrome   Antiphospholipid antibody with hypercoagulable state 11/07/2012   Anxiety    Arthritis    Blood dyscrasia    anticardiolipid syndrome   Blood transfusion    age 48 yr old-none since   Celiac disease    Diabetes mellitus    Type II   Diverticulitis    DVT (deep venous thrombosis) (HCC) 05/01/2011   to liver   Family history of adverse reaction to anesthesia    N&V   Fibromyalgia    GERD (gastroesophageal reflux disease)    11/01/16- not problem   H/O seasonal allergies    Heart murmur    not a problem   Hypertension    Nausea & vomiting    11-17-14 nausea and vomiting after meals at present discussed with Dr. Celestia office per pt.   Pneumonia 2021   PONV (postoperative nausea and vomiting)    'I dont have it if they give me something in the IV.   Portal hypertension (HCC)    Stroke (HCC)    tia- eye,   TIA (transient ischemic attack)    Varices 05/01/2011    Varices, esophageal (HCC)    Varices, gastric      Allergies  Allergen Reactions   Lidocaine  Hcl Anaphylaxis   Piperacillin  Sod-Tazobactam So Itching   Cyclosporine Other (See Comments)    burns   Hydromorphone  Hcl Nausea And Vomiting    Can take with zofran    Morphine  And Codeine Nausea And Vomiting   Percocet [Oxycodone -Acetaminophen ] Nausea And Vomiting   Tape Itching     Current Outpatient Medications  Medication Sig Dispense Refill   acetaminophen  (TYLENOL ) 500 MG tablet Take 1,000 mg by mouth every 8 (eight) hours as needed for moderate pain (pain score 4-6).     amLODipine  (NORVASC ) 5 MG tablet Take 1 tablet (5 mg total) by mouth daily. 90 tablet 1   benazepril  (LOTENSIN ) 40 MG tablet Take 40 mg by mouth daily.     ELIQUIS  5 MG TABS tablet TAKE 1 TABLET BY MOUTH TWICE A DAY 60 tablet 6   JARDIANCE 10 MG TABS tablet Take 10 mg by mouth daily.     LANTUS  SOLOSTAR 100 UNIT/ML Solostar Pen Inject 24 Units into the skin at bedtime. (Patient taking differently: Inject 15 Units into the skin daily. 15 units at bedtime) 15 mL 1   MAGNESIUM  PO Take 2 tablets by mouth as needed.     ondansetron  (ZOFRAN ) 4 MG tablet  Take 1 tablet (4 mg total) by mouth every 8 (eight) hours as needed for nausea or vomiting. 30 tablet 0   oxyCODONE  (ROXICODONE ) 5 MG immediate release tablet Take 1-2 tablets (5-10 mg total) by mouth every 4 (four) hours as needed for severe pain (pain score 7-10). 50 tablet 0   propranolol  (INDERAL ) 10 MG tablet TAKE 1 TABLET (10 MG TOTAL) BY MOUTH 4 (FOUR) TIMES DAILY. AS NEEDED 360 tablet 2   No current facility-administered medications for this visit.     Past Surgical History:  Procedure Laterality Date   ABDOMINAL HYSTERECTOMY     ANKLE FRACTURE SURGERY Left    retained hardware   BIOPSY  05/03/2022   Procedure: BIOPSY;  Surgeon: Burnette Fallow, MD;  [facility]: [facility];  Service: Gastroenterology;;   CHOLECYSTECTOMY     COLON SURGERY     hx.  diverticulitis   COLONOSCOPY N/A 11/23/2014   Procedure: COLONOSCOPY;  Surgeon: Lynwood Bohr, MD;  Location: [facility];  Service: Endoscopy;  Laterality: N/A;   ESOPHAGOGASTRODUODENOSCOPY  04/28/2011   Procedure: ESOPHAGOGASTRODUODENOSCOPY (EGD);  Surgeon: Lynwood LITTIE Bohr Mickey., MD;  Location: [facility] ENDOSCOPY;  Service: Endoscopy;  Laterality: N/A;   ESOPHAGOGASTRODUODENOSCOPY N/A 11/23/2014   Procedure: ESOPHAGOGASTRODUODENOSCOPY (EGD);  Surgeon: Lynwood Bohr, MD;  Location: [facility];  Service: Endoscopy;  Laterality: N/A;   ESOPHAGOGASTRODUODENOSCOPY (EGD) WITH PROPOFOL  N/A 11/06/2016   Procedure: ESOPHAGOGASTRODUODENOSCOPY (EGD) WITH PROPOFOL ;  Surgeon: Bohr Lynwood, MD;  Location: [facility] ENDOSCOPY;  Service: Endoscopy;  Laterality: N/A;   ESOPHAGOGASTRODUODENOSCOPY (EGD) WITH PROPOFOL  Bilateral 05/03/2022   Procedure: ESOPHAGOGASTRODUODENOSCOPY (EGD) WITH PROPOFOL ;  Surgeon: Burnette Fallow, MD;  Location: [facility];  Service: Gastroenterology;  Laterality: Bilateral;   EUS N/A 09/25/2012   Procedure: ESOPHAGEAL ENDOSCOPIC ULTRASOUND (EUS) RADIAL;  Surgeon: Fallow Burnette, MD;  Location: [facility];  Service: Endoscopy;  Laterality: N/A;   HERNIA REPAIR     luq incisional   IVC FILTER INSERTION N/A 07/19/2020   Procedure: IVC FILTER INSERTION;  Surgeon: Sheree Penne Bruckner, MD;  Location: [facility] INVASIVE CV LAB;  Service: Cardiovascular;  Laterality: N/A;   IVC FILTER INSERTION N/A 03/05/2023   Procedure: IVC FILTER INSERTION;  Surgeon: Sheree Penne Bruckner, MD;  Location: [facility] INVASIVE CV LAB;  Service: Cardiovascular;  Laterality: N/A;   IVC FILTER REMOVAL N/A 12/12/2021   Procedure: IVC FILTER REMOVAL;  Surgeon: Sheree Penne Bruckner, MD;  Location: [facility] INVASIVE CV LAB;  Service: Cardiovascular;  Laterality: N/A;   IVC VENOGRAPHY N/A 12/12/2021   Procedure: IVC Venography;  Surgeon: Sheree Penne Bruckner, MD;  Location: [facility] INVASIVE CV LAB;  Service: Cardiovascular;  Laterality:  N/A;   SPLENECTOMY, TOTAL     TONSILLECTOMY     TOTAL KNEE ARTHROPLASTY Right 07/21/2020   Procedure: TOTAL KNEE ARTHROPLASTY;  Surgeon: Duwayne Purchase, MD;  Location: [facility];  Service: Orthopedics;  Laterality: Right;  2.5 hrs   TOTAL KNEE REVISION Right 03/22/2023   Procedure: TOTAL KNEE REVISION;  Surgeon: Fidel Rogue, MD;  Location: [facility];  Service: Orthopedics;  Laterality: Right;     Allergies  Allergen Reactions   Lidocaine  Hcl Anaphylaxis   Piperacillin  Sod-Tazobactam So Itching   Cyclosporine Other (See Comments)    burns   Hydromorphone  Hcl Nausea And Vomiting    Can take with zofran    Morphine  And Codeine Nausea And Vomiting   Percocet [Oxycodone -Acetaminophen ] Nausea And Vomiting   Tape Itching      Family History  Problem Relation Age of Onset   Cancer Mother  Alzheimer's disease Mother    Cancer Father    Heart defect Father      Social History Ms. Halliday reports that she has never smoked. She has never used smokeless tobacco. Ms. Szumski reports no history of alcohol use.     Physical Examination Today's Vitals   03/21/24 1013  BP: 136/76  Pulse: 64  SpO2: 97%  Weight: 129 lb (58.5 kg)  Height: 5' 3.5 (1.613 m)   Body mass index is 22.49 kg/m.  Gen: resting comfortably, no acute distress HEENT: no scleral icterus, pupils equal round and reactive, no palptable cervical adenopathy,  CV: RRR, no mrg, no jvd Resp: Clear to auscultation bilaterally GI: abdomen is soft, non-tender, non-distended, normal bowel sounds, no hepatosplenomegaly MSK: extremities are warm, no edema.  Skin: warm, no rash Neuro:  no focal deficits Psych: appropriate affect     Assessment and Plan   1.Palpitations - progressing over time, quite a significant progression since her 2023 monitor - will repeat monitor, pending results may consider transitioning to metoprol from propranolol . Currently taking propranolol  80mg  LA and 10mg  short acting prn.  - issues  with constipation prior GI workup she reports, from review propranolol  only 1-3% risk as side effect, she has also been on several years. Less likely but may be another reason to consider metoprolol, reported just 1% or less risk of constipation    F/u 4 months   Dorn PHEBE Ross, M.D.,

## 2024-03-21 NOTE — Patient Instructions (Signed)
 Medication Instructions:   Your physician recommends that you continue on your current medications as directed. Please refer to the Current Medication list given to you today.   Labwork: None today  Testing/Procedures: ZIO XT- Long Term Monitor Instructions   Your physician has requested you wear your ZIO patch monitor_____7__days.   This is a single patch monitor.  [employer] supplies one patch monitor per enrollment.  Additional stickers are not available.   Please do not apply patch if you will be having a Nuclear Stress Test, Echocardiogram, Cardiac CT, MRI, or Chest Xray during the time frame you would be wearing the monitor. The patch cannot be worn during these tests.  You cannot remove and re-apply the ZIO XT patch monitor.   Your ZIO patch monitor will be sent USPS Priority mail from [facility] directly to your home address. The monitor may also be mailed to a PO BOX if home delivery is not available.   It may take 3-5 days to receive your monitor after you have been enrolled.   Once you have received you monitor, please review enclosed instructions.  Your monitor has already been registered assigning a specific monitor serial # to you.   Applying the monitor   Shave hair from upper left chest.   Hold abrader disc by orange tab.  Rub abrader in 40 strokes over left upper chest as indicated in your monitor instructions.   Clean area with 4 enclosed alcohol pads .  Use all pads to assure are is cleaned thoroughly.  Let dry.   Apply patch as indicated in monitor instructions.  Patch will be place under collarbone on left side of chest with arrow pointing upward.   Rub patch adhesive wings for 2 minutes.Remove white label marked 1.  Remove white label marked 2.  Rub patch adhesive wings for 2 additional minutes.   While looking in a mirror, press and release button in center of patch.  A small green light will flash 3-4 times .  This will be your only indicator the  monitor has been turned on.     Do not shower for the first 24 hours.  You may shower after the first 24 hours.   Press button if you feel a symptom. You will hear a small click.  Record Date, Time and Symptom in the Patient Log Book.   When you are ready to remove patch, follow instructions on last 2 pages of Patient Log Book.  Stick patch monitor onto last page of Patient Log Book.   Place Patient Log Book in [place] box.  Use locking tab on box and tape box closed securely.  The [employer] has [employer] on it.  Please place in mailbox as soon as possible.  Your physician should have your test results approximately 7 days after the monitor has been mailed back to [facility].   Call [facility] Customer Care at [phone] if you have questions regarding your ZIO XT patch monitor.  Call them immediately if you see an orange light blinking on your monitor.   If your monitor falls off in less than 4 days contact our Monitor department at [phone].  If your monitor becomes loose or falls off after 4 days call [employer] at [phone] for suggestions on securing your monitor.    Follow-Up: 4 months with Dr.Btranch, B.Strader,PA-C, or S.Sheron, PA-C  Any Other Special Instructions Will Be Listed Below (If Applicable).  If you need a refill on your cardiac medications  before your next appointment, please call your pharmacy.

## 2024-04-21 ENCOUNTER — Telehealth: Payer: Self-pay | Admitting: Cardiology

## 2024-04-21 NOTE — Telephone Encounter (Signed)
 Message sent to Dr.Branch to read study.

## 2024-04-21 NOTE — Telephone Encounter (Signed)
 PT is calling for ZIO Monitor results

## 2024-04-25 ENCOUNTER — Ambulatory Visit: Payer: Self-pay | Admitting: Cardiology

## 2024-04-25 DIAGNOSIS — R002 Palpitations: Secondary | ICD-10-CM

## 2024-04-25 MED ORDER — METOPROLOL TARTRATE 25 MG PO TABS: 25.0000 mg | ORAL_TABLET | Freq: Two times a day (BID) | ORAL | 3 refills | Status: DC

## 2024-04-25 NOTE — Telephone Encounter (Signed)
-----   Message from Dorn Ross, MD sent at 04/25/2024  4:41 PM EST ----- Monitor did show some extra heart beats at times and runs of a abnormal rhythm called SVT. This is not a dangerous rhythm but can cause symptoms of palpitations. Please stop the propranolol  and start  lopressor  25mg  bid. Update us  on symptoms in 1 week, can titrate the metoprol if needed.

## 2024-05-01 ENCOUNTER — Other Ambulatory Visit: Payer: Self-pay | Admitting: Hematology & Oncology

## 2024-05-05 ENCOUNTER — Telehealth: Payer: Self-pay | Admitting: Cardiology

## 2024-05-05 NOTE — Telephone Encounter (Signed)
 Spoke to patient who stated that since Saturday, she has been having episodes of constant palpitations. Pt stated that she feels like her heart has also been skipping beats. Pt stated that she only started Lopressor  5 days ago but felt like it did not agree with her- she stopped taking Lopressor  and restarted Propanolol. Pt stated she feels better since restarting. Pt gave pulse readings of 67 bpm and 49 bpm. Pt did not having any other readings.   Please advise.

## 2024-05-05 NOTE — Telephone Encounter (Signed)
 Patient c/o Palpitations: STAT if patient c/o lightheadedness, shortness of breath, or chest pain  How long have you had palpitations/irregular HR/ Afib? Are you having the symptoms now? Since saturday   Are you currently experiencing lightheadedness, SOB or CP? Not right now   Do you have a history of afib (atrial fibrillation) or irregular heart rhythm? No  Have you checked your BP or HR? (document readings if available): BP:111/60  Are you experiencing any other symptoms? No

## 2024-05-06 ENCOUNTER — Other Ambulatory Visit: Payer: Self-pay

## 2024-05-06 DIAGNOSIS — I471 Supraventricular tachycardia, unspecified: Secondary | ICD-10-CM

## 2024-05-06 MED ORDER — PROPRANOLOL HCL 10 MG PO TABS: 10.0000 mg | ORAL_TABLET | Freq: Every day | ORAL | Status: AC | PRN

## 2024-05-06 MED ORDER — PROPRANOLOL HCL ER 80 MG PO CP24: 80.0000 mg | ORAL_CAPSULE | Freq: Every day | ORAL | Status: AC

## 2024-05-06 NOTE — Telephone Encounter (Signed)
 Ok to stop lopressor  and go back on propranolol . Trickier to control heart rates because her heart rates are low normal and higher doses of propranolol  may cause heart rates to get too low. Would continue the propranolol  daily and prn doses for now, can we refer her to EP for SVT please  JINNY Ross MD

## 2024-05-06 NOTE — Telephone Encounter (Signed)
 Patient notified. EP referral sent.   Will route to schedulers

## 2024-05-10 ENCOUNTER — Telehealth: Payer: Self-pay | Admitting: Physician Assistant

## 2024-05-10 NOTE — Telephone Encounter (Signed)
" ° °  Called patient in response to a page regarding heart palpitations and low blood pressure. Patient reports HR of 157 bpm and low BP at the time, though she is unable to recall the exact BP reading. She experienced headache, weakness, palpitations, and chest tightness, similar to prior SVT episodes. She had already taken her daily propranolol  80 mg. She then took an additional propranolol  10 mg, and approximately one hour later her symptoms resolved and vital signs normalized. She denies any known triggers.  Patient reports that these episodes feel as though they are worsening and lasting longer, and she is concerned she may be having a heart attack. She would like to follow up on her EP referral. Chart review shows that the EP referral was placed on 05/06/2024, but no appointment has been scheduled yet.  Symptoms appear consistent with prior episodes but of longer duration. Patient was advised to monitor vital signs and symptoms. If symptoms recur, she was instructed to contact the office. Emergency department precautions were discussed.  Signed, Lorette CINDERELLA Kapur, PA-C 05/10/2024, 3:18 PM Pager: [phone]  "

## 2024-06-06 ENCOUNTER — Ambulatory Visit: Admitting: Student in an Organized Health Care Education/Training Program

## 2024-08-20 ENCOUNTER — Ambulatory Visit: Admitting: Student
